# Patient Record
Sex: Male | Born: 1945 | Race: Black or African American | Hispanic: No | Marital: Married | Smoking: Never smoker
Health system: Southern US, Community
[De-identification: ages and names within clinical notes are randomized; demographics above are authoritative.]

## PROBLEM LIST (undated history)

## (undated) DIAGNOSIS — E785 Hyperlipidemia, unspecified: Secondary | ICD-10-CM

## (undated) DIAGNOSIS — C61 Malignant neoplasm of prostate: Secondary | ICD-10-CM

## (undated) DIAGNOSIS — I10 Essential (primary) hypertension: Secondary | ICD-10-CM

## (undated) HISTORY — PX: UMBILICAL HERNIA REPAIR: SHX196

## (undated) HISTORY — PX: COLONOSCOPY: SHX174

## (undated) HISTORY — DX: Hyperlipidemia, unspecified: E78.5

## (undated) HISTORY — DX: Malignant neoplasm of prostate: C61

## (undated) HISTORY — DX: Essential (primary) hypertension: I10

## (undated) HISTORY — PX: INGUINAL HERNIA REPAIR: SUR1180

---

## 1998-05-25 ENCOUNTER — Encounter: Admission: RE | Admit: 1998-05-25 | Discharge: 1998-05-25 | Payer: Self-pay | Admitting: *Deleted

## 2002-11-17 ENCOUNTER — Encounter: Payer: Self-pay | Admitting: Emergency Medicine

## 2002-11-17 ENCOUNTER — Emergency Department (HOSPITAL_COMMUNITY): Admission: EM | Admit: 2002-11-17 | Discharge: 2002-11-17 | Payer: Self-pay | Admitting: Emergency Medicine

## 2004-10-12 ENCOUNTER — Ambulatory Visit: Payer: Self-pay | Admitting: Internal Medicine

## 2004-11-05 ENCOUNTER — Ambulatory Visit: Payer: Self-pay | Admitting: Internal Medicine

## 2004-11-18 ENCOUNTER — Ambulatory Visit: Payer: Self-pay | Admitting: Internal Medicine

## 2004-11-24 ENCOUNTER — Ambulatory Visit: Payer: Self-pay | Admitting: Internal Medicine

## 2004-12-04 ENCOUNTER — Ambulatory Visit (HOSPITAL_COMMUNITY): Admission: RE | Admit: 2004-12-04 | Discharge: 2004-12-04 | Payer: Self-pay | Admitting: Emergency Medicine

## 2004-12-07 ENCOUNTER — Ambulatory Visit: Payer: Self-pay | Admitting: Internal Medicine

## 2004-12-24 ENCOUNTER — Ambulatory Visit: Payer: Self-pay | Admitting: Internal Medicine

## 2005-05-02 ENCOUNTER — Ambulatory Visit: Payer: Self-pay | Admitting: Internal Medicine

## 2005-05-16 ENCOUNTER — Ambulatory Visit: Payer: Self-pay | Admitting: Internal Medicine

## 2005-08-09 ENCOUNTER — Ambulatory Visit: Payer: Self-pay | Admitting: Internal Medicine

## 2005-09-19 ENCOUNTER — Ambulatory Visit: Payer: Self-pay | Admitting: Internal Medicine

## 2005-11-11 ENCOUNTER — Ambulatory Visit: Payer: Self-pay | Admitting: Internal Medicine

## 2005-11-21 ENCOUNTER — Ambulatory Visit: Payer: Self-pay | Admitting: Internal Medicine

## 2005-12-12 DIAGNOSIS — J9601 Acute respiratory failure with hypoxia: Secondary | ICD-10-CM | POA: Diagnosis present

## 2005-12-12 DIAGNOSIS — C61 Malignant neoplasm of prostate: Secondary | ICD-10-CM

## 2005-12-12 HISTORY — DX: Malignant neoplasm of prostate: C61

## 2006-01-17 ENCOUNTER — Ambulatory Visit: Payer: Self-pay | Admitting: Internal Medicine

## 2006-01-31 ENCOUNTER — Ambulatory Visit: Payer: Self-pay | Admitting: Internal Medicine

## 2006-02-01 ENCOUNTER — Ambulatory Visit (HOSPITAL_COMMUNITY): Admission: RE | Admit: 2006-02-01 | Discharge: 2006-02-01 | Payer: Self-pay | Admitting: Urology

## 2006-04-11 HISTORY — PX: PROSTATECTOMY: SHX69

## 2006-04-17 ENCOUNTER — Ambulatory Visit: Payer: Self-pay | Admitting: Internal Medicine

## 2006-06-07 ENCOUNTER — Encounter (INDEPENDENT_AMBULATORY_CARE_PROVIDER_SITE_OTHER): Payer: Self-pay | Admitting: *Deleted

## 2006-06-07 ENCOUNTER — Inpatient Hospital Stay (HOSPITAL_COMMUNITY): Admission: RE | Admit: 2006-06-07 | Discharge: 2006-06-08 | Payer: Self-pay | Admitting: Urology

## 2006-08-31 ENCOUNTER — Ambulatory Visit: Payer: Self-pay | Admitting: Internal Medicine

## 2006-09-12 ENCOUNTER — Ambulatory Visit: Payer: Self-pay | Admitting: Internal Medicine

## 2006-11-07 ENCOUNTER — Ambulatory Visit: Payer: Self-pay | Admitting: Internal Medicine

## 2006-12-27 ENCOUNTER — Ambulatory Visit: Payer: Self-pay | Admitting: Internal Medicine

## 2006-12-27 LAB — CONVERTED CEMR LAB
ALT: 25 units/L (ref 0–40)
AST: 23 units/L (ref 0–37)
BUN: 18 mg/dL (ref 6–23)
Cholesterol: 173 mg/dL (ref 0–200)
Creatinine, Ser: 1.4 mg/dL (ref 0.4–1.5)
Creatinine,U: 247.7 mg/dL
HDL: 43 mg/dL (ref >39.0)
Hgb A1c MFr Bld: 6.5 % — ABNORMAL HIGH (ref 4.6–6.0)
LDL Cholesterol: 120 mg/dL — ABNORMAL HIGH (ref 0–99)
Microalb Creat Ratio: 1.6 mg/g (ref 0.0–30.0)
Microalb, Ur: 0.4 mg/dL (ref 0.0–1.9)
Potassium: 4 meq/L (ref 3.5–5.1)
Total CHOL/HDL Ratio: 4
Triglycerides: 51 mg/dL (ref 0–149)
VLDL: 10 mg/dL (ref 0–40)

## 2007-01-23 ENCOUNTER — Ambulatory Visit: Payer: Self-pay | Admitting: Internal Medicine

## 2007-05-01 ENCOUNTER — Ambulatory Visit: Payer: Self-pay | Admitting: Internal Medicine

## 2007-05-01 LAB — CONVERTED CEMR LAB
ALT: 24 units/L (ref 0–40)
AST: 22 units/L (ref 0–37)
BUN: 16 mg/dL (ref 6–23)
Cholesterol: 191 mg/dL (ref 0–200)
Creatinine, Ser: 1.3 mg/dL (ref 0.4–1.5)
Creatinine,U: 178.6 mg/dL
HDL: 41.2 mg/dL (ref >39.0)
Hgb A1c MFr Bld: 6.4 % — ABNORMAL HIGH (ref 4.6–6.0)
LDL Cholesterol: 139 mg/dL — ABNORMAL HIGH (ref 0–99)
Microalb Creat Ratio: 3.4 mg/g (ref 0.0–30.0)
Microalb, Ur: 0.6 mg/dL (ref 0.0–1.9)
Potassium: 4 meq/L (ref 3.5–5.1)
Total CHOL/HDL Ratio: 4.6
Triglycerides: 52 mg/dL (ref 0–149)
VLDL: 10 mg/dL (ref 0–40)

## 2007-05-08 ENCOUNTER — Encounter: Payer: Self-pay | Admitting: Internal Medicine

## 2007-05-08 ENCOUNTER — Ambulatory Visit: Payer: Self-pay | Admitting: Internal Medicine

## 2007-05-08 DIAGNOSIS — E785 Hyperlipidemia, unspecified: Secondary | ICD-10-CM

## 2007-05-21 ENCOUNTER — Telehealth (INDEPENDENT_AMBULATORY_CARE_PROVIDER_SITE_OTHER): Payer: Self-pay | Admitting: *Deleted

## 2007-08-09 ENCOUNTER — Ambulatory Visit: Payer: Self-pay | Admitting: Internal Medicine

## 2007-08-09 DIAGNOSIS — E119 Type 2 diabetes mellitus without complications: Secondary | ICD-10-CM | POA: Insufficient documentation

## 2007-08-13 LAB — CONVERTED CEMR LAB
Creatinine,U: 215.5 mg/dL
Hgb A1c MFr Bld: 6.3 % — ABNORMAL HIGH (ref 4.6–6.0)
Microalb Creat Ratio: 2.3 mg/g (ref 0.0–30.0)
Microalb, Ur: 0.5 mg/dL (ref 0.0–1.9)

## 2007-08-14 ENCOUNTER — Encounter (INDEPENDENT_AMBULATORY_CARE_PROVIDER_SITE_OTHER): Payer: Self-pay | Admitting: *Deleted

## 2007-08-15 ENCOUNTER — Ambulatory Visit: Payer: Self-pay | Admitting: Internal Medicine

## 2007-08-16 ENCOUNTER — Encounter (INDEPENDENT_AMBULATORY_CARE_PROVIDER_SITE_OTHER): Payer: Self-pay | Admitting: *Deleted

## 2007-09-07 ENCOUNTER — Encounter: Payer: Self-pay | Admitting: Internal Medicine

## 2007-09-10 ENCOUNTER — Encounter: Payer: Self-pay | Admitting: Internal Medicine

## 2007-09-25 ENCOUNTER — Ambulatory Visit: Payer: Self-pay | Admitting: Internal Medicine

## 2007-10-01 ENCOUNTER — Encounter: Payer: Self-pay | Admitting: Internal Medicine

## 2007-12-24 ENCOUNTER — Encounter: Payer: Self-pay | Admitting: Internal Medicine

## 2008-03-06 ENCOUNTER — Encounter: Payer: Self-pay | Admitting: Internal Medicine

## 2008-03-10 ENCOUNTER — Ambulatory Visit: Payer: Self-pay | Admitting: Internal Medicine

## 2008-03-20 ENCOUNTER — Telehealth (INDEPENDENT_AMBULATORY_CARE_PROVIDER_SITE_OTHER): Payer: Self-pay | Admitting: *Deleted

## 2008-03-24 ENCOUNTER — Ambulatory Visit: Payer: Self-pay | Admitting: Internal Medicine

## 2008-03-25 ENCOUNTER — Encounter (INDEPENDENT_AMBULATORY_CARE_PROVIDER_SITE_OTHER): Payer: Self-pay | Admitting: *Deleted

## 2008-03-25 ENCOUNTER — Telehealth (INDEPENDENT_AMBULATORY_CARE_PROVIDER_SITE_OTHER): Payer: Self-pay | Admitting: *Deleted

## 2008-06-04 ENCOUNTER — Encounter: Payer: Self-pay | Admitting: Internal Medicine

## 2008-08-29 ENCOUNTER — Encounter: Payer: Self-pay | Admitting: Internal Medicine

## 2008-09-26 ENCOUNTER — Encounter: Payer: Self-pay | Admitting: Internal Medicine

## 2008-09-30 ENCOUNTER — Ambulatory Visit: Payer: Self-pay | Admitting: Internal Medicine

## 2008-12-24 ENCOUNTER — Encounter: Payer: Self-pay | Admitting: Internal Medicine

## 2009-01-07 ENCOUNTER — Encounter: Payer: Self-pay | Admitting: Internal Medicine

## 2009-01-23 ENCOUNTER — Ambulatory Visit: Payer: Self-pay | Admitting: Internal Medicine

## 2009-01-23 DIAGNOSIS — Z8546 Personal history of malignant neoplasm of prostate: Secondary | ICD-10-CM | POA: Insufficient documentation

## 2009-01-23 LAB — CONVERTED CEMR LAB
Basophils Absolute: 0.1 10*3/uL (ref 0.0–0.1)
Basophils Relative: 0.8 % (ref 0.0–3.0)
Eosinophils Absolute: 0.1 10*3/uL (ref 0.0–0.7)
Eosinophils Relative: 1.8 % (ref 0.0–5.0)
HCT: 42.4 % (ref 39.0–52.0)
Hemoglobin: 14.6 g/dL (ref 13.0–17.0)
Lymphocytes Relative: 30.3 % (ref 12.0–46.0)
MCHC: 34.3 g/dL (ref 30.0–36.0)
MCV: 93 fL (ref 78.0–100.0)
Monocytes Absolute: 0.7 10*3/uL (ref 0.1–1.0)
Monocytes Relative: 11.5 % (ref 3.0–12.0)
Neutro Abs: 3.6 10*3/uL (ref 1.4–7.7)
Neutrophils Relative %: 55.6 % (ref 43.0–77.0)
Platelets: 288 10*3/uL (ref 150–400)
RBC: 4.56 M/uL (ref 4.22–5.81)
RDW: 13 % (ref 11.5–14.6)
WBC: 6.5 10*3/uL (ref 4.5–10.5)

## 2009-01-26 ENCOUNTER — Encounter (INDEPENDENT_AMBULATORY_CARE_PROVIDER_SITE_OTHER): Payer: Self-pay | Admitting: *Deleted

## 2009-02-04 ENCOUNTER — Ambulatory Visit: Payer: Self-pay | Admitting: Internal Medicine

## 2009-02-04 LAB — CONVERTED CEMR LAB
OCCULT 1: NEGATIVE
OCCULT 2: NEGATIVE
OCCULT 3: NEGATIVE

## 2009-02-05 ENCOUNTER — Encounter (INDEPENDENT_AMBULATORY_CARE_PROVIDER_SITE_OTHER): Payer: Self-pay | Admitting: *Deleted

## 2009-04-23 ENCOUNTER — Ambulatory Visit: Payer: Self-pay | Admitting: Internal Medicine

## 2009-04-23 DIAGNOSIS — D179 Benign lipomatous neoplasm, unspecified: Secondary | ICD-10-CM | POA: Insufficient documentation

## 2009-04-26 LAB — CONVERTED CEMR LAB
Hgb A1c MFr Bld: 6.2 % (ref 4.6–6.5)
Microalb, Ur: 0.4 mg/dL (ref 0.0–1.9)

## 2009-04-27 ENCOUNTER — Encounter (INDEPENDENT_AMBULATORY_CARE_PROVIDER_SITE_OTHER): Payer: Self-pay | Admitting: *Deleted

## 2009-07-09 ENCOUNTER — Telehealth (INDEPENDENT_AMBULATORY_CARE_PROVIDER_SITE_OTHER): Payer: Self-pay | Admitting: *Deleted

## 2009-08-25 ENCOUNTER — Telehealth: Payer: Self-pay | Admitting: Internal Medicine

## 2009-08-25 ENCOUNTER — Ambulatory Visit: Payer: Self-pay | Admitting: Internal Medicine

## 2009-08-27 ENCOUNTER — Encounter (INDEPENDENT_AMBULATORY_CARE_PROVIDER_SITE_OTHER): Payer: Self-pay | Admitting: *Deleted

## 2009-09-07 ENCOUNTER — Telehealth: Payer: Self-pay | Admitting: Internal Medicine

## 2009-09-09 ENCOUNTER — Ambulatory Visit: Payer: Self-pay | Admitting: Internal Medicine

## 2009-09-18 ENCOUNTER — Encounter: Payer: Self-pay | Admitting: Internal Medicine

## 2009-12-15 ENCOUNTER — Ambulatory Visit: Payer: Self-pay | Admitting: Internal Medicine

## 2009-12-17 ENCOUNTER — Encounter (INDEPENDENT_AMBULATORY_CARE_PROVIDER_SITE_OTHER): Payer: Self-pay | Admitting: *Deleted

## 2009-12-17 LAB — CONVERTED CEMR LAB
ALT: 22 units/L (ref 0–53)
AST: 22 units/L (ref 0–37)
Bilirubin, Direct: 0.1 mg/dL (ref 0.0–0.3)
Cholesterol: 163 mg/dL (ref 0–200)
Creatinine,U: 205.5 mg/dL
LDL Cholesterol: 113 mg/dL — ABNORMAL HIGH (ref 0–99)
Microalb, Ur: 0.7 mg/dL (ref 0.0–1.9)
Potassium: 3.8 meq/L (ref 3.5–5.1)
Total Bilirubin: 0.8 mg/dL (ref 0.3–1.2)
Total CHOL/HDL Ratio: 4
Triglycerides: 50 mg/dL (ref 0.0–149.0)
VLDL: 10 mg/dL (ref 0.0–40.0)

## 2009-12-29 ENCOUNTER — Ambulatory Visit: Payer: Self-pay | Admitting: Internal Medicine

## 2010-01-14 ENCOUNTER — Ambulatory Visit: Payer: Self-pay | Admitting: Internal Medicine

## 2010-02-22 ENCOUNTER — Encounter: Payer: Self-pay | Admitting: Internal Medicine

## 2010-03-17 ENCOUNTER — Encounter: Payer: Self-pay | Admitting: Internal Medicine

## 2010-03-19 ENCOUNTER — Encounter: Payer: Self-pay | Admitting: Internal Medicine

## 2010-04-01 ENCOUNTER — Ambulatory Visit: Payer: Self-pay | Admitting: Internal Medicine

## 2010-04-05 LAB — CONVERTED CEMR LAB
ALT: 22 units/L (ref 0–53)
Alkaline Phosphatase: 66 units/L (ref 39–117)
Bilirubin, Direct: 0 mg/dL (ref 0.0–0.3)
HDL: 43.8 mg/dL (ref >39.00)
Total Protein: 6.6 g/dL (ref 6.0–8.3)
Triglycerides: 53 mg/dL (ref 0.0–149.0)
VLDL: 10.6 mg/dL (ref 0.0–40.0)

## 2010-04-21 ENCOUNTER — Ambulatory Visit: Payer: Self-pay | Admitting: Internal Medicine

## 2010-09-07 ENCOUNTER — Ambulatory Visit: Payer: Self-pay | Admitting: Internal Medicine

## 2010-09-14 ENCOUNTER — Ambulatory Visit: Payer: Self-pay | Admitting: Internal Medicine

## 2010-09-16 LAB — CONVERTED CEMR LAB: Hgb A1c MFr Bld: 6.5 % (ref 4.6–6.5)

## 2010-09-22 ENCOUNTER — Encounter: Payer: Self-pay | Admitting: Internal Medicine

## 2010-10-22 ENCOUNTER — Ambulatory Visit: Payer: Self-pay | Admitting: Internal Medicine

## 2010-10-22 ENCOUNTER — Encounter (INDEPENDENT_AMBULATORY_CARE_PROVIDER_SITE_OTHER): Payer: Self-pay | Admitting: *Deleted

## 2010-10-22 DIAGNOSIS — I10 Essential (primary) hypertension: Secondary | ICD-10-CM | POA: Insufficient documentation

## 2010-10-22 DIAGNOSIS — L989 Disorder of the skin and subcutaneous tissue, unspecified: Secondary | ICD-10-CM | POA: Insufficient documentation

## 2010-11-29 ENCOUNTER — Telehealth: Payer: Self-pay | Admitting: Internal Medicine

## 2010-12-27 ENCOUNTER — Encounter: Payer: Self-pay | Admitting: Internal Medicine

## 2010-12-28 ENCOUNTER — Ambulatory Visit
Admission: RE | Admit: 2010-12-28 | Discharge: 2010-12-28 | Payer: Self-pay | Source: Home / Self Care | Attending: Internal Medicine | Admitting: Internal Medicine

## 2011-01-07 ENCOUNTER — Encounter: Payer: Self-pay | Admitting: Internal Medicine

## 2011-01-11 ENCOUNTER — Encounter: Payer: Self-pay | Admitting: Internal Medicine

## 2011-01-11 NOTE — Assessment & Plan Note (Signed)
Summary: 3 MTH FU/NS/KDC   Vital Signs:  Patient profile:   65 year old male Weight:      217 pounds Pulse rate:   80 / minute Resp:     14 per minute BP sitting:   126 / 80  (left arm) Cuff size:   large  Vitals Entered By: Shonna Chock (Apr 21, 2010 8:04 AM) CC: 3 Month follow-up  Comments REVIEWED MED LIST, PATIENT AGREED DOSE AND INSTRUCTION CORRECT    CC:  3 Month follow-up .  History of Present Illness: Labs reviewed & risks discussed. FBS 107-115; 2 hrs after meals 125-145. No hypoglycemia . Weight stable; he avoids hyperglycemic carbs. CVE as yardwork , calisthentics & walking.  Allergies (verified): No Known Drug Allergies  Review of Systems General:  Denies fatigue and weight loss. Eyes:  No retinopathy in 12/2009. CV:  Denies chest pain or discomfort, leg cramps with exertion, lightheadness, and near fainting. Derm:  Denies poor wound healing. Neuro:  Denies numbness and tingling. Endo:  Denies excessive hunger, excessive thirst, and excessive urination.  Physical Exam  General:  Thin but well-nourished; alert,appropriate and cooperative throughout examination Lungs:  Normal respiratory effort, chest expands symmetrically. Lungs are clear to auscultation, no crackles or wheezes. Heart:  Normal rate and regular rhythm. S1 and S2 normal without gallop, murmur, click, rub.S4 Pulses:  R and L carotid,radial,dorsalis pedis and posterior tibial pulses are full and equal bilaterally Extremities:  No clubbing, cyanosis, edema. Good nail health Neurologic:  alert & oriented X3 and sensation intact to light touch over feet.   Skin:  Intact without suspicious lesions or rashes Psych:  Focused & intelligent   Impression & Recommendations:  Problem # 1:  DIABETES-TYPE 2 (ICD-250.00) Excellent control His updated medication list for this problem includes:    Metformin Hcl 500 Mg Tabs (Metformin hcl) .Marland Kitchen... 1 with b'fast & 2 with eve meal    Lisinopril 20 Mg Tabs  (Lisinopril) .Marland Kitchen... 1 once daily if bp averages > 140/90. report tongue or lip swelling immediately  Problem # 2:  ELEVATED BLOOD PRESSURE WITHOUT DIAGNOSIS OF HYPERTENSION (ICD-796.2)  His updated medication list for this problem includes:    Lisinopril 20 Mg Tabs (Lisinopril) .Marland Kitchen... 1 once daily if bp averages > 140/90. report tongue or lip swelling immediately  Problem # 3:  HYPERLIPIDEMIA NEC/NOS (ICD-272.4) Lipids @ goal His updated medication list for this problem includes:    Niaspan 1000 Mg Tbcr (Niacin (antihyperlipidemic)) .Marland Kitchen... Take 1 tablet by mouth once a day    Pravastatin Sodium 40 Mg Tabs (Pravastatin sodium) .Marland Kitchen... 1/2 tab qhs  Complete Medication List: 1)  Niaspan 1000 Mg Tbcr (Niacin (antihyperlipidemic)) .... Take 1 tablet by mouth once a day 2)  Metformin Hcl 500 Mg Tabs (Metformin hcl) .Marland KitchenMarland Kitchen. 1 with b'fast & 2 with eve meal 3)  Pravastatin Sodium 40 Mg Tabs (Pravastatin sodium) .... 1/2 tab qhs 4)  Levitra 20 Mg Tabs (Vardenafil hcl) .... 1/2-1 once daily prn 5)  Folic Acid 800 Mg  .Marland KitchenMarland Kitchen. 1 by mouth once daily 6)  Fish Oil 1000 Mg  .Marland KitchenMarland Kitchen. 1 by mouth qd 7)  Vitamin C  .... 1000 mg qd 8)  One Touch Ultra Lancets Misc (lancets) & Test Strips  .... Use as directed 9)  Freestyle Test Strp (Glucose blood) .... Use as directed 10)  Cod Liver Oil Oil (Cod liver oil) .Marland Kitchen.. 1 by mouth once daily 11)  Lisinopril 20 Mg Tabs (Lisinopril) .Marland Kitchen.. 1 once daily if  bp averages > 140/90. report tongue or lip swelling immediately  Other Orders: Tdap => 14yrs IM ([address]) Admin 1st Vaccine (95621)  Patient Instructions: 1)  Consume LESS THAN 40 grams of sugar/ day from foods & drinks with High Fructose Corn Syrup as #1,2 or # 3 on label. 2)  Please schedule a follow-up appointment in 6 months. 3)  HbgA1C prior to visit, ICD-9:250.00 4)  Urine Microalbumin prior to visit, ICD-9:250.00 5)  Check your blood sugars regularly. If your readings are usually above : 150 or below90 OR 2 hrs after meal >  180  you should contact our office. 6)  See your eye doctor yearly to check for diabetic eye damage. 7)  Check your feet each night for sore areas, calluses or signs of infection. 8)  It is important that you exercise regularly at least 20 minutes 5 times a week. If you develop chest pain, have severe difficulty breathing, or feel very tired , stop exercising immediately and seek medical attention. 9)  Take an Aspirin every day.   Immunizations Administered:  Tetanus Vaccine:    Vaccine Type: Tdap    Site: right deltoid    Mfr: GlaxoSmithKline    Dose: 0.5 ml    Route: IM    Given by: Chrae Malloy    Exp. Date: 03/05/2012    Lot #: [id number]

## 2011-01-11 NOTE — Assessment & Plan Note (Signed)
Summary: FOLLOWUP ON LABS/KN   Vital Signs:  Patient profile:   65 year old male Height:      73 inches Weight:      220 pounds BMI:     29.13 Pulse rate:   76 / minute Resp:     15 per minute BP sitting:   140 / 92  (left arm) Cuff size:   large  Vitals Entered By: Shonna Chock CMA (October 22, 2010 9:10 AM) CC: Follow-up visit: discuss bloodsugars and labs , Type 2 diabetes mellitus follow-up, Rash   CC:  Follow-up visit: discuss bloodsugars and labs , Type 2 diabetes mellitus follow-up, and Rash.  History of Present Illness: Type 2 Diabetes Mellitus Follow-Up      This is a 65 year old man who presents for Type 2 diabetes mellitus follow-up.  The patient denies polyuria, polydipsia, blurred vision, self managed hypoglycemia, weight loss, weight gain, and numbness of extremities.  The patient denies the following symptoms: neuropathic pain, chest pain, vomiting, orthostatic symptoms, poor wound healing, intermittent claudication, vision loss, and foot ulcer.  Since the last visit the patient reports  slight decrease in  dietary compliance(increased  ice cream).  The patient has been measuring capillary blood glucose before breakfast ( 90-117)and 2 hrs  after dinner < 140.  Since the last visit, the patient reports having had eye care by an ophthalmologist, no retinopathy..   Hypertension Follow-Up      The patient also presents for Hypertension follow-up.  The patient denies lightheadedness, headaches, edema, and fatigue.  The patient denies the following associated symptoms: dyspnea, palpitations, and syncope.  Compliance with medications (by patient report) has been near 100%.  The patient reports exercising daily.  Adjunctive measures currently used by the patient include salt restriction.  See BP ; BP @ home 120-90-92. Lisinopril taken as needed only ,if BP a > 140/90 Skin lesion:      The patient also presents with hyperpigmented lesion RUE X 7-8 months. No injury other than his  scratching due to pruritis , no itching now for 2-3 months. 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 Lungs are clear to auscultation, no crackles or wheezes. Heart:  Normal rate and regular rhythm. S1 and S2 normal without gallop, murmur, click, rub . Pulses:  R and L carotid,radial,dorsalis pedis and posterior tibial pulses are full and equal bilaterally Extremities:  No clubbing, cyanosis, edema, or deformity noted . Good nail health Neurologic:  alert & oriented X3 and sensation intact to light touch over feet.   Skin:  3X2 flat , irregular hyperpigmented lesion R upper arm Cervical Nodes:  No lymphadenopathy noted Axillary Nodes:  No palpable lymphadenopathy Psych:  memory intact for recent and remote, normally interactive, and good eye contact.     Impression & Recommendations:  Problem # 1:  DIABETES-TYPE 2 (ICD-250.00) recent dietary indiscretion with rise in A1c His updated medication list for this problem includes:    Metformin Hcl 500 Mg Tabs (Metformin hcl) .Marland Kitchen... 1 with b'fast & 2 with eve meal    Lisinopril 20 Mg Tabs (Lisinopril) .Marland Kitchen... 1 once daily  Problem # 2:  HYPERTENSION (ICD-401.9)  His updated medication list for this problem includes:    Lisinopril 20 Mg Tabs (Lisinopril) .Marland Kitchen... 1 once daily  Problem #  3:  SKIN LESION (ICD-709.9)  Orders: Dermatology Referral (Derma)  Complete Medication List: 1)  Niaspan 1000 Mg Tbcr (Niacin (antihyperlipidemic)) .... Take 1 tablet by mouth once a day 2)  Metformin Hcl 500 Mg Tabs (Metformin hcl) .Marland KitchenMarland Kitchen. 1 with b'fast & 2 with eve meal 3)  Pravastatin Sodium 40 Mg Tabs (Pravastatin sodium) .... 1/2 tab qhs 4)  Levitra 20 Mg Tabs (Vardenafil hcl) .... 1/2-1 once daily prn 5)  Folic Acid  800 Mg  .Marland KitchenMarland Kitchen. 1 by mouth once daily 6)  Fish Oil 1000 Mg  .Marland KitchenMarland Kitchen. 1 by mouth qd 7)  Vitamin C  .... 1000 mg qd 8)  One Touch Ultra Lancets Misc (lancets) & Test Strips  .... Use as directed 9)  Cod Liver Oil Oil (Cod liver oil) .Marland Kitchen.. 1 by mouth once daily 10)  Lisinopril 20 Mg Tabs (Lisinopril) .Marland Kitchen.. 1 once daily  Patient Instructions: 1)  Avoid sweets as discussed. 2)  Please schedule a follow-up appointment in 4 months. 3)  Check your Blood Pressure regularly. Your goal = AVERAGE < 135/85. 4)  BUN,creat, K+ prior to visit, ICD-9:401.9 5)  Hepatic Panel prior to visit, ICD-9:995.20 6)  Lipid Panel prior to visit, ICD-9:272.4 7)  HbgA1C prior to visit, ICD-9:250.00 8)  Urine Microalbumin prior to visit, ICD-9:250.00 Prescriptions: LISINOPRIL 20 MG TABS (LISINOPRIL) 1 once daily  #90 x 3   Entered and Authorized by:   Marga Melnick MD   Signed by:   Marga Melnick MD on 10/22/2010   Method used:   Printed then faxed to ...       [facility]  [address] [phone]* (retail)       [address]       [place], [place]  [address]       Ph: [phone]       Fax: [phone]   RxID:   [id number]    Orders Added: 1)  Est. Patient Level IV [25427] 2)  Dermatology Referral [Derma]

## 2011-01-11 NOTE — Medication Information (Signed)
Summary: Nonadherence with Metformin/[facility]  Nonadherence with Metformin/[facility]   Imported By: Lanelle Bal 03/01/2010 09:30:23  _____________________________________________________________________  External Attachment:    Type:   Image     Comment:   External Document

## 2011-01-11 NOTE — Medication Information (Signed)
Summary: Nonadherence with Lisinopril/[facility]  Nonadherence with Lisinopril/[facility]   Imported By: Lanelle Bal 03/23/2010 10:42:16  _____________________________________________________________________  External Attachment:    Type:   Image     Comment:   External Document

## 2011-01-11 NOTE — Letter (Signed)
Summary: Primary Care Consult Scheduled Letter  [facility] [place]  [address] [place], [place] [address]   Phone: [phone]  Fax: [phone]      10/22/2010 MRN: [id number]  Alfred Foster [address] [place], [place]  [address]    Dear Mr. Fleishman,    We have scheduled an appointment for you.  At the recommendation of Dr. Marga Melnick, we have scheduled you a consult with Dr. Donzetta Starch of [facility] on 11-16-2010 arrive by 2:25PM.  Their address is [address] [place], [place] [address]. The office phone number is [phone].  If this appointment day and time is not convenient for you, please feel free to call the office of the doctor you are being referred to at the number listed above and reschedule the appointment.    It is important for you to keep your scheduled appointments. We are here to make sure you are given good patient care.   Thank you,    Renee, Patient Care Coordinator [place] [facility]

## 2011-01-11 NOTE — Assessment & Plan Note (Signed)
Summary: cough, congestion- jr   Vital Signs:  Patient profile:   65 year old male Weight:      222 pounds Temp:     97.5 degrees F oral Pulse rate:   88 / minute Resp:     16 per minute BP sitting:   148 / 90  (left arm) Cuff size:   large  Vitals Entered By: Shonna Chock (January 14, 2010 10:23 AM) CC: Cough, congestion, & sinus issues x 3-5 days Comments REVIEWED MED LIST, PATIENT AGREED DOSE AND INSTRUCTION CORRECT    CC:  Cough, congestion, and & sinus issues x 3-5 days.  History of Present Illness: Onset 1 week as slight hoarseness, nasal congestion & NP cough. Rx: fluids.FBS 110-120.  Allergies (verified): No Known Drug Allergies  Review of Systems General:  Denies chills and sweats; ? low grade fever to ?Marland Kitchen Eyes:  Denies discharge, eye pain, red eye, and vision loss-both eyes. ENT:  Denies ear discharge, earache, and sinus pressure; No facial pain , frontal headache, or purulence. Intermittent nasal congestion.Marland Kitchen Resp:  Complains of shortness of breath and wheezing; denies sputum productive; no PMH of asthma. Allergy:  Minor sneezing.  Physical Exam  General:  well-nourished,in no acute distress; alert,appropriate and cooperative throughout examination Ears:  External ear exam shows no significant lesions or deformities.  Otoscopic examination reveals clear canals, tympanic membranes are intact bilaterally without bulging, retraction, inflammation or discharge. Hearing is grossly normal bilaterally. Nose:  External nasal examination shows no deformity or inflammation. Nasal mucosa are pink and moist without lesions or exudates. Slight hyponasal speech Mouth:  Oral mucosa and oropharynx without lesions or exudates.  Teeth in good repair. Lungs:  Normal respiratory effort, chest expands symmetrically. Lungs are clear to auscultation, no crackles or wheezes. Cervical Nodes:  Shotty LA L> R Axillary Nodes:  No palpable lymphadenopathy   Impression &  Recommendations:  Problem # 1:  BRONCHITIS-ACUTE (ICD-466.0)  His updated medication list for this problem includes:    Azithromycin 250 Mg Tabs (Azithromycin) .Marland Kitchen... As per pack  Problem # 2:  DIABETES-TYPE 2 (ICD-250.00) Controlled His updated medication list for this problem includes:    Metformin Hcl 500 Mg Tabs (Metformin hcl) .Marland Kitchen..Marland Kitchen Two times a day with 2 largest meals    Lisinopril 20 Mg Tabs (Lisinopril) .Marland Kitchen... 1 once daily if bp averages > 140/90. report tongue or lip swelling immediately  Problem # 3:  ELEVATED BLOOD PRESSURE WITHOUT DIAGNOSIS OF HYPERTENSION (ICD-796.2)  His updated medication list for this problem includes:    Lisinopril 20 Mg Tabs (Lisinopril) .Marland Kitchen... 1 once daily if bp averages > 140/90. report tongue or lip swelling immediately  Complete Medication List: 1)  Niaspan 1000 Mg Tbcr (Niacin (antihyperlipidemic)) .... Take 1 tablet by mouth once a day 2)  Metformin Hcl 500 Mg Tabs (Metformin hcl) .... Two times a day with 2 largest meals 3)  Pravastatin Sodium 40 Mg Tabs (Pravastatin sodium) .... 1/2 tab qhs 4)  Levitra 20 Mg Tabs (Vardenafil hcl) .... 1/2-1 once daily prn 5)  Zaditor 0.025 % Soln (Ketotifen fumarate) .... Otc eye gtts 6)  Robitussin Dm Sugar Free  .... As needed cough 7)  Mucinex Dm  .... As needed cough 8)  Folic Acid 800 Mg  .Marland KitchenMarland Kitchen. 1 by mouth once daily 9)  Fish Oil 1000 Mg  .Marland KitchenMarland Kitchen. 1 by mouth qd 10)  Vitamin C  .... 1000 mg qd 11)  One Touch Ultra Lancets Misc (lancets) & Test Strips  .Marland KitchenMarland KitchenMarland Kitchen  Use as directed 12)  Freestyle Test Strp (Glucose blood) .... Use as directed 13)  Cod Liver Oil Oil (Cod liver oil) .Marland Kitchen.. 1 by mouth once daily 14)  Azithromycin 250 Mg Tabs (Azithromycin) .... As per pack 15)  Lisinopril 20 Mg Tabs (Lisinopril) .Marland Kitchen.. 1 once daily if bp averages > 140/90. report tongue or lip swelling immediately  Patient Instructions: 1)  Neti pot once daily as needed for congestion. Mucinex DM (NOT Mucinex D ) as needed for cough. 2)   Drink as much fluid as you can tolerate for the next few days. 3)  Check your Blood Pressure regularly. If it is above:  140/90 ON AVERAGE you should fill Lisinopril 20 mg once daily . Prescriptions: LISINOPRIL 20 MG TABS (LISINOPRIL) 1 once daily if BP averages > 140/90. Report tongue or lip swelling immediately  #30 x 5   Entered and Authorized by:   Marga Melnick MD   Signed by:   Marga Melnick MD on 01/14/2010   Method used:   Print then Give to Patient   RxID:   [id number] AZITHROMYCIN 250 MG TABS (AZITHROMYCIN) as per pack  #1 x 0   Entered and Authorized by:   Marga Melnick MD   Signed by:   Marga Melnick MD on 01/14/2010   Method used:   Faxed to ...       [facility]  [address] [phone]* (retail)       [address]       [place], [place]  [address]       Ph: [phone]       Fax: [phone]   RxID:   [id number]

## 2011-01-11 NOTE — Letter (Signed)
   [place] [facility] [address] [place], [place]  [address] Phone: [phone]      December 17, 2009   [facility] [address] [place], [place] [address]  RE:  LAB RESULTS  Dear  Mr. Galligan,  The following is an interpretation of your most recent lab tests.  Please take note of any instructions provided or changes to medications that have resulted from your lab work.  ELECTROLYTES:  Good - no changes needed  KIDNEY FUNCTION TESTS:  Good - no changes needed  LIVER FUNCTION TESTS:  Good - no changes needed  LIPID PANEL:  Good - no changes needed Triglyceride: 50.0   Cholesterol: 163   LDL: 113   HDL: 40.10   Chol/HDL%:  4  DIABETIC STUDIES:  Good - no changes needed HgbA1C: 6.4   Microalbumin/Creatinine Ratio: 3.4     Very good results EXCEPT MINIMAL LDL goal = < 100, ideal is < 70. Please see me before refilling meds. Bring glucose readings to that appt. [employer]

## 2011-01-11 NOTE — Letter (Signed)
Summary: [facility]  [facility]   Imported By: Lanelle Bal 04/05/2010 13:14:13  _____________________________________________________________________  External Attachment:    Type:   Image     Comment:   External Document

## 2011-01-11 NOTE — Assessment & Plan Note (Signed)
Summary: flu shot//kn   Nurse Visit  CC: Flu shot./kb   Allergies: No Known Drug Allergies  Orders Added: 1)  Admin 1st Vaccine [90471] 2)  Flu Vaccine 70yrs + [43[address]]       Flu Vaccine Consent Questions     Do you have a history of severe allergic reactions to this vaccine? no    Any prior history of allergic reactions to egg and/or gelatin? no    Do you have a sensitivity to the preservative Thimersol? no    Do you have a past history of Guillan-Barre Syndrome? no    Do you currently have an acute febrile illness? no    Have you ever had a severe reaction to latex? no    Vaccine information given and explained to patient? yes    Are you currently pregnant? no    Lot Number:[id number]   Exp Date:06/11/2011   Site Given  Left Deltoid IM Lucious Groves CMA  September 07, 2010 10:44 AM

## 2011-01-11 NOTE — Letter (Signed)
Summary: [facility]  [facility]   Imported By: Lanelle Bal 10/02/2010 10:38:26  _____________________________________________________________________  External Attachment:    Type:   Image     Comment:   External Document

## 2011-01-11 NOTE — Assessment & Plan Note (Signed)
Summary: 4 mth fu/ns/kdc   Vital Signs:  Patient profile:   65 year old male Weight:      220.8 pounds Pulse rate:   76 / minute Resp:     15 per minute BP sitting:   122 / 80  (left arm) Cuff size:   large  Vitals Entered By: Shonna Chock (December 29, 2009 8:53 AM) CC: 4 month follow-up Comments REVIEWED MED LIST, PATIENT AGREED DOSE AND INSTRUCTION CORRECT    CC:  4 month follow-up.  History of Present Illness: FBS 101-113; post meal 125-137. No hypoglycemia. Weight stable. No retinopathy last week.Labs reviewed & risks discussed. He did not recognize Pravastatin as active med.  Allergies (verified): No Known Drug Allergies  Review of Systems General:  Denies weight loss. Eyes:  Denies blurring, double vision, and vision loss-both eyes. CV:  Denies chest pain or discomfort, leg cramps with exertion, lightheadness, and near fainting. Derm:  Denies poor wound healing. Neuro:  Denies numbness and tingling. Endo:  Denies excessive hunger, excessive thirst, and excessive urination.  Physical Exam  General:  well-nourished,in no acute distress; alert,appropriate and cooperative throughout examination Heart:  Normal rate and regular rhythm. S1 and S2 normal without gallop, murmur, click, rub. S4 Pulses:  R and L carotid,radial,dorsalis pedis and posterior tibial pulses are full and equal bilaterally Extremities:  No clubbing, cyanosis, edema. Neurologic:  sensation intact to light touch over feet.   Skin:  Intact without suspicious lesions or rashes   Impression & Recommendations:  Problem # 1:  DIABETES-TYPE 2 (ICD-250.00) controlled His updated medication list for this problem includes:    Metformin Hcl 500 Mg Tabs (Metformin hcl) .Marland Kitchen..Marland Kitchen Two times a day with 2 largest meals  Problem # 2:  HYPERLIPIDEMIA NEC/NOS (ICD-272.4)  His updated medication list for this problem includes:    Niaspan 1000 Mg Tbcr (Niacin (antihyperlipidemic)) .Marland Kitchen... Take 1 tablet by mouth once a  day    Pravastatin Sodium 40 Mg Tabs (Pravastatin sodium) .Marland Kitchen... 1/2 tab qhs  Complete Medication List: 1)  Niaspan 1000 Mg Tbcr (Niacin (antihyperlipidemic)) .... Take 1 tablet by mouth once a day 2)  Metformin Hcl 500 Mg Tabs (Metformin hcl) .... Two times a day with 2 largest meals 3)  Pravastatin Sodium 40 Mg Tabs (Pravastatin sodium) .... 1/2 tab qhs 4)  Levitra 20 Mg Tabs (Vardenafil hcl) .... 1/2-1 once daily prn 5)  Zaditor 0.025 % Soln (Ketotifen fumarate) .... Otc eye gtts 6)  Robitussin Dm Sugar Free  .... As needed cough 7)  Mucinex Dm  .... As needed cough 8)  Folic Acid 800 Mg  .Marland KitchenMarland Kitchen. 1 by mouth once daily 9)  Fish Oil 1000 Mg  .Marland KitchenMarland Kitchen. 1 by mouth qd 10)  Vitamin C  .... 1000 mg qd 11)  One Touch Ultra Lancets Misc (lancets) & Test Strips  .... Use as directed 12)  Freestyle Test Strp (Glucose blood) .... Use as directed  Patient Instructions: 1)  Please schedule a follow-up appointment in 3 months. 2)  Hepatic Panel prior to visit, ICD-9:995.20 3)  Lipid Panel prior to visit, ICD-9:272.4 4)  HbgA1C prior to visit, ICD-9:250.00. Consume LESS THAN  40 grams of sugar / day from LABELED foods & drinks. Prescriptions: ONE TOUCH ULTRA LANCETS  MISC (LANCETS) & TEST STRIPS use as directed  #100 x 3   Entered and Authorized by:   Marga Melnick MD   Signed by:   Marga Melnick MD on 12/29/2009   Method used:  Print then Give to Patient   RxID:   [id number] PRAVASTATIN SODIUM 40 MG  TABS (PRAVASTATIN SODIUM) 1/2 tab qhs  #90 x 0   Entered and Authorized by:   Marga Melnick MD   Signed by:   Marga Melnick MD on 12/29/2009   Method used:   Print then Give to Patient   RxID:   [id number] NIASPAN 1000 MG TBCR (NIACIN (ANTIHYPERLIPIDEMIC)) Take 1 tablet by mouth once a day  #90 x 3   Entered and Authorized by:   Marga Melnick MD   Signed by:   Marga Melnick MD on 12/29/2009   Method used:   Print then Give to Patient   RxID:   [id number]

## 2011-01-13 NOTE — Progress Notes (Signed)
Summary: [employer] savings card  Phone Note Call from Patient Call back at Home Phone [phone]   Summary of Call: Patient called for [employer] savings card. I made him aware that the office currently does not have any, but he can use a home computer to print coupons from the company website. Patient expressed understanding.  Initial call taken by: Lucious Groves CMA,  November 29, 2010 12:06 PM     Appended Document: [employer] savings card Cards were found and patient aware it will be mailed to him.

## 2011-01-13 NOTE — Letter (Signed)
Summary: [facility]   Imported By: Lanelle Bal 01/07/2011 11:11:23  _____________________________________________________________________  External Attachment:    Type:   Image     Comment:   External Document

## 2011-01-13 NOTE — Assessment & Plan Note (Signed)
Summary: KNOT ON LEFT SIDE//PH   Vital Signs:  Patient profile:   65 year old male Weight:      222 pounds BMI:     29.40 Temp:     98.2 degrees F oral Pulse rate:   84 / minute Resp:     14 per minute BP sitting:   114 / 86  (right arm) Cuff size:   large  Vitals Entered By: Shonna Chock CMA (December 28, 2010 1:25 PM) CC: Examine abdominal area- knot on left side and pain off/on   CC:  Examine abdominal area- knot on left side and pain off/on.  History of Present Illness: L lateral abdominal  mass noted ?; it is mobile &  ?   larger according to his wife.Associated is  intermittent twinge of pain when his belt hits it. It  is not as apparent  after large meal.  Current Medications (verified): 1)  Niaspan 1000 Mg Tbcr (Niacin (Antihyperlipidemic)) .... Take 1 Tablet By Mouth Once A Day 2)  Metformin Hcl 500 Mg Tabs (Metformin Hcl) .Marland Kitchen.. 1 With B'fast & 2 With Eve Meal 3)  Pravastatin Sodium 40 Mg  Tabs (Pravastatin Sodium) .... 1/2 Tab Qhs 4)  Levitra 20 Mg  Tabs (Vardenafil Hcl) .... 1/2-1 Once Daily Prn 5)  Folic Acid 800 Mg .Marland Kitchen.. 1 By Mouth Once Daily 6)  Fish Oil 1000 Mg .Marland Kitchen.. 1 By Mouth Qd 7)  Vitamin C .... 1000 Mg Qd 8)  One Touch Ultra Lancets  Misc (Lancets) & Test Strips .... Use As Directed 9)  Cod Liver Oil  Oil (Cod Liver Oil) .Marland Kitchen.. 1 By Mouth Once Daily 10)  Lisinopril 20 Mg Tabs (Lisinopril) .Marland Kitchen.. 1 Once Daily  Allergies (verified): No Known Drug Allergies  Review of Systems General:  Denies chills, fever, sweats, and weight loss. GI:  Denies abdominal pain, bloody stools, change in bowel habits, constipation, dark tarry stools, diarrhea, gas, loss of appetite, nausea, and vomiting.  Physical Exam  General:  well-nourished,in no acute distress; alert,appropriate and cooperative throughout examination Abdomen:  Bowel sounds positive,abdomen soft and non-tender without masses, organomegaly or hernias noted. See Skin  Skin:  8.5X 5.5 cm lipoma LUQ  which  tranilluminates; it is not attached to Subcutaneous tissues Cervical Nodes:  No lymphadenopathy noted Axillary Nodes:  No palpable lymphadenopathy Inguinal Nodes:  No significant adenopathy   Impression & Recommendations:  Problem # 1:  LIPOMA (ICD-214.9)  ? enlarging   Orders: Surgical Referral (Surgery)  Complete Medication List: 1)  Niaspan 1000 Mg Tbcr (Niacin (antihyperlipidemic)) .... Take 1 tablet by mouth once a day 2)  Metformin Hcl 500 Mg Tabs (Metformin hcl) .Marland KitchenMarland Kitchen. 1 with b'fast & 2 with eve meal 3)  Pravastatin Sodium 40 Mg Tabs (Pravastatin sodium) .... 1/2 tab qhs 4)  Levitra 20 Mg Tabs (Vardenafil hcl) .... 1/2-1 once daily prn 5)  Folic Acid 800 Mg  .Marland KitchenMarland Kitchen. 1 by mouth once daily 6)  Fish Oil 1000 Mg  .Marland KitchenMarland Kitchen. 1 by mouth qd 7)  Vitamin C  .... 1000 mg qd 8)  One Touch Ultra Lancets Misc (lancets) & Test Strips  .... Use as directed 9)  Cod Liver Oil Oil (Cod liver oil) .Marland Kitchen.. 1 by mouth once daily 10)  Lisinopril 20 Mg Tabs (Lisinopril) .Marland Kitchen.. 1 once daily  Patient Instructions: 1)  Monitor for Warning Signs  as discussed.   Orders Added: 1)  Est. Patient Level III [16109] 2)  Surgical Referral [Surgery]

## 2011-01-27 NOTE — Medication Information (Signed)
[facility]   Imported By: Kassie Mends 01/20/2011 10:14:46  _____________________________________________________________________  External Attachment:    Type:   Image     Comment:   External Document

## 2011-02-08 NOTE — Consult Note (Signed)
Summary: [facility]   Imported By: Maryln Gottron 02/04/2011 10:55:32  _____________________________________________________________________  External Attachment:    Type:   Image     Comment:   External Document

## 2011-04-11 ENCOUNTER — Telehealth: Payer: Self-pay | Admitting: Internal Medicine

## 2011-04-11 ENCOUNTER — Other Ambulatory Visit: Payer: Self-pay | Admitting: Internal Medicine

## 2011-04-11 NOTE — Telephone Encounter (Signed)
Lipid/Hep 272.4/995.20  

## 2011-04-11 NOTE — Telephone Encounter (Signed)
Patient has fasting labs for Friday 05-13-11---what orders and codes do you want???    thanks

## 2011-04-12 NOTE — Telephone Encounter (Signed)
Added info to 5/4 labs

## 2011-04-15 ENCOUNTER — Other Ambulatory Visit (INDEPENDENT_AMBULATORY_CARE_PROVIDER_SITE_OTHER): Payer: BC Managed Care – PPO

## 2011-04-15 DIAGNOSIS — E785 Hyperlipidemia, unspecified: Secondary | ICD-10-CM

## 2011-04-15 DIAGNOSIS — T887XXA Unspecified adverse effect of drug or medicament, initial encounter: Secondary | ICD-10-CM

## 2011-04-15 LAB — LIPID PANEL
Cholesterol: 136 mg/dL (ref 0–200)
LDL Cholesterol: 92 mg/dL (ref 0–99)

## 2011-04-15 LAB — HEPATIC FUNCTION PANEL
ALT: 29 U/L (ref 0–53)
AST: 29 U/L (ref 0–37)
Alkaline Phosphatase: 66 U/L (ref 39–117)
Bilirubin, Direct: 0.1 mg/dL (ref 0.0–0.3)
Total Protein: 6.8 g/dL (ref 6.0–8.3)

## 2011-04-25 ENCOUNTER — Other Ambulatory Visit: Payer: Self-pay | Admitting: Internal Medicine

## 2011-04-29 NOTE — Discharge Summary (Signed)
[name], [name]                ACCOUNT NO.:  [id number]   MEDICAL RECORD NO.:  [id number]          PATIENT TYPE:  INP   LOCATION:  1406                         FACILITY:  [facility]   PHYSICIAN:  Heloise Purpura, MD      DATE OF BIRTH:  1946/07/08   DATE OF ADMISSION:  06/07/2006  DATE OF DISCHARGE:  06/08/2006                                 DISCHARGE SUMMARY   ADMISSION DIAGNOSIS:  Clinically localized adenocarcinoma of prostate.   DISCHARGE DIAGNOSIS:  Clinically localized adenocarcinoma of prostate.   PROCEDURES:  Robotic assisted laparoscopic radical prostatectomy.   HISTORY AND PHYSICAL:  For full details, please see admission history and  physical.  Briefly, Alfred Foster is a 65 year old gentleman with clinical stage  T1C prostate cancer with a PSA of 3.71 and a Gleason score 3+3=6.  He  underwent an negative metastatic evaluation with both a CT scan and bone  scan.  After discussing the options for clinically localized low risk  prostate cancer, [name] elected to proceed with surgical therapy and the  above procedure.   HOSPITAL COURSE:  The patient was admitted to the hospital on June 07, 2006,  and underwent an uneventful robotic assisted laparoscopic radical  prostatectomy.  Postoperatively, he was able to be transferred to a regular  hospital room following recovery from anesthesia.  On the evening of  postoperative day zero, he was able to begin ambulating which he did without  difficulty.  By postoperative day one, he was able to begin a clear liquid  diet and subsequently was transitioned to oral pain medication.  He  maintained excellent urine output with minimal output from his pelvic drain  and, therefore, his pelvic drain was able to be removed.  By the afternoon  of postoperative day one, he was doing well and had met all discharge  criteria was discharged home in excellent condition.   DISPOSITION:  Home.   DISCHARGE MEDICATIONS:  Mr. Gentle was instructed to  resume his regular home  medications except any aspirin, nonsteroidal anti-inflammatory drugs, and  herbal supplements.  He was given a prescription to take Vicodin as needed  for pain, Cipro as an antibiotic prior to catheter removal, and to use  Colace as a stool softener.   DISCHARGE INSTRUCTIONS:  He was instructed to gradually advance his diet  once passing flatus.  He was encouraged to be ambulatory but specifically  instructed to refrain from any heavy lifting or strenuous activity or  driving.  He was instructed on routine Foley catheter care and given a leg  bag for daytime usage.   FOLLOW UP:  [name] will return in one week for removal of his Foley  catheter and to review his surgical pathology in detail.           ______________________________  Heloise Purpura, MD  Electronically Signed     LB/MEDQ  D:  06/08/2006  T:  06/08/2006  Job:  [id number]   cc:   Titus Dubin. Alwyn Ren, M.D. [facility]  [phone] [place]  [place] [address]

## 2011-04-29 NOTE — Op Note (Signed)
Alfred Foster, Alfred Foster                ACCOUNT NO.:  [id number]   MEDICAL RECORD NO.:  [id number]          PATIENT TYPE:  INP   LOCATION:  1406                         FACILITY:  [facility]   PHYSICIAN:  Heloise Purpura, MD      DATE OF BIRTH:  [birth year]   DATE OF PROCEDURE:  06/07/2006  DATE OF DISCHARGE:                                 OPERATIVE REPORT   PREOPERATIVE DIAGNOSIS:  Clinically localized adenocarcinoma of prostate.   POSTOPERATIVE DIAGNOSIS:  Clinically localized adenocarcinoma of prostate.   PROCEDURE:  Robotic assisted laparoscopic radical prostatectomy (bilateral  nerve sparing).   SURGEON:  Heloise Purpura, M.D.   ASSISTANT:  [employer], M.D.  Terie Purser, M.D.   ANESTHESIA:  General.   COMPLICATIONS:  None.   ESTIMATED BLOOD LOSS:  300 mL.   INTRAVENOUS FLUIDS:  1500 mL of lactated Ringer's.   SPECIMEN:  Prostate and seminal vesicles.   DRAINS:  1.  20-French Coude catheter.  2.  19 Blake pelvic drain.   INDICATIONS:  Alfred Foster is a 65 year old gentleman with clinical stage T1C  prostate cancer with a PSA of 3.74 and Gleason score 3+3=6.  Preoperative  IPSS score was 1 with an IIEF score of 23.  After discussing management  options for clinically localized prostate cancer, the patient elected to  proceed with the above procedure.  The potential risks and benefits were  discussed with him and he consented.   DESCRIPTION OF PROCEDURE:  The patient was taken to the operating room and a  general anesthetic was administered.  He was given preoperative antibiotics  and placed in the dorsal lithotomy position, and prepped and draped in the  usual sterile fashion.  Next, a preoperative time out was performed.  A site  was then selected 18 cm from the pubic symphysis for placement of the camera  port.  A Foley catheter had been inserted into the bladder.  A 1 cm vertical  incision was then made at the site of the camera port just to the left of  the umbilicus.   Electrocautery was used to take down the subcutaneous tissue  down to the anterior rectus fascia which was then opened.  The rectus muscle  fibers were swept laterally to either side thereby exposing the posterior  rectus sheath.  A knife was then used to sharply incise the posterior rectus  sheath and peritoneum allowing entry into the peritoneal cavity under direct  vision.  A 12 mm port was then placed and pneumoperitoneum was established.  With the 0 degrees lens, the abdomen was inspected and there was no evidence  of any intra-abdominal injuries or other abnormalities.  The remaining ports  were then placed.  Bilateral 8 mm robotic ports were placed 16 cm from the  pubic symphysis and 10 cm lateral to the camera port.  An additional 8 mm  port was placed in the far left lateral abdominal wall.  A 5 mm port was  placed between the camera port and the right robotic port.  An additional 12  mm port was placed in the far  right lateral abdominal wall for laparoscopic  assistance.  All ports were placed under direct vision and without  difficulty.  The surgical cart was then docked.  With the aid of the cautery  scissors, the bladder was reflected posteriorly allowing entry into the  space of Retzius and identification of the endopelvic fascia and prostate.  The endopelvic fascia was then incised from the apex back to the base of the  prostate and the underlying levator muscle fibers were swept laterally off  the prostate.  This isolated the dorsal venous complex which was then  stapled and divided with a 45 mm flex ETS stapler.  Attention was then  turned to the bladder neck which was entered anteriorly, thereby exposing  the Foley catheter.  The Foley catheter balloon was then deflated and the  catheter was brought into the operative field and used to retract the  prostate anteriorly.  This exposed the posterior bladder neck which was then  incised and dissection continued posteriorly  until the vasa deferentia and  seminal vesicles were identified.  The vasa deferentia were isolated and  divided and then lifted anteriorly.  The seminal vesicles were similarly  isolated and then lifted anteriorly.  The space between the anterior rectum  and Denonvilliers fascia was then bluntly developed thereby isolating the  vascular pedicles of the prostate.  Attention was then turned to the  anterior aspect of the prostate.  The lateral prostatic fascia was incised  bilaterally allowing the neurovascular bundles to be swept laterally and  posteriorly off the prostate.  This isolated the vascular pedicles of the  prostate which were then ligated with Hem-o-Lock clips and sharply divided.  The neurovascular bundles were then swept off the prostate apically.  Attention was then turned to the urethra which was sharply divided allowing  the prostate specimen to be disarticulated and placed up into the abdomen.  The pelvis was then copiously irrigated.  There was noted to be a small  bleeding area just at the distal left aspect of the neurovascular bundle on  the left side.  A figure-of-eight 2-0 Vicryl suture was then used to provide  hemostasis.  With irrigation in the pelvis, air was injected into the rectal  catheter and there was no evidence of a rectal injury.  Attention was then  turned to the bladder neck.  A figure-of-eight 0 Vicryl suture was then used  to close the bladder neck at the 5 and 7 o'clock positions.  The ureteral  orifices were able to be easily identified and care was taken to preserve  them.  Attention was then turned to the urethral anastomosis.  A double  armed 3-0 Monocryl suture was then used to perform a 360 degree running  anastomosis between the bladder neck and urethra in a tension free fashion.  A new 20-French Coude catheter was then inserted into the bladder.  The  catheter was then irrigated and there were no blood clots within the bladder.  The  anastomosis appeared to be mostly watertight except for a  small amount of leakage posteriorly.  However, with the Foley catheter on  traction, there was no leakage.  A #19 Blake drain was then brought through  the left robotic port and appropriately positioned in the pelvis.  It was  secured to the skin with a nylon suture.  The prostate specimen was then  placed into the Endopouch retrieval bag via the periumbilical port site.  A  0 Vicryl suture was used  to close the right lateral 12 mm port site with the  aid of the [employer].  All ports were then removed under direct  vision.  The prostate specimen was then removed intact within the Endopouch  retrieval bag via the periumbilical port site.  This fascial opening was  then closed with a running 0 Vicryl suture.  0.25% Marcaine was then  injected into all port sites which were reapproximated at the skin level  with staples.  Sterile dressings were applied.  All sponge and needle counts  were correct x2.  The patient appeared to tolerate the procedure well  without complications.  He was able to be extubated and transferred to the  recovery unit in satisfactory condition.           ______________________________  Heloise Purpura, MD  Electronically Signed     LB/MEDQ  D:  06/07/2006  T:  06/07/2006  Job:  [id number]

## 2011-04-29 NOTE — H&P (Signed)
NAMEPACEY, ALTIZER                ACCOUNT NO.:  [id number]   MEDICAL RECORD NO.:  [id number]          PATIENT TYPE:  INP   LOCATION:  NA                           FACILITY:  [facility]   PHYSICIAN:  Heloise Purpura, MD      DATE OF BIRTH:  Nov 20, 1946   DATE OF ADMISSION:  06/07/2006  DATE OF DISCHARGE:                                HISTORY & PHYSICAL   CHIEF COMPLAINT:  Prostate cancer.   HISTORY:  Mr. Lacher is a 65 year old gentleman with recently diagnosed  clinical stage T1C prostate cancer with a PSA of 3.74 and a Gleason score of  3+3=6.  He underwent a metastatic evaluation with both a CT scan as well as  a bone scan which were both negative for metastatic disease.  Preoperative  IPSS score was 1 with an IIEF score of 23.  After discussing management  options for clinically localized prostate cancer, Mr. Hashem elected to  proceed with a robotic-assisted laparoscopic radical prostatectomy.   PAST MEDICAL HISTORY:  1.  Type 2 diabetes.  2.  Hypercholesterolemia.   PAST SURGICAL HISTORY:  Umbilical hernia repair.   MEDICATIONS:  1.  Centrum Silver multivitamin.  2.  Vitamin C.  3.  Cod liver oil.  4.  Folic acid.  5.  Fish oil, Omega 3.  6.  Stool softener.  7.  Actos 45 mg p.o. daily.  8.  Niaspan 500 mg p.o. daily.  9.  Bayer Aspirin 81 mg p.o. daily.   ALLERGIES:  No known drug allergies.   FAMILY HISTORY:  No history of prostate cancer or GU malignancy.  The  patient's father did have diabetes.  His father recently died after a  prolonged illness.  His mother has hypertension.   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  He denies tobacco or alcohol use.   PHYSICAL EXAMINATION:  CONSTITUTIONAL:  The patient is a well-nourished,  well-developed, age-appropriate male in no acute distress.  CARDIOVASCULAR:  Regular rate and rhythm without obvious murmurs.  LUNGS:  Clear bilaterally.  ABDOMEN:   The patient has a small umbilical hernia scar.  Otherwise, his  abdomen is soft and nontender and nondistended.  DRE:  No nodularity or induration.   IMPRESSION:  Low-risk clinically localized prostate cancer.   PLAN:  Mr. Hyams will undergo a robotic-assisted laparoscopic radical  prostatectomy and then be admitted to the hospital for routine postoperative  care.   ADDENDUM:  The patient's primary care physician is Dr. Marga Melnick.           ______________________________  Heloise Purpura, MD  Electronically Signed     LB/MEDQ  D:  06/07/2006  T:  06/07/2006  Job:  [id number]

## 2011-06-20 ENCOUNTER — Ambulatory Visit (INDEPENDENT_AMBULATORY_CARE_PROVIDER_SITE_OTHER): Payer: BC Managed Care – PPO | Admitting: Family Medicine

## 2011-06-20 ENCOUNTER — Encounter: Payer: Self-pay | Admitting: Family Medicine

## 2011-06-20 VITALS — BP 140/82 | Temp 98.7°F | Wt 217.4 lb

## 2011-06-20 DIAGNOSIS — R05 Cough: Secondary | ICD-10-CM

## 2011-06-20 DIAGNOSIS — R059 Cough, unspecified: Secondary | ICD-10-CM | POA: Insufficient documentation

## 2011-06-20 NOTE — Progress Notes (Signed)
  Subjective:    Patient ID: Alfred Foster, male    DOB: 04-Jul-1946, 65 y.o.   MRN: [id number]  HPI Cough- sxs started 9 months ago per wife's report.  Pt reports he has only noticed it for the last few weeks.  Cough is dry.  He works outside.  Denies post nasal drip.  No fevers.  Some SOB w/ squatting or leaning forward.  Denies SOB w/ exertion.   Review of Systems For ROS see HPI     Objective:   Physical Exam  Vitals reviewed. Constitutional: He appears well-developed and well-nourished. No distress.  HENT:  Head: Normocephalic and atraumatic.  Nose: Nose normal.  Mouth/Throat: Oropharynx is clear and moist. No oropharyngeal exudate.  Eyes: Conjunctivae and EOM are normal. Pupils are equal, round, and reactive to light.  Neck: Normal range of motion. Neck supple.  Cardiovascular: Normal rate, regular rhythm and normal heart sounds.   Pulmonary/Chest: Effort normal and breath sounds normal. No respiratory distress.  Lymphadenopathy:    He has no cervical adenopathy.          Assessment & Plan:

## 2011-06-20 NOTE — Patient Instructions (Signed)
Follow up in 1 month w/ Dr Alwyn Ren to recheck blood pressure and cough STOP the Lisinopril.  START the Benicar- 1 tab daily If the cough continues 2-3 weeks after switching meds, we'll need to consider post nasal drip, acid reflux, and possibly getting a chest xray. Call with any questions or concerns Hopefully this will help!

## 2011-06-22 ENCOUNTER — Telehealth: Payer: Self-pay | Admitting: Internal Medicine

## 2011-06-22 NOTE — Telephone Encounter (Signed)
errot

## 2011-06-30 NOTE — Assessment & Plan Note (Signed)
Multiple possible causes- ACE, PND, GERD.  Will start by eliminating ACE (switched to ARB and given samples).  If this does not improve his cough, next step would likely be to add PPI.  Pt to f/u w/ PCP.  Reviewed supportive care and red flags that should prompt return.  Pt expressed understanding and is in agreement w/ plan.

## 2011-07-08 ENCOUNTER — Encounter: Payer: Self-pay | Admitting: Internal Medicine

## 2011-07-25 ENCOUNTER — Ambulatory Visit (INDEPENDENT_AMBULATORY_CARE_PROVIDER_SITE_OTHER): Payer: BC Managed Care – PPO | Admitting: Internal Medicine

## 2011-07-25 ENCOUNTER — Encounter: Payer: Self-pay | Admitting: Internal Medicine

## 2011-07-25 ENCOUNTER — Other Ambulatory Visit: Payer: Self-pay | Admitting: Internal Medicine

## 2011-07-25 DIAGNOSIS — E785 Hyperlipidemia, unspecified: Secondary | ICD-10-CM

## 2011-07-25 DIAGNOSIS — I10 Essential (primary) hypertension: Secondary | ICD-10-CM

## 2011-07-25 DIAGNOSIS — E119 Type 2 diabetes mellitus without complications: Secondary | ICD-10-CM

## 2011-07-25 LAB — BUN: BUN: 14 mg/dL (ref 6–23)

## 2011-07-25 MED ORDER — LOSARTAN POTASSIUM 100 MG PO TABS: 100.0000 mg | ORAL_TABLET | Freq: Every day | ORAL | Status: DC

## 2011-07-25 NOTE — Progress Notes (Signed)
  Subjective:    Patient ID: Alfred Foster, male    DOB: 1946-07-19, 65 y.o.   MRN: [id number]  HPI #1HYPERTENSION: Disease Monitoring: Blood pressure range-136/85-90 @ home  Chest pain, palpitations- no       Dyspnea- no Medications: Compliance- yes  Lightheadedness,Syncope- no    Edema- no  #2 DIABETES: Disease Monitoring: Blood Sugar ranges-95-115; 2 hr post meal < 135  Polyuria/phagia/dipsia- no       Visual problems- no Medications: Compliance- yes  Hypoglycemic symptoms- no  A1c 6.5% in 10/11   #3 HYPERLIPIDEMIA: Disease Monitoring: See symptoms for Hypertension Medications: Compliance- yes  Abd pain, bowel changes- no   Muscle aches- occasionally LUE with working out Lipid: 5/12 :LDL 92; HDL 36.6    PMH Smoking Status noted :never       Review of Systems     Objective:   Physical Exam Gen.: Healthy and well-nourished in appearance. Alert, appropriate and cooperative throughout exam. Eyes: No corneal or conjunctival inflammation noted. Neck: No deformities, masses, or tenderness noted. Thyroid normal. Lungs: Normal respiratory effort; chest expands symmetrically. Lungs are clear to auscultation without rales, wheezes, or increased work of breathing. Heart: Normal rate and rhythm. Normal S1 and S2. No gallop, click, murmur or rub.                                                                                 Musculoskeletal/extremities:  No clubbing, cyanosis, edema. Bunions on feet (seeing Podiatrist). Nail health  good. Vascular: Carotid, radial artery, dorsalis pedis and  posterior tibial pulses are full and equal. No bruits present. Neurologic: Alert and oriented x3. Deep tendon reflexes symmetrical and normal.          Skin: Intact without suspicious lesions or rashes.  Psych: Mood and affect are normal. Normally interactive                                                                                         Assessment & Plan:  #1 DM ,  A1c monitor due  Plan: see Orders

## 2011-07-25 NOTE — Patient Instructions (Addendum)
Blood Pressure Goal  Ideally is an AVERAGE < 135/85. This AVERAGE should be calculated from @ least 5-7 BP readings taken @ different times of day on different days of week. You should not respond to isolated BP readings , but rather the AVERAGE for that week . Schedule physical in dec, 2012.

## 2011-07-26 LAB — MICROALBUMIN / CREATININE URINE RATIO: Microalb Creat Ratio: 0.4 mg/g (ref 0.0–30.0)

## 2011-08-09 ENCOUNTER — Other Ambulatory Visit: Payer: Self-pay | Admitting: Internal Medicine

## 2011-09-24 ENCOUNTER — Other Ambulatory Visit: Payer: Self-pay | Admitting: Internal Medicine

## 2011-09-27 ENCOUNTER — Other Ambulatory Visit: Payer: Self-pay | Admitting: Internal Medicine

## 2011-10-12 ENCOUNTER — Ambulatory Visit (INDEPENDENT_AMBULATORY_CARE_PROVIDER_SITE_OTHER): Payer: BC Managed Care – PPO

## 2011-10-12 DIAGNOSIS — Z23 Encounter for immunization: Secondary | ICD-10-CM

## 2011-10-31 ENCOUNTER — Telehealth: Payer: Self-pay | Admitting: *Deleted

## 2011-10-31 NOTE — Telephone Encounter (Signed)
Pt wife left VM that losartan is too expensive and would like to know if the Pt can get a cheaper med or if we have a discount to help with cost.Please advise

## 2011-10-31 NOTE — Telephone Encounter (Signed)
This is least expensive of  ARB BP meds; please check [facility], [employer] , [employer]. Also mail order from [place] is an option

## 2011-10-31 NOTE — Telephone Encounter (Signed)
Discuss with patient wife. Pt needs to have OV with copy of formulary to determine which med will be cheaper for the Pt. Pt wife advise that Pt needs to get at least a 30 day supply until we are able to find another med.

## 2011-12-14 ENCOUNTER — Ambulatory Visit (INDEPENDENT_AMBULATORY_CARE_PROVIDER_SITE_OTHER): Payer: BC Managed Care – PPO | Admitting: Internal Medicine

## 2011-12-14 ENCOUNTER — Encounter: Payer: Self-pay | Admitting: Internal Medicine

## 2011-12-14 VITALS — BP 138/90 | HR 90 | Temp 97.7°F | Ht 73.0 in | Wt 217.0 lb

## 2011-12-14 DIAGNOSIS — R059 Cough, unspecified: Secondary | ICD-10-CM

## 2011-12-14 DIAGNOSIS — R05 Cough: Secondary | ICD-10-CM

## 2011-12-14 MED ORDER — AZITHROMYCIN 250 MG PO TABS: ORAL_TABLET | ORAL | Status: AC

## 2011-12-14 NOTE — Progress Notes (Signed)
  Subjective:    Patient ID: Alfred Foster, male    DOB: 20-Dec-1945, 66 y.o.   MRN: [id number]  HPI Acute visit for respiratory symptoms Symptoms started  one week ago with mild sinus congestion, mild cough. He is taking over-the-counter cough medicine and has increased his fluid intake. He has no history of asthma or tobacco abuse. His wife is seen here today as well, she has more severe respiratory symptoms.  Past Medical History  Diagnosis Date  . Diabetes mellitus   . Hyperlipidemia   . Prostate cancer      Review of Systems No fever or chills No shortness of breath per se or wheezing. Very mild sputum production.     Objective:   Physical Exam  Constitutional: He is oriented to person, place, and time. He appears well-developed and well-nourished. No distress.  HENT:  Head: Normocephalic and atraumatic.  Right Ear: External ear normal.  Left Ear: External ear normal.  Mouth/Throat: No oropharyngeal exudate.       Mild nose congestion  Cardiovascular: Normal rate and regular rhythm.   No murmur heard. Pulmonary/Chest: Effort normal. No respiratory distress. He has no wheezes. He has no rales.       Very few rhonchi with cough  Musculoskeletal: He exhibits no edema.  Neurological: He is alert and oriented to person, place, and time.  Skin: He is not diaphoretic.        Assessment & Plan:

## 2011-12-14 NOTE — Assessment & Plan Note (Signed)
Presents  today with symptoms consistent with mild bronchitis. See instructions

## 2011-12-14 NOTE — Patient Instructions (Signed)
Rest, fluids , tylenol For cough, take Mucinex DM twice a day as needed  If you are not better in the next 2 or 3 days, please start taking the antibiotic called Zithromax   Call if no better in few days Call anytime if the symptoms are severe You are due to see your doctor, Dr. Alwyn Ren, please schedule an appointment to see him at your earliest convenience

## 2012-03-09 ENCOUNTER — Other Ambulatory Visit: Payer: Self-pay | Admitting: Family Medicine

## 2012-03-15 ENCOUNTER — Encounter: Payer: Self-pay | Admitting: Internal Medicine

## 2012-03-15 ENCOUNTER — Ambulatory Visit (INDEPENDENT_AMBULATORY_CARE_PROVIDER_SITE_OTHER): Payer: BC Managed Care – PPO | Admitting: Internal Medicine

## 2012-03-15 VITALS — BP 128/82 | HR 98 | Temp 97.9°F | Resp 12 | Ht 73.5 in | Wt 225.2 lb

## 2012-03-15 DIAGNOSIS — R9431 Abnormal electrocardiogram [ECG] [EKG]: Secondary | ICD-10-CM | POA: Insufficient documentation

## 2012-03-15 DIAGNOSIS — I10 Essential (primary) hypertension: Secondary | ICD-10-CM

## 2012-03-15 DIAGNOSIS — Z0000 Encounter for general adult medical examination without abnormal findings: Secondary | ICD-10-CM

## 2012-03-15 DIAGNOSIS — Z8546 Personal history of malignant neoplasm of prostate: Secondary | ICD-10-CM

## 2012-03-15 MED ORDER — LISINOPRIL 20 MG PO TABS: 20.0000 mg | ORAL_TABLET | Freq: Every day | ORAL | Status: DC

## 2012-03-15 MED ORDER — LOSARTAN POTASSIUM 100 MG PO TABS: 100.0000 mg | ORAL_TABLET | Freq: Every day | ORAL | Status: DC

## 2012-03-15 MED ORDER — METFORMIN HCL 500 MG PO TABS: ORAL_TABLET | ORAL | Status: DC

## 2012-03-15 MED ORDER — NIACIN ER (ANTIHYPERLIPIDEMIC) 1000 MG PO TBCR: EXTENDED_RELEASE_TABLET | ORAL | Status: DC

## 2012-03-15 MED ORDER — PRAVASTATIN SODIUM 40 MG PO TABS: ORAL_TABLET | ORAL | Status: DC

## 2012-03-15 NOTE — Progress Notes (Signed)
  Subjective:    Patient ID: [name], male    DOB: 04/20/1946, 66 y.o.   MRN: [id number]  HPI  Mr Tith is here for a physical;active concerns include ? enlarging lipoma  L abdomen      Review of Systems HYPERTENSION: Disease Monitoring: Blood pressure range-not monitoring BP Chest pain, palpitations- no       Dyspnea- no Medications: Compliance- yes Lightheadedness,Syncope- no    Edema- no  DIABETES: Disease Monitoring: Blood Sugar ranges-102-107; 2 hr post meal < 140  Polyuria/phagia/dipsia- no       Visual problems- no; Ophth exam 3/13, no retinopathy Medications: Compliance- yes  Hypoglycemic symptoms-no  HYPERLIPIDEMIA: Disease Monitoring: See symptoms for Hypertension Medications: Compliance- yes  Abd pain, bowel changes-no  Muscle aches- no       Objective:   Physical Exam Gen.: Well muscled ,healthy and well-nourished in appearance. Alert, appropriate and cooperative throughout exam. Head: Normocephalic without obvious abnormalities; pattern alopecia . Goatee Eyes: No corneal or conjunctival inflammation noted.  Extraocular motion intact. Vision slightly decreased OD. 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 Canals clear .TMs normal. Hearing is grossly normal bilaterally. Nose: External nasal exam reveals no deformity or inflammation. Nasal mucosa are pink and moist. No lesions or exudates noted.  Mouth: Oral mucosa and oropharynx reveal no lesions or exudates. Teeth in good repair. Neck: No deformities, masses, or tenderness noted. Range of motion & Thyroid normal Lungs: Normal respiratory effort; chest expands symmetrically. Lungs are clear to auscultation without rales, wheezes, or increased work of breathing. Heart: Normal rate and rhythm. Normal S1 and S2. No gallop, click, or rub. S4 with slurring w/o  murmur. Abdomen: Bowel sounds normal; abdomen soft and nontender. No masses, organomegaly or hernias noted. Genitalia: Dr  Laverle Patter                                 Musculoskeletal/extremities: No deformity or scoliosis noted of  the thoracic or lumbar spine. No clubbing, cyanosis, edema noted. Range of motion  normal .Tone & strength  Normal.DIP osteoarthritic finger changes,mild. Nail health  good. Vascular: Carotid, radial artery, dorsalis pedis and  posterior tibial pulses are full and equal. No bruits present. Neurologic: Alert and oriented x3. Deep tendon reflexes symmetrical and normal. Light touch normal over feet.        Skin: Intact without suspicious lesions or rashes. 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 This transilluminates; it is not attached to the subcutaneous musculature. Lymph: No cervical, axillary lymphadenopathy present. Psych: Mood and affect are normal. Normally interactive                                                                                        Assessment & Plan:  #1 comprehensive physical exam; no acute findings #2 see Problem List with Assessments & Recommendations  #3 lipoma, question enlarging. This has been evaluated by general surgery. I discussed the criteria by which this would not be a cosmetic issue. Plan: see Orders

## 2012-03-15 NOTE — Patient Instructions (Signed)
Please  schedule fasting Labs : BMET,Lipids, hepatic panel, CBC & dif, TSH. PLEASE BRING THESE INSTRUCTIONS TO FOLLOW UP  LAB APPOINTMENT.This will guarantee correct labs are drawn, eliminating need for repeat blood sampling ( needle sticks ! ). Diagnoses /Codes: V70.0.  Preventive Health Care: Exercise at least 30-45 minutes a day,  3-4 days a week.  Eat a low-fat diet with lots of fruits and vegetables, up to 7-9 servings per day. Consume less than 40 grams of sugar per day from foods & drinks with High Fructose Corn Sugar as # 1,2,3 or # 4 on label. Eye Doctor - have an eye exam @ least annually.                                                         Health Care Power of Attorney & Living Will. Complete if not in place ; these place you in charge of your health care decisions.  Please try to go on My Chart within  24 hours after labs to allow me to release the results directly to you.

## 2012-03-16 ENCOUNTER — Other Ambulatory Visit (INDEPENDENT_AMBULATORY_CARE_PROVIDER_SITE_OTHER): Payer: BC Managed Care – PPO

## 2012-03-16 DIAGNOSIS — Z0000 Encounter for general adult medical examination without abnormal findings: Secondary | ICD-10-CM

## 2012-03-16 LAB — BASIC METABOLIC PANEL
BUN: 20 mg/dL (ref 6–23)
CO2: 24 mEq/L (ref 19–32)
Calcium: 9.3 mg/dL (ref 8.4–10.5)
Chloride: 108 mEq/L (ref 96–112)
Creatinine, Ser: 1.3 mg/dL (ref 0.4–1.5)
GFR: 69.47 mL/min (ref >60.00)
Glucose, Bld: 121 mg/dL — ABNORMAL HIGH (ref 70–99)
Potassium: 4.4 mEq/L (ref 3.5–5.1)
Sodium: 140 mEq/L (ref 135–145)

## 2012-03-16 LAB — LIPID PANEL
HDL: 38.9 mg/dL — ABNORMAL LOW (ref >39.00)
Total CHOL/HDL Ratio: 4
VLDL: 9.4 mg/dL (ref 0.0–40.0)

## 2012-03-16 LAB — CBC WITH DIFFERENTIAL/PLATELET
Basophils Relative: 0.6 % (ref 0.0–3.0)
Eosinophils Relative: 2.9 % (ref 0.0–5.0)
HCT: 42.5 % (ref 39.0–52.0)
Lymphs Abs: 2.7 10*3/uL (ref 0.7–4.0)
MCV: 96.6 fl (ref 78.0–100.0)
Monocytes Absolute: 0.7 10*3/uL (ref 0.1–1.0)
Monocytes Relative: 11.3 % (ref 3.0–12.0)
Neutrophils Relative %: 41.8 % — ABNORMAL LOW (ref 43.0–77.0)
RBC: 4.4 Mil/uL (ref 4.22–5.81)
WBC: 6.1 10*3/uL (ref 4.5–10.5)

## 2012-03-16 LAB — HEPATIC FUNCTION PANEL: Total Bilirubin: 0.8 mg/dL (ref 0.3–1.2)

## 2012-03-21 ENCOUNTER — Other Ambulatory Visit: Payer: Self-pay | Admitting: Internal Medicine

## 2012-03-22 ENCOUNTER — Other Ambulatory Visit: Payer: Self-pay | Admitting: *Deleted

## 2012-03-22 DIAGNOSIS — I10 Essential (primary) hypertension: Secondary | ICD-10-CM

## 2012-03-22 MED ORDER — NIACIN ER (ANTIHYPERLIPIDEMIC) 1000 MG PO TBCR: EXTENDED_RELEASE_TABLET | ORAL | Status: DC

## 2012-03-22 MED ORDER — LOSARTAN POTASSIUM 100 MG PO TABS: 100.0000 mg | ORAL_TABLET | Freq: Every day | ORAL | Status: DC

## 2012-03-23 ENCOUNTER — Other Ambulatory Visit: Payer: Self-pay | Admitting: Internal Medicine

## 2012-03-28 ENCOUNTER — Other Ambulatory Visit: Payer: Self-pay | Admitting: Internal Medicine

## 2012-03-28 NOTE — Telephone Encounter (Signed)
A1c 250.00 

## 2012-04-20 ENCOUNTER — Telehealth: Payer: Self-pay

## 2012-04-20 NOTE — Telephone Encounter (Signed)
There is no generic equivalent for this @ this time ; but it will be going generic in the near future. In the meantime OTC Niacin could be used

## 2012-04-20 NOTE — Telephone Encounter (Signed)
Discussed with patient and he voiced understanding, he will pick up the Niacin from the pharmacy and be sure to discuss with pharmacist to make sure he is getting the correct one. He will call here if any concerns.     KP

## 2012-04-20 NOTE — Telephone Encounter (Signed)
Call back from patient and he stated that his Niaspan was 1000 mg but I made him aware to take 500 mg daily. I made him aware that per Dr.Lowne the Niacin will cause flushing and she suggests that he start with 500 mg of the Niacin and increase to 1000 mg after 1 month. He voiced understanding and agreed to do so. Will call with anymore concerns   KP

## 2012-04-20 NOTE — Telephone Encounter (Signed)
msg from patient and he stated that the Niaspan was too expensive, he wanted to get something that was cheaper but equivalent sent to the pharmacy. Please advise   KP

## 2012-04-26 ENCOUNTER — Telehealth: Payer: Self-pay | Admitting: *Deleted

## 2012-04-26 NOTE — Telephone Encounter (Signed)
Pt called in on back line at 5:25 pm asking to speak with Chrae. Pt requesting to leave message since she is unavailable.  Pt states that losartan is a non-preferred med on his plan and is too expensive. Pt has contacted his insurance and they recommend that he try Avapro (irbesartan) 300 mg. .Please advise   Pt advise that Dr Alwyn Ren is out of the office but will send to another physician for review in the AM. Pt ok

## 2012-04-27 MED ORDER — IRBESARTAN 300 MG PO TABS: 300.0000 mg | ORAL_TABLET | Freq: Every day | ORAL | Status: DC

## 2012-04-27 NOTE — Telephone Encounter (Signed)
That is fine 

## 2012-04-27 NOTE — Telephone Encounter (Signed)
Please advise on sig for med

## 2012-04-27 NOTE — Telephone Encounter (Signed)
1 po qd  #30  2 refills Ov 2-3 week

## 2012-04-27 NOTE — Telephone Encounter (Signed)
Discussed with patient--- Rx faxed to the pharmacy and patient has been made aware. He will call back to schedule an apt.     KP

## 2012-05-10 ENCOUNTER — Other Ambulatory Visit: Payer: Self-pay | Admitting: Internal Medicine

## 2012-05-14 ENCOUNTER — Ambulatory Visit (INDEPENDENT_AMBULATORY_CARE_PROVIDER_SITE_OTHER): Payer: BC Managed Care – PPO | Admitting: Internal Medicine

## 2012-05-14 ENCOUNTER — Encounter: Payer: Self-pay | Admitting: Internal Medicine

## 2012-05-14 VITALS — BP 136/82 | HR 86 | Temp 98.1°F | Wt 227.2 lb

## 2012-05-14 DIAGNOSIS — I10 Essential (primary) hypertension: Secondary | ICD-10-CM

## 2012-05-14 NOTE — Patient Instructions (Signed)
Blood Pressure Goal  Ideally is an AVERAGE < 135/85. This AVERAGE should be calculated from @ least 5-7 BP readings taken @ different times of day on different days of week. You should not respond to isolated BP readings , but rather the AVERAGE for that week  

## 2012-05-14 NOTE — Progress Notes (Signed)
  Subjective:    Patient ID: Alfred Foster, male    DOB: 25-Nov-1946, 66 y.o.   MRN: [id number]  HPI Niaspan was switched to sustained release niacin 1000 mg. Following this his blood pressure using a wrist cuff was markedly variable. Today's blood pressure would represent a good value compared to some of the recordings. BP has been 111/90 -203/111.    Review of Systems   Chest pain: no  Dyspnea: no   Claudication: no  Medication compliance: no Medication Side Effects  Lightheadedness: no  Urinary frequency: no   Edema: no   Preventitive Healthcare:  Exercise: calisthentics & yard work   Diet Pattern: no  Salt Restriction: yes       Objective:   Physical Exam He appears healthy and well-nourished; he is in no acute distress  No carotid bruits are present.  Heart rhythm and rate are normal with no significant murmurs or gallops. S 4  Chest is clear with no increased work of breathing  There is no evidence of aortic aneurysm or renal artery bruits  He has no clubbing or edema.   Pedal pulses are intact   No ischemic skin changes are present         Assessment & Plan:

## 2012-05-14 NOTE — Assessment & Plan Note (Signed)
Blood pressure appears to be controlled on Avapro 300 mg daily. He is not on an ACE inhibitor at this time.

## 2012-07-02 ENCOUNTER — Other Ambulatory Visit: Payer: Self-pay | Admitting: Internal Medicine

## 2012-07-02 DIAGNOSIS — E119 Type 2 diabetes mellitus without complications: Secondary | ICD-10-CM

## 2012-07-02 MED ORDER — METFORMIN HCL 500 MG PO TABS: ORAL_TABLET | ORAL | Status: DC

## 2012-07-02 NOTE — Telephone Encounter (Signed)
RX sent, patient needs to have an A1c (future order placed)

## 2012-07-02 NOTE — Telephone Encounter (Signed)
refill MetFORMIN HCl (Tab) 500 MG TAKE 2 TABLETS BY MOUTH TWICE A DAY WITH LARGEST MEALS #180, last fill 4.17.13 Last oc 6.3.13 follow up

## 2012-07-17 ENCOUNTER — Telehealth: Payer: Self-pay

## 2012-07-17 NOTE — Telephone Encounter (Signed)
Spoke with patient, insurance drug plan is no longer covering Avapro, per Dr.Hopper change to Losartan. Patient states he already changed to Losartan 100 mg once daily. I updated med list to reflect changes

## 2012-08-24 ENCOUNTER — Ambulatory Visit (INDEPENDENT_AMBULATORY_CARE_PROVIDER_SITE_OTHER): Payer: Medicare Other | Admitting: Internal Medicine

## 2012-08-24 ENCOUNTER — Ambulatory Visit: Payer: BC Managed Care – PPO | Admitting: Internal Medicine

## 2012-08-24 ENCOUNTER — Encounter: Payer: Self-pay | Admitting: Internal Medicine

## 2012-08-24 VITALS — BP 114/78 | HR 76 | Temp 98.0°F | Wt 219.0 lb

## 2012-08-24 DIAGNOSIS — Z23 Encounter for immunization: Secondary | ICD-10-CM

## 2012-08-24 DIAGNOSIS — E119 Type 2 diabetes mellitus without complications: Secondary | ICD-10-CM

## 2012-08-24 DIAGNOSIS — M25819 Other specified joint disorders, unspecified shoulder: Secondary | ICD-10-CM

## 2012-08-24 DIAGNOSIS — M7542 Impingement syndrome of left shoulder: Secondary | ICD-10-CM

## 2012-08-24 LAB — HEMOGLOBIN A1C: Hgb A1c MFr Bld: 6.4 % (ref 4.6–6.5)

## 2012-08-24 LAB — MICROALBUMIN / CREATININE URINE RATIO
Creatinine,U: 251.9 mg/dL
Microalb Creat Ratio: 0.2 mg/g (ref 0.0–30.0)

## 2012-08-24 MED ORDER — TRAMADOL HCL 50 MG PO TABS: 50.0000 mg | ORAL_TABLET | Freq: Three times a day (TID) | ORAL | Status: AC | PRN

## 2012-08-24 NOTE — Patient Instructions (Addendum)
The best exercises for the shoulder are  freestyle swimming, stretch aerobics, and yoga. Use an anti-inflammatory cream such as Aspercreme or Zostrix cream twice a day to the shoulder as needed. In lieu of this warm moist compresses or  hot water bottle can be used. Do not apply ice .  If you activate My Chart; the results can be released to you as soon as they populate from the lab. If you choose not to use this program; the labs have to be reviewed, copied & mailed   causing a delay in getting the results to you.

## 2012-08-24 NOTE — Progress Notes (Signed)
  Subjective:    Patient ID: Alfred Foster, male    DOB: [birth year], 66 y.o.   MRN: [id number]  HPI Symptoms began approximately 4 weeks ago after repetitive motion weed eating his yard. Symptoms are worse after he showers at night and is exposed to air conditioning. There's been some improvement with wearing long sleeves, covering up, and applying heat.  The pain is described as intermittently sharp and occasionally  radiating down the left upper extremity as far as the palm. Occasionally he'll notice minor tingling in the anterior shoulder area. There may be some weakness of the left upper extremity. Lifting 5-10 # weights he is sometimes unable to raise the arm above shoulder level.    Review of Systems  Constitutional: no fever, chills, sweats, change in weight  Musculoskeletal:no  muscle cramps or pain; no  joint stiffness, redness, or swelling Skin:no rash, color change Neuro: no  incontinence (stool/urine); numbness  Heme:no lymphadenopathy; abnormal bruising or bleeding        Objective:   Physical Exam weight is excellent and he appears healthy and well-nourished  Musculoskeletal/Extremities: Gait and station;  stability; muscle strength; and  muscle tone are normal. Nail health is good. 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 No cyanosis, clubbing or edema are present.  He does have decreased range of motion of both upper extremities. He is able to elevate the left upper extremity about the head; there is mainly limitation posteriorly.  Neck range of motion is excellent. There is no spinal malalignment  Deep tendon reflexes are normal  No suspicious skin lesions are present.  There is no lymphadenopathy about the head neck or axilla               Assessment & Plan:  #1 left shoulder impingement syndrome; no definite rotator cuff injury.  Plan: See orders and recommendations. Physical therapy is recommended if symptoms persist or progress

## 2012-08-26 MED ORDER — METFORMIN HCL 500 MG PO TABS: ORAL_TABLET | ORAL | Status: DC

## 2012-08-26 NOTE — Addendum Note (Signed)
Addended byMarga Melnick F on: 08/26/2012 02:23 PM   Modules accepted: Orders

## 2012-08-26 NOTE — Assessment & Plan Note (Signed)
A1c 6.4%; Metformin decreased to 1 in am & 2 with eve meal. A1c in 6 mos

## 2012-08-28 ENCOUNTER — Telehealth: Payer: Self-pay | Admitting: Internal Medicine

## 2012-08-28 NOTE — Telephone Encounter (Signed)
LM ON TRIAGE at 213pm returned tiffany's call Did not leave a callback #

## 2012-08-28 NOTE — Telephone Encounter (Signed)
Pt aware he will stop metformin and repeat A1C in 8 weeks.

## 2012-08-28 NOTE — Telephone Encounter (Signed)
Spoke with pt to give A1C results and how to take metformin, however pt stated he was only taking one metformin a day in the evening.  He got his bottle which did read two tabs twice daily.  He was not sure why he has only been taking one, but has been doing it like that for a while.  How would you like him to continue?

## 2012-08-28 NOTE — Telephone Encounter (Signed)
If his A1c is on only one metformin a day; hold the metformin for 8 weeks and repeat the A1c. Code : 790.29

## 2012-09-06 ENCOUNTER — Other Ambulatory Visit: Payer: Self-pay

## 2012-09-06 NOTE — Telephone Encounter (Signed)
Spoke with Pt's wife regarding coverage for Pravastatin that had been in question.  I told her that per Dr. Alwyn Ren, Patient should be able to get rx at [facility] or [facility], 90 pills for $10, even without insurance coverage.  Patient's wife acknowledged and agreed

## 2012-11-01 ENCOUNTER — Other Ambulatory Visit (INDEPENDENT_AMBULATORY_CARE_PROVIDER_SITE_OTHER): Payer: Medicare Other

## 2012-11-01 DIAGNOSIS — E119 Type 2 diabetes mellitus without complications: Secondary | ICD-10-CM

## 2012-11-01 LAB — HEMOGLOBIN A1C: Hgb A1c MFr Bld: 6.3 % (ref 4.6–6.5)

## 2012-11-09 ENCOUNTER — Other Ambulatory Visit: Payer: Self-pay | Admitting: Internal Medicine

## 2012-11-12 ENCOUNTER — Ambulatory Visit (INDEPENDENT_AMBULATORY_CARE_PROVIDER_SITE_OTHER): Payer: Medicare Other

## 2012-11-12 DIAGNOSIS — Z299 Encounter for prophylactic measures, unspecified: Secondary | ICD-10-CM

## 2012-11-12 DIAGNOSIS — Z23 Encounter for immunization: Secondary | ICD-10-CM

## 2012-11-13 ENCOUNTER — Other Ambulatory Visit: Payer: Self-pay | Admitting: Internal Medicine

## 2012-11-13 NOTE — Telephone Encounter (Signed)
Received another request for strips. [facility] and they stated they never received Rx Felicia sent over 11/09/12 only received Rx for lancets so I approved it over phone. Rx done.   MW

## 2012-11-15 ENCOUNTER — Encounter: Payer: Self-pay | Admitting: Gastroenterology

## 2012-11-23 ENCOUNTER — Ambulatory Visit (INDEPENDENT_AMBULATORY_CARE_PROVIDER_SITE_OTHER): Payer: Medicare Other

## 2012-11-23 DIAGNOSIS — Z23 Encounter for immunization: Secondary | ICD-10-CM

## 2012-11-23 DIAGNOSIS — Z2911 Encounter for prophylactic immunotherapy for respiratory syncytial virus (RSV): Secondary | ICD-10-CM

## 2012-12-06 ENCOUNTER — Ambulatory Visit: Payer: Medicare Other | Admitting: Internal Medicine

## 2012-12-14 ENCOUNTER — Ambulatory Visit (INDEPENDENT_AMBULATORY_CARE_PROVIDER_SITE_OTHER): Payer: Medicare Other | Admitting: Internal Medicine

## 2012-12-14 ENCOUNTER — Encounter: Payer: Self-pay | Admitting: Internal Medicine

## 2012-12-14 VITALS — BP 128/80 | HR 95 | Temp 97.9°F | Wt 231.8 lb

## 2012-12-14 DIAGNOSIS — T148XXA Other injury of unspecified body region, initial encounter: Secondary | ICD-10-CM

## 2012-12-14 LAB — PROTIME-INR
INR: 1.01 (ref <1.50)
Prothrombin Time: 13.3 seconds (ref 11.6–15.2)

## 2012-12-14 NOTE — Addendum Note (Signed)
Addended by: Silvio Pate D on: 12/14/2012 05:05 PM   Modules accepted: Orders

## 2012-12-14 NOTE — Patient Instructions (Addendum)
Avoid excess aspirin , ibuprofen, naproxen etc .Repeat blood count if  any abnormal bruising or bleeding occur.

## 2012-12-14 NOTE — Progress Notes (Signed)
  Subjective:    Patient ID: Alfred Foster, male    DOB: 01-Mar-1946, 66 y.o.   MRN: [id number]  HPI  Bruising 3 & 1/ 2 weeks w/o trigger or injury manifested as bruises in medial thighs & LUE. He has no PMH  & there is no FH of bleeding or clotting disorders.    Review of Systems He denies epistaxis, hemoptysis, hematuria, melena, or rectal bleeding.He has no unexplained weight loss, dysphagia, or abdominal pain.  He has no difficulty stopping bleeding with injury.        Objective:   Physical Exam General appearance is one of good health and nourishment w/o distress.  Eyes: No conjunctival inflammation or scleral icterus is present.  Oral exam: Dental hygiene is good; lips and gums are healthy appearing.There is no oropharyngeal erythema or exudate noted.   Heart:  Normal rate and regular rhythm. S1 and S2 normal without gallop, murmur,  rub . S4 vs click @ apex  Lungs:Chest clear to auscultation; no wheezes, rhonchi,rales ,or rubs present.No increased work of breathing.   Abdomen: bowel sounds normal, soft and non-tender without masses, organomegaly or hernias noted.  No guarding or rebound   Skin:Warm & dry.  Intact without suspicious lesions or rashes ; no significant bruising  Lymphatic: No lymphadenopathy is noted about the head, neck, axilla              Assessment & Plan:   #1 bruising, resolving(ed) Plan: See orders and recommendations

## 2012-12-15 LAB — CBC WITH DIFFERENTIAL/PLATELET
Basophils Relative: 1 % (ref 0–1)
Hemoglobin: 15.2 g/dL (ref 13.0–17.0)
MCHC: 34.8 g/dL (ref 30.0–36.0)
Monocytes Relative: 12 % (ref 3–12)
Neutro Abs: 4.4 10*3/uL (ref 1.7–7.7)
Neutrophils Relative %: 54 % (ref 43–77)
RBC: 4.88 MIL/uL (ref 4.22–5.81)

## 2013-04-02 ENCOUNTER — Other Ambulatory Visit: Payer: Self-pay | Admitting: Internal Medicine

## 2013-05-10 ENCOUNTER — Other Ambulatory Visit: Payer: Self-pay | Admitting: Internal Medicine

## 2013-05-10 NOTE — Telephone Encounter (Signed)
LIPID/HEP 272.4/995.20  

## 2013-05-22 ENCOUNTER — Encounter: Payer: Self-pay | Admitting: Gastroenterology

## 2013-06-18 ENCOUNTER — Ambulatory Visit (AMBULATORY_SURGERY_CENTER): Payer: Medicare Other

## 2013-06-18 ENCOUNTER — Encounter: Payer: Self-pay | Admitting: Gastroenterology

## 2013-06-18 VITALS — Ht 73.0 in | Wt 230.8 lb

## 2013-06-18 DIAGNOSIS — Z1211 Encounter for screening for malignant neoplasm of colon: Secondary | ICD-10-CM

## 2013-06-18 MED ORDER — MOVIPREP 100 G PO SOLR: ORAL | Status: DC

## 2013-06-24 ENCOUNTER — Encounter: Payer: Self-pay | Admitting: Gastroenterology

## 2013-06-24 ENCOUNTER — Ambulatory Visit (AMBULATORY_SURGERY_CENTER): Payer: Medicare Other | Admitting: Gastroenterology

## 2013-06-24 VITALS — BP 126/83 | HR 66 | Temp 97.5°F | Resp 16 | Ht 73.0 in | Wt 230.0 lb

## 2013-06-24 DIAGNOSIS — Z1211 Encounter for screening for malignant neoplasm of colon: Secondary | ICD-10-CM

## 2013-06-24 MED ORDER — SODIUM CHLORIDE 0.9 % IV SOLN: 500.0000 mL | INTRAVENOUS | Status: DC

## 2013-06-24 NOTE — Op Note (Signed)
[facility] [address] [place], [address]   COLONOSCOPY PROCEDURE REPORT  PATIENT: Carzell, Saldivar  MR#: [id number] BIRTHDATE: 01/08/1946 , 66  yrs. old GENDER: Male ENDOSCOPIST: Mardella Layman, MD, [facility] REFERRED BY:  Marga Melnick, M.D. PROCEDURE DATE:  06/24/2013 PROCEDURE:   Colonoscopy, screening ASA CLASS:   Class II INDICATIONS:average risk screening. MEDICATIONS: propofol (Diprivan) 200mg  IV  DESCRIPTION OF PROCEDURE:   After the risks and benefits and of the procedure were explained, informed consent was obtained.  A digital rectal exam revealed no abnormalities of the rectum.    The LB [id number]  endoscope was introduced through the anus and advanced to the    .  The quality of the prep was excellent, using MoviPrep .  The instrument was then slowly withdrawn as the colon was fully examined.     COLON FINDINGS: A normal appearing cecum, ileocecal valve, and appendiceal orifice were identified.  The ascending, hepatic flexure, transverse, splenic flexure, descending, sigmoid colon and rectum appeared unremarkable.  No polyps or cancers were seen. Retroflexed views revealed no abnormalities.     The scope was then withdrawn from the patient and the procedure completed.  COMPLICATIONS: There were no complications. ENDOSCOPIC IMPRESSION: Normal colon...no polyps noted.  RECOMMENDATIONS: 1.  Continue current colorectal screening recommendations for "routine risk" patients with a repeat colonoscopy in 10 years. 2.  Continue current medications   REPEAT EXAM:  cc:  _______________________________ eSignedMardella Layman, MD, [facility] 06/24/2013 9:20 AM

## 2013-06-24 NOTE — Progress Notes (Signed)
Patient did not experience any of the following events: a burn prior to discharge; a fall within the facility; wrong site/side/patient/procedure/implant event; or a hospital transfer or hospital admission upon discharge from the facility. (G8907) Patient did not have preoperative order for IV antibiotic SSI prophylaxis. (G8918)  

## 2013-06-24 NOTE — Patient Instructions (Addendum)
Impressions/recommnedations:  Normal colon  Repeat colonoscopy in 10 years.  YOU HAD AN ENDOSCOPIC PROCEDURE TODAY AT THE [facility]: Refer to the procedure report that was given to you for any specific questions about what was found during the examination.  If the procedure report does not answer your questions, please call your gastroenterologist to clarify.  If you requested that your care partner not be given the details of your procedure findings, then the procedure report has been included in a sealed envelope for you to review at your convenience later.  YOU SHOULD EXPECT: Some feelings of bloating in the abdomen. Passage of more gas than usual.  Walking can help get rid of the air that was put into your GI tract during the procedure and reduce the bloating. If you had a lower endoscopy (such as a colonoscopy or flexible sigmoidoscopy) you may notice spotting of blood in your stool or on the toilet paper. If you underwent a bowel prep for your procedure, then you may not have a normal bowel movement for a few days.  DIET: Your first meal following the procedure should be a light meal and then it is ok to progress to your normal diet.  A half-sandwich or bowl of soup is an example of a good first meal.  Heavy or fried foods are harder to digest and may make you feel nauseous or bloated.  Likewise meals heavy in dairy and vegetables can cause extra gas to form and this can also increase the bloating.  Drink plenty of fluids but you should avoid alcoholic beverages for 24 hours.  ACTIVITY: Your care partner should take you home directly after the procedure.  You should plan to take it easy, moving slowly for the rest of the day.  You can resume normal activity the day after the procedure however you should NOT DRIVE or use heavy machinery for 24 hours (because of the sedation medicines used during the test).    SYMPTOMS TO REPORT IMMEDIATELY: A gastroenterologist can be reached at  any hour.  During normal business hours, 8:30 AM to 5:00 PM Monday through Friday, call [phone].  After hours and on weekends, please call the GI answering service at [phone] who will take a message and have the physician on call contact you.   Following lower endoscopy (colonoscopy or flexible sigmoidoscopy):  Excessive amounts of blood in the stool  Significant tenderness or worsening of abdominal pains  Swelling of the abdomen that is new, acute  Fever of 100F or higher  FOLLOW UP: If any biopsies were taken you will be contacted by phone or by letter within the next 1-3 weeks.  Call your gastroenterologist if you have not heard about the biopsies in 3 weeks.  Our staff will call the home number listed on your records the next business day following your procedure to check on you and address any questions or concerns that you may have at that time regarding the information given to you following your procedure. This is a courtesy call and so if there is no answer at the home number and we have not heard from you through the emergency physician on call, we will assume that you have returned to your regular daily activities without incident.  SIGNATURES/CONFIDENTIALITY: You and/or your care partner have signed paperwork which will be entered into your electronic medical record.  These signatures attest to the fact that that the information above on your After Visit Summary has been reviewed  and is understood.  Full responsibility of the confidentiality of this discharge information lies with you and/or your care-partner. 

## 2013-06-25 ENCOUNTER — Telehealth: Payer: Self-pay | Admitting: *Deleted

## 2013-06-25 NOTE — Telephone Encounter (Signed)
  Follow up Call-  Call back number 06/24/2013  Post procedure Call Back phone  # [phone]  Permission to leave phone message Yes     Patient questions:  Do you have a fever, pain , or abdominal swelling? no Pain Score  0 *  Have you tolerated food without any problems? yes  Have you been able to return to your normal activities? yes  Do you have any questions about your discharge instructions: Diet   no Medications  no Follow up visit  no  Do you have questions or concerns about your Care? no  Actions: * If pain score is 4 or above: No action needed, pain <4.  Everything fell right in place.

## 2013-07-02 ENCOUNTER — Encounter: Payer: Medicare Other | Admitting: Gastroenterology

## 2013-08-09 ENCOUNTER — Telehealth: Payer: Self-pay | Admitting: Internal Medicine

## 2013-08-09 DIAGNOSIS — E119 Type 2 diabetes mellitus without complications: Secondary | ICD-10-CM

## 2013-08-09 DIAGNOSIS — I10 Essential (primary) hypertension: Secondary | ICD-10-CM

## 2013-08-09 DIAGNOSIS — T887XXA Unspecified adverse effect of drug or medicament, initial encounter: Secondary | ICD-10-CM

## 2013-08-09 DIAGNOSIS — E785 Hyperlipidemia, unspecified: Secondary | ICD-10-CM

## 2013-08-09 NOTE — Telephone Encounter (Signed)
08/09/2013  Alfred Foster came by the office.  He is asking when the next time he needs to have labs done and if he needs a booster or tetanus shot.  Please advise.  bw

## 2013-08-09 NOTE — Telephone Encounter (Signed)
LM @ (12:01pm) asking the pt to RTC regarding note below.//AB/CMA

## 2013-08-13 ENCOUNTER — Other Ambulatory Visit: Payer: Self-pay | Admitting: General Practice

## 2013-08-13 MED ORDER — LOSARTAN POTASSIUM 100 MG PO TABS: ORAL_TABLET | ORAL | Status: DC

## 2013-08-14 NOTE — Telephone Encounter (Signed)
Lm @ (10:59am) asking the pt to RTC regarding note below.//AB/.CMA 

## 2013-08-15 NOTE — Telephone Encounter (Signed)
Spoke with the pt's wife and scheduled the pt to come in for fasting labs before appt on 08-27-13.  Future labs ordered and sent.//AB/CMA

## 2013-08-16 ENCOUNTER — Other Ambulatory Visit: Payer: Self-pay | Admitting: Internal Medicine

## 2013-08-19 ENCOUNTER — Other Ambulatory Visit (INDEPENDENT_AMBULATORY_CARE_PROVIDER_SITE_OTHER): Payer: Medicare Other

## 2013-08-19 DIAGNOSIS — E785 Hyperlipidemia, unspecified: Secondary | ICD-10-CM

## 2013-08-19 DIAGNOSIS — T887XXA Unspecified adverse effect of drug or medicament, initial encounter: Secondary | ICD-10-CM

## 2013-08-19 DIAGNOSIS — I10 Essential (primary) hypertension: Secondary | ICD-10-CM

## 2013-08-19 DIAGNOSIS — E119 Type 2 diabetes mellitus without complications: Secondary | ICD-10-CM

## 2013-08-19 LAB — BASIC METABOLIC PANEL
BUN: 18 mg/dL (ref 6–23)
Chloride: 113 mEq/L — ABNORMAL HIGH (ref 96–112)
Creatinine, Ser: 1.3 mg/dL (ref 0.4–1.5)
Glucose, Bld: 111 mg/dL — ABNORMAL HIGH (ref 70–99)

## 2013-08-19 LAB — HEPATIC FUNCTION PANEL
Alkaline Phosphatase: 75 U/L (ref 39–117)
Bilirubin, Direct: 0 mg/dL (ref 0.0–0.3)
Total Bilirubin: 0.7 mg/dL (ref 0.3–1.2)
Total Protein: 7.2 g/dL (ref 6.0–8.3)

## 2013-08-19 LAB — LIPID PANEL
Cholesterol: 147 mg/dL (ref 0–200)
LDL Cholesterol: 98 mg/dL (ref 0–99)

## 2013-08-19 LAB — HEMOGLOBIN A1C: Hgb A1c MFr Bld: 6.9 % — ABNORMAL HIGH (ref 4.6–6.5)

## 2013-08-27 ENCOUNTER — Ambulatory Visit: Payer: Medicare Other | Admitting: Internal Medicine

## 2013-08-28 ENCOUNTER — Ambulatory Visit (INDEPENDENT_AMBULATORY_CARE_PROVIDER_SITE_OTHER): Payer: Medicare Other | Admitting: Internal Medicine

## 2013-08-28 ENCOUNTER — Ambulatory Visit: Payer: Medicare Other

## 2013-08-28 ENCOUNTER — Encounter: Payer: Self-pay | Admitting: Internal Medicine

## 2013-08-28 DIAGNOSIS — I10 Essential (primary) hypertension: Secondary | ICD-10-CM

## 2013-08-28 DIAGNOSIS — E785 Hyperlipidemia, unspecified: Secondary | ICD-10-CM

## 2013-08-28 DIAGNOSIS — Z8546 Personal history of malignant neoplasm of prostate: Secondary | ICD-10-CM

## 2013-08-28 DIAGNOSIS — Z0000 Encounter for general adult medical examination without abnormal findings: Secondary | ICD-10-CM

## 2013-08-28 DIAGNOSIS — E119 Type 2 diabetes mellitus without complications: Secondary | ICD-10-CM

## 2013-08-28 NOTE — Progress Notes (Signed)
Subjective:    Patient ID: Alfred Foster, male    DOB: [birth year], 67 y.o.   MRN: [id number]  HPI Medicare Wellness Visit:  Psychosocial & medical history were reviewed as required by Medicare (abuse,antisocial behavioral risks,firearm risk).  Social history: caffeine: occasional cola & tea  , alcohol: no  ,  tobacco use: no   Exercise :  calisthentics 20-30 min 2X / week Home & personal  safety / fall risk:no Limitations of activities of daily living:no Seatbelt  and smoke alarm use:yes Power of Attorney/Living Will status : needed Ophthalmology exam status :current Hearing evaluation status:not current Orientation :oriented X 3 Memory & recall :required 3 tries Math testing: Active depression / anxiety:denied Foreign travel history : never Immunization status for Shingles /Flu/ PNA/ tetanus : Flu needed Transfusion history:  no Preventive health surveillance status of colonoscopy as per protocol/ SOC: Dental care: every 4 mos  Chart reviewed &  Updated. Active issues reviewed & addressed.      Review of Systems HYPERTENSION: Disease Monitoring: Blood pressure range 140s/80s Compliant with present antihypertensive regimen   DIABETES /PMH of FASTING HYPERGLYCEMIA : Disease Monitoring: Blood Sugar:  average 101; 2 hr post meal , 152  Medication Compliance: no diabetic meds Diet: low salt  Ophth exam current Podiatry exam not current  Hypoglycemic symptoms:no  HYPERLIPIDEMIA: Diet & exercise as noted Medication Compliance:  Statin taken  No FH premature CAD / CVA   Past medical history/family history/social history were all reviewed and updated.    Signs & symptoms not present or negative as noted below: No significant headaches No chest pain, palpitations , claudications, PNDyspnea    Occasional  Dyspnea with waist flexion but not with exertion. No lightheadedness or syncope   No edema No polyuria/phagia/dipsia No limb numbness & tingling or weakness No non  healing skin lesions      No blurred , double or loss of vision Occasional mild mid abd pain. He denies significant dyspepsia, dysphagia, melena, or rectal bleeding. Is Colonoscopy negative 2014. No bowel changes No significant myalgias            Objective:   Physical Exam Gen.: Healthy and well-nourished in appearance. Alert, appropriate and cooperative throughout exam.  Head: Normocephalic without obvious abnormalities;  pattern alopecia  Eyes: No corneal or conjunctival inflammation noted. Pupils equal round reactive to light and accommodation. Slight lid lag. Extraocular motion intact. Vision grossly normal w/o lenses. Ears: External  ear exam reveals minor deformities. Canals clear .TMs normal. Hearing is grossly normal bilaterally. Nose: External nasal exam reveals no deformity or inflammation. Nasal mucosa are pink and moist. No lesions or exudates noted.   Mouth: Oral mucosa and oropharynx reveal no lesions or exudates. Teeth in good repair. Neck: No deformities, masses, or tenderness noted. Range of motion & Thyroid normal. Lungs: Normal respiratory effort; chest expands symmetrically. Lungs are clear to auscultation without rales, wheezes, or increased work of breathing. Heart: Normal rate and rhythm. Normal S1 and S2. No gallop, click, or rub. No murmur. Abdomen: Bowel sounds normal; abdomen soft and nontender. No masses, organomegaly or hernias noted.                   GU: as per Dr Laverle Patter                Musculoskeletal/extremities: No deformity or scoliosis noted of  the thoracic or lumbar spine. No clubbing, cyanosis, edema, or significant extremity  deformity noted. Range of motion normal .Tone &  strength  normal.Joints  reveal mild  DJD DIP changes. Nail health good. Able to lie down & sit up w/o help. Negative SLR bilaterally Vascular: Carotid, radial artery, dorsalis pedis and  posterior tibial pulses are full and equal. No bruits present. Neurologic: Alert and oriented  x3. Deep  tendon reflexes symmetrical and normal. Light touch normal over feet.          Skin: Intact without suspicious lesions or rashes. Lymph: No cervical, axillary lymphadenopathy present. Psych: Mood and affect are normal. Normally interactive                                                                                          Assessment & Plan:  #1 Medicare Wellness Exam; criteria met ; data entered #2 Problem List/Diagnoses reviewed Plan:  Assessments made/ Orders entered You need  rightto

## 2013-08-28 NOTE — Patient Instructions (Addendum)
Eat a low-fat diet with lots of fruits and vegetables, up to 7-9 servings per day. Consume less than 40 Grams (preferably ZERO) of sugar per day from foods & drinks with High Fructose Corn Syrup (HFCS) sugar as #1,2,3 or # 4 on label.  White carbohydrates (potatoes, rice, bread, and pasta) have a high spike of sugar and a high load of sugar. For example a  baked potato has a cup of sugar and a  french fry  2 teaspoons of sugar. Yams, wild  rice, whole grained bread &  wheat pasta have been much lower spike and load of  sugar. Portions should be the size of a deck of cards or your palm. Please  schedule fasting Labs in 12 weeks after nutrition changes: TSH, A1c. PLEASE BRING THESE INSTRUCTIONS TO FOLLOW UP  LAB APPOINTMENT.This will guarantee correct labs are drawn, eliminating need for repeat blood sampling ( needle sticks ! ). Diagnoses /Codes: 272.4,250.00.

## 2013-09-17 ENCOUNTER — Ambulatory Visit (INDEPENDENT_AMBULATORY_CARE_PROVIDER_SITE_OTHER): Payer: Medicare Other

## 2013-09-17 DIAGNOSIS — Z23 Encounter for immunization: Secondary | ICD-10-CM

## 2013-10-24 ENCOUNTER — Encounter: Payer: Medicare Other | Admitting: Internal Medicine

## 2013-10-28 ENCOUNTER — Telehealth: Payer: Self-pay | Admitting: Internal Medicine

## 2013-10-28 NOTE — Telephone Encounter (Addendum)
Patient called and stated that he received a phone call on saturday about needing to schedule a lab apt to have a urine diabetes test done while having his A1C checked. Patient is scheduled to come in tomorrow at 8:30am. Please advise me. Thanks

## 2013-10-29 ENCOUNTER — Other Ambulatory Visit (INDEPENDENT_AMBULATORY_CARE_PROVIDER_SITE_OTHER): Payer: Medicare Other

## 2013-10-29 DIAGNOSIS — E119 Type 2 diabetes mellitus without complications: Secondary | ICD-10-CM

## 2013-10-29 DIAGNOSIS — E785 Hyperlipidemia, unspecified: Secondary | ICD-10-CM

## 2013-10-29 LAB — TSH: TSH: 1.2 u[IU]/mL (ref 0.35–5.50)

## 2013-11-01 LAB — MICROALBUMIN / CREATININE URINE RATIO
Creatinine,U: 193.2 mg/dL
Microalb Creat Ratio: 0.8 mg/g (ref 0.0–30.0)

## 2013-11-01 NOTE — Telephone Encounter (Signed)
Pt was seen urine collected for microalbumin.//AB/CMA

## 2013-11-04 ENCOUNTER — Encounter: Payer: Self-pay | Admitting: *Deleted

## 2013-12-20 ENCOUNTER — Other Ambulatory Visit: Payer: Self-pay | Admitting: Internal Medicine

## 2013-12-20 ENCOUNTER — Telehealth: Payer: Self-pay | Admitting: Internal Medicine

## 2013-12-20 DIAGNOSIS — E119 Type 2 diabetes mellitus without complications: Secondary | ICD-10-CM

## 2013-12-20 NOTE — Telephone Encounter (Signed)
Please advise.//AB/CMA 

## 2013-12-20 NOTE — Telephone Encounter (Signed)
Patient's wife  is calling to request a referral for her husband to be seen at the nutrition center so that they can inquire about a diabetes portion control plate. Please advise.

## 2013-12-20 NOTE — Telephone Encounter (Signed)
done

## 2014-01-14 ENCOUNTER — Other Ambulatory Visit: Payer: Self-pay | Admitting: Internal Medicine

## 2014-01-15 ENCOUNTER — Other Ambulatory Visit: Payer: Self-pay | Admitting: Internal Medicine

## 2014-01-15 NOTE — Telephone Encounter (Signed)
Rx sent to the pharmacy,//AB/CMA

## 2014-01-15 NOTE — Telephone Encounter (Signed)
Rx sent to the pharmacy.//AB/CMA 

## 2014-01-24 ENCOUNTER — Ambulatory Visit (INDEPENDENT_AMBULATORY_CARE_PROVIDER_SITE_OTHER): Payer: Medicare Other | Admitting: Internal Medicine

## 2014-01-24 ENCOUNTER — Encounter: Payer: Self-pay | Admitting: Internal Medicine

## 2014-01-24 VITALS — BP 140/80 | HR 86 | Temp 98.7°F | Wt 241.0 lb

## 2014-01-24 DIAGNOSIS — J310 Chronic rhinitis: Secondary | ICD-10-CM

## 2014-01-24 MED ORDER — AMOXICILLIN 500 MG PO CAPS: 500.0000 mg | ORAL_CAPSULE | Freq: Three times a day (TID) | ORAL | Status: DC

## 2014-01-24 NOTE — Patient Instructions (Signed)
Plain Mucinex (NOT D) for thick secretions ;force NON dairy fluids .   Nasal cleansing in the shower as discussed with lather of mild shampoo.After 10 seconds wash off lather while  exhaling through nostrils. Make sure that all residual soap is removed to prevent irritation.  Flonase OR Nasacort AQ 1 spray in each nostril twice a day as needed. Use the "crossover" technique into opposite nostril spraying toward opposite ear @ 45 degree angle, not straight up into nostril.  Use a Neti pot daily only  as needed for significant sinus congestion; going from open side to congested side . Plain Allegra (NOT D )  160 daily , Loratidine 10 mg , OR Zyrtec 10 mg @ bedtime  as needed for itchy eyes & sneezing. Fill the  prescription for Amoxicillin it there is not dramatic improvement in the next 72 hours.

## 2014-01-24 NOTE — Progress Notes (Signed)
   Subjective:    Patient ID: Alfred Foster, male    DOB: 02-03-1946, 68 y.o.   MRN: [id number]  HPI    The symptoms began 12/20/13 and sore throat which is resolved with over-the-counter cough drops. He has developed a nonproductive cough. He describes clear nasal secretions   Review of Systems He is not having frontal headache, facial pain, nasal purulence, dental pain, otic pain, or otic discharge  He also denies fever, chills, or sweats  There is no associated shortness of breath or wheezing with the cough.        Objective:   Physical Exam General appearance: thin but in good health ;well nourished; no acute distress or increased work of breathing is present.  No  lymphadenopathy about the head, neck, or axilla noted.   Eyes: No conjunctival inflammation or lid edema is present.  Ears:  External ear exam shows mild deformities.  Otoscopic examination reveals clear canals, tympanic membranes are intact bilaterally without bulging, retraction, inflammation or discharge.  Nose:  External nasal examination shows no deformity or inflammation. Nasal mucosa are pink and moist without lesions or exudates. No septal dislocation or deviation.No obstruction to airflow.   Oral exam: Dental hygiene is good; lips and gums are healthy appearing.There is no oropharyngeal erythema or exudate noted.   Neck:  No deformities, masses, or tenderness noted.   Supple with full range of motion without pain.   Heart:  Normal rate and regular rhythm. S1 and S2 normal without gallop, murmur, click, rub or other extra sounds. S4  Lungs:Chest clear to auscultation; no wheezes, rhonchi,rales ,or rubs present.No increased work of breathing.    Extremities:  No cyanosis, edema, or clubbing  noted    Skin: Warm & dry .                            Assessment & Plan:  #1 rhinitis See AVS

## 2014-01-24 NOTE — Progress Notes (Signed)
Pre visit review using our clinic review tool, if applicable. No additional management support is needed unless otherwise documented below in the visit note. 

## 2014-01-30 ENCOUNTER — Ambulatory Visit: Payer: Medicare Other | Admitting: *Deleted

## 2014-02-07 ENCOUNTER — Other Ambulatory Visit: Payer: Self-pay | Admitting: Internal Medicine

## 2014-03-11 ENCOUNTER — Encounter: Payer: Medicare Other | Attending: Internal Medicine | Admitting: Dietician

## 2014-03-11 ENCOUNTER — Encounter: Payer: Self-pay | Admitting: Dietician

## 2014-03-11 VITALS — Ht 73.5 in | Wt 241.4 lb

## 2014-03-11 DIAGNOSIS — E119 Type 2 diabetes mellitus without complications: Secondary | ICD-10-CM | POA: Insufficient documentation

## 2014-03-11 DIAGNOSIS — Z713 Dietary counseling and surveillance: Secondary | ICD-10-CM | POA: Insufficient documentation

## 2014-03-11 NOTE — Progress Notes (Signed)
  Medical Nutrition Therapy:  Appt start time: 0915 end time:  1030.   Assessment:  Primary concerns today: Alfred Foster is at the appointment today with his wife and has diabetes for several years. Not taking any medication for diabetes and last Hgb A1c was 6.9% in 11/14. Testing glucose 2 x week fasting and 2 hours after meals and averaging 101-117 mg/dl fasting 120-145 mg/dl.  Lives with his wife and son and states that his wife does most of the preparation and he does shopping. Previously came to [facility] for diabetes education after being diagnosed. States he did not learn about carbohydrate counting.  Would like to lose his "pot belly" and would like to lose about 20-30 lbs. Trying to watch portion sizes, not going for seconds, and lost about 4 lbs since December. Goes out to eat 3-5 x month.  Preferred Learning Style:  No preference indicated   Learning Readiness:   Ready   MEDICATIONS: see list, no diabetes medication   DIETARY INTAKE:  24-hr recall:  B ( AM): cereal (honey oats) with milk or eggs, grits, bacon or waffle, applesauce, juice or sausage biscuit Snk ( AM): sometimes will have peanut butter crackers or cheese and crackers with water  L ( PM): hot dogs, hamburgers, pizza, BLT, grilled cheese, salads with diet drink or water or tea Snk ( PM): not usually, sometimes fruit D ( PM): rice, mashed pototoes, pork chop, meatloaf, steak, salad, carrots, with water or tea or diet drink  Snk ( PM): none Beverages: water, juice, sweet tea  Usual physical activity: mow lawn, sit ups, squats at home, housework every day 15-30 minutes  Estimated energy needs: 2000 calories 225 g carbohydrates 150 g protein 56 g fat  Progress Towards Goal(s):  In progress.   Nutritional Diagnosis:  [place]-3.3 Overweight/obesity As related to hx of large portion sizes including carbohydrates.  As evidenced by BMI of 31.4.    Intervention:  Nutrition counseling provided. Plan: Aim to go to the gym 2  x week for about 30-60 minutes. Continue being active around house most days. Fill half of your plate with vegetables and limit starches to a quarter of your plate (think about choosing one starch/carb to have per meal). Consider using smaller plates, bowls, or glasses to help with portion size. Measure out portions of cereal. For snacks, have protein along with carbohydrates. Drink mostly water, and limit the amount sweet tea or juice.   Teaching Method Utilized:  Visual Auditory Hands on  Handouts given during visit include:  Living Well with Diabetes  Yellow Card  15 g CHO Snacks  MyPlate Handout  Barriers to learning/adherence to lifestyle change: none  Demonstrated degree of understanding via:  Teach Back   Monitoring/Evaluation:  Dietary intake, exercise, and body weight prn.

## 2014-03-11 NOTE — Patient Instructions (Signed)
Aim to go to the gym 2 x week for about 30-60 minutes. Continue being active around house most days. Fill half of your plate with vegetables and limit starches to a quarter of your plate (think about choosing one starch/carb to have per meal). Consider using smaller plates, bowls, or glasses to help with portion size. Measure out portions of cereal. For snacks, have protein along with carbohydrates. Drink mostly water, and limit the amount sweet tea or juice.

## 2014-07-15 ENCOUNTER — Other Ambulatory Visit: Payer: Self-pay

## 2014-07-15 MED ORDER — LOSARTAN POTASSIUM 100 MG PO TABS: ORAL_TABLET | ORAL | Status: DC

## 2014-09-01 ENCOUNTER — Encounter: Payer: Medicare Other | Admitting: Internal Medicine

## 2014-09-03 ENCOUNTER — Other Ambulatory Visit (INDEPENDENT_AMBULATORY_CARE_PROVIDER_SITE_OTHER): Payer: Medicare Other

## 2014-09-03 ENCOUNTER — Encounter: Payer: Self-pay | Admitting: Internal Medicine

## 2014-09-03 ENCOUNTER — Ambulatory Visit (INDEPENDENT_AMBULATORY_CARE_PROVIDER_SITE_OTHER): Payer: Medicare Other | Admitting: Internal Medicine

## 2014-09-03 VITALS — BP 148/90 | HR 75 | Temp 97.9°F | Resp 13 | Wt 226.2 lb

## 2014-09-03 DIAGNOSIS — E119 Type 2 diabetes mellitus without complications: Secondary | ICD-10-CM

## 2014-09-03 DIAGNOSIS — I10 Essential (primary) hypertension: Secondary | ICD-10-CM

## 2014-09-03 DIAGNOSIS — Z23 Encounter for immunization: Secondary | ICD-10-CM

## 2014-09-03 DIAGNOSIS — E785 Hyperlipidemia, unspecified: Secondary | ICD-10-CM

## 2014-09-03 DIAGNOSIS — Z0000 Encounter for general adult medical examination without abnormal findings: Secondary | ICD-10-CM

## 2014-09-03 LAB — LIPID PANEL
CHOLESTEROL: 134 mg/dL (ref 0–200)
HDL: 32.4 mg/dL — AB (ref >39.00)
LDL Cholesterol: 94 mg/dL (ref 0–99)
NONHDL: 101.6
TRIGLYCERIDES: 40 mg/dL (ref 0.0–149.0)
Total CHOL/HDL Ratio: 4
VLDL: 8 mg/dL (ref 0.0–40.0)

## 2014-09-03 LAB — TSH: TSH: 0.9 u[IU]/mL (ref 0.35–4.50)

## 2014-09-03 LAB — BASIC METABOLIC PANEL
BUN: 19 mg/dL (ref 6–23)
CO2: 26 mEq/L (ref 19–32)
CREATININE: 1.4 mg/dL (ref 0.4–1.5)
Calcium: 8.9 mg/dL (ref 8.4–10.5)
Chloride: 109 mEq/L (ref 96–112)
GFR: 65.52 mL/min (ref >60.00)
Glucose, Bld: 113 mg/dL — ABNORMAL HIGH (ref 70–99)
Potassium: 4 mEq/L (ref 3.5–5.1)
SODIUM: 139 meq/L (ref 135–145)

## 2014-09-03 LAB — HEPATIC FUNCTION PANEL
ALBUMIN: 4 g/dL (ref 3.5–5.2)
ALT: 24 U/L (ref 0–53)
AST: 22 U/L (ref 0–37)
Alkaline Phosphatase: 95 U/L (ref 39–117)
Bilirubin, Direct: 0.1 mg/dL (ref 0.0–0.3)
Total Bilirubin: 0.6 mg/dL (ref 0.2–1.2)
Total Protein: 7.7 g/dL (ref 6.0–8.3)

## 2014-09-03 LAB — HEMOGLOBIN A1C: HEMOGLOBIN A1C: 6.8 % — AB (ref 4.6–6.5)

## 2014-09-03 LAB — MICROALBUMIN / CREATININE URINE RATIO
Creatinine,U: 207.9 mg/dL
MICROALB/CREAT RATIO: 0.3 mg/g (ref 0.0–30.0)
Microalb, Ur: 0.7 mg/dL (ref 0.0–1.9)

## 2014-09-03 NOTE — Assessment & Plan Note (Signed)
Blood pressure goals reviewed. BMET 

## 2014-09-03 NOTE — Assessment & Plan Note (Signed)
Lipids, LFTs, TSH  

## 2014-09-03 NOTE — Patient Instructions (Signed)

## 2014-09-03 NOTE — Progress Notes (Signed)
Subjective:    Patient ID: Alfred Foster, male    DOB: [birth year], 68 y.o.   MRN: [id number]  HPI  [employer]/ Medicare Wellness Visit: Psychosocial and medical history were reviewed as required by Medicare (history related to abuse, antisocial behavior , firearm risk). Social history: Caffeine: none , Alcohol: none , Tobacco use: none Exercise:see below Personal safety/fall risk:no Limitations of activities of daily living:no Seatbelt/ smoke alarm use:yes Healthcare Power of Attorney/Living Will status: not UTD Ophthalmologic exam status:UTD Hearing evaluation status:not UTD Orientation: Oriented X 3 Memory and recall: good Spelling  testing: good  Depression/anxiety assessment: no Foreign travel history:never Immunization status for influenza/pneumonia/ shingles /tetanus: Flu today Transfusion history:no Preventive health care maintenance status: Colonoscopy as per protocol/standard care: UTD Dental care:every 4-6 mos Chart reviewed and updated. Active issues reviewed and addressed as documented below.   He is on a modified heart healthy diet; he will have some fried foods. He engages in calisthentics almost daily up to 180 minutes. His only symptom is occasional exertional dyspnea He has been compliant with his statin &  has no adverse effects. Blood pressure average is 140/85. Fasting glucoses average 99-110. 2 hours after any meal glucose ranges from 130 to 150.   Review of Systems   He specifically denies chest pain, palpitations, claudication, edema, paroxysmal nocturnal dyspnea  He also denies any significant abdominal symptoms, muscle pain, or myalgias.     Objective:   Physical Exam   Positive or pertinent findings include: R ear deformed. Mustache and small goatee. Head shaven. He has mild degenerative joint changes of the DIP joints of the hands. He has no crepitus of the knees greater on the left than the right.  Gen.: Healthy and well-nourished in  appearance. Alert, appropriate and cooperative throughout exam. Appears younger than stated age  Head: Normocephalic without obvious abnormalities  Eyes: No corneal or conjunctival inflammation noted. Pupils equal round reactive to light and accommodation. Extraocular motion intact.  Ears: External  ear exam reveals no significant lesions or deformities. Canals clear .TMs normal. Hearing is grossly normal bilaterally. Nose: External nasal exam reveals no deformity or inflammation. Nasal mucosa are pink and moist. No lesions or exudates noted.   Mouth: Oral mucosa and oropharynx reveal no lesions or exudates. Teeth in good repair. Neck: No deformities, masses, or tenderness noted. Range of motion & Thyroid normal. Lungs: Normal respiratory effort; chest expands symmetrically. Lungs are clear to auscultation without rales, wheezes, or increased work of breathing. Heart: Normal rate and rhythm. Normal S1 and S2. No gallop, click, or rub. No murmur. Abdomen: Bowel sounds normal; abdomen soft and nontender. No masses, organomegaly or hernias noted. Genitalia: Dr Alinda Money                       Musculoskeletal/extremities: No deformity or scoliosis noted of  the thoracic or lumbar spine.  No clubbing, cyanosis, edema, or significant extremity  deformity noted. Range of motion normal .Tone & strength normal.  Fingernail health good. Able to lie down & sit up w/o help. Negative SLR bilaterally Vascular: Carotid, radial artery, dorsalis pedis and  posterior tibial pulses are full and equal. No bruits present. Neurologic: Alert and oriented x3. Deep tendon reflexes symmetrical and normal.  Gait normal .    Skin: Intact without suspicious lesions or rashes. Lymph: No cervical, axillary lymphadenopathy present. Psych: Mood and affect are normal. Normally interactive  Assessment & Plan:  See Current Assessment & Plan  in Problem List under specific DiagnosisThe labs will be reviewed and risks and options assessed. Written recommendations will be provided by mail or directly through My Chart.Further evaluation or change in medical therapy will be directed by those results.

## 2014-09-03 NOTE — Progress Notes (Signed)
Pre visit review using our clinic review tool, if applicable. No additional management support is needed unless otherwise documented below in the visit note. 

## 2014-09-03 NOTE — Assessment & Plan Note (Signed)
A1c , urine microalbumin, BMET 

## 2014-09-07 ENCOUNTER — Other Ambulatory Visit: Payer: Self-pay | Admitting: Internal Medicine

## 2014-09-07 DIAGNOSIS — E119 Type 2 diabetes mellitus without complications: Secondary | ICD-10-CM

## 2014-09-07 MED ORDER — METFORMIN HCL 500 MG PO TABS: ORAL_TABLET | ORAL | Status: DC

## 2014-10-13 ENCOUNTER — Telehealth: Payer: Self-pay | Admitting: *Deleted

## 2014-10-13 NOTE — Telephone Encounter (Signed)
Pt is wanting to schedule prevnar injection for husband. Made nurse visit for 10/28/14...Alfred Foster

## 2014-10-28 ENCOUNTER — Ambulatory Visit (INDEPENDENT_AMBULATORY_CARE_PROVIDER_SITE_OTHER): Payer: Medicare Other | Admitting: *Deleted

## 2014-10-28 DIAGNOSIS — Z23 Encounter for immunization: Secondary | ICD-10-CM

## 2015-01-19 ENCOUNTER — Other Ambulatory Visit: Payer: Self-pay

## 2015-01-19 MED ORDER — LOSARTAN POTASSIUM 100 MG PO TABS: ORAL_TABLET | ORAL | Status: DC

## 2015-02-16 ENCOUNTER — Other Ambulatory Visit: Payer: Self-pay | Admitting: Internal Medicine

## 2015-03-05 DIAGNOSIS — E119 Type 2 diabetes mellitus without complications: Secondary | ICD-10-CM | POA: Diagnosis not present

## 2015-03-05 DIAGNOSIS — H25013 Cortical age-related cataract, bilateral: Secondary | ICD-10-CM | POA: Diagnosis not present

## 2015-03-05 DIAGNOSIS — H2513 Age-related nuclear cataract, bilateral: Secondary | ICD-10-CM | POA: Diagnosis not present

## 2015-03-05 LAB — HM DIABETES EYE EXAM

## 2015-03-19 ENCOUNTER — Ambulatory Visit (INDEPENDENT_AMBULATORY_CARE_PROVIDER_SITE_OTHER): Payer: Medicare Other | Admitting: Internal Medicine

## 2015-03-19 ENCOUNTER — Encounter: Payer: Self-pay | Admitting: Internal Medicine

## 2015-03-19 ENCOUNTER — Other Ambulatory Visit (INDEPENDENT_AMBULATORY_CARE_PROVIDER_SITE_OTHER): Payer: Medicare Other

## 2015-03-19 VITALS — BP 138/80 | HR 82 | Temp 97.8°F | Resp 17 | Ht 73.5 in | Wt 233.2 lb

## 2015-03-19 DIAGNOSIS — D171 Benign lipomatous neoplasm of skin and subcutaneous tissue of trunk: Secondary | ICD-10-CM | POA: Diagnosis not present

## 2015-03-19 DIAGNOSIS — I10 Essential (primary) hypertension: Secondary | ICD-10-CM

## 2015-03-19 DIAGNOSIS — E119 Type 2 diabetes mellitus without complications: Secondary | ICD-10-CM | POA: Diagnosis not present

## 2015-03-19 DIAGNOSIS — E785 Hyperlipidemia, unspecified: Secondary | ICD-10-CM

## 2015-03-19 LAB — LIPID PANEL
CHOL/HDL RATIO: 4
CHOLESTEROL: 144 mg/dL (ref 0–200)
HDL: 33.6 mg/dL — ABNORMAL LOW (ref >39.00)
LDL CALC: 99 mg/dL (ref 0–99)
NonHDL: 110.4
Triglycerides: 55 mg/dL (ref 0.0–149.0)
VLDL: 11 mg/dL (ref 0.0–40.0)

## 2015-03-19 LAB — BASIC METABOLIC PANEL
BUN: 18 mg/dL (ref 6–23)
CHLORIDE: 106 meq/L (ref 96–112)
CO2: 28 meq/L (ref 19–32)
Calcium: 9.5 mg/dL (ref 8.4–10.5)
Creatinine, Ser: 1.4 mg/dL (ref 0.40–1.50)
GFR: 64.87 mL/min (ref >60.00)
Glucose, Bld: 119 mg/dL — ABNORMAL HIGH (ref 70–99)
Potassium: 4.9 mEq/L (ref 3.5–5.1)
Sodium: 138 mEq/L (ref 135–145)

## 2015-03-19 LAB — HEMOGLOBIN A1C: Hgb A1c MFr Bld: 6.6 % — ABNORMAL HIGH (ref 4.6–6.5)

## 2015-03-19 LAB — TSH: TSH: 1.19 u[IU]/mL (ref 0.35–4.50)

## 2015-03-19 LAB — HEPATIC FUNCTION PANEL
ALT: 22 U/L (ref 0–53)
AST: 21 U/L (ref 0–37)
Albumin: 4.1 g/dL (ref 3.5–5.2)
Alkaline Phosphatase: 94 U/L (ref 39–117)
BILIRUBIN DIRECT: 0.2 mg/dL (ref 0.0–0.3)
BILIRUBIN TOTAL: 0.8 mg/dL (ref 0.2–1.2)
Total Protein: 7.4 g/dL (ref 6.0–8.3)

## 2015-03-19 LAB — MICROALBUMIN / CREATININE URINE RATIO
Creatinine,U: 199.6 mg/dL
MICROALB UR: 1 mg/dL (ref 0.0–1.9)
Microalb Creat Ratio: 0.5 mg/g (ref 0.0–30.0)

## 2015-03-19 NOTE — Assessment & Plan Note (Signed)
Blood pressure goals reviewed. BMET 

## 2015-03-19 NOTE — Assessment & Plan Note (Signed)
A1c , urine microalbumin, BMET 

## 2015-03-19 NOTE — Progress Notes (Signed)
Pre visit review using our clinic review tool, if applicable. No additional management support is needed unless otherwise documented below in the visit note. 

## 2015-03-19 NOTE — Assessment & Plan Note (Signed)
Surgery referral

## 2015-03-19 NOTE — Patient Instructions (Signed)
Minimal Blood Pressure Goal= AVERAGE < 140/90;  Ideal is an AVERAGE < 135/85. This AVERAGE should be calculated from @ least 5-7 BP readings taken @ different times of day on different days of week. You should not respond to isolated BP readings , but rather the AVERAGE for that week .Please bring your  blood pressure cuff to office visits to verify that it is reliable.It  can also be checked against the blood pressure device at the pharmacy. Finger or wrist cuffs are not dependable; an arm cuff is. Your next office appointment will be determined based upon review of your pending labs .Those instructions will be transmitted to you by mail for your records. Critical results will be called. Please report any significant change in your symptoms.

## 2015-03-19 NOTE — Assessment & Plan Note (Signed)
Lipids, LFTs, TSH  

## 2015-03-19 NOTE — Progress Notes (Signed)
   Subjective:    Patient ID: [name], male    DOB: Sep 23, 1946, 69 y.o.   MRN: [id number]  HPI The patient is here to assess status of active health conditions.  PMH, FH, & Social History reviewed & updated.  Diet/ nutrition: Low-sodium, decreased hyperglycemic carbs Exercise program: Calisthenics 10-15 minutes 3-4 times per week without cardio pulmonary symptoms  HYPERTENSION: Disease Monitoring: Blood pressure rarely checked except at doctor visits. Most recent systolic was 810 Medication Compliance: On ARB without adverse effects  Diabetes :  FBS range/average: Fasting blood sugar 109-144 Highest 2 hr post meal glucose: Rarely checked; last value was 152 Medication compliance: No longer on metformin Hypoglycemia: No Ophthamology care: Up-to-date; early cataracts. No diabetic retinopathy Podiatry care: Not up-to-date  HYPERLIPIDEMIA: Disease Monitoring: Medication Compliance: On pravastatin without adverse effects      Review of Systems   Chest pain, palpitations: No      Dyspnea: Not with exertion. He does have dyspnea with waist flexion Edema: No Claudication: No Lightheadedness,Syncope: No Weight gain/loss: Weight stable Polyuria/phagia/dipsia:   No   Blurred vision /diplopia/lossof vision: No Limb numbness/tingling/burning: No Non healing skin lesions: No Abd pain, bowel changes: No. Colonoscopy up-to-date; it was -7/14. No dysphagia, melena, rectal bleeding.  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 It is not painful and there is been no associated change in color or temperature of this lesion.    Objective:   Physical Exam  Pertinent or positive findings include: Pattern alopecia is present.  He has a mustache.  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 This does transilluminate.  There is no organomegaly specifically there is no  splenomegaly.   General appearance :adequately nourished; in no distress. Eyes: No conjunctival inflammation or scleral icterus is present. Oral exam:  Lips and gums are healthy appearing.There is no oropharyngeal erythema or exudate noted. Dental hygiene is good. Heart:  Normal rate and regular rhythm. S1 and S2 normal without gallop, murmur, click, rub or other extra sounds   Lungs:Chest clear to auscultation; no wheezes, rhonchi,rales ,or rubs present.No increased work of breathing.  Abdomen: bowel sounds normal, soft and non-tender without masses, or hernias noted.  No guarding or rebound.  Vascular : all pulses equal ; no bruits present. Skin:Warm & dry.  Intact without suspicious lesions or rashes ; no tenting  Lymphatic: No lymphadenopathy is noted about the head, neck, axilla Neuro: Strength, tone & DTRs normal.        Assessment & Plan:  See Current Assessment & Plan in Problem List under specific Diagnosis

## 2015-03-24 ENCOUNTER — Encounter: Payer: Self-pay | Admitting: Internal Medicine

## 2015-03-27 ENCOUNTER — Telehealth: Payer: Self-pay | Admitting: Internal Medicine

## 2015-03-27 NOTE — Telephone Encounter (Signed)
Phone call to patient. I spoke to his wife and patient was beside her.  I let her know the value of the a1c, that is what she had the question on.

## 2015-03-27 NOTE — Telephone Encounter (Signed)
Please call patient to explain his labs

## 2015-06-05 ENCOUNTER — Ambulatory Visit: Payer: Self-pay | Admitting: Surgery

## 2015-06-05 DIAGNOSIS — D171 Benign lipomatous neoplasm of skin and subcutaneous tissue of trunk: Secondary | ICD-10-CM | POA: Diagnosis not present

## 2015-06-05 NOTE — H&P (Signed)
Alfred Foster 06/05/2015 9:26 AM Location: [facility] Patient #: [id number] DOB: 30-Jan-1946 Married / Language: English / Race: Black or African American Male History of Present Illness Alfred Moores A. Emary Zalar MD; 06/05/2015 12:34 PM) Patient words: lipoma on adb  PT HAS NODULE LEFT ABDOMINAL AREA LATERAL TO BELLY [place]TON FOR MANY YEARS. It is getteng larger. Mild tenderness to palpation and uncomfortable. No redness or drainage. It moves around.  The patient is a 69 year old male   Other Problems Alfred Foster, Alfred Foster; 06/05/2015 9:26 AM) Diabetes Mellitus High blood pressure Hypercholesterolemia Prostate Cancer  Past Surgical History Alfred Foster; 06/05/2015 9:26 AM) Prostate Surgery - Removal  Diagnostic Studies History Alfred Foster; 06/05/2015 9:26 AM) Colonoscopy 1-5 years ago  Allergies Alfred Foster; 06/05/2015 9:27 AM) No Known Drug Allergies 06/05/2015  Medication History Alfred Foster; 06/05/2015 9:30 AM) Chlorhexidine Gluconate (0.12% Solution, Mouth/Throat) Active. Losartan Potassium (100MG  Tablet, Oral) Active. Pravastatin Sodium (40MG  Tablet, Oral) Active. Ascorbic Acid (Oral) Active. Aspirin (81MG  Tablet, Oral) Active. Folic Acid (209OBS Tablet, Oral) Active. Niacin (500MG  Tablet, Oral) Active. Omega-3 Fatty Acids (Oral) Active. One Touch Test Strips (In Vitro) Active. Medications Reconciled  Social History Alfred Foster, [place]; 06/05/2015 9:26 AM) Caffeine use Carbonated beverages, Tea. No alcohol use No drug use Tobacco use Never smoker.  Family History Alfred Foster, [place]; 06/05/2015 9:26 AM) Arthritis Alfred Foster, Alfred Foster, Alfred Foster. Diabetes Mellitus Alfred Foster, Alfred Foster. Heart Disease Alfred Foster. Hypertension Alfred Foster, Alfred Foster, Alfred Foster, Alfred Foster, Alfred Foster. Respiratory Condition Alfred Foster.     Review of Systems Alfred Foster Foster; 06/05/2015 9:26 AM) General Present- Fatigue. Not Present- Appetite Loss, Chills, Fever, Night Sweats, Weight Gain  and Weight Loss. Skin Not Present- Change in Wart/Mole, Dryness, Hives, Jaundice, New Lesions, Non-Healing Wounds, Rash and Ulcer. HEENT Present- Wears glasses/contact lenses. Not Present- Earache, Hearing Loss, Hoarseness, Nose Bleed, Oral Ulcers, Ringing in the Ears, Seasonal Allergies, Sinus Pain, Sore Throat, Visual Disturbances and Yellow Eyes. Respiratory Present- Snoring. Not Present- Bloody sputum, Chronic Cough, Difficulty Breathing and Wheezing. Breast Not Present- Breast Mass, Breast Pain, Nipple Discharge and Skin Changes. Cardiovascular Present- Leg Cramps and Shortness of Breath. Not Present- Chest Pain, Difficulty Breathing Lying Down, Palpitations, Rapid Heart Rate and Swelling of Extremities. Gastrointestinal Present- Abdominal Pain and Bloating. Not Present- Bloody Stool, Change in Bowel Habits, Chronic diarrhea, Constipation, Difficulty Swallowing, Excessive gas, Gets full quickly at meals, Hemorrhoids, Indigestion, Nausea, Rectal Pain and Vomiting. Male Genitourinary Not Present- Blood in Urine, Change in Urinary Stream, Frequency, Impotence, Nocturia, Painful Urination, Urgency and Urine Leakage. Musculoskeletal Not Present- Back Pain, Joint Pain, Joint Stiffness, Muscle Pain, Muscle Weakness and Swelling of Extremities. Neurological Not Present- Decreased Memory, Fainting, Headaches, Numbness, Seizures, Tingling, Tremor, Trouble walking and Weakness. Psychiatric Not Present- Anxiety, Bipolar, Change in Sleep Pattern, Depression, Fearful and Frequent crying. Endocrine Not Present- Cold Intolerance, Excessive Hunger, Hair Changes, Heat Intolerance, Hot flashes and New Diabetes. Hematology Not Present- Easy Bruising, Excessive bleeding, Gland problems, HIV and Persistent Infections.  Vitals Alfred Foster Foster; 06/05/2015 9:31 AM) 06/05/2015 9:30 AM Weight: 228 lb Height: 73in Body Surface Area: 2.31 m Body Mass Index: 30.08 kg/m Temp.: 97.51F(Oral)  Pulse: 76 (Regular)   BP: 130/76 (Sitting, Left Arm, Standard)     Physical Exam (Alfred Longsworth A. Chrisma Hurlock MD; 06/05/2015 12:36 PM)  General Mental Status-Alert. General Appearance-Consistent with stated age. Hydration-Well hydrated. Voice-Normal.  Integumentary Note: 4 cm mobile SQ mass lobulated    Chest and Lung Exam Chest and lung exam reveals -quiet, even and easy respiratory effort with  no use of accessory muscles and on auscultation, normal breath sounds, no adventitious sounds and normal vocal resonance. Inspection Chest Wall - Normal. Back - normal.  Cardiovascular Cardiovascular examination reveals -normal heart sounds, regular rate and rhythm with no murmurs and normal pedal pulses bilaterally.  Abdomen Inspection Inspection of the abdomen reveals - No Hernias. Skin - Scar - no surgical scars. Palpation/Percussion Palpation and Percussion of the abdomen reveal - Soft, Non Tender, No Rebound tenderness, No Rigidity (guarding) and No hepatosplenomegaly. Auscultation Auscultation of the abdomen reveals - Bowel sounds normal.  Neurologic Neurologic evaluation reveals -alert and oriented x 3 with no impairment of recent or remote memory. Mental Status-Normal.  Musculoskeletal Normal Exam - Left-Upper Extremity Strength Normal and Lower Extremity Strength Normal. Normal Exam - Right-Upper Extremity Strength Normal and Lower Extremity Strength Normal.    Assessment & Plan (Alfred Millikan A. 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 RISK OF BLEEDING INFECTION, INJURY TO NERVE ARTEY VEIN, RECIRRENCE , AND NEED FOR OTHER SURGERY. HE WISHES TO PROCEED.  Current Plans Pt Education - Benign Tumors: discussed with patient and provided information. Pt Education - [employer] Free Text Education/Instructions: discussed with patient and provided information.

## 2015-08-04 ENCOUNTER — Other Ambulatory Visit: Payer: Self-pay | Admitting: Emergency Medicine

## 2015-08-04 MED ORDER — LOSARTAN POTASSIUM 100 MG PO TABS: ORAL_TABLET | ORAL | Status: DC

## 2015-08-26 ENCOUNTER — Telehealth: Payer: Self-pay

## 2015-08-26 NOTE — Telephone Encounter (Signed)
Call to introduce AWV; States his schedule the week of the 19th is pretty full but can see where AWV may benefit; Will plan to see Dr. Linna Darner this year and may consider separate AWV next year.

## 2015-09-07 ENCOUNTER — Encounter: Payer: Self-pay | Admitting: Internal Medicine

## 2015-09-07 ENCOUNTER — Ambulatory Visit (INDEPENDENT_AMBULATORY_CARE_PROVIDER_SITE_OTHER): Payer: Medicare Other | Admitting: Internal Medicine

## 2015-09-07 ENCOUNTER — Other Ambulatory Visit (INDEPENDENT_AMBULATORY_CARE_PROVIDER_SITE_OTHER): Payer: Medicare Other

## 2015-09-07 ENCOUNTER — Encounter: Payer: Self-pay | Admitting: Emergency Medicine

## 2015-09-07 VITALS — BP 150/90 | HR 70 | Temp 98.0°F | Resp 16 | Ht 74.0 in | Wt 225.0 lb

## 2015-09-07 DIAGNOSIS — I10 Essential (primary) hypertension: Secondary | ICD-10-CM

## 2015-09-07 DIAGNOSIS — E119 Type 2 diabetes mellitus without complications: Secondary | ICD-10-CM

## 2015-09-07 DIAGNOSIS — Z23 Encounter for immunization: Secondary | ICD-10-CM

## 2015-09-07 DIAGNOSIS — E785 Hyperlipidemia, unspecified: Secondary | ICD-10-CM | POA: Diagnosis not present

## 2015-09-07 LAB — BASIC METABOLIC PANEL
BUN: 13 mg/dL (ref 6–23)
CO2: 24 mEq/L (ref 19–32)
Calcium: 9 mg/dL (ref 8.4–10.5)
Chloride: 110 mEq/L (ref 96–112)
Creatinine, Ser: 1.17 mg/dL (ref 0.40–1.50)
GFR: 79.69 mL/min (ref >60.00)
Glucose, Bld: 111 mg/dL — ABNORMAL HIGH (ref 70–99)
POTASSIUM: 4.1 meq/L (ref 3.5–5.1)
SODIUM: 142 meq/L (ref 135–145)

## 2015-09-07 LAB — HEMOGLOBIN A1C: HEMOGLOBIN A1C: 6.6 % — AB (ref 4.6–6.5)

## 2015-09-07 MED ORDER — PRAVASTATIN SODIUM 40 MG PO TABS: ORAL_TABLET | ORAL | Status: DC

## 2015-09-07 MED ORDER — LOSARTAN POTASSIUM 100 MG PO TABS: ORAL_TABLET | ORAL | Status: DC

## 2015-09-07 NOTE — Progress Notes (Signed)
   Subjective:    Patient ID: Alfred Foster, male    DOB: Sep 09, 1946, 69 y.o.   MRN: [id number]  HPI The patient is here to assess status of active health conditions.  PMH, FH, & Social History reviewed & updated.No change in [place] as recorded.  He is on a heart healthy diet with no added salt. He does calisthenics 25-30 minutes every other day without cardiopulmonary symptoms. Colonoscopy is due in 2024. He has no active GI symptoms.  Fasting blood sugars range 103-115. Postprandial glucoses are 137-155. He denies any hypoglycemia. Ophthalmologic exam is up-to-date. He has not seen a podiatrist.  Lipids were done 03/19/15. HDL was 33.6; LDL was 99. Triglycerides were 55.  Review of Systems  Chest pain, palpitations, tachycardia, exertional dyspnea, paroxysmal nocturnal dyspnea, claudication or edema are absent. No unexplained weight loss, abdominal pain, significant dyspepsia, dysphagia, melena, rectal bleeding, or persistently small caliber stools. Dysuria, pyuria, hematuria, frequency, nocturia or polyuria are denied. Change in hair, skin, nails denied. No bowel changes of constipation or diarrhea. No intolerance to heat or cold. New. He denies numbness, tingling, burning in extremities. He has no nonhealing skin lesions.    Objective:   Physical Exam  Pertinent or positive findings include: His head is shaven. 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 He has unsustained lateral nystagmus with lateral gaze bilaterally. 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 He has crepitus of the knees. There is some deformity of the second toenail bilaterally. GU exam deferred to Dr. Alinda Money.  General appearance :adequately nourished; in no distress.  Eyes: No conjunctival inflammation or scleral icterus is present.  Oral exam:  Lips and gums are healthy appearing.There is no oropharyngeal erythema or exudate noted. Dental hygiene is good.  Heart:  Normal rate and regular rhythm. S1 and S2 normal without gallop,  murmur, click, rub or other extra sounds    Lungs:Chest clear to auscultation; no wheezes, rhonchi,rales ,or rubs present.No increased work of breathing.   Abdomen: bowel sounds normal, soft and non-tender without masses, organomegaly or hernias noted.  No guarding or rebound.   Vascular : all pulses equal ; no bruits present.  Skin:Warm & dry.  Intact without suspicious lesions or rashes ; no tenting  Lymphatic: No lymphadenopathy is noted about the head, neck, axilla  Neuro: Strength, tone & DTRs normal.      Assessment & Plan:  See Current Assessment & Plan in Problem List under specific Diagnosis

## 2015-09-07 NOTE — Patient Instructions (Signed)
  Your next office appointment will be determined based upon review of your pending labs. Those written interpretation of the lab results and instructions will be transmitted to you by mail for your records.  Critical results will be called.   Followup as needed for any active or acute issue. Please report any significant change in your symptoms. 

## 2015-09-07 NOTE — Progress Notes (Signed)
Pre visit review using our clinic review tool, if applicable. No additional management support is needed unless otherwise documented below in the visit note. 

## 2015-09-07 NOTE — Assessment & Plan Note (Signed)
As per Dr. Alinda Money. Follow-up appointment in November

## 2015-09-07 NOTE — Assessment & Plan Note (Addendum)
A1c

## 2015-09-07 NOTE — Assessment & Plan Note (Signed)
BMET 

## 2015-09-08 NOTE — Assessment & Plan Note (Signed)
No change; check lipids 04/17

## 2015-10-07 DIAGNOSIS — C61 Malignant neoplasm of prostate: Secondary | ICD-10-CM | POA: Diagnosis not present

## 2015-10-14 DIAGNOSIS — C61 Malignant neoplasm of prostate: Secondary | ICD-10-CM | POA: Diagnosis not present

## 2015-10-14 DIAGNOSIS — N5201 Erectile dysfunction due to arterial insufficiency: Secondary | ICD-10-CM | POA: Diagnosis not present

## 2015-12-08 ENCOUNTER — Ambulatory Visit (INDEPENDENT_AMBULATORY_CARE_PROVIDER_SITE_OTHER)
Admission: RE | Admit: 2015-12-08 | Discharge: 2015-12-08 | Disposition: A | Discharge status: Home / Self Care | Payer: Medicare Other | Source: Ambulatory Visit | Attending: Internal Medicine | Admitting: Internal Medicine

## 2015-12-08 ENCOUNTER — Encounter: Payer: Self-pay | Admitting: Internal Medicine

## 2015-12-08 ENCOUNTER — Ambulatory Visit (INDEPENDENT_AMBULATORY_CARE_PROVIDER_SITE_OTHER): Payer: Medicare Other | Admitting: Internal Medicine

## 2015-12-08 VITALS — BP 126/72 | HR 93 | Temp 98.0°F | Resp 20 | Ht 74.0 in | Wt 223.2 lb

## 2015-12-08 DIAGNOSIS — I10 Essential (primary) hypertension: Secondary | ICD-10-CM | POA: Diagnosis not present

## 2015-12-08 DIAGNOSIS — E119 Type 2 diabetes mellitus without complications: Secondary | ICD-10-CM | POA: Diagnosis not present

## 2015-12-08 DIAGNOSIS — J209 Acute bronchitis, unspecified: Secondary | ICD-10-CM

## 2015-12-08 DIAGNOSIS — R05 Cough: Secondary | ICD-10-CM | POA: Diagnosis not present

## 2015-12-08 MED ORDER — PROMETHAZINE-CODEINE 6.25-10 MG/5ML PO SYRP: 5.0000 mL | ORAL_SOLUTION | ORAL | Status: DC | PRN

## 2015-12-08 MED ORDER — AZITHROMYCIN 250 MG PO TABS: ORAL_TABLET | ORAL | Status: DC

## 2015-12-08 NOTE — Assessment & Plan Note (Signed)
On Losartan 

## 2015-12-08 NOTE — Assessment & Plan Note (Signed)
CBGs are ok per pt - on diet

## 2015-12-08 NOTE — Assessment & Plan Note (Signed)
CXR Prom-cod syr Zpac 

## 2015-12-08 NOTE — Progress Notes (Signed)
Subjective:  Patient ID: Alfred Foster, male    DOB: April 01, 1946  Age: 69 y.o. MRN: [id number]  CC: No chief complaint on file.   HPI Alfred Foster presents for URI sx's x 1 week: cough, congestion  Outpatient Prescriptions Prior to Visit  Medication Sig Dispense Refill  . Ascorbic Acid (VITAMIN C PO) Take by mouth daily.      Marland Kitchen aspirin 81 MG tablet Take 81 mg by mouth daily.    . Cholecalciferol (VITAMIN D3) 1000 UNITS CAPS Take 1,000 Units by mouth daily.    . COD LIVER OIL PO Take by mouth daily.      . folic acid (FOLVITE) Q000111Q MCG tablet Take 800 mcg by mouth daily.     . Lancets (ONETOUCH ULTRASOFT) lancets use as directed 100 each 1  . losartan (COZAAR) 100 MG tablet take 1 tablet by mouth once daily 90 tablet 3  . niacin 500 MG tablet Take 500 mg by mouth once.    . Omega-3 Fatty Acids (FISH OIL PO) Take 1,000 mg by mouth daily.     . ONE TOUCH ULTRA TEST test strip use as directed 100 each 1  . pravastatin (PRAVACHOL) 40 MG tablet take 1/2 tablet by mouth at bedtime 45 tablet 1   No facility-administered medications prior to visit.    ROS Review of Systems  Constitutional: Positive for chills and fatigue. Negative for fever, appetite change and unexpected weight change.  HENT: Positive for congestion. Negative for nosebleeds, sneezing, sore throat and trouble swallowing.   Eyes: Negative for itching and visual disturbance.  Respiratory: Positive for cough and shortness of breath.   Cardiovascular: Negative for chest pain, palpitations and leg swelling.  Gastrointestinal: Negative for nausea, diarrhea, blood in stool and abdominal distention.  Genitourinary: Negative for frequency and hematuria.  Musculoskeletal: Negative for back pain, joint swelling, gait problem and neck pain.  Skin: Negative for rash.  Neurological: Negative for dizziness, tremors, speech difficulty and weakness.  Psychiatric/Behavioral: Negative for suicidal ideas, sleep disturbance, dysphoric  mood and agitation. The patient is not nervous/anxious.     Objective:  BP 126/72 mmHg  Pulse 93  Temp(Src) 98 F (36.7 C) (Oral)  Resp 20  Ht 6\' 2"  (1.88 m)  Wt 223 lb 4 oz (101.266 kg)  BMI 28.65 kg/m2  SpO2 98%  BP Readings from Last 3 Encounters:  12/08/15 126/72  09/07/15 150/90  03/19/15 138/80    Wt Readings from Last 3 Encounters:  12/08/15 223 lb 4 oz (101.266 kg)  09/07/15 225 lb (102.059 kg)  03/19/15 233 lb 4 oz (105.802 kg)    Physical Exam  Constitutional: He is oriented to person, place, and time. He appears well-developed. No distress.  NAD  HENT:  Mouth/Throat: Oropharynx is clear and moist.  Eyes: Conjunctivae are normal. Pupils are equal, round, and reactive to light.  Neck: Normal range of motion. No JVD present. No thyromegaly present.  Cardiovascular: Normal rate, regular rhythm, normal heart sounds and intact distal pulses.  Exam reveals no gallop and no friction rub.   No murmur heard. Pulmonary/Chest: Effort normal and breath sounds normal. No respiratory distress. He has no wheezes. He has no rales. He exhibits no tenderness.  Abdominal: Soft. Bowel sounds are normal. He exhibits no distension and no mass. There is no tenderness. There is no rebound and no guarding.  Musculoskeletal: Normal range of motion. He exhibits no edema or tenderness.  Lymphadenopathy:    He has no cervical  adenopathy.  Neurological: He is alert and oriented to person, place, and time. He has normal reflexes. No cranial nerve deficit. He exhibits normal muscle tone. He displays a negative Romberg sign. Coordination and gait normal.  Skin: Skin is warm and dry. No rash noted.  Psychiatric: He has a normal mood and affect. His behavior is normal. Judgment and thought content normal.    Lab Results  Component Value Date   WBC 8.1 12/14/2012   HGB 15.2 12/14/2012   HCT 43.7 12/14/2012   PLT 262 12/14/2012   GLUCOSE 111* 09/07/2015   CHOL 144 03/19/2015   TRIG 55.0  03/19/2015   HDL 33.60* 03/19/2015   LDLCALC 99 03/19/2015   ALT 22 03/19/2015   AST 21 03/19/2015   NA 142 09/07/2015   K 4.1 09/07/2015   CL 110 09/07/2015   CREATININE 1.17 09/07/2015   BUN 13 09/07/2015   CO2 24 09/07/2015   TSH 1.19 03/19/2015   INR 1.01 12/14/2012   HGBA1C 6.6* 09/07/2015   MICROALBUR 1.0 03/19/2015    No results found.  Assessment & Plan:   There are no diagnoses linked to this encounter. I am having Mr. Coniglio maintain his COD LIVER OIL PO, Omega-3 Fatty Acids (FISH OIL PO), folic acid, Ascorbic Acid (VITAMIN C PO), niacin, aspirin, Vitamin D3, onetouch ultrasoft, ONE TOUCH ULTRA TEST, losartan, and pravastatin.  No orders of the defined types were placed in this encounter.     Follow-up: No Follow-up on file.  Walker Kehr, MD

## 2015-12-08 NOTE — Progress Notes (Signed)
Pre visit review using our clinic review tool, if applicable. No additional management support is needed unless otherwise documented below in the visit note. 

## 2016-02-17 ENCOUNTER — Encounter: Payer: Self-pay | Admitting: Internal Medicine

## 2016-02-17 ENCOUNTER — Other Ambulatory Visit (INDEPENDENT_AMBULATORY_CARE_PROVIDER_SITE_OTHER): Payer: Medicare Other

## 2016-02-17 ENCOUNTER — Ambulatory Visit (INDEPENDENT_AMBULATORY_CARE_PROVIDER_SITE_OTHER): Payer: Medicare Other | Admitting: Internal Medicine

## 2016-02-17 VITALS — BP 180/96 | HR 87 | Temp 98.6°F | Resp 16 | Wt 228.0 lb

## 2016-02-17 DIAGNOSIS — M25512 Pain in left shoulder: Secondary | ICD-10-CM

## 2016-02-17 DIAGNOSIS — E119 Type 2 diabetes mellitus without complications: Secondary | ICD-10-CM

## 2016-02-17 DIAGNOSIS — E785 Hyperlipidemia, unspecified: Secondary | ICD-10-CM

## 2016-02-17 DIAGNOSIS — Z139 Encounter for screening, unspecified: Secondary | ICD-10-CM

## 2016-02-17 DIAGNOSIS — I10 Essential (primary) hypertension: Secondary | ICD-10-CM

## 2016-02-17 LAB — COMPREHENSIVE METABOLIC PANEL
ALK PHOS: 87 U/L (ref 39–117)
ALT: 24 U/L (ref 0–53)
AST: 22 U/L (ref 0–37)
Albumin: 4.3 g/dL (ref 3.5–5.2)
BILIRUBIN TOTAL: 0.5 mg/dL (ref 0.2–1.2)
BUN: 19 mg/dL (ref 6–23)
CO2: 25 mEq/L (ref 19–32)
CREATININE: 1.36 mg/dL (ref 0.40–1.50)
Calcium: 9.6 mg/dL (ref 8.4–10.5)
Chloride: 109 mEq/L (ref 96–112)
GFR: 66.9 mL/min (ref >60.00)
GLUCOSE: 98 mg/dL (ref 70–99)
POTASSIUM: 3.9 meq/L (ref 3.5–5.1)
Sodium: 141 mEq/L (ref 135–145)
Total Protein: 7.3 g/dL (ref 6.0–8.3)

## 2016-02-17 LAB — HEMOGLOBIN A1C: HEMOGLOBIN A1C: 6.7 % — AB (ref 4.6–6.5)

## 2016-02-17 MED ORDER — PRAVASTATIN SODIUM 40 MG PO TABS: ORAL_TABLET | ORAL | Status: DC

## 2016-02-17 MED ORDER — DICLOFENAC SODIUM 1.5 % TD SOLN: TRANSDERMAL | Status: DC

## 2016-02-17 MED ORDER — LOSARTAN POTASSIUM 100 MG PO TABS: ORAL_TABLET | ORAL | Status: DC

## 2016-02-17 MED ORDER — AMLODIPINE BESYLATE 5 MG PO TABS: 5.0000 mg | ORAL_TABLET | Freq: Every day | ORAL | Status: DC

## 2016-02-17 NOTE — Progress Notes (Signed)
Pre visit review using our clinic review tool, if applicable. No additional management support is needed unless otherwise documented below in the visit note. 

## 2016-02-17 NOTE — Assessment & Plan Note (Signed)
Possible bursitis Continue heat Trial of topical diclofenac - samples given Can have him see dr Tamala Julian if no improvement

## 2016-02-17 NOTE — Assessment & Plan Note (Signed)
Lab Results  Component Value Date   HGBA1C 6.7* 02/17/2016   Controlled Not compliant with a diabetic diet, active but no regular exercise Not on any medication Check a1c

## 2016-02-17 NOTE — Progress Notes (Signed)
Subjective:    Patient ID: Alfred Foster, male    DOB: [birth year], 70 y.o.   MRN: [id number]  HPI He is here to establish with a new pcp.    Diabetes: He is taking his medication daily as prescribed. He is not  compliant with a diabetic diet. He is not exercising regularly. He monitors his sugars and they have been running 95-102 in morning, 122-145 after meals. He checks his feet daily and denies foot lesions. He is up-to-date with an ophthalmology examination.   Hypertension: He is taking his medication daily. He is not compliant with a low sodium diet.  He denies chest pain, palpitations, edema, shortness of breath and regular headaches. He is not exercising regularly.  He does not monitor his blood pressure at home.    Hyperlipidemia: He is taking his medication daily. He is not compliant with a low fat/cholesterol diet. He is not exercising regularly, but is very active at work . He denies myalgias.   Left shoulder pain:  He typically feels it when he sleeps at night.  No arm or neck pain.  He uses aspercreme and it helps.  It started a couple of months ago.  Sometimes it is hard to raise his arm up at times.  He denies any injury.   Medications and allergies reviewed with patient and updated if appropriate.  Patient Active Problem List   Diagnosis Date Noted  . Acute bronchitis 12/08/2015  . Lipoma of skin of abdomen 03/19/2015  . Nonspecific abnormal electrocardiogram (ECG) (EKG) 03/15/2012  . Essential hypertension 10/22/2010  . PROSTATE CANCER, HX OF 01/23/2009  . Diabetes type 2, controlled ([place]) 08/09/2007  . Hyperlipidemia 05/08/2007    Current Outpatient Prescriptions on File Prior to Visit  Medication Sig Dispense Refill  . Ascorbic Acid (VITAMIN C PO) Take by mouth daily.      Marland Kitchen aspirin 81 MG tablet Take 81 mg by mouth daily.    . Cholecalciferol (VITAMIN D3) 1000 UNITS CAPS Take 1,000 Units by mouth daily.    . COD LIVER OIL PO Take by mouth daily.      . folic  acid (FOLVITE) Q000111Q MCG tablet Take 800 mcg by mouth daily.     . Lancets (ONETOUCH ULTRASOFT) lancets use as directed 100 each 1  . losartan (COZAAR) 100 MG tablet take 1 tablet by mouth once daily 90 tablet 3  . niacin 500 MG tablet Take 500 mg by mouth once.    . Omega-3 Fatty Acids (FISH OIL PO) Take 1,000 mg by mouth daily.     . ONE TOUCH ULTRA TEST test strip use as directed 100 each 1  . pravastatin (PRAVACHOL) 40 MG tablet take 1/2 tablet by mouth at bedtime 45 tablet 1   No current facility-administered medications on file prior to visit.    Past Medical History  Diagnosis Date  . Diabetes mellitus   . Hyperlipidemia   . Prostate cancer [facility]) 2007    Dr Alinda Money  . Hypertension     Past Surgical History  Procedure Laterality Date  . Inguinal hernia repair    . Prostatectomy  04/2006    Dr Alinda Money  . Colonoscopy  2004 & 2014    Negative ;[facility]  . Umbilical hernia repair      Social History   Social History  . Marital Status: Married    Spouse Name: N/A  . Number of Children: N/A  . Years of Education: N/A  Social History Main Topics  . Smoking status: Never Smoker   . Smokeless tobacco: Never Used  . Alcohol Use: No  . Drug Use: No  . Sexual Activity: Not Asked   Other Topics Concern  . None   Social History Narrative    Family History  Problem Relation Age of Onset  . Diabetes Father   . Hypertension Father   . Alzheimer's disease Father   . Hypertension Mother   . Alzheimer's disease Mother   . Heart attack Mother 102  . Diabetes Brother   . Heart disease Brother   . Heart attack Maternal Aunt     in 4s  . Stroke Neg Hx     Review of Systems  Constitutional: Negative for fever and chills.  Respiratory: Positive for shortness of breath (occaisonal - bending over, not with walking). Negative for cough and wheezing.   Cardiovascular: Negative for chest pain, palpitations and leg swelling.  Gastrointestinal: Negative for nausea and  abdominal pain.       No GERD  Musculoskeletal: Positive for arthralgias (left shoulder). Negative for myalgias.  Neurological: Positive for light-headedness (occaisonal). Negative for dizziness, numbness and headaches.       Objective:   Filed Vitals:   02/17/16 1325  BP: 180/96  Pulse: 87  Temp: 98.6 F (37 C)  Resp: 16   Filed Weights   02/17/16 1325  Weight: 228 lb (103.42 kg)   Body mass index is 29.26 kg/(m^2).   Physical Exam Constitutional: Appears well-developed and well-nourished. No distress.  Neck: Neck supple. No tracheal deviation present. No thyromegaly present.  No carotid bruit. No cervical adenopathy.   Cardiovascular: Normal rate, regular rhythm and normal heart sounds.   No murmur heard.  No edema Pulmonary/Chest: Effort normal and breath sounds normal. No respiratory distress. No wheezes.  Msk: no pain with palpation of left shoulder, FROM, no defect      Assessment & Plan:   See Problem List for Assessment and Plan of chronic medical problems.  Follow up in 6 months

## 2016-02-17 NOTE — Assessment & Plan Note (Signed)
Not controlled Stressed low sodium diet Start monitoring BP at home Check cmp today Continue losartan 100 mg daily Start amlodipine 5 mg daily

## 2016-02-17 NOTE — Assessment & Plan Note (Signed)
On pravachol Did not fast and would like to get blood work done today Will hold off on checking lipid panel until next blood work [place]

## 2016-02-17 NOTE — Patient Instructions (Addendum)
   Test(s) ordered today. Your results will be released to [place] (or called to you) after review, usually within 72hours after test completion. If any changes need to be made, you will be notified at that same time.  All other Health Maintenance issues reviewed.   All recommended immunizations and age-appropriate screenings are up-to-date.  No immunizations administered today.   Medications reviewed and updated.  Changes include starting amlodipine for your blood pressure.  Continue your other medications.   Try the topical anti-inflammatory for your shoulder - if no improvement call us.   Your prescription(s) have been submitted to your pharmacy. Please take as directed and contact our office if you believe you are having problem(s) with the medication(s).   Please followup in 6 months

## 2016-02-18 LAB — HEPATITIS C ANTIBODY: HCV AB: NEGATIVE

## 2016-02-19 ENCOUNTER — Telehealth: Payer: Self-pay | Admitting: Internal Medicine

## 2016-02-19 NOTE — Telephone Encounter (Signed)
error 

## 2016-03-10 LAB — HM DIABETES EYE EXAM

## 2016-03-11 ENCOUNTER — Encounter: Payer: Self-pay | Admitting: Internal Medicine

## 2016-08-18 ENCOUNTER — Ambulatory Visit (INDEPENDENT_AMBULATORY_CARE_PROVIDER_SITE_OTHER): Payer: Medicare Other

## 2016-08-18 DIAGNOSIS — Z23 Encounter for immunization: Secondary | ICD-10-CM

## 2016-09-07 ENCOUNTER — Encounter: Payer: Self-pay | Admitting: Internal Medicine

## 2016-09-13 ENCOUNTER — Ambulatory Visit (INDEPENDENT_AMBULATORY_CARE_PROVIDER_SITE_OTHER): Payer: Medicare Other | Admitting: Internal Medicine

## 2016-09-13 ENCOUNTER — Other Ambulatory Visit (INDEPENDENT_AMBULATORY_CARE_PROVIDER_SITE_OTHER): Payer: Medicare Other

## 2016-09-13 ENCOUNTER — Encounter: Payer: Self-pay | Admitting: Internal Medicine

## 2016-09-13 VITALS — BP 144/92 | HR 90 | Temp 98.2°F | Resp 16 | Wt 232.0 lb

## 2016-09-13 DIAGNOSIS — E119 Type 2 diabetes mellitus without complications: Secondary | ICD-10-CM

## 2016-09-13 DIAGNOSIS — E7800 Pure hypercholesterolemia, unspecified: Secondary | ICD-10-CM

## 2016-09-13 DIAGNOSIS — I10 Essential (primary) hypertension: Secondary | ICD-10-CM

## 2016-09-13 DIAGNOSIS — Z8546 Personal history of malignant neoplasm of prostate: Secondary | ICD-10-CM | POA: Diagnosis not present

## 2016-09-13 DIAGNOSIS — Z0000 Encounter for general adult medical examination without abnormal findings: Secondary | ICD-10-CM

## 2016-09-13 LAB — LIPID PANEL
CHOL/HDL RATIO: 4
Cholesterol: 156 mg/dL (ref 0–200)
HDL: 39.3 mg/dL (ref >39.00)
LDL CALC: 104 mg/dL — AB (ref 0–99)
NonHDL: 117
TRIGLYCERIDES: 67 mg/dL (ref 0.0–149.0)
VLDL: 13.4 mg/dL (ref 0.0–40.0)

## 2016-09-13 LAB — COMPREHENSIVE METABOLIC PANEL
ALT: 28 U/L (ref 0–53)
AST: 24 U/L (ref 0–37)
Albumin: 4.1 g/dL (ref 3.5–5.2)
Alkaline Phosphatase: 86 U/L (ref 39–117)
BILIRUBIN TOTAL: 0.5 mg/dL (ref 0.2–1.2)
BUN: 20 mg/dL (ref 6–23)
CALCIUM: 9.5 mg/dL (ref 8.4–10.5)
CHLORIDE: 108 meq/L (ref 96–112)
CO2: 28 meq/L (ref 19–32)
CREATININE: 1.49 mg/dL (ref 0.40–1.50)
GFR: 60.11 mL/min (ref >60.00)
Glucose, Bld: 95 mg/dL (ref 70–99)
Potassium: 4.4 mEq/L (ref 3.5–5.1)
SODIUM: 142 meq/L (ref 135–145)
Total Protein: 7.6 g/dL (ref 6.0–8.3)

## 2016-09-13 LAB — CBC WITH DIFFERENTIAL/PLATELET
BASOS ABS: 0 10*3/uL (ref 0.0–0.1)
Basophils Relative: 0.4 % (ref 0.0–3.0)
EOS ABS: 0.2 10*3/uL (ref 0.0–0.7)
Eosinophils Relative: 2.2 % (ref 0.0–5.0)
HEMATOCRIT: 42.2 % (ref 39.0–52.0)
HEMOGLOBIN: 14.6 g/dL (ref 13.0–17.0)
LYMPHS PCT: 35.8 % (ref 12.0–46.0)
Lymphs Abs: 2.9 10*3/uL (ref 0.7–4.0)
MCHC: 34.5 g/dL (ref 30.0–36.0)
MCV: 91.7 fl (ref 78.0–100.0)
MONO ABS: 0.8 10*3/uL (ref 0.1–1.0)
Monocytes Relative: 9.5 % (ref 3.0–12.0)
NEUTROS ABS: 4.2 10*3/uL (ref 1.4–7.7)
Neutrophils Relative %: 52.1 % (ref 43.0–77.0)
PLATELETS: 275 10*3/uL (ref 150.0–400.0)
RBC: 4.6 Mil/uL (ref 4.22–5.81)
RDW: 13.9 % (ref 11.5–15.5)
WBC: 8.1 10*3/uL (ref 4.0–10.5)

## 2016-09-13 LAB — MICROALBUMIN / CREATININE URINE RATIO
Creatinine,U: 199.3 mg/dL
MICROALB UR: 1.1 mg/dL (ref 0.0–1.9)
Microalb Creat Ratio: 0.6 mg/g (ref 0.0–30.0)

## 2016-09-13 LAB — HEMOGLOBIN A1C: Hgb A1c MFr Bld: 6.7 % — ABNORMAL HIGH (ref 4.6–6.5)

## 2016-09-13 LAB — TSH: TSH: 1.14 u[IU]/mL (ref 0.35–4.50)

## 2016-09-13 MED ORDER — AMLODIPINE BESYLATE 10 MG PO TABS: 10.0000 mg | ORAL_TABLET | Freq: Every day | ORAL | 3 refills | Status: DC

## 2016-09-13 NOTE — Assessment & Plan Note (Signed)
Sees Dr Alinda Money annually No evidence of recurrence

## 2016-09-13 NOTE — Assessment & Plan Note (Addendum)
Sugars in AM 98-115, 125-145 in evening Encouraged diabetic diet, exercise, weight loss Check a1c, urine micro

## 2016-09-13 NOTE — Progress Notes (Signed)
Pre visit review using our clinic review tool, if applicable. No additional management support is needed unless otherwise documented below in the visit note. 

## 2016-09-13 NOTE — Progress Notes (Signed)
Subjective:    Patient ID: Alfred Foster, male    DOB: 12-10-1946, 70 y.o.   MRN: [id number]  HPI Here for medicare wellness exam and an annual physical exam.   I have personally reviewed and have noted 1.The patient's medical and social history 2.Their use of alcohol, tobacco or illicit drugs 3.Their current medications and supplements 4.The patient's functional ability including ADL's, fall risks, home safety risks and hearing or visual impairment. 5.Diet and physical activities 6.Evidence for depression or mood disorders 7.Care team reviewed   - urology - Dr Alinda Money, Eye - Dr Prudencio Burly   Are there smokers in your home (other than you)? No  Risk Factors Exercise: weights, stretches, yard work Dietary issues discussed: trying to cut back on burgers/fries, sweets; drinks lots of G2 gatarade/juice, ice cream.  Eats veges.    Cardiac risk factors: advanced age, hypertension, hyperlipidemia  Depression Screen  Have you felt down, depressed or hopeless? No  Have you felt little interest or pleasure in doing things?  No  Activities of Daily Living In your present state of health, do you have any difficulty performing the following activities?:  Driving? No Managing money?  No Feeding yourself? No Getting from bed to chair? No Climbing a flight of stairs? No - but has knee pain Preparing food and eating?: No Bathing or showering? No Getting dressed: No Getting to/using the toilet? No Moving around from place to place: No In the past year have you fallen or had a near fall?: No   Are you sexually active?  No  Do you have more than one partner?  N/A  Hearing Difficulties: No Do you often ask people to speak up or repeat themselves? No Do you experience ringing or noises in your ears? No Do you have difficulty understanding soft or whispered voices? No Vision:              Any change in vision: no   Up to date with eye exam:  yes Memory:  Do you feel that you have a problem with memory? No - does have some recall problems  Do you often misplace items? Yes, keys or phone  Do you feel safe at home?  Yes  Cognitive Testing  Alert, Orientated? Yes  Normal Appearance? Yes  Recall of three objects?  Yes  Can perform simple calculations? Yes  Displays appropriate judgment? Yes  Can read the correct time from a watch face? Yes   Advanced Directives have been discussed with the patient? Yes   Medications and allergies reviewed with patient and updated if appropriate.  Patient Active Problem List   Diagnosis Date Noted  . Left shoulder pain 02/17/2016  . Lipoma of skin of abdomen 03/19/2015  . Nonspecific abnormal electrocardiogram (ECG) (EKG) 03/15/2012  . Essential hypertension 10/22/2010  . PROSTATE CANCER, HX OF 01/23/2009  . Diabetes type 2, controlled ([place]) 08/09/2007  . Hyperlipidemia 05/08/2007    Current Outpatient Prescriptions on File Prior to Visit  Medication Sig Dispense Refill  . amLODipine (NORVASC) 5 MG tablet Take 1 tablet (5 mg total) by mouth daily. 90 tablet 3  . Ascorbic Acid (VITAMIN C PO) Take by mouth daily.      Marland Kitchen aspirin 81 MG tablet Take 81 mg by mouth daily.    . Cholecalciferol (VITAMIN D3) 1000 UNITS CAPS Take 1,000 Units by mouth daily.    . COD LIVER OIL PO Take by mouth daily.      . Diclofenac Sodium  1.5 % SOLN Use bid 1 Bottle 0  . folic acid (FOLVITE) Q000111Q MCG tablet Take 800 mcg by mouth daily.     . Lancets (ONETOUCH ULTRASOFT) lancets use as directed 100 each 1  . losartan (COZAAR) 100 MG tablet take 1 tablet by mouth once daily 90 tablet 3  . niacin 500 MG tablet Take 500 mg by mouth once.    . Omega-3 Fatty Acids (FISH OIL PO) Take 1,000 mg by mouth daily.     . ONE TOUCH ULTRA TEST test strip use as directed 100 each 1  . pravastatin (PRAVACHOL) 40 MG tablet take 1/2 tablet by mouth at bedtime 45 tablet 3   No current  facility-administered medications on file prior to visit.     Past Medical History:  Diagnosis Date  . Diabetes mellitus   . Hyperlipidemia   . Hypertension   . Prostate cancer [facility]) 2007   Dr Alinda Money    Past Surgical History:  Procedure Laterality Date  . COLONOSCOPY  2004 & 2014   Negative ;[facility]  . INGUINAL HERNIA REPAIR    . PROSTATECTOMY  04/2006   Dr Alinda Money  . UMBILICAL HERNIA REPAIR      Social History   Social History  . Marital status: Married    Spouse name: N/A  . Number of children: N/A  . Years of education: N/A   Social History Main Topics  . Smoking status: Never Smoker  . Smokeless tobacco: Never Used  . Alcohol use No  . Drug use: No  . Sexual activity: Not on file   Other Topics Concern  . Not on file   Social History Narrative  . No narrative on file    Family History  Problem Relation Age of Onset  . Diabetes Father   . Hypertension Father   . Alzheimer's disease Father   . Hypertension Mother   . Alzheimer's disease Mother   . Heart attack Mother 75  . Diabetes Brother   . Heart disease Brother   . Heart attack Maternal Aunt     in 62s  . Stroke Neg Hx     Review of Systems  Constitutional: Negative for appetite change, chills, fatigue and fever.  HENT: Negative for hearing loss and tinnitus.   Eyes: Negative for visual disturbance.  Respiratory: Negative for cough, shortness of breath and wheezing.   Cardiovascular: Negative for chest pain, palpitations and leg swelling.  Gastrointestinal: Negative for abdominal pain, blood in stool, constipation, diarrhea and nausea.       No gerd  Genitourinary: Negative for dysuria and hematuria.  Musculoskeletal: Negative for arthralgias, back pain and myalgias.  Skin: Negative for color change and rash.  Neurological: Negative for dizziness, light-headedness, numbness and headaches.  Psychiatric/Behavioral: Negative for dysphoric mood. The patient is not nervous/anxious.          Objective:   Vitals:   09/13/16 1407  BP: (!) 144/92  Pulse: 90  Resp: 16  Temp: 98.2 F (36.8 C)   Filed Weights   09/13/16 1407  Weight: 232 lb (105.2 kg)   Body mass index is 29.79 kg/m.   Physical Exam Constitutional: He appears well-developed and well-nourished. No distress.  HENT:  Head: Normocephalic and atraumatic.  Right Ear: External ear normal.  Left Ear: External ear normal.  Mouth/Throat: Oropharynx is clear and moist.  Normal ear canals and TM b/l  Eyes: Conjunctivae and EOM are normal.  Neck: Neck supple. No tracheal deviation present. No  thyromegaly present.  No carotid bruit  Cardiovascular: Normal rate, regular rhythm, normal heart sounds and intact distal pulses.   No murmur heard. Pulmonary/Chest: Effort normal and breath sounds normal. No respiratory distress. He has no wheezes. He has no rales.  Abdominal: Soft. Bowel sounds are normal. He exhibits no distension. There is no tenderness.  Genitourinary: deferred  Musculoskeletal: He exhibits no edema.  Lymphadenopathy:   He has no cervical adenopathy.  Skin: Skin is warm and dry. He is not diaphoretic.  Psychiatric: He has a normal mood and affect. His behavior is normal.         Assessment & Plan:   Wellness Exam: Immunizations  Up to date  Colonoscopy   Up to date  Eye exam  Up to date  Hearing loss  none Memory concerns/difficulties   none Independent of ADLs  fully Stressed the importance of regular exercise   Patient received copy of preventative screening tests/immunizations recommended for the next 5-10 years.   Physical exam: Screening blood work  ordered Immunizations  Up to date  Colonoscopy   Up to date  Eye exams   Up to date EKG - normal in 2013 Exercise  Active, some exercise, advised increase in exercise Weight  Advised weight loss Skin   - none Substance abuse none  See Problem List for Assessment and Plan of chronic medical problems.

## 2016-09-13 NOTE — Assessment & Plan Note (Signed)
BP not well controlled Increase amlodipine to 10 mg daily Continue losartan 100 mg daily Start checking BP at home

## 2016-09-13 NOTE — Assessment & Plan Note (Addendum)
Continue statin. Check lipids

## 2016-09-13 NOTE — Patient Instructions (Addendum)
Alfred Foster , Thank you for taking time to come for your Medicare Wellness Visit. I appreciate your ongoing commitment to your health goals. Please review the following plan we discussed and let me know if I can assist you in the future.   These are the goals we discussed: Goals    Increase your exercise, work on weight loss      This is a list of the screening recommended for you and due dates:  Health Maintenance  Topic Date Due  . Hemoglobin A1C  08/19/2016  . Eye exam for diabetics  03/10/2017  . Complete foot exam   09/13/2017  . Tetanus Vaccine  04/21/2020  . Colon Cancer Screening  06/25/2023  . Flu Shot  Completed  . Shingles Vaccine  Completed  .  Hepatitis C: One time screening is recommended by Center for Disease Control  (CDC) for  adults born from [birth year] through 1964.   Completed  . Pneumonia vaccines  Completed     Test(s) ordered today. Your results will be released to [place] (or called to you) after review, usually within 72hours after test completion. If any changes need to be made, you will be notified at that same time.  All other Health Maintenance issues reviewed.   All recommended immunizations and age-appropriate screenings are up-to-date or discussed.  No immunizations administered today.   Medications reviewed and updated.  Changes include increasing your amlodipine to 10 mg daily.   A referral was ordered for podiatry  Please followup in 6 months  Health Maintenance, Male A healthy lifestyle and preventative care can promote health and wellness.  Maintain regular health, dental, and eye exams.  Eat a healthy diet. Foods like vegetables, fruits, whole grains, low-fat dairy products, and lean protein foods contain the nutrients you need and are low in calories. Decrease your intake of foods high in solid fats, added sugars, and salt. Get information about a proper diet from your health care provider, if necessary.  Regular physical exercise is one of  the most important things you can do for your health. Most adults should get at least 150 minutes of moderate-intensity exercise (any activity that increases your heart rate and causes you to sweat) each week. In addition, most adults need muscle-strengthening exercises on 2 or more days a week.   Maintain a healthy weight. The body mass index (BMI) is a screening tool to identify possible weight problems. It provides an estimate of body fat based on height and weight. Your health care provider can find your BMI and can help you achieve or maintain a healthy weight. For males 20 years and older:  A BMI below 18.5 is considered underweight.  A BMI of 18.5 to 24.9 is normal.  A BMI of 25 to 29.9 is considered overweight.  A BMI of 30 and above is considered obese.  Maintain normal blood lipids and cholesterol by exercising and minimizing your intake of saturated fat. Eat a balanced diet with plenty of fruits and vegetables. Blood tests for lipids and cholesterol should begin at age 68 and be repeated every 5 years. If your lipid or cholesterol levels are high, you are over age 58, or you are at high risk for heart disease, you may need your cholesterol levels checked more frequently.Ongoing high lipid and cholesterol levels should be treated with medicines if diet and exercise are not working.  If you smoke, find out from your health care provider how to quit. If you do not use  tobacco, do not start.  Lung cancer screening is recommended for adults aged 61-80 years who are at high risk for developing lung cancer because of a history of smoking. A yearly low-dose CT scan of the lungs is recommended for people who have at least a 30-pack-year history of smoking and are current smokers or have quit within the past 15 years. A pack year of smoking is smoking an average of 1 pack of cigarettes a day for 1 year (for example, a 30-pack-year history of smoking could mean smoking 1 pack a day for 30 years or  2 packs a day for 15 years). Yearly screening should continue until the smoker has stopped smoking for at least 15 years. Yearly screening should be stopped for people who develop a health problem that would prevent them from having lung cancer treatment.  If you choose to drink alcohol, do not have more than 2 drinks per day. One drink is considered to be 12 oz (360 mL) of beer, 5 oz (150 mL) of wine, or 1.5 oz (45 mL) of liquor.  Avoid the use of street drugs. Do not share needles with anyone. Ask for help if you need support or instructions about stopping the use of drugs.  High blood pressure causes heart disease and increases the risk of stroke. High blood pressure is more likely to develop in:  People who have blood pressure in the end of the normal range (100-139/85-89 mm Hg).  People who are overweight or obese.  People who are African American.  If you are 27-25 years of age, have your blood pressure checked every 3-5 years. If you are 59 years of age or older, have your blood pressure checked every year. You should have your blood pressure measured twice--once when you are at a hospital or clinic, and once when you are not at a hospital or clinic. Record the average of the two measurements. To check your blood pressure when you are not at a hospital or clinic, you can use:  An automated blood pressure machine at a pharmacy.  A home blood pressure monitor.  If you are 65-44 years old, ask your health care provider if you should take aspirin to prevent heart disease.  Diabetes screening involves taking a blood sample to check your fasting blood sugar level. This should be done once every 3 years after age 73 if you are at a normal weight and without risk factors for diabetes. Testing should be considered at a younger age or be carried out more frequently if you are overweight and have at least 1 risk factor for diabetes.  Colorectal cancer can be detected and often prevented. Most  routine colorectal cancer screening begins at the age of 34 and continues through age 26. However, your health care provider may recommend screening at an earlier age if you have risk factors for colon cancer. On a yearly basis, your health care provider may provide home test kits to check for hidden blood in the stool. A small camera at the end of a tube may be used to directly examine the colon (sigmoidoscopy or colonoscopy) to detect the earliest forms of colorectal cancer. Talk to your health care provider about this at age 40 when routine screening begins. A direct exam of the colon should be repeated every 5-10 years through age 22, unless early forms of precancerous polyps or small growths are found.  People who are at an increased risk for hepatitis B should be screened for  this virus. You are considered at high risk for hepatitis B if:  You were born in a country where hepatitis B occurs often. Talk with your health care provider about which countries are considered high risk.  Your parents were born in a high-risk country and you have not received a shot to protect against hepatitis B (hepatitis B vaccine).  You have HIV or AIDS.  You use needles to inject street drugs.  You live with, or have sex with, someone who has hepatitis B.  You are a man who has sex with other men (MSM).  You get hemodialysis treatment.  You take certain medicines for conditions like cancer, organ transplantation, and autoimmune conditions.  Hepatitis C blood testing is recommended for all people born from [birth year] through 1964 and any individual with known risk factors for hepatitis C.  Healthy men should no longer receive prostate-specific antigen (PSA) blood tests as part of routine cancer screening. Talk to your health care provider about prostate cancer screening.  Testicular cancer screening is not recommended for adolescents or adult males who have no symptoms. Screening includes self-exam, a health care  provider exam, and other screening tests. Consult with your health care provider about any symptoms you have or any concerns you have about testicular cancer.  Practice safe sex. Use condoms and avoid high-risk sexual practices to reduce the spread of sexually transmitted infections (STIs).  You should be screened for STIs, including gonorrhea and chlamydia if:  You are sexually active and are younger than 24 years.  You are older than 24 years, and your health care provider tells you that you are at risk for this type of infection.  Your sexual activity has changed since you were last screened, and you are at an increased risk for chlamydia or gonorrhea. Ask your health care provider if you are at risk.  If you are at risk of being infected with HIV, it is recommended that you take a prescription medicine daily to prevent HIV infection. This is called pre-exposure prophylaxis (PrEP). You are considered at risk if:  You are a man who has sex with other men (MSM).  You are a heterosexual man who is sexually active with multiple partners.  You take drugs by injection.  You are sexually active with a partner who has HIV.  Talk with your health care provider about whether you are at high risk of being infected with HIV. If you choose to begin PrEP, you should first be tested for HIV. You should then be tested every 3 months for as long as you are taking PrEP.  Use sunscreen. Apply sunscreen liberally and repeatedly throughout the day. You should seek shade when your shadow is shorter than you. Protect yourself by wearing long sleeves, pants, a wide-brimmed hat, and sunglasses year round whenever you are outdoors.  Tell your health care provider of new moles or changes in moles, especially if there is a change in shape or color. Also, tell your health care provider if a mole is larger than the size of a pencil eraser.  A one-time screening for abdominal aortic aneurysm (AAA) and surgical  repair of large AAAs by ultrasound is recommended for men aged 85-75 years who are current or former smokers.  Stay current with your vaccines (immunizations).   This information is not intended to replace advice given to you by your health care provider. Make sure you discuss any questions you have with your health care provider.   Document Released:  05/26/2008 Document Revised: 12/19/2014 Document Reviewed: 04/25/2011 Elsevier Interactive Patient Education [employer].

## 2016-10-11 ENCOUNTER — Ambulatory Visit (INDEPENDENT_AMBULATORY_CARE_PROVIDER_SITE_OTHER): Payer: Medicare Other | Admitting: Sports Medicine

## 2016-10-11 ENCOUNTER — Encounter: Payer: Self-pay | Admitting: Sports Medicine

## 2016-10-11 DIAGNOSIS — B351 Tinea unguium: Secondary | ICD-10-CM | POA: Diagnosis not present

## 2016-10-11 DIAGNOSIS — E1142 Type 2 diabetes mellitus with diabetic polyneuropathy: Secondary | ICD-10-CM | POA: Diagnosis not present

## 2016-10-11 DIAGNOSIS — L84 Corns and callosities: Secondary | ICD-10-CM | POA: Diagnosis not present

## 2016-10-11 DIAGNOSIS — M21619 Bunion of unspecified foot: Secondary | ICD-10-CM | POA: Diagnosis not present

## 2016-10-11 DIAGNOSIS — M2141 Flat foot [pes planus] (acquired), right foot: Secondary | ICD-10-CM | POA: Diagnosis not present

## 2016-10-11 DIAGNOSIS — M2142 Flat foot [pes planus] (acquired), left foot: Secondary | ICD-10-CM

## 2016-10-11 NOTE — Progress Notes (Signed)
Subjective: Alfred Foster is a 70 y.o. male patient with history of diabetes who presents to office today complaining of long, painful nails  while ambulating in shoes; unable to trim. Patient states that the glucose reading this morning was 115 mg/dl. A1c 6.4. Patient denies any new changes in medication or new problems. Patient denies any new cramping, numbness, burning or tingling in the legs.  Patient Active Problem List   Diagnosis Date Noted  . Left shoulder pain 02/17/2016  . Lipoma of skin of abdomen 03/19/2015  . Nonspecific abnormal electrocardiogram (ECG) (EKG) 03/15/2012  . Essential hypertension 10/22/2010  . PROSTATE CANCER, HX OF 01/23/2009  . Diabetes type 2, controlled ([place]) 08/09/2007  . Hyperlipidemia 05/08/2007   Current Outpatient Prescriptions on File Prior to Visit  Medication Sig Dispense Refill  . amLODipine (NORVASC) 10 MG tablet Take 1 tablet (10 mg total) by mouth daily. 90 tablet 3  . Ascorbic Acid (VITAMIN C PO) Take by mouth daily.      Marland Kitchen aspirin 81 MG tablet Take 81 mg by mouth daily.    . Cholecalciferol (VITAMIN D3) 1000 UNITS CAPS Take 1,000 Units by mouth daily.    . COD LIVER OIL PO Take by mouth daily.      . Diclofenac Sodium 1.5 % SOLN Use bid 1 Bottle 0  . folic acid (FOLVITE) Q000111Q MCG tablet Take 800 mcg by mouth daily.     . Lancets (ONETOUCH ULTRASOFT) lancets use as directed 100 each 1  . losartan (COZAAR) 100 MG tablet take 1 tablet by mouth once daily 90 tablet 3  . niacin 500 MG tablet Take 500 mg by mouth once.    . Omega-3 Fatty Acids (FISH OIL PO) Take 1,000 mg by mouth daily.     . ONE TOUCH ULTRA TEST test strip use as directed 100 each 1  . pravastatin (PRAVACHOL) 40 MG tablet take 1/2 tablet by mouth at bedtime 45 tablet 3   No current facility-administered medications on file prior to visit.    No Known Allergies  Recent Results (from the past 2160 hour(s))  CBC with Differential/Platelet     Status: None   Collection Time:  09/13/16  3:10 PM  Result Value Ref Range   WBC 8.1 4.0 - 10.5 K/uL   RBC 4.60 4.22 - 5.81 Mil/uL   Hemoglobin 14.6 13.0 - 17.0 g/dL   HCT 42.2 39.0 - 52.0 %   MCV 91.7 78.0 - 100.0 fl   MCHC 34.5 30.0 - 36.0 g/dL   RDW 13.9 11.5 - 15.5 %   Platelets 275.0 150.0 - 400.0 K/uL   Neutrophils Relative % 52.1 43.0 - 77.0 %   Lymphocytes Relative 35.8 12.0 - 46.0 %   Monocytes Relative 9.5 3.0 - 12.0 %   Eosinophils Relative 2.2 0.0 - 5.0 %   Basophils Relative 0.4 0.0 - 3.0 %   Neutro Abs 4.2 1.4 - 7.7 K/uL   Lymphs Abs 2.9 0.7 - 4.0 K/uL   Monocytes Absolute 0.8 0.1 - 1.0 K/uL   Eosinophils Absolute 0.2 0.0 - 0.7 K/uL   Basophils Absolute 0.0 0.0 - 0.1 K/uL  Comprehensive metabolic panel     Status: None   Collection Time: 09/13/16  3:10 PM  Result Value Ref Range   Sodium 142 135 - 145 mEq/L   Potassium 4.4 3.5 - 5.1 mEq/L   Chloride 108 96 - 112 mEq/L   CO2 28 19 - 32 mEq/L   Glucose, Bld 95  70 - 99 mg/dL   BUN 20 6 - 23 mg/dL   Creatinine, Ser 1.49 0.40 - 1.50 mg/dL   Total Bilirubin 0.5 0.2 - 1.2 mg/dL   Alkaline Phosphatase 86 39 - 117 U/L   AST 24 0 - 37 U/L   ALT 28 0 - 53 U/L   Total Protein 7.6 6.0 - 8.3 g/dL   Albumin 4.1 3.5 - 5.2 g/dL   Calcium 9.5 8.4 - 10.5 mg/dL   GFR 60.11 >60.00 mL/min  Hemoglobin A1c     Status: Abnormal   Collection Time: 09/13/16  3:10 PM  Result Value Ref Range   Hgb A1c MFr Bld 6.7 (H) 4.6 - 6.5 %    Comment: Glycemic Control Guidelines for People with Diabetes:Non Diabetic:  <6%Goal of Therapy: <7%Additional Action Suggested:  >8%   Lipid panel     Status: Abnormal   Collection Time: 09/13/16  3:10 PM  Result Value Ref Range   Cholesterol 156 0 - 200 mg/dL    Comment: ATP III Classification       Desirable:  < 200 mg/dL               Borderline High:  200 - 239 mg/dL          High:  > = 240 mg/dL   Triglycerides 67.0 0.0 - 149.0 mg/dL    Comment: Normal:  <150 mg/dLBorderline High:  150 - 199 mg/dL   HDL 39.30 >39.00 mg/dL   VLDL  13.4 0.0 - 40.0 mg/dL   LDL Cholesterol 104 (H) 0 - 99 mg/dL   Total CHOL/HDL Ratio 4     Comment:                Men          Women1/2 Average Risk     3.4          3.3Average Risk          5.0          4.42X Average Risk          9.6          7.13X Average Risk          15.0          11.0                       NonHDL 117.00     Comment: NOTE:  Non-HDL goal should be 30 mg/dL higher than patient's LDL goal (i.e. LDL goal of < 70 mg/dL, would have non-HDL goal of < 100 mg/dL)  TSH     Status: None   Collection Time: 09/13/16  3:10 PM  Result Value Ref Range   TSH 1.14 0.35 - 4.50 uIU/mL  Urine Microalbumin w/creat. ratio     Status: None   Collection Time: 09/13/16  3:10 PM  Result Value Ref Range   Microalb, Ur 1.1 0.0 - 1.9 mg/dL   Creatinine,U 199.3 mg/dL   Microalb Creat Ratio 0.6 0.0 - 30.0 mg/g    Objective: General: Patient is awake, alert, and oriented x 3 and in no acute distress.  Integument: Skin is warm, dry and supple bilateral. Nails are tender, long, thickened and  dystrophic with subungual debris, consistent with onychomycosis, 1-5 bilateral. No signs of infection. No open lesions. + callus minimal at medial hallux present bilateral. Remaining integument unremarkable.  Vasculature:  Dorsalis Pedis pulse 2/4 bilateral. Posterior Tibial pulse  1/4  bilateral. Capillary fill time <3 sec 1-5 bilateral. Positive hair growth to the level of the digits. Temperature gradient within normal limits. No varicosities present bilateral. No edema present bilateral.   Neurology: The patient has intact sensation measured with a 5.07/10g Semmes Weinstein Monofilament at all pedal sites bilateral . Vibratory sensation diminished bilateral with tuning fork. No Babinski sign present bilateral.   Musculoskeletal: Asymptomatic bunion and planus foot type pedal deformities noted bilateral. Muscular strength 5/5 in all lower extremity muscular groups bilateral without pain on range of motion . No  tenderness with calf compression bilateral.  Assessment and Plan: Problem List Items Addressed This Visit    None    Visit Diagnoses    Dermatophytosis of nail    -  Primary   Foot callus       Bunion       Pes planus of both feet       Diabetic polyneuropathy associated with type 2 diabetes mellitus ([place])          -Examined patient. -Discussed and educated patient on diabetic foot care, especially with  regards to the vascular, neurological and musculoskeletal systems.  -Stressed the importance of good glycemic control and the detriment of not  controlling glucose levels in relation to the foot. -Mechanically debrided all nails 1-5 bilateral using sterile nail nipper and filed with dremel without incident and smoothed callus areas with bur. -[employer] diabetic shoe order form was completed; office to contact primary care for approval / certification;  Office to arrange shoe fitting and dispensing. -Answered all patient questions -Patient to return  in 3 months for at risk foot care -Patient advised to call the office if any problems or questions arise in the meantime.  Landis Martins, DPM

## 2016-10-11 NOTE — Patient Instructions (Signed)
Diabetes and Foot Care Diabetes may cause you to have problems because of poor blood supply (circulation) to your feet and legs. This may cause the skin on your feet to become thinner, break easier, and heal more slowly. Your skin may become dry, and the skin may peel and crack. You may also have nerve damage in your legs and feet causing decreased feeling in them. You may not notice minor injuries to your feet that could lead to infections or more serious problems. Taking care of your feet is one of the most important things you can do for yourself.  HOME CARE INSTRUCTIONS  Wear shoes at all times, even in the house. Do not go barefoot. Bare feet are easily injured.  Check your feet daily for blisters, cuts, and redness. If you cannot see the bottom of your feet, use a mirror or ask someone for help.  Wash your feet with warm water (do not use hot water) and mild soap. Then pat your feet and the areas between your toes until they are completely dry. Do not soak your feet as this can dry your skin.  Apply a moisturizing lotion or petroleum jelly (that does not contain alcohol and is unscented) to the skin on your feet and to dry, brittle toenails. Do not apply lotion between your toes.  Trim your toenails straight across. Do not dig under them or around the cuticle. File the edges of your nails with an emery board or nail file.  Do not cut corns or calluses or try to remove them with medicine.  Wear clean socks or stockings every day. Make sure they are not too tight. Do not wear knee-high stockings since they may decrease blood flow to your legs.  Wear shoes that fit properly and have enough cushioning. To break in new shoes, wear them for just a few hours a day. This prevents you from injuring your feet. Always look in your shoes before you put them on to be sure there are no objects inside.  Do not cross your legs. This may decrease the blood flow to your feet.  If you find a minor scrape,  cut, or break in the skin on your feet, keep it and the skin around it clean and dry. These areas may be cleansed with mild soap and water. Do not cleanse the area with peroxide, alcohol, or iodine.  When you remove an adhesive bandage, be sure not to damage the skin around it.  If you have a wound, look at it several times a day to make sure it is healing.  Do not use heating pads or hot water bottles. They may burn your skin. If you have lost feeling in your feet or legs, you may not know it is happening until it is too late.  Make sure your health care provider performs a complete foot exam at least annually or more often if you have foot problems. Report any cuts, sores, or bruises to your health care provider immediately. SEEK MEDICAL CARE IF:   You have an injury that is not healing.  You have cuts or breaks in the skin.  You have an ingrown nail.  You notice redness on your legs or feet.  You feel burning or tingling in your legs or feet.  You have pain or cramps in your legs and feet.  Your legs or feet are numb.  Your feet always feel cold. SEEK IMMEDIATE MEDICAL CARE IF:   There is increasing redness,   swelling, or pain in or around a wound.  There is a red line that goes up your leg.  Pus is coming from a wound.  You develop a fever or as directed by your health care provider.  You notice a bad smell coming from an ulcer or wound.   This information is not intended to replace advice given to you by your health care provider. Make sure you discuss any questions you have with your health care provider.   Document Released: 11/25/2000 Document Revised: 07/31/2013 Document Reviewed: 05/07/2013 Elsevier Interactive Patient Education 2016 [employer]  

## 2016-10-25 ENCOUNTER — Telehealth: Payer: Self-pay | Admitting: *Deleted

## 2016-10-25 NOTE — Telephone Encounter (Signed)
Alfred Foster wife called to find out about the paperwork for his diabetic shoes. She said Dr. Quay Burow said it was already sent over.

## 2016-11-23 ENCOUNTER — Telehealth: Payer: Self-pay | Admitting: *Deleted

## 2016-11-23 NOTE — Telephone Encounter (Signed)
Pt. Wife wanted to know if you received the information faxed  from the Primary Physician for his diabetic shoes.

## 2016-12-08 NOTE — Telephone Encounter (Signed)
Please forward message to Anderson Malta to see if she by chance has the paperwork. If not I will fill out another diabetic shoe form for the patient  Dr. Chauncey Cruel

## 2016-12-08 NOTE — Telephone Encounter (Signed)
Dr Cannon Kettle we have no paperwork on file for this patient can you please review and complete.  Thank you

## 2016-12-09 NOTE — Telephone Encounter (Signed)
I want be able to do anything until I get back to the [place] office on Wednesday next week.

## 2016-12-13 ENCOUNTER — Ambulatory Visit: Payer: Medicare Other

## 2016-12-13 DIAGNOSIS — E1142 Type 2 diabetes mellitus with diabetic polyneuropathy: Secondary | ICD-10-CM

## 2016-12-13 DIAGNOSIS — M2142 Flat foot [pes planus] (acquired), left foot: Principal | ICD-10-CM

## 2016-12-13 DIAGNOSIS — M2141 Flat foot [pes planus] (acquired), right foot: Secondary | ICD-10-CM

## 2016-12-15 NOTE — Progress Notes (Signed)
Patient presents to be measured for diabetic shoes and inserts.  We will call when shoes and inserts arrive 

## 2016-12-23 DIAGNOSIS — C61 Malignant neoplasm of prostate: Secondary | ICD-10-CM | POA: Diagnosis not present

## 2016-12-30 DIAGNOSIS — C61 Malignant neoplasm of prostate: Secondary | ICD-10-CM | POA: Diagnosis not present

## 2017-01-10 ENCOUNTER — Ambulatory Visit (INDEPENDENT_AMBULATORY_CARE_PROVIDER_SITE_OTHER): Payer: Medicare Other | Admitting: Sports Medicine

## 2017-01-10 ENCOUNTER — Encounter: Payer: Self-pay | Admitting: Sports Medicine

## 2017-01-10 DIAGNOSIS — M2041 Other hammer toe(s) (acquired), right foot: Secondary | ICD-10-CM

## 2017-01-10 DIAGNOSIS — B351 Tinea unguium: Secondary | ICD-10-CM | POA: Diagnosis not present

## 2017-01-10 DIAGNOSIS — M2142 Flat foot [pes planus] (acquired), left foot: Secondary | ICD-10-CM

## 2017-01-10 DIAGNOSIS — M2042 Other hammer toe(s) (acquired), left foot: Secondary | ICD-10-CM

## 2017-01-10 DIAGNOSIS — M2141 Flat foot [pes planus] (acquired), right foot: Secondary | ICD-10-CM

## 2017-01-10 DIAGNOSIS — E1142 Type 2 diabetes mellitus with diabetic polyneuropathy: Secondary | ICD-10-CM

## 2017-01-10 NOTE — Progress Notes (Signed)
Subjective: Alfred Foster is a 71 y.o. male patient with history of diabetes who presents to office today complaining of long, painful nails  while ambulating in shoes; unable to trim. Patient states that the glucose reading this morning was "good". Admits he got a good report about his cancer. Patient denies any new changes in medication or new problems. Patient denies any new cramping, numbness, burning or tingling in the legs.  Patient Active Problem List   Diagnosis Date Noted  . Left shoulder pain 02/17/2016  . Lipoma of skin of abdomen 03/19/2015  . Nonspecific abnormal electrocardiogram (ECG) (EKG) 03/15/2012  . Essential hypertension 10/22/2010  . PROSTATE CANCER, HX OF 01/23/2009  . Diabetes type 2, controlled ([facility]) 08/09/2007  . Hyperlipidemia 05/08/2007   Current Outpatient Prescriptions on File Prior to Visit  Medication Sig Dispense Refill  . amLODipine (NORVASC) 10 MG tablet Take 1 tablet (10 mg total) by mouth daily. 90 tablet 3  . Ascorbic Acid (VITAMIN C PO) Take by mouth daily.      Marland Kitchen aspirin 81 MG tablet Take 81 mg by mouth daily.    . Cholecalciferol (VITAMIN D3) 1000 UNITS CAPS Take 1,000 Units by mouth daily.    . COD LIVER OIL PO Take by mouth daily.      . Diclofenac Sodium 1.5 % SOLN Use bid 1 Bottle 0  . folic acid (FOLVITE) Q000111Q MCG tablet Take 800 mcg by mouth daily.     . Lancets (ONETOUCH ULTRASOFT) lancets use as directed 100 each 1  . losartan (COZAAR) 100 MG tablet take 1 tablet by mouth once daily 90 tablet 3  . niacin 500 MG tablet Take 500 mg by mouth once.    . Omega-3 Fatty Acids (FISH OIL PO) Take 1,000 mg by mouth daily.     . ONE TOUCH ULTRA TEST test strip use as directed 100 each 1  . pravastatin (PRAVACHOL) 40 MG tablet take 1/2 tablet by mouth at bedtime 45 tablet 3   No current facility-administered medications on file prior to visit.    No Known Allergies  No results found for this or any previous visit (from the past 2160  hour(s)).  Objective: General: Patient is awake, alert, and oriented x 3 and in no acute distress.  Integument: Skin is warm, dry and supple bilateral. Nails are tender, long, thickened and dystrophic with subungual debris, consistent with onychomycosis, 1-5 bilateral. No signs of infection. No open lesions. + callus minimal at medial hallux present bilateral. Remaining integument unremarkable.  Vasculature:  Dorsalis Pedis pulse 2/4 bilateral. Posterior Tibial pulse  1/4 bilateral. Capillary fill time <3 sec 1-5 bilateral. Positive hair growth to the level of the digits.Temperature gradient within normal limits. No varicosities present bilateral. No edema present bilateral.   Neurology: The patient has intact sensation measured with a 5.07/10g Semmes Weinstein Monofilament at all pedal sites bilateral . Vibratory sensation diminished bilateral with tuning fork. No Babinski sign present bilateral.   Musculoskeletal: Asymptomatic bunion and planus foot type pedal deformities noted bilateral. Muscular strength 5/5 in all lower extremity muscular groups bilateral without pain on range of motion. No tenderness with calf compression bilateral.  Assessment and Plan: Problem List Items Addressed This Visit    None    Visit Diagnoses    Diabetic polyneuropathy associated with type 2 diabetes mellitus ([place])    -  Primary   Dermatophytosis of nail       Pes planus of both feet  Hammer toes of both feet          -Examined patient. -Discussed and educated patient on diabetic foot care, especially with  regards to the vascular, neurological and musculoskeletal systems.  -Stressed the importance of good glycemic control and the detriment of not  controlling glucose levels in relation to the foot. -Mechanically debrided all nails 1-5 bilateral using sterile nail nipper and filed with dremel without incident and smoothed callus areas with bur. -Awaiting diabetic shoes -Answered all patient  questions -Patient to return in 2.5 months for at risk foot care -Patient advised to call the office if any problems or questions arise in the meantime. Patient also asked about wife's diabetic shoes. Glass blower/designer, Programmer, systems checked and patient's wife information is not yet in [place]. Office manger will follow up with this request and we informed patient that we will let him know the status once this case is discussed with Melody.  Landis Martins, DPM

## 2017-01-26 ENCOUNTER — Encounter: Payer: Self-pay | Admitting: Sports Medicine

## 2017-02-03 NOTE — Telephone Encounter (Signed)
This is a [place] pt and the paperwork should be in [place]. Could someone please check on this please.

## 2017-03-14 ENCOUNTER — Encounter: Payer: Self-pay | Admitting: Sports Medicine

## 2017-03-14 ENCOUNTER — Ambulatory Visit (INDEPENDENT_AMBULATORY_CARE_PROVIDER_SITE_OTHER): Payer: Medicare Other | Admitting: Sports Medicine

## 2017-03-14 DIAGNOSIS — E1142 Type 2 diabetes mellitus with diabetic polyneuropathy: Secondary | ICD-10-CM | POA: Diagnosis not present

## 2017-03-14 DIAGNOSIS — M21619 Bunion of unspecified foot: Secondary | ICD-10-CM

## 2017-03-14 DIAGNOSIS — B351 Tinea unguium: Secondary | ICD-10-CM

## 2017-03-14 DIAGNOSIS — M2041 Other hammer toe(s) (acquired), right foot: Secondary | ICD-10-CM

## 2017-03-14 DIAGNOSIS — M2142 Flat foot [pes planus] (acquired), left foot: Secondary | ICD-10-CM

## 2017-03-14 DIAGNOSIS — M2042 Other hammer toe(s) (acquired), left foot: Secondary | ICD-10-CM

## 2017-03-14 DIAGNOSIS — M2141 Flat foot [pes planus] (acquired), right foot: Secondary | ICD-10-CM

## 2017-03-14 NOTE — Progress Notes (Signed)
Subjective: Alfred Foster is a 71 y.o. male patient with history of diabetes who returns to office today complaining of long, painful nails while ambulating in shoes; unable to trim. Patient states that the glucose reading this morning was "good". Admits he is glad that his wife is home from rehab. Patient denies any new changes in medication or new problems. Patient denies any new cramping, numbness, burning or tingling in the legs.  Patient is awaiting diabetic shoes.   Patient Active Problem List   Diagnosis Date Noted  . Left shoulder pain 02/17/2016  . Lipoma of skin of abdomen 03/19/2015  . Nonspecific abnormal electrocardiogram (ECG) (EKG) 03/15/2012  . Essential hypertension 10/22/2010  . PROSTATE CANCER, HX OF 01/23/2009  . Diabetes type 2, controlled ([place]) 08/09/2007  . Hyperlipidemia 05/08/2007   Current Outpatient Prescriptions on File Prior to Visit  Medication Sig Dispense Refill  . amLODipine (NORVASC) 10 MG tablet Take 1 tablet (10 mg total) by mouth daily. 90 tablet 3  . Ascorbic Acid (VITAMIN C PO) Take by mouth daily.      Marland Kitchen aspirin 81 MG tablet Take 81 mg by mouth daily.    . Cholecalciferol (VITAMIN D3) 1000 UNITS CAPS Take 1,000 Units by mouth daily.    . COD LIVER OIL PO Take by mouth daily.      . Diclofenac Sodium 1.5 % SOLN Use bid 1 Bottle 0  . folic acid (FOLVITE) 161 MCG tablet Take 800 mcg by mouth daily.     . Lancets (ONETOUCH ULTRASOFT) lancets use as directed 100 each 1  . losartan (COZAAR) 100 MG tablet take 1 tablet by mouth once daily 90 tablet 3  . niacin 500 MG tablet Take 500 mg by mouth once.    . Omega-3 Fatty Acids (FISH OIL PO) Take 1,000 mg by mouth daily.     . ONE TOUCH ULTRA TEST test strip use as directed 100 each 1  . pravastatin (PRAVACHOL) 40 MG tablet take 1/2 tablet by mouth at bedtime 45 tablet 3   No current facility-administered medications on file prior to visit.    No Known Allergies  No results found for this or any  previous visit (from the past 2160 hour(s)).  Objective: General: Patient is awake, alert, and oriented x 3 and in no acute distress.  Integument: Skin is warm, dry and supple bilateral. Nails are tender, long, thickened and dystrophic with subungual debris, consistent with onychomycosis, 1-5 bilateral. No signs of infection. No open lesions. + callus minimal at medial hallux present bilateral. Remaining integument unremarkable.  Vasculature:  Dorsalis Pedis pulse 2/4 bilateral. Posterior Tibial pulse  1/4 bilateral. Capillary fill time <3 sec 1-5 bilateral. Positive hair growth to the level of the digits.Temperature gradient within normal limits. No varicosities present bilateral. No edema present bilateral.   Neurology: The patient has intact sensation measured with a 5.07/10g Semmes Weinstein Monofilament at all pedal sites bilateral . Vibratory sensation diminished bilateral with tuning fork. No Babinski sign present bilateral.   Musculoskeletal: Asymptomatic bunion and planus foot type pedal deformities noted bilateral. Muscular strength 5/5 in all lower extremity muscular groups bilateral without pain on range of motion. No tenderness with calf compression bilateral.  Assessment and Plan: Problem List Items Addressed This Visit    None    Visit Diagnoses    Dermatophytosis of nail    -  Primary   Diabetic polyneuropathy associated with type 2 diabetes mellitus ([facility])       Pes  planus of both feet       Hammer toes of both feet       Bunion          -Examined patient. -Discussed and educated patient on diabetic foot care, especially with  regards to the vascular, neurological and musculoskeletal systems.  -Stressed the importance of good glycemic control and the detriment of not  controlling glucose levels in relation to the foot. -Mechanically debrided all nails 1-5 bilateral using sterile nail nipper and filed with dremel without incident and smoothed callus areas with  bur. -Awaiting diabetic shoes; Gave patient paperwork for Dr. Quay Burow to sign in order for him to get his diabetic shoes -Answered all patient questions -Patient to return in 2.5 to 3 months for at risk foot care -Patient advised to call the office if any problems or questions arise in the meantime.  Landis Martins, DPM

## 2017-03-15 DIAGNOSIS — H25013 Cortical age-related cataract, bilateral: Secondary | ICD-10-CM | POA: Diagnosis not present

## 2017-03-15 DIAGNOSIS — H2513 Age-related nuclear cataract, bilateral: Secondary | ICD-10-CM | POA: Diagnosis not present

## 2017-03-15 DIAGNOSIS — H5203 Hypermetropia, bilateral: Secondary | ICD-10-CM | POA: Diagnosis not present

## 2017-03-15 DIAGNOSIS — E119 Type 2 diabetes mellitus without complications: Secondary | ICD-10-CM | POA: Diagnosis not present

## 2017-03-15 LAB — HM DIABETES EYE EXAM

## 2017-03-17 ENCOUNTER — Other Ambulatory Visit: Payer: Self-pay | Admitting: Internal Medicine

## 2017-03-17 DIAGNOSIS — E785 Hyperlipidemia, unspecified: Secondary | ICD-10-CM

## 2017-03-21 ENCOUNTER — Ambulatory Visit: Payer: Medicare Other | Admitting: Sports Medicine

## 2017-03-23 ENCOUNTER — Encounter: Payer: Self-pay | Admitting: Internal Medicine

## 2017-03-23 NOTE — Progress Notes (Signed)
Result abstracted 

## 2017-03-28 ENCOUNTER — Other Ambulatory Visit: Payer: Medicare Other

## 2017-04-04 ENCOUNTER — Encounter: Payer: Self-pay | Admitting: Internal Medicine

## 2017-04-04 ENCOUNTER — Ambulatory Visit (INDEPENDENT_AMBULATORY_CARE_PROVIDER_SITE_OTHER): Payer: Medicare Other | Admitting: Internal Medicine

## 2017-04-04 ENCOUNTER — Other Ambulatory Visit (INDEPENDENT_AMBULATORY_CARE_PROVIDER_SITE_OTHER): Payer: Medicare Other

## 2017-04-04 VITALS — BP 182/96 | HR 82 | Ht 74.0 in | Wt 237.8 lb

## 2017-04-04 DIAGNOSIS — R079 Chest pain, unspecified: Secondary | ICD-10-CM | POA: Diagnosis not present

## 2017-04-04 DIAGNOSIS — I10 Essential (primary) hypertension: Secondary | ICD-10-CM

## 2017-04-04 DIAGNOSIS — E119 Type 2 diabetes mellitus without complications: Secondary | ICD-10-CM | POA: Diagnosis not present

## 2017-04-04 LAB — COMPREHENSIVE METABOLIC PANEL
ALBUMIN: 4.1 g/dL (ref 3.5–5.2)
ALK PHOS: 87 U/L (ref 39–117)
ALT: 22 U/L (ref 0–53)
AST: 20 U/L (ref 0–37)
BUN: 18 mg/dL (ref 6–23)
CO2: 23 mEq/L (ref 19–32)
Calcium: 9.1 mg/dL (ref 8.4–10.5)
Chloride: 109 mEq/L (ref 96–112)
Creatinine, Ser: 1.4 mg/dL (ref 0.40–1.50)
GFR: 64.48 mL/min (ref >60.00)
Glucose, Bld: 110 mg/dL — ABNORMAL HIGH (ref 70–99)
POTASSIUM: 4.1 meq/L (ref 3.5–5.1)
SODIUM: 139 meq/L (ref 135–145)
TOTAL PROTEIN: 7.1 g/dL (ref 6.0–8.3)
Total Bilirubin: 0.5 mg/dL (ref 0.2–1.2)

## 2017-04-04 LAB — HEMOGLOBIN A1C: Hgb A1c MFr Bld: 7.1 % — ABNORMAL HIGH (ref 4.6–6.5)

## 2017-04-04 MED ORDER — METOPROLOL SUCCINATE ER 50 MG PO TB24: 50.0000 mg | ORAL_TABLET | Freq: Every day | ORAL | 3 refills | Status: DC

## 2017-04-04 NOTE — Progress Notes (Signed)
Subjective:    Patient ID: Alfred Foster, male    DOB: 1946-12-09, 71 y.o.   MRN: [id number]  HPI He is here for follow up.  Hypertension: He is taking his medication daily. He is not compliant with a low sodium diet.  He denies chest pain, palpitations, edema, shortness of breath and regular headaches. He is exercising minimally.  He does monitor his blood pressure at home  160's/?.    Diabetes: He is taking his medication daily as prescribed. He is compliant with a diabetic diet. He is exercising a little. He checks his feet daily and denies foot lesions. He is up-to-date with an ophthalmology examination.   Pain in side:  He has pain in his right side.  It was worse 3-4 weeks ago.  It has gotten better.  It is much worse when he turns over in bed.  It started after he helped his wife out of her wheelchair. He has been wearing a back brace which has helped.  He has been doing some stretches.   Medications and allergies reviewed with patient and updated if appropriate.  Patient Active Problem List   Diagnosis Date Noted  . Left shoulder pain 02/17/2016  . Lipoma of skin of abdomen 03/19/2015  . Nonspecific abnormal electrocardiogram (ECG) (EKG) 03/15/2012  . Essential hypertension 10/22/2010  . PROSTATE CANCER, HX OF 01/23/2009  . Diabetes type 2, controlled ([place]) 08/09/2007  . Hyperlipidemia 05/08/2007    Current Outpatient Prescriptions on File Prior to Visit  Medication Sig Dispense Refill  . amLODipine (NORVASC) 10 MG tablet Take 1 tablet (10 mg total) by mouth daily. 90 tablet 3  . Ascorbic Acid (VITAMIN C PO) Take by mouth daily.      Marland Kitchen aspirin 81 MG tablet Take 81 mg by mouth daily.    . Cholecalciferol (VITAMIN D3) 1000 UNITS CAPS Take 1,000 Units by mouth daily.    . COD LIVER OIL PO Take by mouth daily.      . Diclofenac Sodium 1.5 % SOLN Use bid 1 Bottle 0  . folic acid (FOLVITE) 494 MCG tablet Take 800 mcg by mouth daily.     . Lancets (ONETOUCH ULTRASOFT) lancets  use as directed 100 each 1  . losartan (COZAAR) 100 MG tablet take 1 tablet by mouth once daily 90 tablet 3  . niacin 500 MG tablet Take 500 mg by mouth once.    . Omega-3 Fatty Acids (FISH OIL PO) Take 1,000 mg by mouth daily.     . ONE TOUCH ULTRA TEST test strip use as directed 100 each 1  . pravastatin (PRAVACHOL) 40 MG tablet take 1/2 tablet by mouth at bedtime 45 tablet 1   No current facility-administered medications on file prior to visit.     Past Medical History:  Diagnosis Date  . Diabetes mellitus   . Hyperlipidemia   . Hypertension   . Prostate cancer [facility]) 2007   Dr Alinda Money    Past Surgical History:  Procedure Laterality Date  . COLONOSCOPY  2004 & 2014   Negative ;[facility]  . INGUINAL HERNIA REPAIR    . PROSTATECTOMY  04/2006   Dr Alinda Money  . UMBILICAL HERNIA REPAIR      Social History   Social History  . Marital status: Married    Spouse name: N/A  . Number of children: N/A  . Years of education: N/A   Social History Main Topics  . Smoking status: Never Smoker  . Smokeless  tobacco: Never Used  . Alcohol use No  . Drug use: No  . Sexual activity: Not Asked   Other Topics Concern  . None   Social History Narrative  . None    Family History  Problem Relation Age of Onset  . Diabetes Father   . Hypertension Father   . Alzheimer's disease Father   . Hypertension Mother   . Alzheimer's disease Mother   . Heart attack Mother 10  . Diabetes Brother   . Heart disease Brother   . Heart attack Maternal Aunt     in 33s  . Stroke Neg Hx     Review of Systems  Constitutional: Negative for fever.  Respiratory: Negative for cough, shortness of breath and wheezing.   Cardiovascular: Negative for chest pain, palpitations and leg swelling.  Neurological: Negative for light-headedness, numbness and headaches.       Objective:   Vitals:   04/04/17 1042  BP: (!) 182/96  Pulse: 82   Filed Weights   04/04/17 1042  Weight: 237 lb 12.8 oz (107.9  kg)   Body mass index is 30.53 kg/m.  Wt Readings from Last 3 Encounters:  04/04/17 237 lb 12.8 oz (107.9 kg)  09/13/16 232 lb (105.2 kg)  02/17/16 228 lb (103.4 kg)     Physical Exam Constitutional: Appears well-developed and well-nourished. No distress.  HENT:  Head: Normocephalic and atraumatic.  Neck: Neck supple. No tracheal deviation present. No thyromegaly present.  No cervical lymphadenopathy Cardiovascular: Normal rate, regular rhythm and normal heart sounds.   No murmur heard. No carotid bruit .  No edema Pulmonary/Chest: Effort normal and breath sounds normal. No respiratory distress. No has no wheezes. No rales.  Feet:  b/l bunions right > left, dry skin b/l feet with some callus formation, normal sensation Skin: Skin is warm and dry. Not diaphoretic.  Psychiatric: Normal mood and affect. Behavior is normal.         Assessment & Plan:   See Problem List for Assessment and Plan of chronic medical problems.

## 2017-04-04 NOTE — Patient Instructions (Addendum)
Goal blood pressure is less than 140/90.   Test(s) ordered today. Your results will be released to [facility] (or called to you) after review, usually within 72hours after test completion. If any changes need to be made, you will be notified at that same time.   Medications reviewed and updated.  Changes include starting metoprolol 50 mg daily for your blood pressure.   Your prescription(s) have been submitted to your pharmacy. Please take as directed and contact our office if you believe you are having problem(s) with the medication(s).    Please followup in 6 months

## 2017-04-04 NOTE — Assessment & Plan Note (Signed)
Check a1c Low sugar / carb diet Stressed regular exercise Diabetic shoes annually - following with podiatry - form signed, foot exam complete

## 2017-04-04 NOTE — Assessment & Plan Note (Addendum)
Not controlled Stressed low sodium diet Continue norvasc and losartan Start metoprolol 50 mg daily Discussed goal BP - call if not under 140/90 cmp today

## 2017-04-04 NOTE — Assessment & Plan Note (Signed)
Related to lifting wife Improving with conservative treatment - continue back pain, stretches Discussed avoiding injury - lifting/helping wife in a safe way

## 2017-05-15 ENCOUNTER — Other Ambulatory Visit: Payer: Self-pay | Admitting: Internal Medicine

## 2017-05-15 DIAGNOSIS — I10 Essential (primary) hypertension: Secondary | ICD-10-CM

## 2017-06-07 NOTE — Patient Instructions (Addendum)
  Test(s) ordered today. Your results will be released to [facility] (or called to you) after review, usually within 72hours after test completion. If any changes need to be made, you will be notified at that same time.  No immunizations administered today.   Medications reviewed and updated.  Changes include adding hydrochlorothiazide 25 mg daily for your BP. Monitor your BP regularly at home.   Your prescription(s) have been submitted to your pharmacy. Please take as directed and contact our office if you believe you are having problem(s) with the medication(s).   Please followup in 6 months

## 2017-06-07 NOTE — Progress Notes (Signed)
Subjective:    Patient ID: Alfred Foster, male    DOB: 1946-01-14, 71 y.o.   MRN: [id number]  HPI The patient is here for follow up.  Hypertension: He is taking his medication daily. He is compliant with a low sodium diet.  He denies chest pain, palpitations, edema, shortness of breath and regular headaches. He is very active, not but exercising regularly.  He does monitor his blood pressure at home - 188/? Today, but does not check it regularly.    Diabetes: He is taking his medication daily as prescribed. He is compliant with a diabetic diet. He is active, but not exercising regularly. He checks his feet daily and denies foot lesions. He is up-to-date with an ophthalmology examination.   Hyperlipidemia: He is taking his medication daily. He is compliant with a low fat/cholesterol diet. He is active, but not exercising regularly. He denies myalgias.    Medications and allergies reviewed with patient and updated if appropriate.  Patient Active Problem List   Diagnosis Date Noted  . Right-sided chest pain 04/04/2017  . Left shoulder pain 02/17/2016  . Lipoma of skin of abdomen 03/19/2015  . Nonspecific abnormal electrocardiogram (ECG) (EKG) 03/15/2012  . Essential hypertension 10/22/2010  . PROSTATE CANCER, HX OF 01/23/2009  . Diabetes type 2, controlled ([place]) 08/09/2007  . Hyperlipidemia 05/08/2007    Current Outpatient Prescriptions on File Prior to Visit  Medication Sig Dispense Refill  . amLODipine (NORVASC) 10 MG tablet Take 1 tablet (10 mg total) by mouth daily. 90 tablet 3  . Ascorbic Acid (VITAMIN C PO) Take by mouth daily.      Marland Kitchen aspirin 81 MG tablet Take 81 mg by mouth daily.    . Cholecalciferol (VITAMIN D3) 1000 UNITS CAPS Take 1,000 Units by mouth daily.    . COD LIVER OIL PO Take by mouth daily.      . Diclofenac Sodium 1.5 % SOLN Use bid 1 Bottle 0  . folic acid (FOLVITE) 063 MCG tablet Take 800 mcg by mouth daily.     . Lancets (ONETOUCH ULTRASOFT) lancets use  as directed 100 each 1  . losartan (COZAAR) 100 MG tablet take 1 tablet by mouth once daily 90 tablet 3  . metoprolol succinate (TOPROL-XL) 50 MG 24 hr tablet Take 1 tablet (50 mg total) by mouth daily. Take with or immediately following a meal. 90 tablet 3  . niacin 500 MG tablet Take 500 mg by mouth once.    . Omega-3 Fatty Acids (FISH OIL PO) Take 1,000 mg by mouth daily.     . ONE TOUCH ULTRA TEST test strip use as directed 100 each 1  . pravastatin (PRAVACHOL) 40 MG tablet take 1/2 tablet by mouth at bedtime 45 tablet 1   No current facility-administered medications on file prior to visit.     Past Medical History:  Diagnosis Date  . Diabetes mellitus   . Hyperlipidemia   . Hypertension   . Prostate cancer [facility]) 2007   Dr Alinda Money    Past Surgical History:  Procedure Laterality Date  . COLONOSCOPY  2004 & 2014   Negative ;[facility]  . INGUINAL HERNIA REPAIR    . PROSTATECTOMY  04/2006   Dr Alinda Money  . UMBILICAL HERNIA REPAIR      Social History   Social History  . Marital status: Married    Spouse name: N/A  . Number of children: N/A  . Years of education: N/A   Social History  Main Topics  . Smoking status: Never Smoker  . Smokeless tobacco: Never Used  . Alcohol use No  . Drug use: No  . Sexual activity: Not on file   Other Topics Concern  . Not on file   Social History Narrative  . No narrative on file    Family History  Problem Relation Age of Onset  . Diabetes Father   . Hypertension Father   . Alzheimer's disease Father   . Hypertension Mother   . Alzheimer's disease Mother   . Heart attack Mother 58  . Diabetes Brother   . Heart disease Brother   . Heart attack Maternal Aunt        in 15s  . Stroke Neg Hx     Review of Systems  Constitutional: Negative for chills and fever.  Respiratory: Positive for shortness of breath (occasionally when bending over only, none with wallking). Negative for cough and wheezing.   Cardiovascular: Negative for  chest pain, palpitations and leg swelling.  Gastrointestinal: Negative for abdominal pain.       No gerd  Neurological: Negative for light-headedness and headaches.       Objective:   Vitals:   06/08/17 0844  BP: (!) 146/92  Pulse: 65  Resp: 16  Temp: 97.9 F (36.6 C)   Wt Readings from Last 3 Encounters:  06/08/17 231 lb (104.8 kg)  04/04/17 237 lb 12.8 oz (107.9 kg)  09/13/16 232 lb (105.2 kg)   Body mass index is 29.66 kg/m.   Physical Exam    Constitutional: Appears well-developed and well-nourished. No distress.  HENT:  Head: Normocephalic and atraumatic.  Neck: Neck supple. No tracheal deviation present. No thyromegaly present.  No cervical lymphadenopathy Cardiovascular: Normal rate, regular rhythm and normal heart sounds.   No murmur heard. No carotid bruit .  No edema Pulmonary/Chest: Effort normal and breath sounds normal. No respiratory distress. No has no wheezes. No rales.  Skin: Skin is warm and dry. Not diaphoretic.  Psychiatric: Normal mood and affect. Behavior is normal.      Assessment & Plan:    See Problem List for Assessment and Plan of chronic medical problems.

## 2017-06-08 ENCOUNTER — Other Ambulatory Visit (INDEPENDENT_AMBULATORY_CARE_PROVIDER_SITE_OTHER): Payer: Managed Care, Other (non HMO)

## 2017-06-08 ENCOUNTER — Encounter: Payer: Self-pay | Admitting: Internal Medicine

## 2017-06-08 ENCOUNTER — Ambulatory Visit (INDEPENDENT_AMBULATORY_CARE_PROVIDER_SITE_OTHER): Payer: Managed Care, Other (non HMO) | Admitting: Internal Medicine

## 2017-06-08 VITALS — BP 146/92 | HR 65 | Temp 97.9°F | Resp 16 | Ht 74.0 in | Wt 231.0 lb

## 2017-06-08 DIAGNOSIS — E7800 Pure hypercholesterolemia, unspecified: Secondary | ICD-10-CM

## 2017-06-08 DIAGNOSIS — E119 Type 2 diabetes mellitus without complications: Secondary | ICD-10-CM | POA: Diagnosis not present

## 2017-06-08 DIAGNOSIS — I10 Essential (primary) hypertension: Secondary | ICD-10-CM | POA: Diagnosis not present

## 2017-06-08 LAB — COMPREHENSIVE METABOLIC PANEL
ALBUMIN: 4.2 g/dL (ref 3.5–5.2)
ALK PHOS: 77 U/L (ref 39–117)
ALT: 17 U/L (ref 0–53)
AST: 16 U/L (ref 0–37)
BILIRUBIN TOTAL: 0.7 mg/dL (ref 0.2–1.2)
BUN: 24 mg/dL — ABNORMAL HIGH (ref 6–23)
CALCIUM: 9.5 mg/dL (ref 8.4–10.5)
CO2: 24 mEq/L (ref 19–32)
CREATININE: 1.44 mg/dL (ref 0.40–1.50)
Chloride: 111 mEq/L (ref 96–112)
GFR: 62.39 mL/min (ref >60.00)
Glucose, Bld: 129 mg/dL — ABNORMAL HIGH (ref 70–99)
Potassium: 4.2 mEq/L (ref 3.5–5.1)
Sodium: 143 mEq/L (ref 135–145)
Total Protein: 6.9 g/dL (ref 6.0–8.3)

## 2017-06-08 LAB — LIPID PANEL
CHOLESTEROL: 142 mg/dL (ref 0–200)
HDL: 31.8 mg/dL — ABNORMAL LOW (ref >39.00)
LDL Cholesterol: 96 mg/dL (ref 0–99)
NonHDL: 110.69
TRIGLYCERIDES: 75 mg/dL (ref 0.0–149.0)
Total CHOL/HDL Ratio: 4
VLDL: 15 mg/dL (ref 0.0–40.0)

## 2017-06-08 LAB — HEMOGLOBIN A1C: Hgb A1c MFr Bld: 7.2 % — ABNORMAL HIGH (ref 4.6–6.5)

## 2017-06-08 MED ORDER — HYDROCHLOROTHIAZIDE 25 MG PO TABS: 25.0000 mg | ORAL_TABLET | Freq: Every day | ORAL | 3 refills | Status: DC

## 2017-06-08 NOTE — Assessment & Plan Note (Signed)
Check a1c Low sugar / carb diet Stressed regular exercise, keeping weight down  

## 2017-06-08 NOTE — Assessment & Plan Note (Signed)
BP not ideally controlled Add hctz 25 mg daily Continue other medications cmp

## 2017-06-08 NOTE — Assessment & Plan Note (Signed)
Check lipid panel  Continue daily statin Regular exercise and healthy diet encouraged  

## 2017-06-13 ENCOUNTER — Encounter: Payer: Self-pay | Admitting: Sports Medicine

## 2017-06-13 ENCOUNTER — Ambulatory Visit (INDEPENDENT_AMBULATORY_CARE_PROVIDER_SITE_OTHER): Payer: Medicare Other | Admitting: Sports Medicine

## 2017-06-13 DIAGNOSIS — B351 Tinea unguium: Secondary | ICD-10-CM

## 2017-06-13 DIAGNOSIS — E1142 Type 2 diabetes mellitus with diabetic polyneuropathy: Secondary | ICD-10-CM

## 2017-06-13 NOTE — Progress Notes (Signed)
Subjective: Alfred Foster is a 71 y.o. male patient with history of diabetes who returns to office today complaining of long, painful nails while ambulating in shoes; unable to trim. Patient states that the glucose reading this morning was "good". Admits that he was started on fluid pill with improvement. No other issues.  Patient Active Problem List   Diagnosis Date Noted  . Left shoulder pain 02/17/2016  . Lipoma of skin of abdomen 03/19/2015  . Nonspecific abnormal electrocardiogram (ECG) (EKG) 03/15/2012  . Essential hypertension 10/22/2010  . PROSTATE CANCER, HX OF 01/23/2009  . Diabetes type 2, controlled ([place]) 08/09/2007  . Hyperlipidemia 05/08/2007   Current Outpatient Prescriptions on File Prior to Visit  Medication Sig Dispense Refill  . amLODipine (NORVASC) 10 MG tablet Take 1 tablet (10 mg total) by mouth daily. 90 tablet 3  . Ascorbic Acid (VITAMIN C PO) Take by mouth daily.      Marland Kitchen aspirin 81 MG tablet Take 81 mg by mouth daily.    . Cholecalciferol (VITAMIN D3) 1000 UNITS CAPS Take 1,000 Units by mouth daily.    . COD LIVER OIL PO Take by mouth daily.      . Diclofenac Sodium 1.5 % SOLN Use bid 1 Bottle 0  . folic acid (FOLVITE) 628 MCG tablet Take 800 mcg by mouth daily.     . hydrochlorothiazide (HYDRODIURIL) 25 MG tablet Take 1 tablet (25 mg total) by mouth daily. 90 tablet 3  . Lancets (ONETOUCH ULTRASOFT) lancets use as directed 100 each 1  . losartan (COZAAR) 100 MG tablet take 1 tablet by mouth once daily 90 tablet 3  . metoprolol succinate (TOPROL-XL) 50 MG 24 hr tablet Take 1 tablet (50 mg total) by mouth daily. Take with or immediately following a meal. 90 tablet 3  . niacin 500 MG tablet Take 500 mg by mouth once.    . Omega-3 Fatty Acids (FISH OIL PO) Take 1,000 mg by mouth daily.     . ONE TOUCH ULTRA TEST test strip use as directed 100 each 1  . pravastatin (PRAVACHOL) 40 MG tablet take 1/2 tablet by mouth at bedtime 45 tablet 1   No current  facility-administered medications on file prior to visit.    No Known Allergies  Recent Results (from the past 2160 hour(s))  Hemoglobin A1c     Status: Abnormal   Collection Time: 04/04/17 11:26 AM  Result Value Ref Range   Hgb A1c MFr Bld 7.1 (H) 4.6 - 6.5 %    Comment: Glycemic Control Guidelines for People with Diabetes:Non Diabetic:  <6%Goal of Therapy: <7%Additional Action Suggested:  >8%   Comprehensive metabolic panel     Status: Abnormal   Collection Time: 04/04/17 11:34 AM  Result Value Ref Range   Sodium 139 135 - 145 mEq/L   Potassium 4.1 3.5 - 5.1 mEq/L   Chloride 109 96 - 112 mEq/L   CO2 23 19 - 32 mEq/L   Glucose, Bld 110 (H) 70 - 99 mg/dL   BUN 18 6 - 23 mg/dL   Creatinine, Ser 1.40 0.40 - 1.50 mg/dL   Total Bilirubin 0.5 0.2 - 1.2 mg/dL   Alkaline Phosphatase 87 39 - 117 U/L   AST 20 0 - 37 U/L   ALT 22 0 - 53 U/L   Total Protein 7.1 6.0 - 8.3 g/dL   Albumin 4.1 3.5 - 5.2 g/dL   Calcium 9.1 8.4 - 10.5 mg/dL   GFR 64.48 >60.00 mL/min  Hemoglobin  A1c     Status: Abnormal   Collection Time: 06/08/17  9:06 AM  Result Value Ref Range   Hgb A1c MFr Bld 7.2 (H) 4.6 - 6.5 %    Comment: Glycemic Control Guidelines for People with Diabetes:Non Diabetic:  <6%Goal of Therapy: <7%Additional Action Suggested:  >8%   Comprehensive metabolic panel     Status: Abnormal   Collection Time: 06/08/17  9:06 AM  Result Value Ref Range   Sodium 143 135 - 145 mEq/L   Potassium 4.2 3.5 - 5.1 mEq/L   Chloride 111 96 - 112 mEq/L   CO2 24 19 - 32 mEq/L   Glucose, Bld 129 (H) 70 - 99 mg/dL   BUN 24 (H) 6 - 23 mg/dL   Creatinine, Ser 1.44 0.40 - 1.50 mg/dL   Total Bilirubin 0.7 0.2 - 1.2 mg/dL   Alkaline Phosphatase 77 39 - 117 U/L   AST 16 0 - 37 U/L   ALT 17 0 - 53 U/L   Total Protein 6.9 6.0 - 8.3 g/dL   Albumin 4.2 3.5 - 5.2 g/dL   Calcium 9.5 8.4 - 10.5 mg/dL   GFR 62.39 >60.00 mL/min  Lipid panel     Status: Abnormal   Collection Time: 06/08/17  9:06 AM  Result Value Ref  Range   Cholesterol 142 0 - 200 mg/dL    Comment: ATP III Classification       Desirable:  < 200 mg/dL               Borderline High:  200 - 239 mg/dL          High:  > = 240 mg/dL   Triglycerides 75.0 0.0 - 149.0 mg/dL    Comment: Normal:  <150 mg/dLBorderline High:  150 - 199 mg/dL   HDL 31.80 (L) >39.00 mg/dL   VLDL 15.0 0.0 - 40.0 mg/dL   LDL Cholesterol 96 0 - 99 mg/dL   Total CHOL/HDL Ratio 4     Comment:                Men          Women1/2 Average Risk     3.4          3.3Average Risk          5.0          4.42X Average Risk          9.6          7.13X Average Risk          15.0          11.0                       NonHDL 110.69     Comment: NOTE:  Non-HDL goal should be 30 mg/dL higher than patient's LDL goal (i.e. LDL goal of < 70 mg/dL, would have non-HDL goal of < 100 mg/dL)    Objective: General: Patient is awake, alert, and oriented x 3 and in no acute distress.  Integument: Skin is warm, dry and supple bilateral. Nails are tender, long, thickened and dystrophic with subungual debris, consistent with onychomycosis, 1-5 bilateral. No signs of infection. No open lesions. + callus minimal at medial hallux present bilateral. Remaining integument unremarkable.  Vasculature:  Dorsalis Pedis pulse 2/4 bilateral. Posterior Tibial pulse  1/4 bilateral. Capillary fill time <3 sec 1-5 bilateral. Positive hair growth to the level of the digits.Temperature gradient  within normal limits. No varicosities present bilateral. No edema present bilateral.   Neurology: The patient has intact sensation measured with a 5.07/10g Semmes Weinstein Monofilament at all pedal sites bilateral . Vibratory sensation diminished bilateral with tuning fork. No Babinski sign present bilateral.   Musculoskeletal: Asymptomatic bunion and planus foot type pedal deformities noted bilateral. Muscular strength 5/5 in all lower extremity muscular groups bilateral without pain on range of motion. No tenderness with calf  compression bilateral.  Assessment and Plan: Problem List Items Addressed This Visit    None    Visit Diagnoses    Dermatophytosis of nail    -  Primary   Diabetic polyneuropathy associated with type 2 diabetes mellitus ([place])          -Examined patient. -Discussed and educated patient on diabetic foot care, especially with  regards to the vascular, neurological and musculoskeletal systems.  -Stressed the importance of good glycemic control and the detriment of not  controlling glucose levels in relation to the foot. -Mechanically debrided all nails 1-5 bilateral using sterile nail nipper and filed with dremel without incident and smoothed callus areas with bur. -Continue with diabetic shoes -Answered all patient questions -Patient to return in 3 months for at risk foot care -Patient advised to call the office if any problems or questions arise in the meantime.   Landis Martins, DPM

## 2017-06-15 ENCOUNTER — Telehealth: Payer: Self-pay | Admitting: Internal Medicine

## 2017-06-15 NOTE — Telephone Encounter (Signed)
Hold the hctz and needs to come in for an appt to evaluate the Shortness of breath  To make sure there is not another cause

## 2017-06-15 NOTE — Telephone Encounter (Signed)
Contacted and stated awareness 

## 2017-06-15 NOTE — Telephone Encounter (Signed)
Should patient come in for visit?

## 2017-06-15 NOTE — Telephone Encounter (Signed)
Refer to PCP

## 2017-06-15 NOTE — Telephone Encounter (Signed)
The pts wife called stating that he was recently prescribed hydrochlorothiazide (HYDRODIURIL) 25 MG tablet. He started taking it on 06/09/17. On 06/13/2017 she noticed that he was having shortness of breath and was very thirsty. They were out and he had to stop multiple times on the way home to stop to get something to drink. She also said that he had some shortness of breath that is not normal for him. When they got home he was very tired and slept most of the afternoon. She also said that he slept and rested most of the day yesterday. When he took the trash out he had shortness of breath also. Please advise.

## 2017-06-22 NOTE — Progress Notes (Signed)
Subjective:    Patient ID: Alfred Foster, male    DOB: 01/18/1946, 71 y.o.   MRN: [id number]  HPI The patient is here for follow up.  Hypertension:  Started hctz 06/08/17 and he was getting dizzy and shortness of breath.  He has been on the medication 3-4 days.  All his symptoms went away when he stopped the medication.  He feels he is compliant with a low-sodium diet. He denies any chest pain, regular shortness of breath, leg edema, palpitations and headaches.  Medications and allergies reviewed with patient and updated if appropriate.  Patient Active Problem List   Diagnosis Date Noted  . Left shoulder pain 02/17/2016  . Lipoma of skin of abdomen 03/19/2015  . Nonspecific abnormal electrocardiogram (ECG) (EKG) 03/15/2012  . Essential hypertension 10/22/2010  . PROSTATE CANCER, HX OF 01/23/2009  . Diabetes type 2, controlled ([place]) 08/09/2007  . Hyperlipidemia 05/08/2007    Current Outpatient Prescriptions on File Prior to Visit  Medication Sig Dispense Refill  . amLODipine (NORVASC) 10 MG tablet Take 1 tablet (10 mg total) by mouth daily. 90 tablet 3  . Ascorbic Acid (VITAMIN C PO) Take by mouth daily.      Marland Kitchen aspirin 81 MG tablet Take 81 mg by mouth daily.    . Cholecalciferol (VITAMIN D3) 1000 UNITS CAPS Take 1,000 Units by mouth daily.    . COD LIVER OIL PO Take by mouth daily.      . Diclofenac Sodium 1.5 % SOLN Use bid 1 Bottle 0  . folic acid (FOLVITE) 878 MCG tablet Take 800 mcg by mouth daily.     . Lancets (ONETOUCH ULTRASOFT) lancets use as directed 100 each 1  . losartan (COZAAR) 100 MG tablet take 1 tablet by mouth once daily 90 tablet 3  . metoprolol succinate (TOPROL-XL) 50 MG 24 hr tablet Take 1 tablet (50 mg total) by mouth daily. Take with or immediately following a meal. 90 tablet 3  . niacin 500 MG tablet Take 500 mg by mouth once.    . Omega-3 Fatty Acids (FISH OIL PO) Take 1,000 mg by mouth daily.     . ONE TOUCH ULTRA TEST test strip use as directed 100  each 1  . pravastatin (PRAVACHOL) 40 MG tablet take 1/2 tablet by mouth at bedtime 45 tablet 1  . hydrochlorothiazide (HYDRODIURIL) 25 MG tablet Take 1 tablet (25 mg total) by mouth daily. (Patient not taking: Reported on 06/23/2017) 90 tablet 3   No current facility-administered medications on file prior to visit.     Past Medical History:  Diagnosis Date  . Diabetes mellitus   . Hyperlipidemia   . Hypertension   . Prostate cancer [facility]) 2007   Dr Alinda Money    Past Surgical History:  Procedure Laterality Date  . COLONOSCOPY  2004 & 2014   Negative ;[facility]  . INGUINAL HERNIA REPAIR    . PROSTATECTOMY  04/2006   Dr Alinda Money  . UMBILICAL HERNIA REPAIR      Social History   Social History  . Marital status: Married    Spouse name: N/A  . Number of children: N/A  . Years of education: N/A   Social History Main Topics  . Smoking status: Never Smoker  . Smokeless tobacco: Never Used  . Alcohol use No  . Drug use: No  . Sexual activity: Not Asked   Other Topics Concern  . None   Social History Narrative  . None  Family History  Problem Relation Age of Onset  . Diabetes Father   . Hypertension Father   . Alzheimer's disease Father   . Hypertension Mother   . Alzheimer's disease Mother   . Heart attack Mother 57  . Diabetes Brother   . Heart disease Brother   . Heart attack Maternal Aunt        in 3s  . Stroke Neg Hx     Review of Systems  Constitutional: Negative for fever.  Respiratory: Positive for shortness of breath (occasional).   Cardiovascular: Negative for chest pain, palpitations and leg swelling.  Neurological: Positive for light-headedness (occ with changes in position). Negative for headaches.       Objective:   Vitals:   06/23/17 1601  BP: 140/82  Pulse: 80  Resp: 18  Temp: 98 F (36.7 C)   Wt Readings from Last 3 Encounters:  06/23/17 229 lb (103.9 kg)  06/08/17 231 lb (104.8 kg)  04/04/17 237 lb 12.8 oz (107.9 kg)   Body mass  index is 29.4 kg/m.   Physical Exam    Constitutional: Appears well-developed and well-nourished. No distress.  HENT:  Head: Normocephalic and atraumatic.  Neck: Neck supple. No tracheal deviation present. No thyromegaly present.  No cervical lymphadenopathy Cardiovascular: Normal rate, regular rhythm and normal heart sounds.   No murmur heard. No carotid bruit .  No edema Pulmonary/Chest: Effort normal and breath sounds normal. No respiratory distress. No has no wheezes. No rales.  Skin: Skin is warm and dry. Not diaphoretic.  Psychiatric: Normal mood and affect. Behavior is normal.      Assessment & Plan:    See Problem List for Assessment and Plan of chronic medical problems.

## 2017-06-23 ENCOUNTER — Encounter: Payer: Self-pay | Admitting: Internal Medicine

## 2017-06-23 ENCOUNTER — Ambulatory Visit (INDEPENDENT_AMBULATORY_CARE_PROVIDER_SITE_OTHER): Payer: Managed Care, Other (non HMO) | Admitting: Internal Medicine

## 2017-06-23 VITALS — BP 140/82 | HR 80 | Temp 98.0°F | Resp 18 | Wt 229.0 lb

## 2017-06-23 DIAGNOSIS — E7800 Pure hypercholesterolemia, unspecified: Secondary | ICD-10-CM | POA: Diagnosis not present

## 2017-06-23 DIAGNOSIS — I10 Essential (primary) hypertension: Secondary | ICD-10-CM | POA: Diagnosis not present

## 2017-06-23 MED ORDER — LOSARTAN POTASSIUM 100 MG PO TABS: 100.0000 mg | ORAL_TABLET | Freq: Every day | ORAL | 3 refills | Status: DC

## 2017-06-23 MED ORDER — AMLODIPINE BESYLATE 10 MG PO TABS: 10.0000 mg | ORAL_TABLET | Freq: Every day | ORAL | 3 refills | Status: DC

## 2017-06-23 MED ORDER — METOPROLOL SUCCINATE ER 50 MG PO TB24: 50.0000 mg | ORAL_TABLET | Freq: Every day | ORAL | 3 refills | Status: DC

## 2017-06-23 MED ORDER — PRAVASTATIN SODIUM 40 MG PO TABS: 20.0000 mg | ORAL_TABLET | Freq: Every day | ORAL | 3 refills | Status: DC

## 2017-06-23 NOTE — Patient Instructions (Addendum)
Your blood pressure goal is less than 140/90.   Monitor your BP at home home.  Call if it is elevated.     Medications reviewed and updated.  No changes recommended at this time.   Please followup in October as scheduled.

## 2017-06-23 NOTE — Assessment & Plan Note (Signed)
Pressure borderline here today Stop hydrochlorothiazide, which we started recently-he did not tolerate it. It causes shortness of breath and dizziness. Symptoms resolved when he stopped it Continue current medications at current doses He agrees to monitor his blood pressure at home regularly-discussed goal of less than 140/90 Follow-up at physical in October, sooner if blood pressure is not controlled

## 2017-07-14 ENCOUNTER — Ambulatory Visit: Payer: Medicare Other | Admitting: Internal Medicine

## 2017-09-14 ENCOUNTER — Encounter: Payer: Self-pay | Admitting: Internal Medicine

## 2017-09-14 NOTE — Patient Instructions (Addendum)
Continue doing brain stimulating activities (puzzles, reading, adult coloring books, staying active) to keep memory sharp.   Continue to eat heart healthy diet (full of fruits, vegetables, whole grains, lean protein, water--limit salt, fat, and sugar intake) and increase physical activity as tolerated.   [name] , Thank you for taking time to come for your Medicare Wellness Visit. I appreciate your ongoing commitment to your health goals. Please review the following plan we discussed and let me know if I can assist you in the future.   These are the goals we discussed: Goals    None      This is a list of the screening recommended for you and due dates:  Health Maintenance  Topic Date Due  . Flu Shot  07/12/2017  . Hemoglobin A1C  12/08/2017  . Eye exam for diabetics  03/15/2018  . Complete foot exam   06/13/2018  . Tetanus Vaccine  04/21/2020  . Colon Cancer Screening  06/25/2023  .  Hepatitis C: One time screening is recommended by Center for Disease Control  (CDC) for  adults born from [birth year] through 1964.   Completed  . Pneumonia vaccines  Completed     Test(s) ordered today. Your results will be released to [place] (or called to you) after review, usually within 72hours after test completion. If any changes need to be made, you will be notified at that same time.  All other Health Maintenance issues reviewed.   All recommended immunizations and age-appropriate screenings are up-to-date or discussed.  No immunizations administered today.   Medications reviewed and updated.  Changes include  /  No changes recommended at this time.  Your prescription(s) have been submitted to your pharmacy. Please take as directed and contact our office if you believe you are having problem(s) with the medication(s).  A referral was ordered for   Please followup in 6 months    Health Maintenance, Male A healthy lifestyle and preventive care is important for your health and wellness. Ask  your health care provider about what schedule of regular examinations is right for you. What should I know about weight and diet? Eat a Healthy Diet  Eat plenty of vegetables, fruits, whole grains, low-fat dairy products, and lean protein.  Do not eat a lot of foods high in solid fats, added sugars, or salt.  Maintain a Healthy Weight Regular exercise can help you achieve or maintain a healthy weight. You should:  Do at least 150 minutes of exercise each week. The exercise should increase your heart rate and make you sweat (moderate-intensity exercise).  Do strength-training exercises at least twice a week.  Watch Your Levels of Cholesterol and Blood Lipids  Have your blood tested for lipids and cholesterol every 5 years starting at 70 years of age. If you are at high risk for heart disease, you should start having your blood tested when you are 70 years old. You may need to have your cholesterol levels checked more often if: ? Your lipid or cholesterol levels are high. ? You are older than 70 years of age. ? You are at high risk for heart disease.  What should I know about cancer screening? Many types of cancers can be detected early and may often be prevented. Lung Cancer  You should be screened every year for lung cancer if: ? You are a current smoker who has smoked for at least 30 years. ? You are a former smoker who has quit within the past 15  years.  Talk to your health care provider about your screening options, when you should start screening, and how often you should be screened.  Colorectal Cancer  Routine colorectal cancer screening usually begins at 70 years of age and should be repeated every 5-10 years until you are 70 years old. You may need to be screened more often if early forms of precancerous polyps or small growths are found. Your health care provider may recommend screening at an earlier age if you have risk factors for colon cancer.  Your health care  provider may recommend using home test kits to check for hidden blood in the stool.  A small camera at the end of a tube can be used to examine your colon (sigmoidoscopy or colonoscopy). This checks for the earliest forms of colorectal cancer.  Prostate and Testicular Cancer  Depending on your age and overall health, your health care provider may do certain tests to screen for prostate and testicular cancer.  Talk to your health care provider about any symptoms or concerns you have about testicular or prostate cancer.  Skin Cancer  Check your skin from head to toe regularly.  Tell your health care provider about any new moles or changes in moles, especially if: ? There is a change in a mole's size, shape, or color. ? You have a mole that is larger than a pencil eraser.  Always use sunscreen. Apply sunscreen liberally and repeat throughout the day.  Protect yourself by wearing long sleeves, pants, a wide-brimmed hat, and sunglasses when outside.  What should I know about heart disease, diabetes, and high blood pressure?  If you are 14-96 years of age, have your blood pressure checked every 3-5 years. If you are 92 years of age or older, have your blood pressure checked every year. You should have your blood pressure measured twice-once when you are at a hospital or clinic, and once when you are not at a hospital or clinic. Record the average of the two measurements. To check your blood pressure when you are not at a hospital or clinic, you can use: ? An automated blood pressure machine at a pharmacy. ? A home blood pressure monitor.  Talk to your health care provider about your target blood pressure.  If you are between 62-68 years old, ask your health care provider if you should take aspirin to prevent heart disease.  Have regular diabetes screenings by checking your fasting blood sugar level. ? If you are at a normal weight and have a low risk for diabetes, have this test once every  three years after the age of 30. ? If you are overweight and have a high risk for diabetes, consider being tested at a younger age or more often.  A one-time screening for abdominal aortic aneurysm (AAA) by ultrasound is recommended for men aged 3-75 years who are current or former smokers. What should I know about preventing infection? Hepatitis B If you have a higher risk for hepatitis B, you should be screened for this virus. Talk with your health care provider to find out if you are at risk for hepatitis B infection. Hepatitis C Blood testing is recommended for:  Everyone born from [birth year] through 1965.  Anyone with known risk factors for hepatitis C.  Sexually Transmitted Diseases (STDs)  You should be screened each year for STDs including gonorrhea and chlamydia if: ? You are sexually active and are younger than 70 years of age. ? You are older than 70  years of age and your health care provider tells you that you are at risk for this type of infection. ? Your sexual activity has changed since you were last screened and you are at an increased risk for chlamydia or gonorrhea. Ask your health care provider if you are at risk.  Talk with your health care provider about whether you are at high risk of being infected with HIV. Your health care provider may recommend a prescription medicine to help prevent HIV infection.  What else can I do?  Schedule regular health, dental, and eye exams.  Stay current with your vaccines (immunizations).  Do not use any tobacco products, such as cigarettes, chewing tobacco, and e-cigarettes. If you need help quitting, ask your health care provider.  Limit alcohol intake to no more than 2 drinks per day. One drink equals 12 ounces of beer, 5 ounces of wine, or 1 ounces of hard liquor.  Do not use street drugs.  Do not share needles.  Ask your health care provider for help if you need support or information about quitting drugs.  Tell your health  care provider if you often feel depressed.  Tell your health care provider if you have ever been abused or do not feel safe at home. This information is not intended to replace advice given to you by your health care provider. Make sure you discuss any questions you have with your health care provider. Document Released: 05/26/2008 Document Revised: 07/27/2016 Document Reviewed: 09/01/2015 Elsevier Interactive Patient Education  [employer].

## 2017-09-14 NOTE — Progress Notes (Signed)
Subjective:    Patient ID: Alfred Foster, male    DOB: 1946/04/19, 71 y.o.   MRN: [id number]  HPI Here for an annual physical exam.   BP at home 135/74, occasionally 140/?.  He is eating good overall.  He is exercising weight lifting, push ups, sit ups, yard work.  He is still working.   He feels good overall and has no concerns.    Medications and allergies reviewed with patient and updated if appropriate.  Patient Active Problem List   Diagnosis Date Noted  . Left shoulder pain 02/17/2016  . Lipoma of skin of abdomen 03/19/2015  . Nonspecific abnormal electrocardiogram (ECG) (EKG) 03/15/2012  . Essential hypertension 10/22/2010  . PROSTATE CANCER, HX OF 01/23/2009  . Diabetes type 2, controlled ([place]) 08/09/2007  . Hyperlipidemia 05/08/2007    Current Outpatient Prescriptions on File Prior to Visit  Medication Sig Dispense Refill  . amLODipine (NORVASC) 10 MG tablet Take 1 tablet (10 mg total) by mouth daily. 90 tablet 3  . Ascorbic Acid (VITAMIN C PO) Take by mouth daily.      Marland Kitchen aspirin 81 MG tablet Take 81 mg by mouth daily.    . Cholecalciferol (VITAMIN D3) 1000 UNITS CAPS Take 1,000 Units by mouth daily.    . COD LIVER OIL PO Take by mouth daily.      . Diclofenac Sodium 1.5 % SOLN Use bid 1 Bottle 0  . folic acid (FOLVITE) 578 MCG tablet Take 800 mcg by mouth daily.     . Lancets (ONETOUCH ULTRASOFT) lancets use as directed 100 each 1  . losartan (COZAAR) 100 MG tablet Take 1 tablet (100 mg total) by mouth daily. 90 tablet 3  . metoprolol succinate (TOPROL-XL) 50 MG 24 hr tablet Take 1 tablet (50 mg total) by mouth daily. Take with or immediately following a meal. 90 tablet 3  . niacin 500 MG tablet Take 500 mg by mouth once.    . Omega-3 Fatty Acids (FISH OIL PO) Take 1,000 mg by mouth daily.     . ONE TOUCH ULTRA TEST test strip use as directed 100 each 1  . pravastatin (PRAVACHOL) 40 MG tablet Take 0.5 tablets (20 mg total) by mouth at bedtime. 45 tablet 3   No  current facility-administered medications on file prior to visit.     Past Medical History:  Diagnosis Date  . Diabetes mellitus   . Hyperlipidemia   . Hypertension   . Prostate cancer [facility]) 2007   Dr Alinda Money    Past Surgical History:  Procedure Laterality Date  . COLONOSCOPY  2004 & 2014   Negative ;[facility]  . INGUINAL HERNIA REPAIR    . PROSTATECTOMY  04/2006   Dr Alinda Money  . UMBILICAL HERNIA REPAIR      Social History   Social History  . Marital status: Married    Spouse name: N/A  . Number of children: N/A  . Years of education: N/A   Social History Main Topics  . Smoking status: Never Smoker  . Smokeless tobacco: Never Used  . Alcohol use No  . Drug use: No  . Sexual activity: Not Asked   Other Topics Concern  . None   Social History Narrative  . None    Family History  Problem Relation Age of Onset  . Diabetes Father   . Hypertension Father   . Alzheimer's disease Father   . Hypertension Mother   . Alzheimer's disease Mother   .  Heart attack Mother 31  . Diabetes Brother   . Heart disease Brother   . Heart attack Maternal Aunt        in 60s  . Stroke Neg Hx     Review of Systems  Constitutional: Negative for appetite change, chills, fatigue and fever.  Eyes: Negative for visual disturbance.  Respiratory: Positive for shortness of breath (bending over and when he stands up). Negative for cough and wheezing.   Cardiovascular: Negative for chest pain, palpitations and leg swelling.  Gastrointestinal: Negative for abdominal pain, blood in stool, constipation, diarrhea and nausea.       No gerd  Genitourinary: Negative for difficulty urinating, dysuria and hematuria.  Musculoskeletal: Negative for arthralgias and back pain.  Skin: Negative for color change and rash.  Neurological: Negative for light-headedness and headaches.  Psychiatric/Behavioral: Negative for dysphoric mood. The patient is not nervous/anxious.        Objective:   Vitals:     09/15/17 1426  BP: (!) 140/92  Pulse: 70  Resp: 16  Temp: 97.7 F (36.5 C)  SpO2: 98%   Filed Weights   09/15/17 1426  Weight: 228 lb (103.4 kg)   Body mass index is 29.27 kg/m.  Wt Readings from Last 3 Encounters:  09/15/17 228 lb (103.4 kg)  06/23/17 229 lb (103.9 kg)  06/08/17 231 lb (104.8 kg)     Physical Exam Constitutional: He appears well-developed and well-nourished. No distress.  HENT:  Head: Normocephalic and atraumatic.  Right Ear: External ear normal.  Left Ear: External ear normal.  Mouth/Throat: Oropharynx is clear and moist.  Normal ear canals and TM b/l  Eyes: Conjunctivae and EOM are normal.  Neck: Neck supple. No tracheal deviation present. No thyromegaly present.  Right carotid bruit  Cardiovascular: Normal rate, regular rhythm, normal heart sounds and intact distal pulses.   No murmur heard. Pulmonary/Chest: Effort normal and breath sounds normal. No respiratory distress. He has no wheezes. He has no rales.  Abdominal: Soft. He exhibits no distension. There is no tenderness.  Genitourinary: deferred  Musculoskeletal: He exhibits no edema.  Lymphadenopathy:   He has no cervical adenopathy.  Skin: Skin is warm and dry. He is not diaphoretic.  Psychiatric: He has a normal mood and affect. His behavior is normal.    Complete diabetic foot exam done Diabetic Foot Form - Detailed   Diabetic Foot Exam - detailed Diabetic Foot exam was performed with the following findings:  Yes 09/15/2017  2:51 PM  Visual Foot Exam completed.:  Yes  Can the patient see the bottom of their feet?:  Yes Are the shoes appropriate in style and fit?:  Yes Is there swelling or and abnormal foot shape?:  No Is there a claw toe deformity?:  Yes (Comment: right bunion) Is there elevated skin temparature?:  No Is there foot or ankle muscle weakness?:  No Normal Range of Motion:  Yes Pulse Foot Exam completed.:  Yes  Right posterior Tibialias:  Present Left posterior  Tibialias:  Present  Right Dorsalis Pedis:  Present Left Dorsalis Pedis:  Present  Sensory Foot Exam Completed.:  Yes Semmes-Weinstein Monofilament Test R Site 1-Great Toe:  Pos L Site 1-Great Toe:  Pos    Comments:  Thickening of skin in areas - callus formation     .     Assessment & Plan:    Physical exam: Screening blood work  ordered Immunizations   Flu vaccine today,  shingrix already complete, other up to date  Colonoscopy   Up to date  Eye exam   Up to date  EKG    Last EKG 2013 Exercise  Doing weights, push ups, sit ups, leg exercises Weight   Normal BMI Skin  No concerns Substance abuse  none  See Problem List for Assessment and Plan of chronic medical problems.  FU in 6 months

## 2017-09-15 ENCOUNTER — Ambulatory Visit (INDEPENDENT_AMBULATORY_CARE_PROVIDER_SITE_OTHER): Payer: Managed Care, Other (non HMO) | Admitting: Internal Medicine

## 2017-09-15 ENCOUNTER — Encounter: Payer: Self-pay | Admitting: Internal Medicine

## 2017-09-15 ENCOUNTER — Other Ambulatory Visit (INDEPENDENT_AMBULATORY_CARE_PROVIDER_SITE_OTHER): Payer: Managed Care, Other (non HMO)

## 2017-09-15 ENCOUNTER — Telehealth: Payer: Self-pay | Admitting: *Deleted

## 2017-09-15 VITALS — BP 140/92 | HR 70 | Temp 97.7°F | Resp 16 | Wt 228.0 lb

## 2017-09-15 DIAGNOSIS — E7800 Pure hypercholesterolemia, unspecified: Secondary | ICD-10-CM

## 2017-09-15 DIAGNOSIS — Z0000 Encounter for general adult medical examination without abnormal findings: Secondary | ICD-10-CM

## 2017-09-15 DIAGNOSIS — Z8546 Personal history of malignant neoplasm of prostate: Secondary | ICD-10-CM

## 2017-09-15 DIAGNOSIS — R0989 Other specified symptoms and signs involving the circulatory and respiratory systems: Secondary | ICD-10-CM

## 2017-09-15 DIAGNOSIS — I10 Essential (primary) hypertension: Secondary | ICD-10-CM | POA: Diagnosis not present

## 2017-09-15 DIAGNOSIS — E119 Type 2 diabetes mellitus without complications: Secondary | ICD-10-CM

## 2017-09-15 DIAGNOSIS — Z23 Encounter for immunization: Secondary | ICD-10-CM | POA: Diagnosis not present

## 2017-09-15 DIAGNOSIS — Z0001 Encounter for general adult medical examination with abnormal findings: Secondary | ICD-10-CM

## 2017-09-15 LAB — CBC WITH DIFFERENTIAL/PLATELET
BASOS ABS: 0 10*3/uL (ref 0.0–0.1)
Basophils Relative: 0.6 % (ref 0.0–3.0)
Eosinophils Absolute: 0.2 10*3/uL (ref 0.0–0.7)
Eosinophils Relative: 3.2 % (ref 0.0–5.0)
HEMATOCRIT: 42.7 % (ref 39.0–52.0)
HEMOGLOBIN: 14.3 g/dL (ref 13.0–17.0)
LYMPHS PCT: 36.8 % (ref 12.0–46.0)
Lymphs Abs: 2.6 10*3/uL (ref 0.7–4.0)
MCHC: 33.5 g/dL (ref 30.0–36.0)
MCV: 95.6 fl (ref 78.0–100.0)
MONOS PCT: 11.7 % (ref 3.0–12.0)
Monocytes Absolute: 0.8 10*3/uL (ref 0.1–1.0)
NEUTROS ABS: 3.4 10*3/uL (ref 1.4–7.7)
Neutrophils Relative %: 47.7 % (ref 43.0–77.0)
Platelets: 263 10*3/uL (ref 150.0–400.0)
RBC: 4.47 Mil/uL (ref 4.22–5.81)
RDW: 14 % (ref 11.5–15.5)
WBC: 7.1 10*3/uL (ref 4.0–10.5)

## 2017-09-15 LAB — COMPREHENSIVE METABOLIC PANEL
ALBUMIN: 4.1 g/dL (ref 3.5–5.2)
ALK PHOS: 88 U/L (ref 39–117)
ALT: 16 U/L (ref 0–53)
AST: 14 U/L (ref 0–37)
BUN: 18 mg/dL (ref 6–23)
CO2: 28 mEq/L (ref 19–32)
Calcium: 9.6 mg/dL (ref 8.4–10.5)
Chloride: 107 mEq/L (ref 96–112)
Creatinine, Ser: 1.43 mg/dL (ref 0.40–1.50)
GFR: 62.84 mL/min (ref >60.00)
Glucose, Bld: 104 mg/dL — ABNORMAL HIGH (ref 70–99)
Potassium: 4.1 mEq/L (ref 3.5–5.1)
Sodium: 142 mEq/L (ref 135–145)
TOTAL PROTEIN: 7 g/dL (ref 6.0–8.3)
Total Bilirubin: 0.5 mg/dL (ref 0.2–1.2)

## 2017-09-15 LAB — LIPID PANEL
CHOL/HDL RATIO: 4
CHOLESTEROL: 138 mg/dL (ref 0–200)
HDL: 34.8 mg/dL — AB (ref >39.00)
LDL CALC: 84 mg/dL (ref 0–99)
NONHDL: 103.09
TRIGLYCERIDES: 93 mg/dL (ref 0.0–149.0)
VLDL: 18.6 mg/dL (ref 0.0–40.0)

## 2017-09-15 LAB — TSH: TSH: 0.74 u[IU]/mL (ref 0.35–4.50)

## 2017-09-15 LAB — HEMOGLOBIN A1C: Hgb A1c MFr Bld: 6.9 % — ABNORMAL HIGH (ref 4.6–6.5)

## 2017-09-15 LAB — PSA, MEDICARE: PSA: 0 ng/ml — ABNORMAL LOW (ref 0.10–4.00)

## 2017-09-15 NOTE — Progress Notes (Signed)
Pre visit review using our clinic review tool, if applicable. No additional management support is needed unless otherwise documented below in the visit note. 

## 2017-09-15 NOTE — Assessment & Plan Note (Signed)
Will check carotid US

## 2017-09-15 NOTE — Progress Notes (Signed)
Subjective:   Alfred Foster is a 71 y.o. male who presents for Medicare Annual/Subsequent preventive examination.  Review of Systems:  No ROS.  Medicare Wellness Visit. Additional risk factors are reflected in the social history.    Sleep patterns: feels rested on waking, gets up 1 times nightly to void and sleeps 6-7 hours nightly.    Home Safety/Smoke Alarms: Feels safe in home. Smoke alarms in place.  Living environment; residence and Firearm Safety: 1-story house/ trailer, no firearms.,Lives with wife, no needs for DME, good support system Seat Belt Safety/Bike Helmet: Wears seat belt.    Objective:    Vitals: There were no vitals taken for this visit.  There is no height or weight on file to calculate BMI.  Tobacco History  Smoking Status  . Never Smoker  Smokeless Tobacco  . Never Used     Counseling given: Not Answered   Past Medical History:  Diagnosis Date  . Diabetes mellitus   . Hyperlipidemia   . Hypertension   . Prostate cancer [facility]) 2007   Dr Alinda Money   Past Surgical History:  Procedure Laterality Date  . COLONOSCOPY  2004 & 2014   Negative ;[facility]  . INGUINAL HERNIA REPAIR    . PROSTATECTOMY  04/2006   Dr Alinda Money  . UMBILICAL HERNIA REPAIR     Family History  Problem Relation Age of Onset  . Diabetes Father   . Hypertension Father   . Alzheimer's disease Father   . Hypertension Mother   . Alzheimer's disease Mother   . Heart attack Mother 17  . Diabetes Brother   . Heart disease Brother   . Heart attack Maternal Aunt        in 13s  . Stroke Neg Hx    History  Sexual Activity  . Sexual activity: Not on file    Outpatient Encounter Prescriptions as of 09/15/2017  Medication Sig  . amLODipine (NORVASC) 10 MG tablet Take 1 tablet (10 mg total) by mouth daily.  . Ascorbic Acid (VITAMIN C PO) Take by mouth daily.    Marland Kitchen aspirin 81 MG tablet Take 81 mg by mouth daily.  . Cholecalciferol (VITAMIN D3) 1000 UNITS CAPS Take 1,000 Units by  mouth daily.  . COD LIVER OIL PO Take by mouth daily.    . Diclofenac Sodium 1.5 % SOLN Use bid  . folic acid (FOLVITE) 967 MCG tablet Take 800 mcg by mouth daily.   . Lancets (ONETOUCH ULTRASOFT) lancets use as directed  . losartan (COZAAR) 100 MG tablet Take 1 tablet (100 mg total) by mouth daily.  . metoprolol succinate (TOPROL-XL) 50 MG 24 hr tablet Take 1 tablet (50 mg total) by mouth daily. Take with or immediately following a meal.  . niacin 500 MG tablet Take 500 mg by mouth once.  . Omega-3 Fatty Acids (FISH OIL PO) Take 1,000 mg by mouth daily.   . ONE TOUCH ULTRA TEST test strip use as directed  . pravastatin (PRAVACHOL) 40 MG tablet Take 0.5 tablets (20 mg total) by mouth at bedtime.   No facility-administered encounter medications on file as of 09/15/2017.     Activities of Daily Living No flowsheet data found.  Patient Care Team: Binnie Rail, MD as PCP - General (Internal Medicine)   Assessment:    Physical assessment deferred to PCP.  Exercise Activities and Dietary recommendations   Diet (meal preparation, eat out, water intake, caffeinated beverages, dairy products, fruits and vegetables): in general,  a "healthy" diet  , well balanced   Reviewed heart healthy and diabetic diet, discussed reading food labels.  Goals    None     Fall Risk Fall Risk  04/04/2017 03/19/2015 08/28/2013  Falls in the past year? No No No   Depression Screen PHQ 2/9 Scores 04/04/2017 03/19/2015 08/28/2013  PHQ - 2 Score 0 0 0    Cognitive Function       Ad8 score reviewed for issues:  Issues making decisions: no  Less interest in hobbies / activities: no  Repeats questions, stories (family complaining): no  Trouble using ordinary gadgets (microwave, computer, phone):no  Forgets the month or year: no  Mismanaging finances: no  Remembering appts: no  Daily problems with thinking and/or memory: no Ad8 score is= 0  Immunization History  Administered Date(s)  Administered  . Influenza Split 10/12/2011, 08/24/2012  . Influenza Whole 09/25/2007, 09/30/2008, 09/09/2009, 09/07/2010  . Influenza, High Dose Seasonal PF 09/03/2014, 08/18/2016  . Influenza,inj,Quad PF,6+ Mos 09/17/2013, 09/07/2015  . Pneumococcal Conjugate-13 10/28/2014  . Pneumococcal Polysaccharide-23 11/12/2012  . Td 04/21/2010  . Zoster 11/23/2012  . Zoster Recombinat (Shingrix) 06/09/2017, 08/15/2017   Screening Tests Health Maintenance  Topic Date Due  . INFLUENZA VACCINE  07/12/2017  . HEMOGLOBIN A1C  12/08/2017  . OPHTHALMOLOGY EXAM  03/15/2018  . FOOT EXAM  06/13/2018  . TETANUS/TDAP  04/21/2020  . COLONOSCOPY  06/25/2023  . Hepatitis C Screening  Completed  . PNA vac Low Risk Adult  Completed      Plan:  Continue doing brain stimulating activities (puzzles, reading, adult coloring books, staying active) to keep memory sharp.   Continue to eat heart healthy diet (full of fruits, vegetables, whole grains, lean protein, water--limit salt, fat, and sugar intake) and increase physical activity as tolerated.  I have personally reviewed and noted the following in the patient's chart:   . Medical and social history . Use of alcohol, tobacco or illicit drugs  . Current medications and supplements . Functional ability and status . Nutritional status . Physical activity . Advanced directives . List of other physicians . Vitals . Screenings to include cognitive, depression, and falls . Referrals and appointments  In addition, I have reviewed and discussed with patient certain preventive protocols, quality metrics, and best practice recommendations. A written personalized care plan for preventive services as well as general preventive health recommendations were provided to patient.     Michiel Cowboy, RN  09/15/2017   Medical screening examination/treatment/procedure(s) were performed by non-physician practitioner and as supervising physician I was immediately  available for consultation/collaboration. I agree with above. Binnie Rail, MD

## 2017-09-15 NOTE — Telephone Encounter (Signed)
Called and LVM to ask if patient would like to have his AWV today before or after his appointment with Dr. Quay Burow. Requested that the patient call nurse back if he  receives the message.

## 2017-09-16 ENCOUNTER — Encounter: Payer: Self-pay | Admitting: Internal Medicine

## 2017-09-16 NOTE — Assessment & Plan Note (Signed)
No longer sees urology - will check psa annually and if > 0.2 will refer back to urology psa today

## 2017-09-16 NOTE — Assessment & Plan Note (Addendum)
BP Readings from Last 3 Encounters:  09/15/17 (!) 140/92  06/23/17 140/82  06/08/17 (!) 146/92   Has been controlled at home - slightly elevated here Monitor at home - advised goal of < 140/90 Continue current medication cmp today EKG: NSR at 68 bpm, normal EKG, compared to 2013 EKG - no longer has short PR

## 2017-09-16 NOTE — Assessment & Plan Note (Signed)
Check a1c Low sugar / carb diet Stressed regular exercise   

## 2017-09-16 NOTE — Assessment & Plan Note (Signed)
Check lipid panel  Continue daily statin Regular exercise and healthy diet encouraged  

## 2017-09-19 ENCOUNTER — Encounter: Payer: Self-pay | Admitting: Sports Medicine

## 2017-09-19 ENCOUNTER — Ambulatory Visit (INDEPENDENT_AMBULATORY_CARE_PROVIDER_SITE_OTHER): Payer: Managed Care, Other (non HMO) | Admitting: Sports Medicine

## 2017-09-19 DIAGNOSIS — M2041 Other hammer toe(s) (acquired), right foot: Secondary | ICD-10-CM

## 2017-09-19 DIAGNOSIS — M21619 Bunion of unspecified foot: Secondary | ICD-10-CM

## 2017-09-19 DIAGNOSIS — M2042 Other hammer toe(s) (acquired), left foot: Secondary | ICD-10-CM

## 2017-09-19 DIAGNOSIS — B351 Tinea unguium: Secondary | ICD-10-CM

## 2017-09-19 DIAGNOSIS — E1142 Type 2 diabetes mellitus with diabetic polyneuropathy: Secondary | ICD-10-CM | POA: Diagnosis not present

## 2017-09-19 DIAGNOSIS — M2141 Flat foot [pes planus] (acquired), right foot: Secondary | ICD-10-CM

## 2017-09-19 DIAGNOSIS — L84 Corns and callosities: Secondary | ICD-10-CM

## 2017-09-19 DIAGNOSIS — M2142 Flat foot [pes planus] (acquired), left foot: Secondary | ICD-10-CM

## 2017-09-19 NOTE — Progress Notes (Signed)
Subjective: Alfred Foster is a 71 y.o. male patient with history of diabetes who returns to office today complaining of long, painful nails while ambulating in shoes; unable to trim. Patient states that the glucose reading this morning was not recorded, A1c 6.9. Admits that he wants to start the process for diabetic shoes again for this year. No other issues.  Patient Active Problem List   Diagnosis Date Noted  . Right carotid bruit 09/15/2017  . Left shoulder pain 02/17/2016  . Lipoma of skin of abdomen 03/19/2015  . Nonspecific abnormal electrocardiogram (ECG) (EKG) 03/15/2012  . Essential hypertension 10/22/2010  . PROSTATE CANCER, HX OF 01/23/2009  . Diabetes type 2, controlled ([place]) 08/09/2007  . Hyperlipidemia 05/08/2007   Current Outpatient Prescriptions on File Prior to Visit  Medication Sig Dispense Refill  . amLODipine (NORVASC) 10 MG tablet Take 1 tablet (10 mg total) by mouth daily. 90 tablet 3  . Ascorbic Acid (VITAMIN C PO) Take by mouth daily.      Marland Kitchen aspirin 81 MG tablet Take 81 mg by mouth daily.    . Cholecalciferol (VITAMIN D3) 1000 UNITS CAPS Take 1,000 Units by mouth daily.    . COD LIVER OIL PO Take by mouth daily.      . Diclofenac Sodium 1.5 % SOLN Use bid 1 Bottle 0  . folic acid (FOLVITE) 094 MCG tablet Take 800 mcg by mouth daily.     . Lancets (ONETOUCH ULTRASOFT) lancets use as directed 100 each 1  . losartan (COZAAR) 100 MG tablet Take 1 tablet (100 mg total) by mouth daily. 90 tablet 3  . metoprolol succinate (TOPROL-XL) 50 MG 24 hr tablet Take 1 tablet (50 mg total) by mouth daily. Take with or immediately following a meal. 90 tablet 3  . niacin 500 MG tablet Take 500 mg by mouth once.    . Omega-3 Fatty Acids (FISH OIL PO) Take 1,000 mg by mouth daily.     . ONE TOUCH ULTRA TEST test strip use as directed 100 each 1  . pravastatin (PRAVACHOL) 40 MG tablet Take 0.5 tablets (20 mg total) by mouth at bedtime. 45 tablet 3   No current  facility-administered medications on file prior to visit.    Allergies  Allergen Reactions  . Hydrochlorothiazide     SOB, dizzy    Recent Results (from the past 2160 hour(s))  Comprehensive metabolic panel     Status: Abnormal   Collection Time: 09/15/17  3:45 PM  Result Value Ref Range   Sodium 142 135 - 145 mEq/L   Potassium 4.1 3.5 - 5.1 mEq/L   Chloride 107 96 - 112 mEq/L   CO2 28 19 - 32 mEq/L   Glucose, Bld 104 (H) 70 - 99 mg/dL   BUN 18 6 - 23 mg/dL   Creatinine, Ser 1.43 0.40 - 1.50 mg/dL   Total Bilirubin 0.5 0.2 - 1.2 mg/dL   Alkaline Phosphatase 88 39 - 117 U/L   AST 14 0 - 37 U/L   ALT 16 0 - 53 U/L   Total Protein 7.0 6.0 - 8.3 g/dL   Albumin 4.1 3.5 - 5.2 g/dL   Calcium 9.6 8.4 - 10.5 mg/dL   GFR 62.84 >60.00 mL/min  CBC with Differential/Platelet     Status: None   Collection Time: 09/15/17  3:45 PM  Result Value Ref Range   WBC 7.1 4.0 - 10.5 K/uL   RBC 4.47 4.22 - 5.81 Mil/uL   Hemoglobin 14.3 13.0 -  17.0 g/dL   HCT 42.7 39.0 - 52.0 %   MCV 95.6 78.0 - 100.0 fl   MCHC 33.5 30.0 - 36.0 g/dL   RDW 14.0 11.5 - 15.5 %   Platelets 263.0 150.0 - 400.0 K/uL   Neutrophils Relative % 47.7 43.0 - 77.0 %   Lymphocytes Relative 36.8 12.0 - 46.0 %   Monocytes Relative 11.7 3.0 - 12.0 %   Eosinophils Relative 3.2 0.0 - 5.0 %   Basophils Relative 0.6 0.0 - 3.0 %   Neutro Abs 3.4 1.4 - 7.7 K/uL   Lymphs Abs 2.6 0.7 - 4.0 K/uL   Monocytes Absolute 0.8 0.1 - 1.0 K/uL   Eosinophils Absolute 0.2 0.0 - 0.7 K/uL   Basophils Absolute 0.0 0.0 - 0.1 K/uL  Hemoglobin A1c     Status: Abnormal   Collection Time: 09/15/17  3:45 PM  Result Value Ref Range   Hgb A1c MFr Bld 6.9 (H) 4.6 - 6.5 %    Comment: Glycemic Control Guidelines for People with Diabetes:Non Diabetic:  <6%Goal of Therapy: <7%Additional Action Suggested:  >8%   Lipid panel     Status: Abnormal   Collection Time: 09/15/17  3:45 PM  Result Value Ref Range   Cholesterol 138 0 - 200 mg/dL    Comment: ATP III  Classification       Desirable:  < 200 mg/dL               Borderline High:  200 - 239 mg/dL          High:  > = 240 mg/dL   Triglycerides 93.0 0.0 - 149.0 mg/dL    Comment: Normal:  <150 mg/dLBorderline High:  150 - 199 mg/dL   HDL 34.80 (L) >39.00 mg/dL   VLDL 18.6 0.0 - 40.0 mg/dL   LDL Cholesterol 84 0 - 99 mg/dL   Total CHOL/HDL Ratio 4     Comment:                Men          Women1/2 Average Risk     3.4          3.3Average Risk          5.0          4.42X Average Risk          9.6          7.13X Average Risk          15.0          11.0                       NonHDL 103.09     Comment: NOTE:  Non-HDL goal should be 30 mg/dL higher than patient's LDL goal (i.e. LDL goal of < 70 mg/dL, would have non-HDL goal of < 100 mg/dL)  TSH     Status: None   Collection Time: 09/15/17  3:45 PM  Result Value Ref Range   TSH 0.74 0.35 - 4.50 uIU/mL  PSA, Medicare     Status: Abnormal   Collection Time: 09/15/17  3:45 PM  Result Value Ref Range   PSA 0.00 Repeated and verified X2. (L) 0.10 - 4.00 ng/ml    Comment: Test performed using Access Hybritech PSA Assay, a parmagnetic partical, chemiluminecent immunoassay.    Objective: General: Patient is awake, alert, and oriented x 3 and in no acute distress.  Integument: Skin is warm, dry  and supple bilateral. Nails are tender, long, thickened and dystrophic with subungual debris, consistent with onychomycosis, 1-5 bilateral. No signs of infection. No open lesions. + callus minimal at medial hallux present bilateral. Remaining integument unremarkable.  Vasculature:  Dorsalis Pedis pulse 2/4 bilateral. Posterior Tibial pulse  1/4 bilateral. Capillary fill time <3 sec 1-5 bilateral. Positive hair growth to the level of the digits.Temperature gradient within normal limits. No varicosities present bilateral. No edema present bilateral.   Neurology: The patient has intact sensation measured with a 5.07/10g Semmes Weinstein Monofilament at all pedal sites  bilateral . Vibratory sensation diminished bilateral with tuning fork. No Babinski sign present bilateral.   Musculoskeletal: Asymptomatic bunion and planus foot type pedal deformities noted bilateral. Muscular strength 5/5 in all lower extremity muscular groups bilateral without pain on range of motion. No tenderness with calf compression bilateral.  Assessment and Plan: Problem List Items Addressed This Visit    None    Visit Diagnoses    Dermatophytosis of nail    -  Primary   Diabetic polyneuropathy associated with type 2 diabetes mellitus (HCC)       Pes planus of both feet       Hammer toes of both feet       Bunion       Foot callus         -Examined patient. -Discussed and educated patient on diabetic foot care, especially with  regards to the vascular, neurological and musculoskeletal systems.  -Stressed the importance of good glycemic control and the detriment of not  controlling glucose levels in relation to the foot. -Mechanically debrided all nails 1-5 bilateral using sterile nail nipper and filed with dremel without incident and smoothed callus areas with bur. -Continue with diabetic shoes. Safe step diabetic shoe order form was completed; office to contact primary care for approval / certification;  Office to arrange shoe fitting and dispensing for a new pair of diabetic shoes for this year. -Answered all patient questions -Patient to return in 3 months for at risk foot care -Patient advised to call the office if any problems or questions arise in the meantime.   Landis Martins, DPM

## 2017-10-05 ENCOUNTER — Ambulatory Visit: Payer: Medicare Other | Admitting: Internal Medicine

## 2017-10-16 ENCOUNTER — Encounter (HOSPITAL_COMMUNITY): Payer: Managed Care, Other (non HMO)

## 2017-10-19 ENCOUNTER — Ambulatory Visit (HOSPITAL_COMMUNITY)
Admission: RE | Admit: 2017-10-19 | Discharge: 2017-10-19 | Disposition: A | Discharge status: Home / Self Care | Payer: Managed Care, Other (non HMO) | Source: Ambulatory Visit | Attending: Cardiology | Admitting: Cardiology

## 2017-10-19 ENCOUNTER — Encounter: Payer: Self-pay | Admitting: *Deleted

## 2017-10-19 DIAGNOSIS — I6523 Occlusion and stenosis of bilateral carotid arteries: Secondary | ICD-10-CM | POA: Insufficient documentation

## 2017-10-19 DIAGNOSIS — R0989 Other specified symptoms and signs involving the circulatory and respiratory systems: Secondary | ICD-10-CM | POA: Insufficient documentation

## 2017-10-21 ENCOUNTER — Encounter: Payer: Self-pay | Admitting: Internal Medicine

## 2017-10-21 DIAGNOSIS — I739 Peripheral vascular disease, unspecified: Secondary | ICD-10-CM

## 2017-10-21 DIAGNOSIS — I779 Disorder of arteries and arterioles, unspecified: Secondary | ICD-10-CM | POA: Insufficient documentation

## 2017-10-23 ENCOUNTER — Other Ambulatory Visit: Payer: Self-pay | Admitting: Internal Medicine

## 2017-10-23 MED ORDER — ROSUVASTATIN CALCIUM 10 MG PO TABS: 10.0000 mg | ORAL_TABLET | Freq: Every day | ORAL | 3 refills | Status: DC

## 2017-12-26 ENCOUNTER — Ambulatory Visit: Payer: Managed Care, Other (non HMO) | Admitting: Sports Medicine

## 2017-12-26 ENCOUNTER — Encounter: Payer: Self-pay | Admitting: Sports Medicine

## 2017-12-26 ENCOUNTER — Ambulatory Visit: Payer: Medicare Other | Admitting: Sports Medicine

## 2017-12-26 ENCOUNTER — Ambulatory Visit: Payer: Medicare Other

## 2017-12-26 DIAGNOSIS — E1142 Type 2 diabetes mellitus with diabetic polyneuropathy: Secondary | ICD-10-CM | POA: Diagnosis not present

## 2017-12-26 DIAGNOSIS — M2041 Other hammer toe(s) (acquired), right foot: Secondary | ICD-10-CM

## 2017-12-26 DIAGNOSIS — M2042 Other hammer toe(s) (acquired), left foot: Secondary | ICD-10-CM

## 2017-12-26 DIAGNOSIS — M21619 Bunion of unspecified foot: Secondary | ICD-10-CM

## 2017-12-26 DIAGNOSIS — B351 Tinea unguium: Secondary | ICD-10-CM | POA: Diagnosis not present

## 2017-12-26 DIAGNOSIS — M2141 Flat foot [pes planus] (acquired), right foot: Secondary | ICD-10-CM

## 2017-12-26 DIAGNOSIS — M2142 Flat foot [pes planus] (acquired), left foot: Secondary | ICD-10-CM

## 2017-12-26 NOTE — Progress Notes (Signed)
Subjective: Alfred Foster is a 72 y.o. male patient with history of diabetes who returns to office today complaining of long, painful nails while ambulating in shoes; unable to trim. Patient states that the glucose reading this morning was not recorded, A1c 6.9. No other issues.  Patient Active Problem List   Diagnosis Date Noted  . Carotid artery disease ([place]) 10/21/2017  . Right carotid bruit 09/15/2017  . Left shoulder pain 02/17/2016  . Lipoma of skin of abdomen 03/19/2015  . Nonspecific abnormal electrocardiogram (ECG) (EKG) 03/15/2012  . Essential hypertension 10/22/2010  . PROSTATE CANCER, HX OF 01/23/2009  . Diabetes type 2, controlled ([place]) 08/09/2007  . Hyperlipidemia 05/08/2007   Current Outpatient Medications on File Prior to Visit  Medication Sig Dispense Refill  . amLODipine (NORVASC) 10 MG tablet Take 1 tablet (10 mg total) by mouth daily. 90 tablet 3  . Ascorbic Acid (VITAMIN C PO) Take by mouth daily.      Marland Kitchen aspirin 81 MG tablet Take 81 mg by mouth daily.    . Cholecalciferol (VITAMIN D3) 1000 UNITS CAPS Take 1,000 Units by mouth daily.    . COD LIVER OIL PO Take by mouth daily.      . Diclofenac Sodium 1.5 % SOLN Use bid 1 Bottle 0  . folic acid (FOLVITE) 585 MCG tablet Take 800 mcg by mouth daily.     . Lancets (ONETOUCH ULTRASOFT) lancets use as directed 100 each 1  . losartan (COZAAR) 100 MG tablet Take 1 tablet (100 mg total) by mouth daily. 90 tablet 3  . metoprolol succinate (TOPROL-XL) 50 MG 24 hr tablet Take 1 tablet (50 mg total) by mouth daily. Take with or immediately following a meal. 90 tablet 3  . niacin 500 MG tablet Take 500 mg by mouth once.    . Omega-3 Fatty Acids (FISH OIL PO) Take 1,000 mg by mouth daily.     . ONE TOUCH ULTRA TEST test strip use as directed 100 each 1  . rosuvastatin (CRESTOR) 10 MG tablet Take 1 tablet (10 mg total) daily by mouth. 90 tablet 3   No current facility-administered medications on file prior to visit.     Allergies  Allergen Reactions  . Hydrochlorothiazide     SOB, dizzy    No results found for this or any previous visit (from the past 2160 hour(s)).  Objective: General: Patient is awake, alert, and oriented x 3 and in no acute distress.  Integument: Skin is warm, dry and supple bilateral. Nails are tender, long, thickened and dystrophic with subungual debris, consistent with onychomycosis, 1-5 bilateral. No signs of infection. No open lesions. + callus minimal at medial hallux present bilateral. Remaining integument unremarkable.  Vasculature:  Dorsalis Pedis pulse 2/4 bilateral. Posterior Tibial pulse  1/4 bilateral. Capillary fill time <3 sec 1-5 bilateral. Positive hair growth to the level of the digits.Temperature gradient within normal limits. No varicosities present bilateral. No edema present bilateral.   Neurology: The patient has intact sensation measured with a 5.07/10g Semmes Weinstein Monofilament at all pedal sites bilateral . Vibratory sensation diminished bilateral with tuning fork. No Babinski sign present bilateral.   Musculoskeletal: Asymptomatic bunion and planus foot type pedal deformities noted bilateral. Muscular strength 5/5 in all lower extremity muscular groups bilateral without pain on range of motion. No tenderness with calf compression bilateral.  Assessment and Plan: Problem List Items Addressed This Visit    None    Visit Diagnoses    Dermatophytosis of nail    -  Primary   Diabetic polyneuropathy associated with type 2 diabetes mellitus (HCC)       Pes planus of both feet       Hammer toes of both feet       Bunion         -Examined patient. -Discussed and educated patient on diabetic foot care, especially with  regards to the vascular, neurological and musculoskeletal systems.  -Stressed the importance of good glycemic control and the detriment of not  controlling glucose levels in relation to the foot. -Mechanically debrided all nails 1-5  bilateral using sterile nail nipper and filed with dremel without incident.  -Safe step diabetic shoe order form was completed; office to contact primary care for approval / certification;  Office to arrange shoe fitting and dispensing. -Continue with diabetic shoes. -Answered all patient questions -Patient to return in 3 months for at risk foot care -Patient advised to call the office if any problems or questions arise in the meantime.   Landis Martins, DPM

## 2018-01-29 ENCOUNTER — Encounter: Payer: Self-pay | Admitting: Emergency Medicine

## 2018-01-29 ENCOUNTER — Ambulatory Visit (INDEPENDENT_AMBULATORY_CARE_PROVIDER_SITE_OTHER)
Admission: RE | Admit: 2018-01-29 | Discharge: 2018-01-29 | Disposition: A | Discharge status: Home / Self Care | Payer: Medicare Other | Source: Ambulatory Visit | Attending: Internal Medicine | Admitting: Internal Medicine

## 2018-01-29 ENCOUNTER — Encounter: Payer: Self-pay | Admitting: Internal Medicine

## 2018-01-29 ENCOUNTER — Telehealth: Payer: Self-pay | Admitting: Internal Medicine

## 2018-01-29 ENCOUNTER — Ambulatory Visit (INDEPENDENT_AMBULATORY_CARE_PROVIDER_SITE_OTHER): Payer: Medicare Other | Admitting: Internal Medicine

## 2018-01-29 VITALS — BP 130/82 | HR 96 | Temp 98.8°F | Resp 16 | Wt 223.0 lb

## 2018-01-29 DIAGNOSIS — R059 Cough, unspecified: Secondary | ICD-10-CM

## 2018-01-29 DIAGNOSIS — R0602 Shortness of breath: Secondary | ICD-10-CM | POA: Diagnosis not present

## 2018-01-29 DIAGNOSIS — J069 Acute upper respiratory infection, unspecified: Secondary | ICD-10-CM | POA: Diagnosis not present

## 2018-01-29 DIAGNOSIS — R05 Cough: Secondary | ICD-10-CM

## 2018-01-29 MED ORDER — AZITHROMYCIN 250 MG PO TABS: ORAL_TABLET | ORAL | 0 refills | Status: DC

## 2018-01-29 NOTE — Assessment & Plan Note (Signed)
Symptoms consistent with upper respiratory tract infection Given duration will prescribe Z-Pak Chest x-ray to rule out atypical pneumonia Continue over-the-counter cold medications Continue increased rest and fluids Call if no improvement

## 2018-01-29 NOTE — Telephone Encounter (Signed)
Noted  

## 2018-01-29 NOTE — Progress Notes (Signed)
Subjective:    Patient ID: Alfred Foster, male    DOB: 08-15-1946, 72 y.o.   MRN: [id number]  HPI He is here for an acute visit for cold symptoms.  His symptoms started a couple of weeks ago.   He is experiencing chills, mild diaphoresis, subjective fevers, nasal congestion with occasional nosebleeds that are mild, sinus pressure, hoarseness, cough that is primarily dry, shortness of breath and some mild dizziness.  He denies any wheezing, chest pain or GI symptoms.  Individuals at work and his son have been sick.  He has tried taking robitussin, cough drops and fluids.   Medications and allergies reviewed with patient and updated if appropriate.  Patient Active Problem List   Diagnosis Date Noted  . Carotid artery disease ([place]) 10/21/2017  . Right carotid bruit 09/15/2017  . Left shoulder pain 02/17/2016  . Lipoma of skin of abdomen 03/19/2015  . Nonspecific abnormal electrocardiogram (ECG) (EKG) 03/15/2012  . Essential hypertension 10/22/2010  . PROSTATE CANCER, HX OF 01/23/2009  . Diabetes type 2, controlled ([place]) 08/09/2007  . Hyperlipidemia 05/08/2007    Current Outpatient Medications on File Prior to Visit  Medication Sig Dispense Refill  . amLODipine (NORVASC) 10 MG tablet Take 1 tablet (10 mg total) by mouth daily. 90 tablet 3  . Ascorbic Acid (VITAMIN C PO) Take by mouth daily.      Marland Kitchen aspirin 81 MG tablet Take 81 mg by mouth daily.    . Cholecalciferol (VITAMIN D3) 1000 UNITS CAPS Take 1,000 Units by mouth daily.    . COD LIVER OIL PO Take by mouth daily.      . Diclofenac Sodium 1.5 % SOLN Use bid 1 Bottle 0  . folic acid (FOLVITE) 440 MCG tablet Take 800 mcg by mouth daily.     . Lancets (ONETOUCH ULTRASOFT) lancets use as directed 100 each 1  . losartan (COZAAR) 100 MG tablet Take 1 tablet (100 mg total) by mouth daily. 90 tablet 3  . metoprolol succinate (TOPROL-XL) 50 MG 24 hr tablet Take 1 tablet (50 mg total) by mouth daily. Take with or immediately  following a meal. 90 tablet 3  . niacin 500 MG tablet Take 500 mg by mouth once.    . Omega-3 Fatty Acids (FISH OIL PO) Take 1,000 mg by mouth daily.     . ONE TOUCH ULTRA TEST test strip use as directed 100 each 1  . rosuvastatin (CRESTOR) 10 MG tablet Take 1 tablet (10 mg total) daily by mouth. 90 tablet 3   No current facility-administered medications on file prior to visit.     Past Medical History:  Diagnosis Date  . Diabetes mellitus   . Hyperlipidemia   . Hypertension   . Prostate cancer [facility]) 2007   Dr Alinda Money    Past Surgical History:  Procedure Laterality Date  . COLONOSCOPY  2004 & 2014   Negative ;[facility]  . INGUINAL HERNIA REPAIR    . PROSTATECTOMY  04/2006   Dr Alinda Money  . UMBILICAL HERNIA REPAIR      Social History   Socioeconomic History  . Marital status: Married    Spouse name: None  . Number of children: None  . Years of education: None  . Highest education level: None  Social Needs  . Financial resource strain: None  . Food insecurity - worry: None  . Food insecurity - inability: None  . Transportation needs - medical: None  . Transportation needs - non-medical: None  Occupational History  . None  Tobacco Use  . Smoking status: Never Smoker  . Smokeless tobacco: Never Used  Substance and Sexual Activity  . Alcohol use: No  . Drug use: No  . Sexual activity: None  Other Topics Concern  . None  Social History Narrative  . None    Family History  Problem Relation Age of Onset  . Diabetes Father   . Hypertension Father   . Alzheimer's disease Father   . Hypertension Mother   . Alzheimer's disease Mother   . Heart attack Mother 51  . Diabetes Brother   . Heart disease Brother   . Heart attack Maternal Aunt        in 21s  . Stroke Neg Hx     Review of Systems  Constitutional: Positive for chills, diaphoresis and fever (subjective).  HENT: Positive for congestion, nosebleeds (mild), sinus pressure and voice change. Negative for ear  pain, sinus pain and sore throat.   Respiratory: Positive for cough (dry) and shortness of breath (mild). Negative for wheezing.   Cardiovascular: Negative for chest pain.  Gastrointestinal: Negative for diarrhea and nausea.  Neurological: Positive for dizziness. Negative for headaches.       Objective:   Vitals:   01/29/18 1603  BP: 130/82  Pulse: 96  Resp: 16  Temp: 98.8 F (37.1 C)  SpO2: 98%   Filed Weights   01/29/18 1603  Weight: 223 lb (101.2 kg)   Body mass index is 28.63 kg/m.  Wt Readings from Last 3 Encounters:  01/29/18 223 lb (101.2 kg)  09/15/17 228 lb (103.4 kg)  06/23/17 229 lb (103.9 kg)     Physical Exam GENERAL APPEARANCE: Appears stated age, well appearing, NAD EYES: conjunctiva clear, no icterus HEENT: bilateral tympanic membranes and ear canals normal, oropharynx with mild erythema, no thyromegaly, trachea midline, no cervical or supraclavicular lymphadenopathy LUNGS: Clear to auscultation without wheeze or crackles, unlabored breathing, good air entry bilaterally CARDIOVASCULAR: Normal S1,S2 without murmurs, no edema SKIN: warm, dry        Assessment & Plan:   See Problem List for Assessment and Plan of chronic medical problems.

## 2018-01-29 NOTE — Assessment & Plan Note (Signed)
Chest x-ray to rule out atypical pneumonia Z-Pak prescribed Continue over-the-counter cold medications Continue increased rest and fluids Call if no improvement

## 2018-01-29 NOTE — Telephone Encounter (Signed)
Triage call returned to pt's wife re: her symptoms.  At the end of the call, she requested to let Dr. Quay Burow know that her husband is coming in at 3:45 PM today, and she would like her to consider doing a chest xray, due to c/o bad cough, weakness, and sleeping a lot.  Stated she usually comes to the appt. with him, but is not well enough to accompany him today.  Advised will send a message to Dr. Quay Burow. Agreed with plan.

## 2018-01-29 NOTE — Patient Instructions (Signed)
Have a chest x-ray today and we will call you with the results.   Take the antibiotic as prescribed.  Continue the over the counter cold medications.    Call if no improvement

## 2018-03-01 ENCOUNTER — Ambulatory Visit: Payer: Medicare Other | Admitting: Orthotics

## 2018-03-01 DIAGNOSIS — M2142 Flat foot [pes planus] (acquired), left foot: Principal | ICD-10-CM

## 2018-03-01 DIAGNOSIS — M2141 Flat foot [pes planus] (acquired), right foot: Secondary | ICD-10-CM

## 2018-03-01 NOTE — Progress Notes (Signed)
Needs xw shoe

## 2018-03-08 ENCOUNTER — Ambulatory Visit (INDEPENDENT_AMBULATORY_CARE_PROVIDER_SITE_OTHER): Payer: Medicare Other | Admitting: Orthotics

## 2018-03-08 DIAGNOSIS — E1142 Type 2 diabetes mellitus with diabetic polyneuropathy: Secondary | ICD-10-CM

## 2018-03-08 DIAGNOSIS — M2042 Other hammer toe(s) (acquired), left foot: Secondary | ICD-10-CM

## 2018-03-08 DIAGNOSIS — M2041 Other hammer toe(s) (acquired), right foot: Secondary | ICD-10-CM | POA: Diagnosis not present

## 2018-03-08 DIAGNOSIS — C61 Malignant neoplasm of prostate: Secondary | ICD-10-CM | POA: Diagnosis not present

## 2018-03-08 NOTE — Progress Notes (Signed)

## 2018-03-13 ENCOUNTER — Other Ambulatory Visit: Payer: Medicare Other | Admitting: Orthotics

## 2018-03-15 ENCOUNTER — Other Ambulatory Visit: Payer: Medicare Other | Admitting: Orthotics

## 2018-03-15 DIAGNOSIS — E119 Type 2 diabetes mellitus without complications: Secondary | ICD-10-CM | POA: Diagnosis not present

## 2018-03-15 DIAGNOSIS — H2513 Age-related nuclear cataract, bilateral: Secondary | ICD-10-CM | POA: Diagnosis not present

## 2018-03-15 DIAGNOSIS — H5203 Hypermetropia, bilateral: Secondary | ICD-10-CM | POA: Diagnosis not present

## 2018-03-15 DIAGNOSIS — H25013 Cortical age-related cataract, bilateral: Secondary | ICD-10-CM | POA: Diagnosis not present

## 2018-03-15 LAB — HM DIABETES EYE EXAM

## 2018-03-20 ENCOUNTER — Encounter: Payer: Self-pay | Admitting: Internal Medicine

## 2018-04-03 ENCOUNTER — Encounter: Payer: Self-pay | Admitting: Sports Medicine

## 2018-04-03 ENCOUNTER — Ambulatory Visit: Payer: Medicare Other | Admitting: Sports Medicine

## 2018-04-03 DIAGNOSIS — M2141 Flat foot [pes planus] (acquired), right foot: Secondary | ICD-10-CM

## 2018-04-03 DIAGNOSIS — B351 Tinea unguium: Secondary | ICD-10-CM

## 2018-04-03 DIAGNOSIS — E1142 Type 2 diabetes mellitus with diabetic polyneuropathy: Secondary | ICD-10-CM | POA: Diagnosis not present

## 2018-04-03 DIAGNOSIS — M2142 Flat foot [pes planus] (acquired), left foot: Secondary | ICD-10-CM

## 2018-04-03 DIAGNOSIS — M2042 Other hammer toe(s) (acquired), left foot: Secondary | ICD-10-CM

## 2018-04-03 DIAGNOSIS — M2041 Other hammer toe(s) (acquired), right foot: Secondary | ICD-10-CM

## 2018-04-03 NOTE — Progress Notes (Signed)
Subjective: Alfred Foster is a 72 y.o. male patient with history of diabetes who returns to office today complaining of long, painful nails while ambulating in shoes; unable to trim. Patient states that the glucose reading this morning was not recorded. No other issues.  Patient Active Problem List   Diagnosis Date Noted  . Upper respiratory tract infection 01/29/2018  . Cough 01/29/2018  . Carotid artery disease ([place]) 10/21/2017  . Right carotid bruit 09/15/2017  . Left shoulder pain 02/17/2016  . Lipoma of skin of abdomen 03/19/2015  . Nonspecific abnormal electrocardiogram (ECG) (EKG) 03/15/2012  . Essential hypertension 10/22/2010  . PROSTATE CANCER, HX OF 01/23/2009  . Diabetes type 2, controlled ([place]) 08/09/2007  . Hyperlipidemia 05/08/2007   Current Outpatient Medications on File Prior to Visit  Medication Sig Dispense Refill  . amLODipine (NORVASC) 10 MG tablet Take 1 tablet (10 mg total) by mouth daily. 90 tablet 3  . Ascorbic Acid (VITAMIN C PO) Take by mouth daily.      Marland Kitchen aspirin 81 MG tablet Take 81 mg by mouth daily.    Marland Kitchen azithromycin (ZITHROMAX) 250 MG tablet Take two tabs the first day and then one tab daily for four days 6 tablet 0  . Cholecalciferol (VITAMIN D3) 1000 UNITS CAPS Take 1,000 Units by mouth daily.    . COD LIVER OIL PO Take by mouth daily.      . Diclofenac Sodium 1.5 % SOLN Use bid 1 Bottle 0  . folic acid (FOLVITE) 161 MCG tablet Take 800 mcg by mouth daily.     . Lancets (ONETOUCH ULTRASOFT) lancets use as directed 100 each 1  . losartan (COZAAR) 100 MG tablet Take 1 tablet (100 mg total) by mouth daily. 90 tablet 3  . metoprolol succinate (TOPROL-XL) 50 MG 24 hr tablet Take 1 tablet (50 mg total) by mouth daily. Take with or immediately following a meal. 90 tablet 3  . niacin 500 MG tablet Take 500 mg by mouth once.    . Omega-3 Fatty Acids (FISH OIL PO) Take 1,000 mg by mouth daily.     . ONE TOUCH ULTRA TEST test strip use as directed 100 each 1   . rosuvastatin (CRESTOR) 10 MG tablet Take 1 tablet (10 mg total) daily by mouth. 90 tablet 3   No current facility-administered medications on file prior to visit.    Allergies  Allergen Reactions  . Hydrochlorothiazide     SOB, dizzy    Recent Results (from the past 2160 hour(s))  HM DIABETES EYE EXAM     Status: None   Collection Time: 03/15/18 12:00 AM  Result Value Ref Range   HM Diabetic Eye Exam No Retinopathy No Retinopathy    Objective: General: Patient is awake, alert, and oriented x 3 and in no acute distress.  Integument: Skin is warm, dry and supple bilateral. Nails are tender, long, thickened and dystrophic with subungual debris, consistent with onychomycosis, 1-5 bilateral. No signs of infection. No open lesions. + callus minimal at medial hallux present bilateral. Remaining integument unremarkable.  Vasculature:  Dorsalis Pedis pulse 2/4 bilateral. Posterior Tibial pulse  1/4 bilateral. Capillary fill time <3 sec 1-5 bilateral. Positive hair growth to the level of the digits.Temperature gradient within normal limits. No varicosities present bilateral. No edema present bilateral.   Neurology: The patient has intact sensation measured with a 5.07/10g Semmes Weinstein Monofilament at all pedal sites bilateral . Vibratory sensation diminished bilateral with tuning fork. No Babinski sign present  bilateral.   Musculoskeletal: Asymptomatic bunion and planus foot type pedal deformities noted bilateral. Muscular strength 5/5 in all lower extremity muscular groups bilateral without pain on range of motion. No tenderness with calf compression bilateral.  Assessment and Plan: Problem List Items Addressed This Visit    None    Visit Diagnoses    Dermatophytosis of nail    -  Primary   Diabetic polyneuropathy associated with type 2 diabetes mellitus (HCC)       Hammer toes of both feet       Pes planus of both feet         -Examined patient. -Discussed and educated patient  on diabetic foot care, especially with  regards to the vascular, neurological and musculoskeletal systems.  -Stressed the importance of good glycemic control and the detriment of not controlling glucose levels in relation to the foot. -Mechanically debrided all nails 1-5 bilateral using sterile nail nipper and filed with dremel without incident.  -Continue with diabetic shoes. -Answered all patient questions -Patient to return in 3 months for at risk foot care -Patient advised to call the office if any problems or questions arise in the meantime.   Landis Martins, DPM

## 2018-04-30 ENCOUNTER — Telehealth: Payer: Self-pay | Admitting: Internal Medicine

## 2018-04-30 MED ORDER — ROSUVASTATIN CALCIUM 10 MG PO TABS: 10.0000 mg | ORAL_TABLET | Freq: Every day | ORAL | 0 refills | Status: DC

## 2018-04-30 MED ORDER — METOPROLOL SUCCINATE ER 50 MG PO TB24: 50.0000 mg | ORAL_TABLET | Freq: Every day | ORAL | 0 refills | Status: DC

## 2018-04-30 NOTE — Telephone Encounter (Signed)
Last refill on Rosuvastatin 10 mg; 10/23/17; # 90; RF x 3 Last RF on Metoprolol Succinate 50 mg; 06/23/17; #90; RF x 3 Last office visit 09/15/17 Last acute OV 01/29/18  Refilled meds per protocol; # 30; no refills until schedules 6 mo. F/u. (from 09/2017)

## 2018-04-30 NOTE — Telephone Encounter (Signed)
Copied from [place] [id number]. Topic: Quick Communication - Rx Refill/Question >> Apr 30, 2018 10:31 AM Cleaster Corin, NT wrote: Medication:rosuvastatin (CRESTOR) 10 MG tablet [[id number]]  metoprolol succinate (TOPROL-XL) 50 MG 24 hr tablet [[id number]]   Has the patient contacted their pharmacy? yes (Agent: If no, request that the patient contact the pharmacy for the refill.) (Agent: If yes, when and what did the pharmacy advise?)  Preferred Pharmacy (with phone number or street name): [facility] [phone] - [place], [place] [address] [place] [address] Phone: [phone] Fax: [phone]    Agent: Please be advised that RX refills may take up to 3 business days. We ask that you follow-up with your pharmacy.

## 2018-06-05 ENCOUNTER — Other Ambulatory Visit: Payer: Self-pay | Admitting: Internal Medicine

## 2018-06-06 ENCOUNTER — Other Ambulatory Visit: Payer: Self-pay | Admitting: Internal Medicine

## 2018-06-06 ENCOUNTER — Telehealth: Payer: Self-pay | Admitting: Internal Medicine

## 2018-06-06 DIAGNOSIS — I10 Essential (primary) hypertension: Secondary | ICD-10-CM

## 2018-06-06 NOTE — Telephone Encounter (Signed)
Copied from [place]. Topic: Quick Communication - Rx Refill/Question >> Jun 06, 2018  9:36 AM Selinda Flavin B, NT wrote: Medication: losartan (COZAAR) 100 MG tablet   Has the patient contacted their pharmacy? Yes.   (Agent: If no, request that the patient contact the pharmacy for the refill.) (Agent: If yes, when and what did the pharmacy advise?)  Preferred Pharmacy (with phone number or street name): [facility] [place], [address] [place]: Please be advised that RX refills may take up to 3 business days. We ask that you follow-up with your pharmacy.

## 2018-06-07 NOTE — Telephone Encounter (Signed)
Left VM re: refill request; refill at requested pharmacy

## 2018-07-03 ENCOUNTER — Encounter: Payer: Self-pay | Admitting: Sports Medicine

## 2018-07-03 ENCOUNTER — Ambulatory Visit: Payer: Medicare Other | Admitting: Sports Medicine

## 2018-07-03 DIAGNOSIS — B351 Tinea unguium: Secondary | ICD-10-CM | POA: Diagnosis not present

## 2018-07-03 DIAGNOSIS — E1142 Type 2 diabetes mellitus with diabetic polyneuropathy: Secondary | ICD-10-CM | POA: Diagnosis not present

## 2018-07-03 NOTE — Progress Notes (Signed)
Subjective: Alfred Foster is a 72 y.o. male patient with history of diabetes who returns to office today complaining of long, painful nails while ambulating in shoes; unable to trim. Patient states that the glucose reading this morning was not recorded. No other issues.  Patient Active Problem List   Diagnosis Date Noted  . Upper respiratory tract infection 01/29/2018  . Cough 01/29/2018  . Carotid artery disease ([place]) 10/21/2017  . Right carotid bruit 09/15/2017  . Left shoulder pain 02/17/2016  . Lipoma of skin of abdomen 03/19/2015  . Nonspecific abnormal electrocardiogram (ECG) (EKG) 03/15/2012  . Essential hypertension 10/22/2010  . PROSTATE CANCER, HX OF 01/23/2009  . Diabetes type 2, controlled ([place]) 08/09/2007  . Hyperlipidemia 05/08/2007   Current Outpatient Medications on File Prior to Visit  Medication Sig Dispense Refill  . amLODipine (NORVASC) 10 MG tablet Take 1 tablet (10 mg total) by mouth daily. 90 tablet 3  . Ascorbic Acid (VITAMIN C PO) Take by mouth daily.      Marland Kitchen aspirin 81 MG tablet Take 81 mg by mouth daily.    Marland Kitchen azithromycin (ZITHROMAX) 250 MG tablet Take two tabs the first day and then one tab daily for four days 6 tablet 0  . Cholecalciferol (VITAMIN D3) 1000 UNITS CAPS Take 1,000 Units by mouth daily.    . COD LIVER OIL PO Take by mouth daily.      . Diclofenac Sodium 1.5 % SOLN Use bid 1 Bottle 0  . folic acid (FOLVITE) 979 MCG tablet Take 800 mcg by mouth daily.     . Lancets (ONETOUCH ULTRASOFT) lancets use as directed 100 each 1  . losartan (COZAAR) 100 MG tablet TAKE 1 TABLET BY MOUTH EVERY DAY 90 tablet 3  . metoprolol succinate (TOPROL-XL) 50 MG 24 hr tablet TAKE 1 TABLET BY MOUTH EVERY DAY(TAKE WITH OR IMMEDIATELY AFTER A MEAL) 30 tablet 3  . niacin 500 MG tablet Take 500 mg by mouth once.    . Omega-3 Fatty Acids (FISH OIL PO) Take 1,000 mg by mouth daily.     . ONE TOUCH ULTRA TEST test strip use as directed 100 each 1  . rosuvastatin (CRESTOR)  10 MG tablet TAKE 1 TABLET BY MOUTH EVERY DAY 30 tablet 3   No current facility-administered medications on file prior to visit.    Allergies  Allergen Reactions  . Hydrochlorothiazide     SOB, dizzy    No results found for this or any previous visit (from the past 2160 hour(s)).  Objective: General: Patient is awake, alert, and oriented x 3 and in no acute distress.  Integument: Skin is warm, dry and supple bilateral. Nails are tender, long, thickened and dystrophic with subungual debris, consistent with onychomycosis, 1-5 bilateral. No signs of infection. No open lesions. + callus minimal at medial hallux present bilateral. Remaining integument unremarkable.  Vasculature:  Dorsalis Pedis pulse 2/4 bilateral. Posterior Tibial pulse  1/4 bilateral. Capillary fill time <3 sec 1-5 bilateral. Positive hair growth to the level of the digits.Temperature gradient within normal limits. No varicosities present bilateral. No edema present bilateral.   Neurology: The patient has intact sensation measured with a 5.07/10g Semmes Weinstein Monofilament at all pedal sites bilateral . Vibratory sensation diminished bilateral with tuning fork. No Babinski sign present bilateral.   Musculoskeletal: Asymptomatic bunion and planus foot type pedal deformities noted bilateral. Muscular strength 5/5 in all lower extremity muscular groups bilateral without pain on range of motion. No tenderness with calf compression  bilateral.  Assessment and Plan: Problem List Items Addressed This Visit    None    Visit Diagnoses    Dermatophytosis of nail    -  Primary   Diabetic polyneuropathy associated with type 2 diabetes mellitus ([place])         -Examined patient. -Discussed and educated patient on diabetic foot care, especially with  regards to the vascular, neurological and musculoskeletal systems.  -Stressed the importance of good glycemic control and the detriment of not controlling glucose levels in relation to  the foot. -Mechanically debrided all nails 1-5 bilateral using sterile nail nipper and filed with dremel without incident.  -Continue with diabetic shoes. -Answered all patient questions -Patient to return in 3 months for at risk foot care -Patient advised to call the office if any problems or questions arise in the meantime.   Landis Martins, DPM

## 2018-09-11 ENCOUNTER — Emergency Department (HOSPITAL_COMMUNITY)
Admission: EM | Admit: 2018-09-11 | Discharge: 2018-09-12 | Disposition: A | Discharge status: Home / Self Care | Payer: Medicare Other | Attending: Emergency Medicine | Admitting: Emergency Medicine

## 2018-09-11 ENCOUNTER — Other Ambulatory Visit: Payer: Self-pay

## 2018-09-11 ENCOUNTER — Encounter (HOSPITAL_COMMUNITY): Payer: Self-pay | Admitting: Emergency Medicine

## 2018-09-11 ENCOUNTER — Ambulatory Visit: Payer: Self-pay

## 2018-09-11 DIAGNOSIS — Z7982 Long term (current) use of aspirin: Secondary | ICD-10-CM | POA: Insufficient documentation

## 2018-09-11 DIAGNOSIS — I10 Essential (primary) hypertension: Secondary | ICD-10-CM | POA: Diagnosis not present

## 2018-09-11 DIAGNOSIS — Z79899 Other long term (current) drug therapy: Secondary | ICD-10-CM | POA: Insufficient documentation

## 2018-09-11 DIAGNOSIS — M7989 Other specified soft tissue disorders: Secondary | ICD-10-CM | POA: Insufficient documentation

## 2018-09-11 DIAGNOSIS — E119 Type 2 diabetes mellitus without complications: Secondary | ICD-10-CM | POA: Insufficient documentation

## 2018-09-11 DIAGNOSIS — I82401 Acute embolism and thrombosis of unspecified deep veins of right lower extremity: Secondary | ICD-10-CM | POA: Diagnosis not present

## 2018-09-11 DIAGNOSIS — R2241 Localized swelling, mass and lump, right lower limb: Secondary | ICD-10-CM | POA: Diagnosis present

## 2018-09-11 LAB — I-STAT CHEM 8, ED
BUN: 17 mg/dL (ref 8–23)
Calcium, Ion: 1.12 mmol/L — ABNORMAL LOW (ref 1.15–1.40)
Chloride: 106 mmol/L (ref 98–111)
Creatinine, Ser: 1.6 mg/dL — ABNORMAL HIGH (ref 0.61–1.24)
Glucose, Bld: 119 mg/dL — ABNORMAL HIGH (ref 70–99)
HEMATOCRIT: 41 % (ref 39.0–52.0)
HEMOGLOBIN: 13.9 g/dL (ref 13.0–17.0)
POTASSIUM: 4 mmol/L (ref 3.5–5.1)
Sodium: 141 mmol/L (ref 135–145)
TCO2: 26 mmol/L (ref 22–32)

## 2018-09-11 MED ORDER — ENOXAPARIN SODIUM 120 MG/0.8ML ~~LOC~~ SOLN
1.0000 mg/kg | Freq: Once | SUBCUTANEOUS | Status: AC
  Administered 2018-09-12: 105 mg via SUBCUTANEOUS
  Filled 2018-09-11: qty 0.69

## 2018-09-11 MED ORDER — CEPHALEXIN 500 MG PO CAPS: 500.0000 mg | ORAL_CAPSULE | Freq: Four times a day (QID) | ORAL | 0 refills | Status: DC

## 2018-09-11 NOTE — Discharge Instructions (Addendum)
You were seen today for lower extremity swelling.  You need to return to [facility] tomorrow for outpatient ultrasound imaging to rule out a DVT.  You are given 1 dose of blood thinner today.  You were also started on a short course of antibiotics to cover for possible cellulitis.  If you develop shortness of breath, chest pain, fevers, you need to be reevaluated immediately.

## 2018-09-11 NOTE — ED Provider Notes (Signed)
[facility] DEPT Provider Note   CSN: [id number] Arrival date & time: 09/11/18  1918     History   Chief Complaint Chief Complaint  Patient presents with  . Leg Swelling    HPI Alfred Foster is a 72 y.o. male.  HPI  This is a 72 year old male with a history of diabetes, hypertension, hyperlipidemia, prostate cancer who presents with right leg swelling.  Patient reports over the last 2 to 3 days he has noted increased right lower extremity swelling.  It is isolated.  Has not noted any fevers.  Denies any significant pain.  No history of blood clots.  Denies chest pain or shortness of breath.  He is not on any blood thinners.  No recent hospitalization, surgeries, prolonged travel.  Past Medical History:  Diagnosis Date  . Diabetes mellitus   . Hyperlipidemia   . Hypertension   . Prostate cancer [facility]) 2007   Dr Alinda Money    Patient Active Problem List   Diagnosis Date Noted  . Upper respiratory tract infection 01/29/2018  . Cough 01/29/2018  . Carotid artery disease ([place]) 10/21/2017  . Right carotid bruit 09/15/2017  . Left shoulder pain 02/17/2016  . Lipoma of skin of abdomen 03/19/2015  . Nonspecific abnormal electrocardiogram (ECG) (EKG) 03/15/2012  . Essential hypertension 10/22/2010  . PROSTATE CANCER, HX OF 01/23/2009  . Diabetes type 2, controlled ([place]) 08/09/2007  . Hyperlipidemia 05/08/2007    Past Surgical History:  Procedure Laterality Date  . COLONOSCOPY  2004 & 2014   Negative ;[facility]  . INGUINAL HERNIA REPAIR    . PROSTATECTOMY  04/2006   Dr Alinda Money  . UMBILICAL HERNIA REPAIR          Home Medications    Prior to Admission medications   Medication Sig Start Date End Date Taking? Authorizing Provider  amLODipine (NORVASC) 10 MG tablet Take 1 tablet (10 mg total) by mouth daily. 06/23/17   Binnie Rail, MD  Ascorbic Acid (VITAMIN C PO) Take by mouth daily.      [provider]  aspirin 81 MG tablet  Take 81 mg by mouth daily.    [provider]  azithromycin (ZITHROMAX) 250 MG tablet Take two tabs the first day and then one tab daily for four days 01/29/18   Binnie Rail, MD  cephALEXin (KEFLEX) 500 MG capsule Take 1 capsule (500 mg total) by mouth 4 (four) times daily. 09/11/18   Kody Brandl, Barbette Hair, MD  Cholecalciferol (VITAMIN D3) 1000 UNITS CAPS Take 1,000 Units by mouth daily.    [provider]  COD LIVER OIL PO Take by mouth daily.      [provider]  Diclofenac Sodium 1.5 % SOLN Use bid 02/17/16   Binnie Rail, MD  folic acid (FOLVITE) 619 MCG tablet Take 800 mcg by mouth daily.     [provider]  Lancets Glory Rosebush ULTRASOFT) lancets use as directed 11/09/12   Hendricks Limes, MD  losartan (COZAAR) 100 MG tablet TAKE 1 TABLET BY MOUTH EVERY DAY 06/06/18   Binnie Rail, MD  metoprolol succinate (TOPROL-XL) 50 MG 24 hr tablet TAKE 1 TABLET BY MOUTH EVERY DAY(TAKE WITH OR IMMEDIATELY AFTER A MEAL) 06/06/18   Binnie Rail, MD  niacin 500 MG tablet Take 500 mg by mouth once.    [provider]  Omega-3 Fatty Acids (FISH OIL PO) Take 1,000 mg by mouth daily.     [provider]  ONE TOUCH ULTRA TEST test strip use as directed 11/09/12   Hendricks Limes, MD  rosuvastatin (CRESTOR) 10 MG tablet TAKE 1 TABLET BY MOUTH EVERY DAY 06/06/18   Binnie Rail, MD    Family History Family History  Problem Relation Age of Onset  . Diabetes Father   . Hypertension Father   . Alzheimer's disease Father   . Hypertension Mother   . Alzheimer's disease Mother   . Heart attack Mother 62  . Diabetes Brother   . Heart disease Brother   . Heart attack Maternal Aunt        in 45s  . Stroke Neg Hx     Social History Social History   Tobacco Use  . Smoking status: Never Smoker  . Smokeless tobacco: Never Used  Substance Use Topics  . Alcohol use: No  . Drug use: No     Allergies   Hydrochlorothiazide   Review of  Systems Review of Systems  Constitutional: Negative for fever.  Respiratory: Negative for shortness of breath.   Cardiovascular: Positive for leg swelling. Negative for chest pain.  Gastrointestinal: Negative for abdominal pain, nausea and vomiting.  Genitourinary: Negative for dysuria.  Skin: Positive for color change. Negative for wound.  All other systems reviewed and are negative.    Physical Exam Updated Vital Signs BP (!) 162/86 (BP Location: Right Arm)   Pulse 78   Temp 98.3 F (36.8 C) (Oral)   Resp 18   Ht 1.854 m (6\' 1" )   Wt 103.4 kg   SpO2 100%   BMI 30.08 kg/m   Physical Exam  Constitutional: He is oriented to person, place, and time. He appears well-developed and well-nourished.  Nontoxic-appearing  HENT:  Head: Normocephalic and atraumatic.  Eyes: Pupils are equal, round, and reactive to light.  Neck: Neck supple.  Cardiovascular: Normal rate, regular rhythm and normal heart sounds.  No murmur heard. Pulmonary/Chest: Effort normal and breath sounds normal. No respiratory distress. He has no wheezes.  Abdominal: Soft. Bowel sounds are normal. There is no tenderness. There is no rebound.  Musculoskeletal: He exhibits edema.  Patient with asymmetric swelling of the right lower extremity up to the knee, slight erythema and warmth, no significant calf tenderness, no obvious skin tears or abrasions, 2+ DP pulse  Neurological: He is alert and oriented to person, place, and time.  Skin: Skin is warm and dry.  Psychiatric: He has a normal mood and affect.  Nursing note and vitals reviewed.    ED Treatments / Results  Labs (all labs ordered are listed, but only abnormal results are displayed) Labs Reviewed  I-STAT CHEM 8, ED - Abnormal; Notable for the following components:      Result Value   Creatinine, Ser 1.60 (*)    Glucose, Bld 119 (*)    Calcium, Ion 1.12 (*)    All other components within normal limits    EKG None  Radiology No results  found.  Procedures Procedures (including critical care time)  Medications Ordered in ED Medications  enoxaparin (LOVENOX) injection 105 mg (has no administration in time range)     Initial Impression / Assessment and Plan / ED Course  I have reviewed the triage vital signs and the nursing notes.  Pertinent labs & imaging results that were available during my care of the patient were reviewed by me and considered in my medical decision making (see chart for details).     Patient presents with isolated right lower  extremity swelling.  He is overall nontoxic-appearing.  Denies chest pain or shortness of breath.  He has asymmetric swelling on exam.  Denies any risk factors for PE.  There is slight erythema but no overt cellulitis or obvious infection.  Would be concern for possible DVT.  Unfortunately, do not have ultrasound imaging at this time.  I have reviewed his prior labs.  No history of thrombocytopenia.  Chem-8 with normal hemoglobin.  Creatinine of 1.6.  Patient was dosed 1 mg/kg of Lovenox.  He was instructed to return for ultrasound tomorrow.  Additionally, I started him on Keflex to cover for possible early cellulitis.  He is instructed to return if he has any chest pain, shortness of breath, fevers.  After history, exam, and medical workup I feel the patient has been appropriately medically screened and is safe for discharge home. Pertinent diagnoses were discussed with the patient. Patient was given return precautions.   Final Clinical Impressions(s) / ED Diagnoses   Final diagnoses:  Right leg swelling    ED Discharge Orders         Ordered    cephALEXin (KEFLEX) 500 MG capsule  4 times daily     09/11/18 2351    [facility]     09/11/18 2351           Niklaus Mamaril, Barbette Hair, MD 09/12/18 0006

## 2018-09-11 NOTE — Telephone Encounter (Signed)
Patient's wife called with c/o "leg swelling." She says "when he came home from work yesterday evening, his right leg was swollen from the top of the foot up to the knee. I don't know if it was red, painful because he didn't say if it was. He's at work now." I asked about other symptoms, she denies. According to protocol, see PCP within 24 hours, patient's wife says he will not come in tomorrow, because he is working. She says he has an appointment for Friday that was scheduled by the agent. I advised to ask if he wants to change the appointment, and if so, call us back to change it. Otherwise, keep the Friday, 09/14/18 appointment with Dr. Quay Burow, care advice given, she verbalized understanding.   Reason for Disposition . [1] MODERATE leg swelling (e.g., swelling extends up to knees) AND [2] new onset or worsening  Answer Assessment - Initial Assessment Questions 1. ONSET: "When did the swelling start?" (e.g., minutes, hours, days)     Last night 2. LOCATION: "What part of the leg is swollen?"  "Are both legs swollen or just one leg?"     Right leg 3. SEVERITY: "How bad is the swelling?" (e.g., localized; mild, moderate, severe)  - Localized - small area of swelling localized to one leg  - MILD pedal edema - swelling limited to foot and ankle, pitting edema < 1/4 inch (6 mm) deep, rest and elevation eliminate most or all swelling  - MODERATE edema - swelling of lower leg to knee, pitting edema > 1/4 inch (6 mm) deep, rest and elevation only partially reduce swelling  - SEVERE edema - swelling extends above knee, facial or hand swelling present      Moderate 4. REDNESS: "Does the swelling look red or infected?"     I don't know 5. PAIN: "Is the swelling painful to touch?" If so, ask: "How painful is it?"   (Scale 1-10; mild, moderate or severe)     Unsure 6. FEVER: "Do you have a fever?" If so, ask: "What is it, how was it measured, and when did it start?"      No 7. CAUSE: "What do you think is  causing the leg swelling?"     I don't know 8. MEDICAL HISTORY: "Do you have a history of heart failure, kidney disease, liver failure, or cancer?"     No 9. RECURRENT SYMPTOM: "Have you had leg swelling before?" If so, ask: "When was the last time?" "What happened that time?"     No 10. OTHER SYMPTOMS: "Do you have any other symptoms?" (e.g., chest pain, difficulty breathing)     No 11. PREGNANCY: "Is there any chance you are pregnant?" "When was your last menstrual period?"       N/A  Protocols used: LEG SWELLING AND EDEMA-A-AH

## 2018-09-11 NOTE — Telephone Encounter (Addendum)
Pt returning call stating that he noticed the swelling in his right leg last night but experienced a little pain to leg on Friday/Saturday. Pt currently denies any soreness or pain in leg but states that the leg feels tight with walking. Pt states he does feel a little pain with bending or lifting right leg. Pt currently denies any chest pain, SOB or other symptoms at this time. Pt states he has never experienced swelling in his leg previously. Pt advised per protocol to seek care in the ED tonight for symptoms. Pt verbalized understanding.

## 2018-09-11 NOTE — ED Triage Notes (Signed)
Pt from home with c/o right calf pain, redness,and warmth since sunday. Pt states symptoms got worse today. Pt states pain is worse when he sits rather than when he puts weight on his leg. No knee crepitus or pain. Adequate distal pulse

## 2018-09-12 ENCOUNTER — Encounter (HOSPITAL_COMMUNITY): Payer: Self-pay | Admitting: *Deleted

## 2018-09-12 ENCOUNTER — Emergency Department (HOSPITAL_COMMUNITY)
Admission: EM | Admit: 2018-09-12 | Discharge: 2018-09-12 | Disposition: A | Discharge status: Home / Self Care | Payer: Medicare Other | Source: Home / Self Care | Attending: Emergency Medicine | Admitting: Emergency Medicine

## 2018-09-12 ENCOUNTER — Encounter: Payer: Self-pay | Admitting: Internal Medicine

## 2018-09-12 ENCOUNTER — Ambulatory Visit (HOSPITAL_BASED_OUTPATIENT_CLINIC_OR_DEPARTMENT_OTHER)
Admission: RE | Admit: 2018-09-12 | Discharge: 2018-09-12 | Disposition: A | Discharge status: Home / Self Care | Payer: Medicare Other | Source: Ambulatory Visit | Attending: Emergency Medicine | Admitting: Emergency Medicine

## 2018-09-12 ENCOUNTER — Ambulatory Visit (INDEPENDENT_AMBULATORY_CARE_PROVIDER_SITE_OTHER): Payer: Medicare Other | Admitting: Internal Medicine

## 2018-09-12 VITALS — BP 120/68 | HR 101 | Temp 98.7°F | Resp 16 | Ht 73.0 in | Wt 231.0 lb

## 2018-09-12 DIAGNOSIS — I824Y1 Acute embolism and thrombosis of unspecified deep veins of right proximal lower extremity: Secondary | ICD-10-CM

## 2018-09-12 DIAGNOSIS — Z86718 Personal history of other venous thrombosis and embolism: Secondary | ICD-10-CM | POA: Insufficient documentation

## 2018-09-12 DIAGNOSIS — I82509 Chronic embolism and thrombosis of unspecified deep veins of unspecified lower extremity: Secondary | ICD-10-CM | POA: Insufficient documentation

## 2018-09-12 DIAGNOSIS — Z79899 Other long term (current) drug therapy: Secondary | ICD-10-CM | POA: Insufficient documentation

## 2018-09-12 DIAGNOSIS — I10 Essential (primary) hypertension: Secondary | ICD-10-CM | POA: Insufficient documentation

## 2018-09-12 DIAGNOSIS — I824Y3 Acute embolism and thrombosis of unspecified deep veins of proximal lower extremity, bilateral: Secondary | ICD-10-CM | POA: Insufficient documentation

## 2018-09-12 DIAGNOSIS — E119 Type 2 diabetes mellitus without complications: Secondary | ICD-10-CM | POA: Insufficient documentation

## 2018-09-12 DIAGNOSIS — I82401 Acute embolism and thrombosis of unspecified deep veins of right lower extremity: Secondary | ICD-10-CM | POA: Diagnosis not present

## 2018-09-12 DIAGNOSIS — M7989 Other specified soft tissue disorders: Secondary | ICD-10-CM | POA: Diagnosis not present

## 2018-09-12 DIAGNOSIS — I82461 Acute embolism and thrombosis of right calf muscular vein: Secondary | ICD-10-CM | POA: Insufficient documentation

## 2018-09-12 DIAGNOSIS — I82562 Chronic embolism and thrombosis of left calf muscular vein: Secondary | ICD-10-CM | POA: Insufficient documentation

## 2018-09-12 MED ORDER — APIXABAN 5 MG PO TABS: 5.0000 mg | ORAL_TABLET | Freq: Two times a day (BID) | ORAL | 1 refills | Status: DC

## 2018-09-12 MED ORDER — ELIQUIS 5 MG VTE STARTER PACK: ORAL_TABLET | ORAL | 0 refills | Status: DC

## 2018-09-12 MED ORDER — APIXABAN 5 MG PO TABS: 5.0000 mg | ORAL_TABLET | Freq: Two times a day (BID) | ORAL | Status: DC

## 2018-09-12 MED ORDER — APIXABAN 5 MG PO TABS
10.0000 mg | ORAL_TABLET | Freq: Two times a day (BID) | ORAL | Status: DC
  Administered 2018-09-12: 10 mg via ORAL
  Filled 2018-09-12: qty 2

## 2018-09-12 MED ORDER — LOSARTAN POTASSIUM 100 MG PO TABS: 100.0000 mg | ORAL_TABLET | Freq: Every day | ORAL | 3 refills | Status: DC

## 2018-09-12 MED FILL — ELIQUIS STARTER PACK 5 MG T: 5 | 30 days supply | Qty: 74 | Fill #0

## 2018-09-12 MED FILL — CEPHALEXIN 500 MG CAPSULE: 500 | 5 days supply | Qty: 20 | Fill #0

## 2018-09-12 NOTE — Progress Notes (Signed)
Subjective:    Patient ID: Alfred Foster, male    DOB: December 31, 1945, 72 y.o.   MRN: [id number]  HPI The patient is here for an acute visit.   Swelling in leg:  5 days ago he had cramping in his right upper posterior leg. This is unusual for him. 4 days ago he noticed swelling in his left dorsal foot.  The swelling got worse after that and involves his calf.  He denies any pain in the leg.    He denies right leg injury, long trips in car or plane.  His wife states his father had a blood clot in his leg, but he was not aware of that.     09/12/18   [place] of b/l LE: Final Interpretation: Right: Findings consistent with acute deep vein thrombosis involving the right femoral vein, right popliteal vein, right posterior tibial vein, right peroneal vein, right soleal vein, and right gastrocnemius vein. No cystic structure found in the  popliteal fossa. Left: No evidence of common femoral vein obstruction.   Hypertension: He is taking his medication daily. He is compliant with a low sodium diet.  He denies chest pain, palpitations, shortness of breath and regular headaches. He is very active at work.    Medications and allergies reviewed with patient and updated if appropriate.  Patient Active Problem List   Diagnosis Date Noted  . Upper respiratory tract infection 01/29/2018  . Cough 01/29/2018  . Carotid artery disease ([place]) 10/21/2017  . Right carotid bruit 09/15/2017  . Left shoulder pain 02/17/2016  . Lipoma of skin of abdomen 03/19/2015  . Nonspecific abnormal electrocardiogram (ECG) (EKG) 03/15/2012  . Essential hypertension 10/22/2010  . PROSTATE CANCER, HX OF 01/23/2009  . Diabetes type 2, controlled ([place]) 08/09/2007  . Hyperlipidemia 05/08/2007    Current Outpatient Medications on File Prior to Visit  Medication Sig Dispense Refill  . amLODipine (NORVASC) 10 MG tablet Take 1 tablet (10 mg total) by mouth daily. 90 tablet 3  . Ascorbic Acid (VITAMIN C PO) Take by mouth  daily.      Marland Kitchen aspirin 81 MG tablet Take 81 mg by mouth daily.    . cephALEXin (KEFLEX) 500 MG capsule Take 1 capsule (500 mg total) by mouth 4 (four) times daily. 20 capsule 0  . Cholecalciferol (VITAMIN D3) 1000 UNITS CAPS Take 1,000 Units by mouth daily.    . COD LIVER OIL PO Take by mouth daily.      . Diclofenac Sodium 1.5 % SOLN Use bid 1 Bottle 0  . folic acid (FOLVITE) 220 MCG tablet Take 800 mcg by mouth daily.     . Lancets (ONETOUCH ULTRASOFT) lancets use as directed 100 each 1  . metoprolol succinate (TOPROL-XL) 50 MG 24 hr tablet TAKE 1 TABLET BY MOUTH EVERY DAY(TAKE WITH OR IMMEDIATELY AFTER A MEAL) 30 tablet 3  . niacin 500 MG tablet Take 500 mg by mouth once.    . Omega-3 Fatty Acids (FISH OIL PO) Take 1,000 mg by mouth daily.     . ONE TOUCH ULTRA TEST test strip use as directed 100 each 1  . rosuvastatin (CRESTOR) 10 MG tablet TAKE 1 TABLET BY MOUTH EVERY DAY 30 tablet 3   No current facility-administered medications on file prior to visit.     Past Medical History:  Diagnosis Date  . Diabetes mellitus   . Hyperlipidemia   . Hypertension   . Prostate cancer [facility]) 2007   Dr Alinda Money  Past Surgical History:  Procedure Laterality Date  . COLONOSCOPY  2004 & 2014   Negative ;[facility]  . INGUINAL HERNIA REPAIR    . PROSTATECTOMY  04/2006   Dr Alinda Money  . UMBILICAL HERNIA REPAIR      Social History   Socioeconomic History  . Marital status: Married    Spouse name: Not on file  . Number of children: Not on file  . Years of education: Not on file  . Highest education level: Not on file  Occupational History  . Not on file  Social Needs  . Financial resource strain: Not on file  . Food insecurity:    Worry: Not on file    Inability: Not on file  . Transportation needs:    Medical: Not on file    Non-medical: Not on file  Tobacco Use  . Smoking status: Never Smoker  . Smokeless tobacco: Never Used  Substance and Sexual Activity  . Alcohol use: No  .  Drug use: No  . Sexual activity: Not on file  Lifestyle  . Physical activity:    Days per week: Not on file    Minutes per session: Not on file  . Stress: Not on file  Relationships  . Social connections:    Talks on phone: Not on file    Gets together: Not on file    Attends religious service: Not on file    Active member of club or organization: Not on file    Attends meetings of clubs or organizations: Not on file    Relationship status: Not on file  Other Topics Concern  . Not on file  Social History Narrative  . Not on file    Family History  Problem Relation Age of Onset  . Diabetes Father   . Hypertension Father   . Alzheimer's disease Father   . Hypertension Mother   . Alzheimer's disease Mother   . Heart attack Mother 66  . Diabetes Brother   . Heart disease Brother   . Heart attack Maternal Aunt        in 42s  . Stroke Neg Hx     Review of Systems  Constitutional: Negative for chills and fever.  Respiratory: Negative for cough, shortness of breath and wheezing.   Cardiovascular: Positive for leg swelling. Negative for chest pain and palpitations.  Neurological: Negative for light-headedness and headaches.       Objective:   Vitals:   09/12/18 1413  BP: 120/68  Pulse: (!) 101  Resp: 16  Temp: 98.7 F (37.1 C)  SpO2: 98%   BP Readings from Last 3 Encounters:  09/12/18 120/68  09/12/18 (!) 145/113  09/11/18 (!) 162/86   Wt Readings from Last 3 Encounters:  09/12/18 231 lb (104.8 kg)  09/11/18 228 lb (103.4 kg)  01/29/18 223 lb (101.2 kg)   Body mass index is 30.48 kg/m.   Physical Exam  Constitutional: He appears well-developed and well-nourished. No distress.  HENT:  Head: Normocephalic and atraumatic.  Cardiovascular: Normal rate and regular rhythm.  No murmur heard. Pulmonary/Chest: Effort normal. No respiratory distress. He has no wheezes. He has no rales.  Musculoskeletal: He exhibits edema (Right calf with significant swelling,  and minimal swelling posterior distal right upper leg). He exhibits no tenderness (in right leg).  Neurological: No sensory deficit.  Skin: Skin is warm and dry. No rash noted. He is not diaphoretic. No erythema.  Assessment & Plan:    See Problem List for Assessment and Plan of chronic medical problems.

## 2018-09-12 NOTE — ED Notes (Signed)
Dr. Darl Householder @ bedside.

## 2018-09-12 NOTE — Patient Instructions (Addendum)
Medications reviewed and updated.  Changes include :   none  Your prescription(s) have been submitted to your pharmacy. Please take as directed and contact our office if you believe you are having problem(s) with the medication(s).    Please followup in 3 months     Deep Vein Thrombosis Deep vein thrombosis (DVT) is a condition in which a blood clot forms in a deep vein, such as a lower leg, thigh, or arm vein. A clot is blood that has thickened into a gel or solid. This condition is dangerous. It can lead to serious and even life-threatening complications if the clot travels to the lungs and causes a blockage (pulmonary embolism). It can also damage veins in the leg. This can result in leg pain, swelling, discoloration, and sores (post-thrombotic syndrome). What are the causes? This condition may be caused by:  A slowdown of blood flow.  Damage to a vein.  A condition that makes blood clot more easily.  What increases the risk? The following factors may make you more likely to develop this condition:  Being overweight.  Being elderly, especially over age 22.  Sitting or lying down for more than four hours.  Lack of physical activity (sedentary lifestyle).  Being pregnant, giving birth, or having recently given birth.  Taking medicines that contain estrogen.  Smoking.  A history of any of the following: ? Blood clots or blood clotting disease. ? Peripheral vascular disease. ? Inflammatory bowel disease. ? Cancer. ? Heart disease. ? Genetic conditions that affect how blood clots. ? Neurological diseases that affect the legs (leg paresis). ? Injury. ? Major or lengthy surgery. ? A central line placed inside a large vein.  What are the signs or symptoms? Symptoms of this condition include:  Swelling, pain, or tenderness in an arm or leg.  Warmth, redness, or discoloration in an arm or leg.  If the clot is in your leg, symptoms may be more noticeable or worse  when you stand or walk. Some people do not have any symptoms. How is this diagnosed? This condition is diagnosed with:  A medical history.  A physical exam.  Tests, such as: ? Blood tests. These are done to see how your blood clots. ? Imaging tests. These are done to check for clots. Tests may include:  Ultrasound.  CT scan.  MRI.  X-ray.  Venogram. For this test, X-rays are taken after a dye is injected into a vein.  How is this treated? Treatment for this condition depends on the cause, your risk for bleeding or developing more clots, and any medical conditions you have. Treatment may include:  Taking blood thinners (also called anticoagulants). These medicines may be taken by mouth, injected under the skin, or injected through an IV tube (catheter). These medicines prevent clots from forming.  Injecting medicine that dissolves blood clots into the affected vein (catheter-directed thrombolysis).  Having surgery. Surgery may be done to: ? Remove the clot. ? Place a filter in a large vein to catch blood clots before they reach the lungs.  Some treatments may be continued for up to six months. Follow these instructions at home: If you are taking an oral blood thinner:  Take the medicine exactly as told by your health care provider. Some blood thinners need to be taken at the same time every day. Do not skip a dose.  Ask your health care provider about what foods and drugs interact with the medicine.  Ask about possible side effects. General  instructions  Blood thinners can cause easy bruising and difficulty stopping bleeding. Because of this, if you are taking or were given a blood thinner: ? Hold pressure over cuts for longer than usual. ? Tell your dentist and other health care providers that you are taking blood thinners before having any procedures that can cause bleeding. ? Avoid contact sports.  Take over-the-counter and prescription medicines only as told by  your health care provider.  Return to your normal activities as told by your health care provider. Ask your health care provider what activities are safe for you.  Wear compression stockings if recommended by your health care provider.  Keep all follow-up visits as told by your health care provider. This is important. How is this prevented? To lower your risk of developing this condition again:  For 30 or more minutes every day, do an activity that: ? Involves moving your arms and legs. ? Increases your heart rate.  When traveling for longer than four hours: ? Exercise your arms and legs every hour. ? Drink plenty of water. ? Avoid drinking alcohol.  Avoid sitting or lying for a long time without moving your legs.  Stay a healthy weight.  If you are a woman who is older than age 36, avoid unnecessary use of medicines that contain estrogen.  Do not use any products that contain nicotine or tobacco, such as cigarettes and e-cigarettes. This is especially important if you take estrogen medicines. If you need help quitting, ask your health care provider.  Contact a health care provider if:  You miss a dose of your blood thinner.  You have nausea, vomiting, or diarrhea that lasts for more than one day.  Your menstrual period is heavier than usual.  You have unusual bruising. Get help right away if:  You have new or increased pain, swelling, or redness in an arm or leg.  You have numbness or tingling in an arm or leg.  You have shortness of breath.  You have chest pain.  You have a rapid or irregular heartbeat.  You feel light-headed or dizzy.  You cough up blood.  There is blood in your vomit, stool, or urine.  You have a serious fall or accident, or you hit your head.  You have a severe headache or confusion.  You have a cut that will not stop bleeding. These symptoms may represent a serious problem that is an emergency. Do not wait to see if the symptoms will  go away. Get medical help right away. Call your local emergency services (911 in the [place] Do not drive yourself to the hospital. Summary  DVT is a condition in which a blood clot forms in a deep vein, such as a lower leg, thigh, or arm vein.  Symptoms can include swelling, warmth, pain, and redness in your leg or arm.  Treatment may include taking blood thinners, injecting medicine that dissolves blood clots,wearing compression stockings, or surgery.  If you are prescribed blood thinners, take them exactly as told. This information is not intended to replace advice given to you by your health care provider. Make sure you discuss any questions you have with your health care provider. Document Released: 11/28/2005 Document Revised: 12/31/2016 Document Reviewed: 12/31/2016 Elsevier Interactive Patient Education  2018 [employer].

## 2018-09-12 NOTE — Discharge Instructions (Addendum)
Take Eliquis as prescribed.   Take keflex as prescribed   See your doctor in a week   Repeat US of your leg in a month to make sure the blood clots resolved   Return to ER if you have worse leg swelling or redness, fever, severe pain, trouble breathing     Information on my medicine - ELIQUIS (apixaban)  This medication education was reviewed with me or my healthcare representative as part of my discharge preparation.    WHY WAS ELIQUIS PRESCRIBED FOR YOU? Eliquis was prescribed to treat blood clots that may have been found in the veins of your legs (deep vein thrombosis) or in your lungs (pulmonary embolism) and to reduce the risk of them occurring again.  WHAT DO YOU NEED TO KNOW ABOUT ELIQUIS ? The starting dose is 10 mg (two 5 mg tablets) taken TWICE daily for the FIRST SEVEN (7) DAYS, then on (enter date)  09/19/18  the dose is reduced to ONE 5 mg tablet taken TWICE daily.  Eliquis may be taken with or without food.   Try to take the dose about the same time in the morning and in the evening. If you have difficulty swallowing the tablet whole please discuss with your pharmacist how to take the medication safely.  Take Eliquis exactly as prescribed and DO NOT stop taking Eliquis without talking to the doctor who prescribed the medication.  Stopping may increase your risk of developing a new blood clot.  Refill your prescription before you run out.  After discharge, you should have regular check-up appointments with your healthcare provider that is prescribing your Eliquis.    WHAT DO YOU DO IF YOU MISS A DOSE? If a dose of ELIQUIS is not taken at the scheduled time, take it as soon as possible on the same day and twice-daily administration should be resumed. The dose should not be doubled to make up for a missed dose.  IMPORTANT SAFETY INFORMATION A possible side effect of Eliquis is bleeding. You should call your healthcare provider right away if you experience any of  the following: Bleeding from an injury or your nose that does not stop. Unusual colored urine (red or dark brown) or unusual colored stools (red or black). Unusual bruising for unknown reasons. A serious fall or if you hit your head (even if there is no bleeding).  Some medicines may interact with Eliquis and might increase your risk of bleeding or clotting while on Eliquis. To help avoid this, consult your healthcare provider or pharmacist prior to using any new prescription or non-prescription medications, including herbals, vitamins, non-steroidal anti-inflammatory drugs (NSAIDs) and supplements.  This website has more information on Eliquis (apixaban): http://www.eliquis.com/eliquis/home

## 2018-09-12 NOTE — Assessment & Plan Note (Signed)
BP well controlled Current regimen effective and well tolerated Continue current medications at current doses  

## 2018-09-12 NOTE — ED Triage Notes (Signed)
Pt in from vascular after positive DVT study to his right leg, here for further treatment

## 2018-09-12 NOTE — ED Notes (Signed)
Patient given discharge instructions and verbalized understanding.  Patient stable to discharge at this time.  Patient is alert and oriented to baseline.  No distressed noted at this time.  All belongings taken with the patient at discharge.   

## 2018-09-12 NOTE — Telephone Encounter (Signed)
Just FYI. Pt was seen in the ED for this.

## 2018-09-12 NOTE — Progress Notes (Signed)
Right lower extremity venous duplex has been completed. There is evidence of acute deep vein thrombosis involving the femoral, popliteal, intramuscular gastrocnemius, soleal, posterior tibial, and peroneal veins of the right lower extremity. Results were given Raquel Sarna RN in triage.   09/12/18 8:12 AM Alfred Foster RVT

## 2018-09-12 NOTE — ED Provider Notes (Signed)
[facility] EMERGENCY DEPARTMENT Provider Note   CSN: [id number] Arrival date & time: 09/12/18  0809     History   Chief Complaint Chief Complaint  Patient presents with  . Leg Swelling    HPI Alfred Foster is a 72 y.o. male hx of DM, HL, HTN, here presented with positive DVT study.  Was seen last night for right leg pain and swelling.  He was given a dose of Lovenox and scheduled for outpatient DVT study.  Outpatient ultrasound showed DVT involving the femoral, popliteal, gastrocnemius, soleus, posterior tibial and peroneal veins.  Patient denies any chest pain or shortness of breath.  His Creatinine last night was 1.6 and was prescribed keflex as well.   The history is provided by the patient.    Past Medical History:  Diagnosis Date  . Diabetes mellitus   . Hyperlipidemia   . Hypertension   . Prostate cancer [facility]) 2007   Dr Alinda Money    Patient Active Problem List   Diagnosis Date Noted  . Upper respiratory tract infection 01/29/2018  . Cough 01/29/2018  . Carotid artery disease ([place]) 10/21/2017  . Right carotid bruit 09/15/2017  . Left shoulder pain 02/17/2016  . Lipoma of skin of abdomen 03/19/2015  . Nonspecific abnormal electrocardiogram (ECG) (EKG) 03/15/2012  . Essential hypertension 10/22/2010  . PROSTATE CANCER, HX OF 01/23/2009  . Diabetes type 2, controlled ([place]) 08/09/2007  . Hyperlipidemia 05/08/2007    Past Surgical History:  Procedure Laterality Date  . COLONOSCOPY  2004 & 2014   Negative ;[facility]  . INGUINAL HERNIA REPAIR    . PROSTATECTOMY  04/2006   Dr Alinda Money  . UMBILICAL HERNIA REPAIR          Home Medications    Prior to Admission medications   Medication Sig Start Date End Date Taking? Authorizing Provider  amLODipine (NORVASC) 10 MG tablet Take 1 tablet (10 mg total) by mouth daily. 06/23/17  Yes Burns, Claudina Lick, MD  Ascorbic Acid (VITAMIN C PO) Take by mouth daily.     Yes [provider]  aspirin 81 MG  tablet Take 81 mg by mouth daily.   Yes [provider]  cephALEXin (KEFLEX) 500 MG capsule Take 1 capsule (500 mg total) by mouth 4 (four) times daily. 09/11/18  Yes Horton, Barbette Hair, MD  Cholecalciferol (VITAMIN D3) 1000 UNITS CAPS Take 1,000 Units by mouth daily.   Yes [provider]  COD LIVER OIL PO Take by mouth daily.     Yes [provider]  ELIQUIS STARTER PACK (ELIQUIS STARTER PACK) 5 MG TABS Take by mouth. Take as directed on package: start with two-5mg  tablets twice daily for 7 days. On day 8, switch to one-5mg  tablet twice daily.   Yes [provider]  folic acid (FOLVITE) 035 MCG tablet Take 800 mcg by mouth daily.    Yes [provider]  losartan (COZAAR) 100 MG tablet TAKE 1 TABLET BY MOUTH EVERY DAY 06/06/18  Yes Burns, Claudina Lick, MD  metoprolol succinate (TOPROL-XL) 50 MG 24 hr tablet TAKE 1 TABLET BY MOUTH EVERY DAY(TAKE WITH OR IMMEDIATELY AFTER A MEAL) 06/06/18  Yes Burns, Claudina Lick, MD  niacin 500 MG tablet Take 500 mg by mouth once.   Yes [provider]  Omega-3 Fatty Acids (FISH OIL PO) Take 1,000 mg by mouth daily.    Yes [provider]  rosuvastatin (CRESTOR) 10 MG tablet TAKE 1 TABLET BY MOUTH EVERY DAY  06/06/18  Yes Binnie Rail, MD  Diclofenac Sodium 1.5 % SOLN Use bid Patient not taking: Reported on 09/12/2018 02/17/16   Binnie Rail, MD  ELIQUIS STARTER PACK (ELIQUIS STARTER PACK) 5 MG TABS Take as directed on package: start with two-5mg  tablets twice daily for 7 days. On day 8, switch to one-5mg  tablet twice daily. 09/12/18   Drenda Freeze, MD  Lancets [facility] ULTRASOFT) lancets use as directed 11/09/12   Hendricks Limes, MD  ONE TOUCH ULTRA TEST test strip use as directed 11/09/12   Hendricks Limes, MD    Family History Family History  Problem Relation Age of Onset  . Diabetes Father   . Hypertension Father   . Alzheimer's disease Father   . Hypertension Mother   . Alzheimer's disease  Mother   . Heart attack Mother 72  . Diabetes Brother   . Heart disease Brother   . Heart attack Maternal Aunt        in 59s  . Stroke Neg Hx     Social History Social History   Tobacco Use  . Smoking status: Never Smoker  . Smokeless tobacco: Never Used  Substance Use Topics  . Alcohol use: No  . Drug use: No     Allergies   Hydrochlorothiazide   Review of Systems Review of Systems  Musculoskeletal:       R leg pain   All other systems reviewed and are negative.    Physical Exam Updated Vital Signs BP 133/89   Pulse 77   Temp 98.7 F (37.1 C) (Rectal)   Resp 18   SpO2 100%   Physical Exam  Constitutional: He appears well-developed.  HENT:  Head: Normocephalic.  Mouth/Throat: Oropharynx is clear and moist.  Eyes: Pupils are equal, round, and reactive to light. Conjunctivae and EOM are normal.  Neck: Normal range of motion. Neck supple.  Cardiovascular: Normal rate, regular rhythm and normal heart sounds.  Pulmonary/Chest: Effort normal and breath sounds normal. No stridor. No respiratory distress.  Abdominal: Soft. Bowel sounds are normal. He exhibits no distension. There is no tenderness.  Musculoskeletal:  R leg 2+ edema, ? Venous stasis changes vs early cellulitis. 2+ pulses   Neurological: He is alert.  Skin: Skin is warm. Capillary refill takes less than 2 seconds. There is erythema.  Psychiatric: He has a normal mood and affect.  Nursing note and vitals reviewed.    ED Treatments / Results  Labs (all labs ordered are listed, but only abnormal results are displayed) Labs Reviewed - No data to display  EKG None  Radiology No results found.  Procedures Procedures (including critical care time)  Medications Ordered in ED Medications  apixaban (ELIQUIS) tablet 10 mg (10 mg Oral Given 09/12/18 1019)    Followed by  apixaban (ELIQUIS) tablet 5 mg (has no administration in time range)     Initial Impression / Assessment and Plan / ED  Course  I have reviewed the triage vital signs and the nursing notes.  Pertinent labs & imaging results that were available during my care of the patient were reviewed by me and considered in my medical decision making (see chart for details).    Alfred Foster is a 72 y.o. male here with + DVT on [place]. No chest pain or SOB to suggest PE. Hemodynamically stable, neurovascular intact R lower extremity. I talked to pharmacy who recommend eliquis starter pack. Also filled his keflex prescription. Told him that he needs  repeat US in a month and gave return precautions    Final Clinical Impressions(s) / ED Diagnoses   Final diagnoses:  None    ED Discharge Orders         Ordered    ELIQUIS STARTER PACK [facility] STARTER PACK) 5 MG TABS     09/12/18 0931           Drenda Freeze, MD 09/12/18 1113

## 2018-09-14 ENCOUNTER — Ambulatory Visit: Payer: Medicare Other | Admitting: Internal Medicine

## 2018-09-17 ENCOUNTER — Telehealth: Payer: Self-pay

## 2018-09-17 NOTE — Telephone Encounter (Signed)
Copied from [place] [id number]. Topic: General - Other >> Sep 17, 2018  9:32 AM Alfred Foster wrote: Reason for CRM:  Patient's note to his employer was written to return to work on Mon. 09/17/18 but he cannot return to work today. One of his legs is still swollen to the point that he can't get into his shoe.  The note will need to be written again and they would like to know what they should do about his swollen leg and if that's normal.

## 2018-09-17 NOTE — Telephone Encounter (Signed)
Ok to revise note.  The swelling is to be expected with the blood clot - hopefully this will go down, but it may take a while.

## 2018-09-17 NOTE — Telephone Encounter (Signed)
Letter written and put up front for pick up. Wife is aware.

## 2018-09-18 NOTE — Patient Instructions (Addendum)
Get a compression sock for your right leg - put this on first thing in the morning and take off before gong to bed.   Tests ordered today. Your results will be released to [place] (or called to you) after review, usually within 72hours after test completion. If any changes need to be made, you will be notified at that same time.  All other Health Maintenance issues reviewed.   All recommended immunizations and age-appropriate screenings are up-to-date or discussed.  Flu immunization administered today.    Medications reviewed and updated.  Changes include :   Increasing your metoprolol 75 mg daily  Your prescription(s) have been submitted to your pharmacy. Please take as directed and contact our office if you believe you are having problem(s) with the medication(s).   Please followup in January as scheduled    Health Maintenance, Male A healthy lifestyle and preventive care is important for your health and wellness. Ask your health care provider about what schedule of regular examinations is right for you. What should I know about weight and diet? Eat a Healthy Diet  Eat plenty of vegetables, fruits, whole grains, low-fat dairy products, and lean protein.  Do not eat a lot of foods high in solid fats, added sugars, or salt.  Maintain a Healthy Weight Regular exercise can help you achieve or maintain a healthy weight. You should:  Do at least 150 minutes of exercise each week. The exercise should increase your heart rate and make you sweat (moderate-intensity exercise).  Do strength-training exercises at least twice a week.  Watch Your Levels of Cholesterol and Blood Lipids  Have your blood tested for lipids and cholesterol every 5 years starting at 71 years of age. If you are at high risk for heart disease, you should start having your blood tested when you are 71 years old. You may need to have your cholesterol levels checked more often if: ? Your lipid or cholesterol levels are  high. ? You are older than 71 years of age. ? You are at high risk for heart disease.  What should I know about cancer screening? Many types of cancers can be detected early and may often be prevented. Lung Cancer  You should be screened every year for lung cancer if: ? You are a current smoker who has smoked for at least 30 years. ? You are a former smoker who has quit within the past 15 years.  Talk to your health care provider about your screening options, when you should start screening, and how often you should be screened.  Colorectal Cancer  Routine colorectal cancer screening usually begins at 71 years of age and should be repeated every 5-10 years until you are 71 years old. You may need to be screened more often if early forms of precancerous polyps or small growths are found. Your health care provider may recommend screening at an earlier age if you have risk factors for colon cancer.  Your health care provider may recommend using home test kits to check for hidden blood in the stool.  A small camera at the end of a tube can be used to examine your colon (sigmoidoscopy or colonoscopy). This checks for the earliest forms of colorectal cancer.  Prostate and Testicular Cancer  Depending on your age and overall health, your health care provider may do certain tests to screen for prostate and testicular cancer.  Talk to your health care provider about any symptoms or concerns you have about testicular or prostate  cancer.  Skin Cancer  Check your skin from head to toe regularly.  Tell your health care provider about any new moles or changes in moles, especially if: ? There is a change in a mole's size, shape, or color. ? You have a mole that is larger than a pencil eraser.  Always use sunscreen. Apply sunscreen liberally and repeat throughout the day.  Protect yourself by wearing long sleeves, pants, a wide-brimmed hat, and sunglasses when outside.  What should I know  about heart disease, diabetes, and high blood pressure?  If you are 53-8 years of age, have your blood pressure checked every 3-5 years. If you are 87 years of age or older, have your blood pressure checked every year. You should have your blood pressure measured twice-once when you are at a hospital or clinic, and once when you are not at a hospital or clinic. Record the average of the two measurements. To check your blood pressure when you are not at a hospital or clinic, you can use: ? An automated blood pressure machine at a pharmacy. ? A home blood pressure monitor.  Talk to your health care provider about your target blood pressure.  If you are between 30-80 years old, ask your health care provider if you should take aspirin to prevent heart disease.  Have regular diabetes screenings by checking your fasting blood sugar level. ? If you are at a normal weight and have a low risk for diabetes, have this test once every three years after the age of 33. ? If you are overweight and have a high risk for diabetes, consider being tested at a younger age or more often.  A one-time screening for abdominal aortic aneurysm (AAA) by ultrasound is recommended for men aged 76-75 years who are current or former smokers. What should I know about preventing infection? Hepatitis B If you have a higher risk for hepatitis B, you should be screened for this virus. Talk with your health care provider to find out if you are at risk for hepatitis B infection. Hepatitis C Blood testing is recommended for:  Everyone born from [birth year] through 1964.  Anyone with known risk factors for hepatitis C.  Sexually Transmitted Diseases (STDs)  You should be screened each year for STDs including gonorrhea and chlamydia if: ? You are sexually active and are younger than 71 years of age. ? You are older than 71 years of age and your health care provider tells you that you are at risk for this type of infection. ? Your  sexual activity has changed since you were last screened and you are at an increased risk for chlamydia or gonorrhea. Ask your health care provider if you are at risk.  Talk with your health care provider about whether you are at high risk of being infected with HIV. Your health care provider may recommend a prescription medicine to help prevent HIV infection.  What else can I do?  Schedule regular health, dental, and eye exams.  Stay current with your vaccines (immunizations).  Do not use any tobacco products, such as cigarettes, chewing tobacco, and e-cigarettes. If you need help quitting, ask your health care provider.  Limit alcohol intake to no more than 2 drinks per day. One drink equals 12 ounces of beer, 5 ounces of wine, or 1 ounces of hard liquor.  Do not use street drugs.  Do not share needles.  Ask your health care provider for help if you need support or information  about quitting drugs.  Tell your health care provider if you often feel depressed.  Tell your health care provider if you have ever been abused or do not feel safe at home. This information is not intended to replace advice given to you by your health care provider. Make sure you discuss any questions you have with your health care provider. Document Released: 05/26/2008 Document Revised: 07/27/2016 Document Reviewed: 09/01/2015 Elsevier Interactive Patient Education  [employer].

## 2018-09-18 NOTE — Progress Notes (Signed)
Subjective:    Patient ID: Alfred Foster, male    DOB: Sep 24, 1946, 72 y.o.   MRN: [id number]  HPI He is here for a physical exam.   His right lower leg is still very swollen.  He thinks it has decreased a little.  He has slight pain in his calf when he first gets up in the morning but that resolves the more he is up.  He is elevating it when he is sitting.  He is taking the xarelto and denies side effects.  Medications and allergies reviewed with patient and updated if appropriate.  Patient Active Problem List   Diagnosis Date Noted  . Acute deep vein thrombosis (DVT) of calf muscle vein of right lower extremity 09/12/2018  . Cough 01/29/2018  . Carotid artery disease ([place]) 10/21/2017  . Right carotid bruit 09/15/2017  . Left shoulder pain 02/17/2016  . Lipoma of skin of abdomen 03/19/2015  . Nonspecific abnormal electrocardiogram (ECG) (EKG) 03/15/2012  . Essential hypertension 10/22/2010  . PROSTATE CANCER, HX OF 01/23/2009  . Diabetes type 2, controlled ([place]) 08/09/2007  . Hyperlipidemia 05/08/2007    Current Outpatient Medications on File Prior to Visit  Medication Sig Dispense Refill  . amLODipine (NORVASC) 10 MG tablet Take 1 tablet (10 mg total) by mouth daily. 90 tablet 3  . apixaban (ELIQUIS) 5 MG TABS tablet Take 1 tablet (5 mg total) by mouth 2 (two) times daily. 60 tablet 1  . Ascorbic Acid (VITAMIN C PO) Take by mouth daily.      Marland Kitchen aspirin 81 MG tablet Take 81 mg by mouth daily.    . Cholecalciferol (VITAMIN D3) 1000 UNITS CAPS Take 1,000 Units by mouth daily.    . COD LIVER OIL PO Take by mouth daily.      . Diclofenac Sodium 1.5 % SOLN Use bid 1 Bottle 0  . folic acid (FOLVITE) 553 MCG tablet Take 800 mcg by mouth daily.     . Lancets (ONETOUCH ULTRASOFT) lancets use as directed 100 each 1  . losartan (COZAAR) 100 MG tablet Take 1 tablet (100 mg total) by mouth daily. 90 tablet 3  . metoprolol succinate (TOPROL-XL) 50 MG 24 hr tablet TAKE 1 TABLET BY MOUTH  EVERY DAY(TAKE WITH OR IMMEDIATELY AFTER A MEAL) 30 tablet 3  . niacin 500 MG tablet Take 500 mg by mouth once.    . Omega-3 Fatty Acids (FISH OIL PO) Take 1,000 mg by mouth daily.     . ONE TOUCH ULTRA TEST test strip use as directed 100 each 1  . rosuvastatin (CRESTOR) 10 MG tablet TAKE 1 TABLET BY MOUTH EVERY DAY 30 tablet 3   No current facility-administered medications on file prior to visit.     Past Medical History:  Diagnosis Date  . Diabetes mellitus   . Hyperlipidemia   . Hypertension   . Prostate cancer [facility]) 2007   Dr Alinda Money    Past Surgical History:  Procedure Laterality Date  . COLONOSCOPY  2004 & 2014   Negative ;[facility]  . INGUINAL HERNIA REPAIR    . PROSTATECTOMY  04/2006   Dr Alinda Money  . UMBILICAL HERNIA REPAIR      Social History   Socioeconomic History  . Marital status: Married    Spouse name: Not on file  . Number of children: Not on file  . Years of education: Not on file  . Highest education level: Not on file  Occupational History  . Not  on file  Social Needs  . Financial resource strain: Not on file  . Food insecurity:    Worry: Not on file    Inability: Not on file  . Transportation needs:    Medical: Not on file    Non-medical: Not on file  Tobacco Use  . Smoking status: Never Smoker  . Smokeless tobacco: Never Used  Substance and Sexual Activity  . Alcohol use: No  . Drug use: No  . Sexual activity: Not on file  Lifestyle  . Physical activity:    Days per week: Not on file    Minutes per session: Not on file  . Stress: Not on file  Relationships  . Social connections:    Talks on phone: Not on file    Gets together: Not on file    Attends religious service: Not on file    Active member of club or organization: Not on file    Attends meetings of clubs or organizations: Not on file    Relationship status: Not on file  Other Topics Concern  . Not on file  Social History Narrative  . Not on file    Family History    Problem Relation Age of Onset  . Diabetes Father   . Hypertension Father   . Alzheimer's disease Father   . Hypertension Mother   . Alzheimer's disease Mother   . Heart attack Mother 61  . Diabetes Brother   . Heart disease Brother   . Heart attack Maternal Aunt        in 79s  . Stroke Neg Hx     Review of Systems  Constitutional: Negative for chills and fever.  Eyes: Negative for visual disturbance.  Respiratory: Negative for cough, shortness of breath and wheezing.   Cardiovascular: Positive for leg swelling (rle from DVT). Negative for chest pain and palpitations.  Gastrointestinal: Negative for abdominal pain, blood in stool, constipation, diarrhea and nausea.       No gerd  Genitourinary: Negative for difficulty urinating, dysuria and hematuria.  Musculoskeletal: Negative for arthralgias and back pain.  Skin: Negative for color change and rash.  Neurological: Negative for dizziness, light-headedness and headaches.  Psychiatric/Behavioral: Negative for dysphoric mood. The patient is not nervous/anxious.        Objective:   Vitals:   09/19/18 0807  BP: (!) 148/92  Pulse: 75  Resp: 16  Temp: 98.5 F (36.9 C)  SpO2: 98%   Filed Weights   09/19/18 0807  Weight: 234 lb (106.1 kg)   Body mass index is 30.87 kg/m.  BP Readings from Last 3 Encounters:  09/19/18 (!) 148/92  09/12/18 120/68  09/12/18 (!) 145/113     Wt Readings from Last 3 Encounters:  09/19/18 234 lb (106.1 kg)  09/12/18 231 lb (104.8 kg)  09/11/18 228 lb (103.4 kg)     Physical Exam Constitutional: He appears well-developed and well-nourished. No distress.  HENT:  Head: Normocephalic and atraumatic.  Right Ear: External ear normal.  Left Ear: External ear normal.  Mouth/Throat: Oropharynx is clear and moist.  Normal ear canals and TM b/l  Eyes: Conjunctivae and EOM are normal.  Neck: Neck supple. No tracheal deviation present. No thyromegaly present.  No carotid bruit   Cardiovascular: Normal rate, regular rhythm, normal heart sounds and intact distal pulses.  No murmur heard.  RLE edema up to knee- calf is very firm, mild pitting, mild calf tenderness Pulmonary/Chest: Effort normal and breath sounds normal. No respiratory distress.  He has no wheezes. He has no rales.  Abdominal: Soft. He exhibits no distension. There is no tenderness.  Genitourinary: deferred  Lymphadenopathy:   He has no cervical adenopathy.  Skin: Skin is warm and dry. He is not diaphoretic.  Psychiatric: He has a normal mood and affect. His behavior is normal.         Assessment & Plan:   Physical exam: Screening blood work  ordered Immunizations  Flu vaccine today,  Had shingrix, others up to date Colonoscopy    Up to date  Eye exams  Up to date  EKG   Done 09/2017 Exercise  Very active at work [employer]  Work on weight loss Skin   No concerns Substance abuse  none  See Problem List for Assessment and Plan of chronic medical problems.   FU in 6 months

## 2018-09-19 ENCOUNTER — Ambulatory Visit: Payer: Medicare Other

## 2018-09-19 ENCOUNTER — Other Ambulatory Visit: Payer: Self-pay | Admitting: Internal Medicine

## 2018-09-19 ENCOUNTER — Ambulatory Visit (INDEPENDENT_AMBULATORY_CARE_PROVIDER_SITE_OTHER): Payer: Medicare Other | Admitting: Internal Medicine

## 2018-09-19 ENCOUNTER — Encounter: Payer: Self-pay | Admitting: Internal Medicine

## 2018-09-19 VITALS — BP 148/92 | HR 75 | Temp 98.5°F | Resp 16 | Ht 73.0 in | Wt 234.0 lb

## 2018-09-19 DIAGNOSIS — I10 Essential (primary) hypertension: Secondary | ICD-10-CM | POA: Diagnosis not present

## 2018-09-19 DIAGNOSIS — Z0000 Encounter for general adult medical examination without abnormal findings: Secondary | ICD-10-CM | POA: Diagnosis not present

## 2018-09-19 DIAGNOSIS — E119 Type 2 diabetes mellitus without complications: Secondary | ICD-10-CM | POA: Diagnosis not present

## 2018-09-19 DIAGNOSIS — I82461 Acute embolism and thrombosis of right calf muscular vein: Secondary | ICD-10-CM

## 2018-09-19 DIAGNOSIS — Z23 Encounter for immunization: Secondary | ICD-10-CM | POA: Diagnosis not present

## 2018-09-19 DIAGNOSIS — E7800 Pure hypercholesterolemia, unspecified: Secondary | ICD-10-CM

## 2018-09-19 MED ORDER — METOPROLOL SUCCINATE ER 25 MG PO TB24: 75.0000 mg | ORAL_TABLET | Freq: Every day | ORAL | 3 refills | Status: DC

## 2018-09-19 NOTE — Assessment & Plan Note (Signed)
Check lipid panel  Continue daily statin Regular exercise and healthy diet encouraged  

## 2018-09-19 NOTE — Assessment & Plan Note (Signed)
On xarelto Elevate leg when sitting Advised to get a compression sock for RLE  Note given for work Will f/u in 3 months with [place] prior

## 2018-09-19 NOTE — Assessment & Plan Note (Signed)
Check a1c Low sugar / carb diet Stressed regular exercise, weight loss  

## 2018-09-19 NOTE — Assessment & Plan Note (Signed)
BP elevated and has been elevated on occasion Increase metoprolol to 75 mg daily Continue amlodipine and losartan at current doses Advised low sodium diet Weight loss cmp

## 2018-09-19 NOTE — Addendum Note (Signed)
Addended by: Delice Bison E on: 09/19/2018 11:32 AM   Modules accepted: Orders

## 2018-09-21 ENCOUNTER — Other Ambulatory Visit (INDEPENDENT_AMBULATORY_CARE_PROVIDER_SITE_OTHER): Payer: Medicare Other

## 2018-09-21 ENCOUNTER — Ambulatory Visit: Payer: Medicare Other

## 2018-09-21 DIAGNOSIS — I10 Essential (primary) hypertension: Secondary | ICD-10-CM | POA: Diagnosis not present

## 2018-09-21 DIAGNOSIS — E7800 Pure hypercholesterolemia, unspecified: Secondary | ICD-10-CM | POA: Diagnosis not present

## 2018-09-21 DIAGNOSIS — Z0000 Encounter for general adult medical examination without abnormal findings: Secondary | ICD-10-CM | POA: Diagnosis not present

## 2018-09-21 DIAGNOSIS — I82461 Acute embolism and thrombosis of right calf muscular vein: Secondary | ICD-10-CM

## 2018-09-21 DIAGNOSIS — E119 Type 2 diabetes mellitus without complications: Secondary | ICD-10-CM | POA: Diagnosis not present

## 2018-09-21 LAB — CBC WITH DIFFERENTIAL/PLATELET
BASOS ABS: 0.1 10*3/uL (ref 0.0–0.1)
BASOS PCT: 0.8 % (ref 0.0–3.0)
Eosinophils Absolute: 0.3 10*3/uL (ref 0.0–0.7)
Eosinophils Relative: 3.8 % (ref 0.0–5.0)
HEMATOCRIT: 39.6 % (ref 39.0–52.0)
Hemoglobin: 13.3 g/dL (ref 13.0–17.0)
LYMPHS PCT: 29.6 % (ref 12.0–46.0)
Lymphs Abs: 2.1 10*3/uL (ref 0.7–4.0)
MCHC: 33.7 g/dL (ref 30.0–36.0)
MCV: 93.5 fl (ref 78.0–100.0)
Monocytes Absolute: 0.7 10*3/uL (ref 0.1–1.0)
Monocytes Relative: 9.8 % (ref 3.0–12.0)
NEUTROS ABS: 4 10*3/uL (ref 1.4–7.7)
Neutrophils Relative %: 56 % (ref 43.0–77.0)
PLATELETS: 315 10*3/uL (ref 150.0–400.0)
RBC: 4.24 Mil/uL (ref 4.22–5.81)
RDW: 14 % (ref 11.5–15.5)
WBC: 7.2 10*3/uL (ref 4.0–10.5)

## 2018-09-21 LAB — TSH: TSH: 1.35 u[IU]/mL (ref 0.35–4.50)

## 2018-09-21 LAB — COMPREHENSIVE METABOLIC PANEL
ALBUMIN: 4 g/dL (ref 3.5–5.2)
ALT: 13 U/L (ref 0–53)
AST: 15 U/L (ref 0–37)
Alkaline Phosphatase: 85 U/L (ref 39–117)
BILIRUBIN TOTAL: 0.5 mg/dL (ref 0.2–1.2)
BUN: 20 mg/dL (ref 6–23)
CALCIUM: 9.4 mg/dL (ref 8.4–10.5)
CO2: 25 meq/L (ref 19–32)
CREATININE: 1.48 mg/dL (ref 0.40–1.50)
Chloride: 109 mEq/L (ref 96–112)
GFR: 60.22 mL/min (ref >60.00)
Glucose, Bld: 140 mg/dL — ABNORMAL HIGH (ref 70–99)
Potassium: 4 mEq/L (ref 3.5–5.1)
Sodium: 142 mEq/L (ref 135–145)
Total Protein: 7.3 g/dL (ref 6.0–8.3)

## 2018-09-21 LAB — LIPID PANEL
CHOL/HDL RATIO: 3
Cholesterol: 111 mg/dL (ref 0–200)
HDL: 34.3 mg/dL — AB (ref >39.00)
LDL Cholesterol: 67 mg/dL (ref 0–99)
NonHDL: 76.52
TRIGLYCERIDES: 47 mg/dL (ref 0.0–149.0)
VLDL: 9.4 mg/dL (ref 0.0–40.0)

## 2018-09-21 LAB — HEMOGLOBIN A1C: Hgb A1c MFr Bld: 7.2 % — ABNORMAL HIGH (ref 4.6–6.5)

## 2018-09-24 ENCOUNTER — Ambulatory Visit: Payer: Self-pay

## 2018-09-24 ENCOUNTER — Telehealth: Payer: Self-pay | Admitting: Internal Medicine

## 2018-09-24 MED ORDER — ROSUVASTATIN CALCIUM 10 MG PO TABS: 10.0000 mg | ORAL_TABLET | Freq: Every day | ORAL | 5 refills | Status: DC

## 2018-09-24 NOTE — Telephone Encounter (Signed)
Reviewed chart pt is up-to-date sent refills to [facility].../lmb  

## 2018-09-24 NOTE — Telephone Encounter (Signed)
LVM for wife to call back 

## 2018-09-24 NOTE — Telephone Encounter (Signed)
Returned call to pt.  Spoke with his wife.  Reported she wanted to clarify if the pt. is to continue taking ASA 81 mg. with the Eliquis BID.  Reported she wasn't at his last appt., and is requesting clarification on this from Dr. Quay Burow.  Denied that pt. has any specific complaints, in taking both the Eliquis and the ASA.  Advised will send note to Dr. Quay Burow to have her question answered.  Verb. Understanding.  Agrees with plan.       Reason for Disposition . Caller has NON-URGENT medication question about med that PCP prescribed and triager unable to answer question  Answer Assessment - Initial Assessment Questions 1. SYMPTOMS: "Do you have any symptoms?"     Wife denied any problems in taking Eliquis and ASA 81 mg.  Requested clarification from PCP if he is to continue taking the ASA with his blood thinner.   2. SEVERITY: If symptoms are present, ask "Are they mild, moderate or severe?"     N/A  Protocols used: MEDICATION QUESTION CALL-A-AH

## 2018-09-24 NOTE — Telephone Encounter (Signed)
Hold aspirin for now. °

## 2018-09-24 NOTE — Telephone Encounter (Signed)
Copied from [place] [id number]. Topic: Quick Communication - Rx Refill/Question >> Sep 24, 2018  9:51 AM Reyne Dumas L wrote: Medication:  rosuvastatin (CRESTOR) 10 MG tablet  Has the patient contacted their pharmacy? Yes - states no refills left (Agent: If no, request that the patient contact the pharmacy for the refill.) (Agent: If yes, when and what did the pharmacy advise?)  Preferred Pharmacy (with phone number or street name): [facility] [phone] - [place], [address] AT [facility] [address] [facility] [address] [place] [phone] (Phone) [phone] (Fax)  Agent: Please be advised that RX refills may take up to 3 business days. We ask that you follow-up with your pharmacy.

## 2018-09-25 NOTE — Telephone Encounter (Signed)
Pt's wife aware.

## 2018-09-27 ENCOUNTER — Telehealth: Payer: Self-pay | Admitting: Internal Medicine

## 2018-09-27 DIAGNOSIS — I82461 Acute embolism and thrombosis of right calf muscular vein: Secondary | ICD-10-CM

## 2018-09-27 NOTE — Telephone Encounter (Signed)
Copied from [place] [id number]. Topic: Quick Communication - See Telephone Encounter >> Sep 27, 2018 10:31 AM Rutherford Nail, NT wrote: CRM for notification. See Telephone encounter for: 09/27/18. Patient's wife, Deidre Ala, calling and states that the insurance company is needing more information as to why the patient is needing the medication eloquis to be changed from Tier 3 to Tier 2. Wife states that it is just too expensive. Wife states that someone needs to call insurance at [phone] and give them the reason. Please advise. CB#: [phone]

## 2018-09-27 NOTE — Telephone Encounter (Signed)
Copied from [place] [id number]. Topic: Quick Communication - Rx Refill/Question >> Sep 27, 2018  2:56 PM Reyne Dumas L wrote: Medication:  Compression socks.  Pt needs RX for these.  States that he needs 4 pair.  Has the patient contacted their pharmacy? no (Agent: If no, request that the patient contact the pharmacy for the refill.) (Agent: If yes, when and what did the pharmacy advise?)  Preferred Pharmacy (with phone number or street name): [facility] on [address]  Agent: Please be advised that RX refills may take up to 3 business days. We ask that you follow-up with your pharmacy.

## 2018-09-27 NOTE — Telephone Encounter (Signed)
Spoke with wife and advised that I called the insurance company to request a tier change. Insurance stated they will give me an answer in the next couple of days. Pts wife is aware.

## 2018-09-28 ENCOUNTER — Telehealth: Payer: Self-pay

## 2018-09-28 DIAGNOSIS — R1903 Right lower quadrant abdominal swelling, mass and lump: Secondary | ICD-10-CM | POA: Diagnosis not present

## 2018-09-28 NOTE — Telephone Encounter (Signed)
Rx has been faxed over.

## 2018-09-28 NOTE — Telephone Encounter (Signed)
Called and spoke with pts wife and advised that rx for compression socks [facility].

## 2018-09-28 NOTE — Telephone Encounter (Signed)
Copied from [place] [id number]. Topic: General - Other >> Sep 28, 2018  8:20 AM Judyann Munson wrote: Reason for CRM: Patient is requesting a call back from Allannah Kempen.please advise

## 2018-10-01 NOTE — Telephone Encounter (Signed)
Pt's wife states that the rx for Xarelto is the same price as the Eloquis.  Please have Delice Bison call pt back to discuss further options.   [phone]

## 2018-10-01 NOTE — Telephone Encounter (Signed)
Patient's wife advised of dr burns note/instructions---with patient's work schedule, it would be very hard for him to come in for INR monitoring---so she would like to just pay for the expensive medication right now to keep him on a blood thinner, if that changes and she needs to switch to something else (like warfarin) that's less expensive, she will call back to request---patient's wife advised of importance from dr burns that patient continues to remain on some type of blood thinner, wife understands patient needs to be on one medication or another---routing to dr burns, fyi.Marland KitchenMarland Kitchen

## 2018-10-01 NOTE — Telephone Encounter (Signed)
If those are not covered he may need to consider warfarin -it would be covered because it is not expensive, but requires close monitoring and he would need to come in frequently to see our nurse who manages this.  We can give him samples until we are able to figure this out I do not want him going without medication.

## 2018-10-01 NOTE — Telephone Encounter (Signed)
Are there any other blood thinners you would recommend for pt?

## 2018-10-02 ENCOUNTER — Ambulatory Visit: Payer: Medicare Other | Admitting: Sports Medicine

## 2018-10-02 ENCOUNTER — Encounter: Payer: Self-pay | Admitting: Sports Medicine

## 2018-10-02 DIAGNOSIS — D689 Coagulation defect, unspecified: Secondary | ICD-10-CM

## 2018-10-02 DIAGNOSIS — B351 Tinea unguium: Secondary | ICD-10-CM

## 2018-10-02 DIAGNOSIS — M79676 Pain in unspecified toe(s): Secondary | ICD-10-CM

## 2018-10-02 DIAGNOSIS — M79675 Pain in left toe(s): Secondary | ICD-10-CM | POA: Diagnosis not present

## 2018-10-02 DIAGNOSIS — E1142 Type 2 diabetes mellitus with diabetic polyneuropathy: Secondary | ICD-10-CM

## 2018-10-02 DIAGNOSIS — I82461 Acute embolism and thrombosis of right calf muscular vein: Secondary | ICD-10-CM

## 2018-10-02 DIAGNOSIS — M79674 Pain in right toe(s): Secondary | ICD-10-CM

## 2018-10-02 NOTE — Progress Notes (Signed)
Subjective: Alfred Foster is a 72 y.o. male patient with history of diabetes who returns to office today complaining of long, painful nails while ambulating in shoes; unable to trim. Patient states that the glucose reading this morning was not recorded. Currently on Eliqus for right leg DVT. No other issues.  Patient Active Problem List   Diagnosis Date Noted  . Acute deep vein thrombosis (DVT) of calf muscle vein of right lower extremity 09/12/2018  . Carotid artery disease ([place]) 10/21/2017  . Right carotid bruit 09/15/2017  . Left shoulder pain 02/17/2016  . Lipoma of skin of abdomen 03/19/2015  . Nonspecific abnormal electrocardiogram (ECG) (EKG) 03/15/2012  . Essential hypertension 10/22/2010  . PROSTATE CANCER, HX OF 01/23/2009  . Diabetes type 2, controlled ([place]) 08/09/2007  . Hyperlipidemia 05/08/2007   Current Outpatient Medications on File Prior to Visit  Medication Sig Dispense Refill  . amLODipine (NORVASC) 10 MG tablet Take 1 tablet (10 mg total) by mouth daily. 90 tablet 3  . apixaban (ELIQUIS) 5 MG TABS tablet Take 1 tablet (5 mg total) by mouth 2 (two) times daily. 60 tablet 1  . Ascorbic Acid (VITAMIN C PO) Take by mouth daily.      Marland Kitchen aspirin 81 MG tablet Take 81 mg by mouth daily.    . Cholecalciferol (VITAMIN D3) 1000 UNITS CAPS Take 1,000 Units by mouth daily.    . COD LIVER OIL PO Take by mouth daily.      . Diclofenac Sodium 1.5 % SOLN Use bid 1 Bottle 0  . folic acid (FOLVITE) 762 MCG tablet Take 800 mcg by mouth daily.     . Lancets (ONETOUCH ULTRASOFT) lancets use as directed 100 each 1  . losartan (COZAAR) 100 MG tablet Take 1 tablet (100 mg total) by mouth daily. 90 tablet 3  . metoprolol succinate (TOPROL-XL) 25 MG 24 hr tablet Take 3 tablets (75 mg total) by mouth daily. 270 tablet 3  . niacin 500 MG tablet Take 500 mg by mouth once.    . Omega-3 Fatty Acids (FISH OIL PO) Take 1,000 mg by mouth daily.     . ONE TOUCH ULTRA TEST test strip use as directed  100 each 1  . rosuvastatin (CRESTOR) 10 MG tablet Take 1 tablet (10 mg total) by mouth daily. 30 tablet 5   No current facility-administered medications on file prior to visit.    Allergies  Allergen Reactions  . Hydrochlorothiazide     SOB, dizzy    Recent Results (from the past 2160 hour(s))  I-Stat Chem 8, ED     Status: Abnormal   Collection Time: 09/11/18 11:37 PM  Result Value Ref Range   Sodium 141 135 - 145 mmol/L   Potassium 4.0 3.5 - 5.1 mmol/L   Chloride 106 98 - 111 mmol/L   BUN 17 8 - 23 mg/dL   Creatinine, Ser 1.60 (H) 0.61 - 1.24 mg/dL   Glucose, Bld 119 (H) 70 - 99 mg/dL   Calcium, Ion 1.12 (L) 1.15 - 1.40 mmol/L   TCO2 26 22 - 32 mmol/L   Hemoglobin 13.9 13.0 - 17.0 g/dL   HCT 41.0 39.0 - 52.0 %  Hemoglobin A1c     Status: Abnormal   Collection Time: 09/21/18  8:09 AM  Result Value Ref Range   Hgb A1c MFr Bld 7.2 (H) 4.6 - 6.5 %    Comment: Glycemic Control Guidelines for People with Diabetes:Non Diabetic:  <6%Goal of Therapy: <7%Additional Action Suggested:  >  8%   TSH     Status: None   Collection Time: 09/21/18  8:09 AM  Result Value Ref Range   TSH 1.35 0.35 - 4.50 uIU/mL  Lipid panel     Status: Abnormal   Collection Time: 09/21/18  8:09 AM  Result Value Ref Range   Cholesterol 111 0 - 200 mg/dL    Comment: ATP III Classification       Desirable:  < 200 mg/dL               Borderline High:  200 - 239 mg/dL          High:  > = 240 mg/dL   Triglycerides 47.0 0.0 - 149.0 mg/dL    Comment: Normal:  <150 mg/dLBorderline High:  150 - 199 mg/dL   HDL 34.30 (L) >39.00 mg/dL   VLDL 9.4 0.0 - 40.0 mg/dL   LDL Cholesterol 67 0 - 99 mg/dL   Total CHOL/HDL Ratio 3     Comment:                Men          Women1/2 Average Risk     3.4          3.3Average Risk          5.0          4.42X Average Risk          9.6          7.13X Average Risk          15.0          11.0                       NonHDL 76.52     Comment: NOTE:  Non-HDL goal should be 30 mg/dL higher  than patient's LDL goal (i.e. LDL goal of < 70 mg/dL, would have non-HDL goal of < 100 mg/dL)  Comprehensive metabolic panel     Status: Abnormal   Collection Time: 09/21/18  8:09 AM  Result Value Ref Range   Sodium 142 135 - 145 mEq/L   Potassium 4.0 3.5 - 5.1 mEq/L   Chloride 109 96 - 112 mEq/L   CO2 25 19 - 32 mEq/L   Glucose, Bld 140 (H) 70 - 99 mg/dL   BUN 20 6 - 23 mg/dL   Creatinine, Ser 1.48 0.40 - 1.50 mg/dL   Total Bilirubin 0.5 0.2 - 1.2 mg/dL   Alkaline Phosphatase 85 39 - 117 U/L   AST 15 0 - 37 U/L   ALT 13 0 - 53 U/L   Total Protein 7.3 6.0 - 8.3 g/dL   Albumin 4.0 3.5 - 5.2 g/dL   Calcium 9.4 8.4 - 10.5 mg/dL   GFR 60.22 >60.00 mL/min  CBC with Differential/Platelet     Status: None   Collection Time: 09/21/18  8:09 AM  Result Value Ref Range   WBC 7.2 4.0 - 10.5 K/uL   RBC 4.24 4.22 - 5.81 Mil/uL   Hemoglobin 13.3 13.0 - 17.0 g/dL   HCT 39.6 39.0 - 52.0 %   MCV 93.5 78.0 - 100.0 fl   MCHC 33.7 30.0 - 36.0 g/dL   RDW 14.0 11.5 - 15.5 %   Platelets 315.0 150.0 - 400.0 K/uL   Neutrophils Relative % 56.0 43.0 - 77.0 %   Lymphocytes Relative 29.6 12.0 - 46.0 %   Monocytes Relative 9.8 3.0 - 12.0 %  Eosinophils Relative 3.8 0.0 - 5.0 %   Basophils Relative 0.8 0.0 - 3.0 %   Neutro Abs 4.0 1.4 - 7.7 K/uL   Lymphs Abs 2.1 0.7 - 4.0 K/uL   Monocytes Absolute 0.7 0.1 - 1.0 K/uL   Eosinophils Absolute 0.3 0.0 - 0.7 K/uL   Basophils Absolute 0.1 0.0 - 0.1 K/uL    Objective: General: Patient is awake, alert, and oriented x 3 and in no acute distress.  Integument: Skin is warm, dry and supple bilateral. Nails are tender, long, thickened and dystrophic with subungual debris, consistent with onychomycosis, 1-5 bilateral. No signs of infection. No open lesions. + callus minimal at medial hallux present bilateral. Remaining integument unremarkable.  Vasculature:  Dorsalis Pedis pulse 2/4 bilateral. Posterior Tibial pulse  1/4 left 0/4 on right. Capillary fill time <3  sec 1-5 bilateral. Positive hair growth to the level of the digits.Temperature gradient within normal limits. No varicosities present bilateral. + edema to right LE with warmth. + DVT on eliqus.   Neurology: The patient has intact sensation measured with a 5.07/10g Semmes Weinstein Monofilament at all pedal sites bilateral . Vibratory sensation diminished bilateral with tuning fork. No Babinski sign present bilateral.   Musculoskeletal: Asymptomatic bunion and planus foot type pedal deformities noted bilateral. Muscular strength 5/5 in all lower extremity muscular groups bilateral without pain on range of motion. No tenderness with calf compression bilateral.  Assessment and Plan: Problem List Items Addressed This Visit      Cardiovascular and Mediastinum   Acute deep vein thrombosis (DVT) of calf muscle vein of right lower extremity    Other Visit Diagnoses    Dermatophytosis of nail    -  Primary   Diabetic polyneuropathy associated with type 2 diabetes mellitus (HCC)       Toe pain, bilateral       Coagulopathy ([place])          -Examined patient. -Discussed and educated patient on diabetic foot care, especially with  regards to the vascular, neurological and musculoskeletal systems.  -Mechanically debrided all nails 1-5 bilateral using sterile nail nipper and filed with dremel without incident.  -Continue with diabetic shoes and recs from PCP on DVT  -Answered all patient questions -Patient to return in 3 months for at risk foot care -Patient advised to call the office if any problems or questions arise in the meantime.   Landis Martins, DPM

## 2018-10-28 ENCOUNTER — Other Ambulatory Visit: Payer: Self-pay | Admitting: Internal Medicine

## 2018-12-08 ENCOUNTER — Other Ambulatory Visit: Payer: Self-pay | Admitting: Internal Medicine

## 2018-12-13 NOTE — Progress Notes (Signed)
Subjective:    Patient ID: Alfred Foster, male    DOB: March 17, 1946, 73 y.o.   MRN: [id number]  HPI The patient is here for follow up.  DVT  RLE, dx 09/12/18:  He is here for a three month follow up.  He has been taking eliquis daily as prescribed.  He denies side effects including bleeding or abnormal bruising.  He has some mild hyperpigmentation of the RLE.  He denies soreness.  The right leg is larger than the left leg.  He is some swelling in it at times.  He wears a compression sock daily and elevates the leg.  His swelling is controlled.    Hypertension: He is taking his medication daily as prescribed.  He denies chest pain, palpitations, headaches and lightheadedness.  He has not been checking his blood pressure at home, but has a cough and he can do so.  He is active at work, but is not exercising regularly.  He does not monitor the amount of sodium he intakes.  Diabetes: He is controlling his sugars with diet.  He is not always compliant with a low sugar/carbohydrate diet.  He is not currently exercising, but is still working and is active at work.  He does not check his sugars at home regularly.  He is following with podiatry and will be getting new diabetic shoes.    Medications and allergies reviewed with patient and updated if appropriate.  Patient Active Problem List   Diagnosis Date Noted  . Acute deep vein thrombosis (DVT) of calf muscle vein of right lower extremity 09/12/2018  . Carotid artery disease ([place]) 10/21/2017  . Right carotid bruit 09/15/2017  . Left shoulder pain 02/17/2016  . Lipoma of skin of abdomen 03/19/2015  . Nonspecific abnormal electrocardiogram (ECG) (EKG) 03/15/2012  . Essential hypertension 10/22/2010  . PROSTATE CANCER, HX OF 01/23/2009  . Diabetes type 2, controlled ([place]) 08/09/2007  . Hyperlipidemia 05/08/2007    Current Outpatient Medications on File Prior to Visit  Medication Sig Dispense Refill  . amLODipine (NORVASC) 10 MG tablet Take  1 tablet (10 mg total) by mouth daily. 90 tablet 3  . Ascorbic Acid (VITAMIN C PO) Take by mouth daily.      Marland Kitchen aspirin 81 MG tablet Take 81 mg by mouth daily.    . Cholecalciferol (VITAMIN D3) 1000 UNITS CAPS Take 1,000 Units by mouth daily.    . COD LIVER OIL PO Take by mouth daily.      . Diclofenac Sodium 1.5 % SOLN Use bid 1 Bottle 0  . ELIQUIS 5 MG TABS tablet TAKE 1 TABLET(5 MG) BY MOUTH TWICE DAILY 60 tablet 0  . folic acid (FOLVITE) 195 MCG tablet Take 800 mcg by mouth daily.     . Lancets (ONETOUCH ULTRASOFT) lancets use as directed 100 each 1  . losartan (COZAAR) 100 MG tablet Take 1 tablet (100 mg total) by mouth daily. 90 tablet 3  . metoprolol succinate (TOPROL-XL) 25 MG 24 hr tablet Take 3 tablets (75 mg total) by mouth daily. 270 tablet 3  . niacin 500 MG tablet Take 500 mg by mouth once.    . Omega-3 Fatty Acids (FISH OIL PO) Take 1,000 mg by mouth daily.     . ONE TOUCH ULTRA TEST test strip use as directed 100 each 1  . rosuvastatin (CRESTOR) 10 MG tablet Take 1 tablet (10 mg total) by mouth daily. 30 tablet 5   No current facility-administered medications on  file prior to visit.     Past Medical History:  Diagnosis Date  . Diabetes mellitus   . Hyperlipidemia   . Hypertension   . Prostate cancer [facility]) 2007   Dr Alinda Money    Past Surgical History:  Procedure Laterality Date  . COLONOSCOPY  2004 & 2014   Negative ;[facility]  . INGUINAL HERNIA REPAIR    . PROSTATECTOMY  04/2006   Dr Alinda Money  . UMBILICAL HERNIA REPAIR      Social History   Socioeconomic History  . Marital status: Married    Spouse name: Not on file  . Number of children: Not on file  . Years of education: Not on file  . Highest education level: Not on file  Occupational History  . Not on file  Social Needs  . Financial resource strain: Not on file  . Food insecurity:    Worry: Not on file    Inability: Not on file  . Transportation needs:    Medical: Not on file    Non-medical: Not on  file  Tobacco Use  . Smoking status: Never Smoker  . Smokeless tobacco: Never Used  Substance and Sexual Activity  . Alcohol use: No  . Drug use: No  . Sexual activity: Not on file  Lifestyle  . Physical activity:    Days per week: Not on file    Minutes per session: Not on file  . Stress: Not on file  Relationships  . Social connections:    Talks on phone: Not on file    Gets together: Not on file    Attends religious service: Not on file    Active member of club or organization: Not on file    Attends meetings of clubs or organizations: Not on file    Relationship status: Not on file  Other Topics Concern  . Not on file  Social History Narrative  . Not on file    Family History  Problem Relation Age of Onset  . Diabetes Father   . Hypertension Father   . Alzheimer's disease Father   . Hypertension Mother   . Alzheimer's disease Mother   . Heart attack Mother 66  . Diabetes Brother   . Heart disease Brother   . Heart attack Maternal Aunt        in 88s  . Stroke Neg Hx     Review of Systems  Constitutional: Negative for fever.  Respiratory: Negative for cough, shortness of breath and wheezing.   Cardiovascular: Positive for leg swelling. Negative for chest pain and palpitations.  Neurological: Positive for dizziness (after bending over). Negative for light-headedness and headaches.       Objective:   Vitals:   12/14/18 0751 12/14/18 0836  BP: (!) 162/92 (!) 168/94  Pulse: 68   Resp: 16   Temp: 98.3 F (36.8 C)   SpO2: 98%    BP Readings from Last 3 Encounters:  12/14/18 (!) 168/94  09/19/18 (!) 148/92  09/12/18 120/68   Wt Readings from Last 3 Encounters:  12/14/18 232 lb (105.2 kg)  09/19/18 234 lb (106.1 kg)  09/12/18 231 lb (104.8 kg)   Body mass index is 30.61 kg/m.   Physical Exam    Constitutional: Appears well-developed and well-nourished. No distress.  HENT:  Head: Normocephalic and atraumatic.  Neck: Neck supple. No tracheal  deviation present. No thyromegaly present.  No cervical lymphadenopathy Cardiovascular: Normal rate, regular rhythm and normal heart sounds.   No murmur  heard. No carotid bruit .  Right lower extremity larger than left-no pitting edema in either lower extremity, trace swelling right lower extremity Pulmonary/Chest: Effort normal and breath sounds normal. No respiratory distress. No has no wheezes. No rales.  Skin: Skin is warm and dry. Not diaphoretic.  Right lower extremity slightly darker than left lower extremity-related to DVT Psychiatric: Normal mood and affect. Behavior is normal.    Diabetic Foot Exam - Simple   Simple Foot Form Diabetic Foot exam was performed with the following findings:  Yes 12/14/2018  8:20 AM  Visual Inspection See comments:  Yes Sensation Testing Intact to touch and monofilament testing bilaterally:  Yes Pulse Check Posterior Tibialis and Dorsalis pulse intact bilaterally:  Yes Comments Bunion right foot.  Mild dryness end of toes.  Thickened darker toenails b/l.      Assessment & Plan:    See Problem List for Assessment and Plan of chronic medical problems.

## 2018-12-14 ENCOUNTER — Encounter: Payer: Self-pay | Admitting: Internal Medicine

## 2018-12-14 ENCOUNTER — Ambulatory Visit (INDEPENDENT_AMBULATORY_CARE_PROVIDER_SITE_OTHER): Payer: Medicare Other | Admitting: Internal Medicine

## 2018-12-14 ENCOUNTER — Other Ambulatory Visit (INDEPENDENT_AMBULATORY_CARE_PROVIDER_SITE_OTHER): Payer: Medicare Other

## 2018-12-14 VITALS — BP 168/94 | HR 68 | Temp 98.3°F | Resp 16 | Ht 73.0 in | Wt 232.0 lb

## 2018-12-14 DIAGNOSIS — I82461 Acute embolism and thrombosis of right calf muscular vein: Secondary | ICD-10-CM

## 2018-12-14 DIAGNOSIS — E119 Type 2 diabetes mellitus without complications: Secondary | ICD-10-CM

## 2018-12-14 DIAGNOSIS — I10 Essential (primary) hypertension: Secondary | ICD-10-CM

## 2018-12-14 LAB — COMPREHENSIVE METABOLIC PANEL
ALK PHOS: 86 U/L (ref 39–117)
ALT: 16 U/L (ref 0–53)
AST: 17 U/L (ref 0–37)
Albumin: 4.3 g/dL (ref 3.5–5.2)
BILIRUBIN TOTAL: 0.4 mg/dL (ref 0.2–1.2)
BUN: 21 mg/dL (ref 6–23)
CO2: 24 mEq/L (ref 19–32)
CREATININE: 1.45 mg/dL (ref 0.40–1.50)
Calcium: 9.3 mg/dL (ref 8.4–10.5)
Chloride: 108 mEq/L (ref 96–112)
GFR: 61.62 mL/min (ref >60.00)
GLUCOSE: 130 mg/dL — AB (ref 70–99)
Potassium: 4.1 mEq/L (ref 3.5–5.1)
SODIUM: 140 meq/L (ref 135–145)
Total Protein: 7.5 g/dL (ref 6.0–8.3)

## 2018-12-14 LAB — CBC WITH DIFFERENTIAL/PLATELET
BASOS ABS: 0 10*3/uL (ref 0.0–0.1)
Basophils Relative: 0.6 % (ref 0.0–3.0)
EOS ABS: 0.2 10*3/uL (ref 0.0–0.7)
Eosinophils Relative: 3.4 % (ref 0.0–5.0)
HCT: 43.4 % (ref 39.0–52.0)
Hemoglobin: 14.5 g/dL (ref 13.0–17.0)
LYMPHS ABS: 2 10*3/uL (ref 0.7–4.0)
Lymphocytes Relative: 32.3 % (ref 12.0–46.0)
MCHC: 33.4 g/dL (ref 30.0–36.0)
MCV: 95.1 fl (ref 78.0–100.0)
MONO ABS: 0.6 10*3/uL (ref 0.1–1.0)
MONOS PCT: 10.2 % (ref 3.0–12.0)
NEUTROS PCT: 53.5 % (ref 43.0–77.0)
Neutro Abs: 3.3 10*3/uL (ref 1.4–7.7)
Platelets: 253 10*3/uL (ref 150.0–400.0)
RBC: 4.56 Mil/uL (ref 4.22–5.81)
RDW: 14.1 % (ref 11.5–15.5)
WBC: 6.2 10*3/uL (ref 4.0–10.5)

## 2018-12-14 LAB — HEMOGLOBIN A1C: Hgb A1c MFr Bld: 6.9 % — ABNORMAL HIGH (ref 4.6–6.5)

## 2018-12-14 NOTE — Assessment & Plan Note (Signed)
Diet controlled Not always compliant with low sugar/carb diet Active, but not exercising Check A1c-if A1c is more than 7 will need to start medication Stressed low sugar/carb diet and remaining active

## 2018-12-14 NOTE — Patient Instructions (Addendum)
  Tests ordered today. Your results will be released to [place] (or called to you) after review, usually within 72hours after test completion. If any changes need to be made, you will be notified at that same time.    Medications reviewed and updated.  Changes include :   none   Please followup in 6 months    STOP ELIQUIS ON April 3RD

## 2018-12-14 NOTE — Assessment & Plan Note (Signed)
Poorly controlled He has not been checking his blood pressure at home as instructed He is taking his medications daily He is not compliant with a low-sodium diet At this point he does not feel that his blood pressure is this high and would like to monitor at home before making any changes Continue current medications at current doses Advised him to call if his blood pressure is elevated at home CMP

## 2018-12-14 NOTE — Assessment & Plan Note (Addendum)
Taking Eliquis twice daily as prescribed Wearing compression socks daily and elevating leg when sitting-overall swelling controlled Right leg larger than left leg, which we discussed will likely be chronic No pain in right lower extremity Discussed that at this point I would recommend continuing Eliquis for an additional 3 months given the extensive clot he has We will hold off on any imaging since it will not change treatment Discontinue Eliquis on April 3 CMP, CBC today

## 2019-01-01 ENCOUNTER — Ambulatory Visit: Payer: Medicare Other | Admitting: Podiatry

## 2019-01-01 ENCOUNTER — Ambulatory Visit: Payer: Medicare Other | Admitting: Sports Medicine

## 2019-01-01 DIAGNOSIS — D689 Coagulation defect, unspecified: Secondary | ICD-10-CM

## 2019-01-01 DIAGNOSIS — B351 Tinea unguium: Secondary | ICD-10-CM

## 2019-01-01 DIAGNOSIS — E1142 Type 2 diabetes mellitus with diabetic polyneuropathy: Secondary | ICD-10-CM | POA: Diagnosis not present

## 2019-01-01 DIAGNOSIS — M79674 Pain in right toe(s): Secondary | ICD-10-CM

## 2019-01-01 DIAGNOSIS — M79675 Pain in left toe(s): Secondary | ICD-10-CM | POA: Diagnosis not present

## 2019-01-02 NOTE — Progress Notes (Signed)
Subjective: 73 y.o. returns the office today for painful, elongated, thickened toenails which he cannot trim himself. Denies any redness or drainage around the nails. Denies any acute changes since last appointment and no new complaints today. Denies any systemic complaints such as fevers, chills, nausea, vomiting.   He has a history of DVT on the right leg last year and he is still on eliquis.   PCP: Binnie Rail, MD  Objective: AAO 3, NAD DP/PT pulses palpable, CRT less than 3 seconds Nails hypertrophic, dystrophic, elongated, brittle, discolored 10. There is tenderness overlying the nails 1-5 bilaterally. There is no surrounding erythema or drainage along the nail sites. No open lesions or pre-ulcerative lesions are identified. No other areas of tenderness bilateral lower extremities. No overlying edema, erythema, increased warmth. No pain with calf compression, swelling, warmth, erythema.  Assessment: Patient presents with symptomatic onychomycosis  Plan: -Treatment options including alternatives, risks, complications were discussed -Nails sharply debrided 10 without complication/bleeding. -Discussed daily foot inspection. If there are any changes, to call the office immediately.  -Follow-up in 3 months or sooner if any problems are to arise. In the meantime, encouraged to call the office with any questions, concerns, changes symptoms.  Celesta Gentile, DPM

## 2019-01-04 ENCOUNTER — Other Ambulatory Visit: Payer: Self-pay

## 2019-01-04 NOTE — Patient Outreach (Signed)
[facility]) Care Management  01/04/2019  Alfred Foster 10-28-1946 [id number]   Medication Adherence call to [name] patient pick up both medication Losartan 100 mg and Rosuvastatin 10 mg [facility] said last pick up was on 12/15/18 for losartan and Rosuvastatin was pick up on 12/25/18 both for a 90 days supply. [name] is showing past due under [facility].   [facility] Management Direct Dial [phone]  Fax [phone] [email or web address]

## 2019-01-08 ENCOUNTER — Other Ambulatory Visit: Payer: Self-pay | Admitting: Internal Medicine

## 2019-02-09 ENCOUNTER — Other Ambulatory Visit: Payer: Self-pay | Admitting: Internal Medicine

## 2019-02-15 ENCOUNTER — Ambulatory Visit: Payer: Medicare Other | Admitting: Orthotics

## 2019-02-15 DIAGNOSIS — M2142 Flat foot [pes planus] (acquired), left foot: Secondary | ICD-10-CM | POA: Diagnosis not present

## 2019-02-15 DIAGNOSIS — M2141 Flat foot [pes planus] (acquired), right foot: Secondary | ICD-10-CM | POA: Diagnosis not present

## 2019-02-15 DIAGNOSIS — E1159 Type 2 diabetes mellitus with other circulatory complications: Secondary | ICD-10-CM | POA: Diagnosis not present

## 2019-03-08 ENCOUNTER — Ambulatory Visit: Payer: Medicare Other | Admitting: Podiatry

## 2019-03-21 ENCOUNTER — Telehealth: Payer: Self-pay | Admitting: Internal Medicine

## 2019-03-21 DIAGNOSIS — I82461 Acute embolism and thrombosis of right calf muscular vein: Secondary | ICD-10-CM

## 2019-03-21 NOTE — Telephone Encounter (Signed)
Copied from [place] [id number]. Topic: Quick Communication - Rx Refill/Question >> Mar 21, 2019  2:00 PM [place], [place] A wrote: Medication: Compression socks ( Patients wife called in and requested prescription [facility] fax number [phone])  Has the patient contacted their pharmacy? Yes (Agent: If no, request that the patient contact the pharmacy for the refill.) (Agent: If yes, when and what did the pharmacy advise?)  Preferred Pharmacy (with phone number or street name): [facility] [phone] - [place], [address] AT [address] [place] [phone] (Phone) [phone] (Fax)    Agent: Please be advised that RX refills may take up to 3 business days. We ask that you follow-up with your pharmacy.

## 2019-03-23 MED ORDER — MEDICAL COMPRESSION SOCKS MISC: 0 refills | Status: AC

## 2019-03-23 NOTE — Telephone Encounter (Signed)
printed

## 2019-03-25 NOTE — Telephone Encounter (Signed)
Faxed to [facility]  

## 2019-03-27 ENCOUNTER — Telehealth: Payer: Self-pay | Admitting: Internal Medicine

## 2019-03-27 MED ORDER — APIXABAN 5 MG PO TABS: ORAL_TABLET | ORAL | 1 refills | Status: DC

## 2019-03-27 NOTE — Telephone Encounter (Signed)
Copied from [place] [id number]. Topic: Quick Communication - Rx Refill/Question >> Mar 27, 2019  1:53 PM Celene Kras A wrote: Medication: ELIQUIS 5 MG TABS tablet  Has the patient contacted their pharmacy? Yes.   (Agent: If no, request that the patient contact the pharmacy for the refill.) (Agent: If yes, when and what did the pharmacy advise?)  Preferred Pharmacy (with phone number or street name): [facility] [phone] - [place], [address] [place] [address] [place] [address] Phone: [phone] Fax: [phone] Not a 24 hour pharmacy; exact hours not known.    Agent: Please be advised that RX refills may take up to 3 business days. We ask that you follow-up with your pharmacy.

## 2019-03-27 NOTE — Telephone Encounter (Signed)
Rx sent 

## 2019-03-28 NOTE — Addendum Note (Signed)
Addended by: Delice Bison E on: 03/28/2019 02:30 PM   Modules accepted: Orders

## 2019-03-28 NOTE — Telephone Encounter (Signed)
Spoke with wife and explained per OV note in January he is to stop the eliquis on 4/3. Wife expressed understanding. Rx was sent by accident.

## 2019-03-28 NOTE — Telephone Encounter (Signed)
Pt wife, Deidre Ala, called back stating she asked if the pt is supposed to continue taking Eliquis. She said that she thought pt was supposed to stop taking it on 4/6. Before they pick up additional medications is pt supposed to continue. Please call back at [phone].

## 2019-03-30 ENCOUNTER — Other Ambulatory Visit: Payer: Self-pay | Admitting: Internal Medicine

## 2019-04-01 ENCOUNTER — Telehealth: Payer: Self-pay | Admitting: Internal Medicine

## 2019-04-01 NOTE — Telephone Encounter (Signed)
Copied from [place] [id number]. Topic: Quick Communication - Rx Refill/Question >> Apr 01, 2019 10:49 AM Celene Kras A wrote: Medication: rosuvastatin (CRESTOR) 10 MG tablet [[id number]]  Has the patient contacted their pharmacy? Yes.   (Agent: If no, request that the patient contact the pharmacy for the refill.) (Agent: If yes, when and what did the pharmacy advise?)  Preferred Pharmacy (with phone number or street name): [facility] [phone] - [address], [place] [address] [place] [address] Phone: [phone] Fax: [phone] Not a 24 hour pharmacy; exact hours not known.    Agent: Please be advised that RX refills may take up to 3 business days. We ask that you follow-up with your pharmacy.

## 2019-04-01 NOTE — Telephone Encounter (Signed)
Rx has been sent  

## 2019-04-12 DIAGNOSIS — I739 Peripheral vascular disease, unspecified: Secondary | ICD-10-CM | POA: Diagnosis not present

## 2019-04-19 ENCOUNTER — Other Ambulatory Visit: Payer: Self-pay

## 2019-04-19 ENCOUNTER — Ambulatory Visit: Payer: Medicare Other | Admitting: Podiatry

## 2019-04-19 VITALS — Temp 97.2°F

## 2019-04-19 DIAGNOSIS — L84 Corns and callosities: Secondary | ICD-10-CM | POA: Diagnosis not present

## 2019-04-19 DIAGNOSIS — B351 Tinea unguium: Secondary | ICD-10-CM | POA: Diagnosis not present

## 2019-04-19 DIAGNOSIS — E119 Type 2 diabetes mellitus without complications: Secondary | ICD-10-CM | POA: Diagnosis not present

## 2019-04-25 ENCOUNTER — Encounter: Payer: Self-pay | Admitting: Podiatry

## 2019-04-25 NOTE — Progress Notes (Signed)
Subjective: Alfred Foster is a 73 y.o. y.o. male who presents for preventative diabetic footcare on today with cc of painful, discolored, thick toenails and painful corns which interfere with daily activities. Pain is aggravated when wearing enclosed shoe gear and relieved with periodic professional debridement.  Was diagnosed with DVT right LE. Completed Eliquis, now using compression knee highs daily.   Binnie Rail, MD is his PCP and last visit was 12/14/2018.  Current Outpatient Medications:  .  amLODipine (NORVASC) 10 MG tablet, Take 1 tablet (10 mg total) by mouth daily., Disp: 90 tablet, Rfl: 3 .  Ascorbic Acid (VITAMIN C PO), Take by mouth daily.  , Disp: , Rfl:  .  Cholecalciferol (VITAMIN D3) 1000 UNITS CAPS, Take 1,000 Units by mouth daily., Disp: , Rfl:  .  COD LIVER OIL PO, Take by mouth daily.  , Disp: , Rfl:  .  Diclofenac Sodium 1.5 % SOLN, Use bid, Disp: 1 Bottle, Rfl: 0 .  Elastic Bandages & Supports (MEDICAL COMPRESSION SOCKS) MISC, Use daily for leg swelling from DVT.  Dx: P29.518, Disp: 2 each, Rfl: 0 .  folic acid (FOLVITE) 841 MCG tablet, Take 800 mcg by mouth daily. , Disp: , Rfl:  .  Lancets (ONETOUCH ULTRASOFT) lancets, use as directed, Disp: 100 each, Rfl: 1 .  losartan (COZAAR) 100 MG tablet, Take 1 tablet (100 mg total) by mouth daily., Disp: 90 tablet, Rfl: 3 .  metoprolol succinate (TOPROL-XL) 25 MG 24 hr tablet, Take 3 tablets (75 mg total) by mouth daily., Disp: 270 tablet, Rfl: 3 .  niacin 500 MG tablet, Take 500 mg by mouth once., Disp: , Rfl:  .  Omega-3 Fatty Acids (FISH OIL PO), Take 1,000 mg by mouth daily. , Disp: , Rfl:  .  ONE TOUCH ULTRA TEST test strip, use as directed, Disp: 100 each, Rfl: 1 .  rosuvastatin (CRESTOR) 10 MG tablet, TAKE 1 TABLET(10 MG) BY MOUTH DAILY, Disp: 90 tablet, Rfl: 1 .  aspirin 81 MG tablet, Take 81 mg by mouth daily., Disp: , Rfl:   Allergies  Allergen Reactions  . Hydrochlorothiazide     SOB, dizzy      Objective:  Vascular Examination: Capillary refill time less than 3 seconds x 10 digits.  Dorsalis pedis pulses palpable b/l.  Posterior tibial pulses palpable b/l.  Digital hair absent x 10 digits.  Skin temperature gradient WNL b/l.  Dermatological Examination: Skin with normal turgor, texture and tone b/l.  Toenails 1-5 b/l discolored, thick, dystrophic with subungual debris and pain with palpation to nailbeds due to thickness of nails.  Hyperkeratotic lesion b/l 2nd digits. No erythema, no edema, no drainage, no flocculence noted.   Musculoskeletal: Muscle strength 5/5 to all LE muscle groups  Neurological: Sensation intact  with 10 gram monofilament.  Vibratory sensation intact.  Assessment: 1. Painful onychomycosis toenails 1-5 b/l 2. Corn b/l 2nd digits 3. NIDDM  Plan: 1. Continue diabetic foot care principles. Literature dispensed on today. 2. Toenails 1-5 b/l were debrided in length and girth without iatrogenic bleeding. 3. Hyperkeratotic lesion(s) b/l 2nd digits pared with sterile scalpel blade without incident.  4. Patient to continue soft, supportive shoe gear daily. 5. Patient to report any pedal injuries to medical professional immediately. 6. Follow up 3 months.  7. Patient/POA to call should there be a concern in the interim.

## 2019-07-19 ENCOUNTER — Ambulatory Visit: Payer: Medicare Other | Admitting: Podiatry

## 2019-08-05 ENCOUNTER — Telehealth: Payer: Self-pay | Admitting: Internal Medicine

## 2019-08-05 NOTE — Telephone Encounter (Signed)
Pt called while you were away he was informed he would get a call back.

## 2019-08-05 NOTE — Telephone Encounter (Signed)
I have received FMLA for patient in my box. I have LVM to ask what the request is for and the frequencies he is requesting.   Please transfer the call to [place] at the office. Thank you.

## 2019-08-05 NOTE — Telephone Encounter (Signed)
LVM for patient to call back. ?

## 2019-08-05 NOTE — Telephone Encounter (Signed)
TE on this, please add to that note.

## 2019-08-05 NOTE — Telephone Encounter (Signed)
Patient's wife is calling about his FMLA papers. She would like a call back at [phone]. If you need to speak with patient, just leave a message on the home phone.

## 2019-08-06 NOTE — Telephone Encounter (Signed)
Spoke with patient is requesting the forms to care for Vertie and her health issues. He is requesting 4 to 8 times a month of intermitting leave.   Please advise if this is okay to complete. Thank you.

## 2019-08-06 NOTE — Telephone Encounter (Signed)
Yes, ok 

## 2019-08-08 DIAGNOSIS — Z0279 Encounter for issue of other medical certificate: Secondary | ICD-10-CM

## 2019-08-08 NOTE — Telephone Encounter (Signed)
Forms have been completed & signed, Copy sent to scan [employer] for.   LVM to inform Keland the Original forms have been left up front for him to pick up.

## 2019-08-16 DIAGNOSIS — H25011 Cortical age-related cataract, right eye: Secondary | ICD-10-CM | POA: Diagnosis not present

## 2019-08-16 DIAGNOSIS — H524 Presbyopia: Secondary | ICD-10-CM | POA: Diagnosis not present

## 2019-08-16 DIAGNOSIS — H2513 Age-related nuclear cataract, bilateral: Secondary | ICD-10-CM | POA: Diagnosis not present

## 2019-08-16 LAB — HM DIABETES EYE EXAM

## 2019-08-22 ENCOUNTER — Encounter: Payer: Self-pay | Admitting: Internal Medicine

## 2019-08-30 ENCOUNTER — Other Ambulatory Visit: Payer: Self-pay

## 2019-08-30 ENCOUNTER — Ambulatory Visit (INDEPENDENT_AMBULATORY_CARE_PROVIDER_SITE_OTHER): Payer: Medicare Other | Admitting: Podiatry

## 2019-08-30 ENCOUNTER — Encounter: Payer: Self-pay | Admitting: Podiatry

## 2019-08-30 DIAGNOSIS — E119 Type 2 diabetes mellitus without complications: Secondary | ICD-10-CM

## 2019-08-30 DIAGNOSIS — M79675 Pain in left toe(s): Secondary | ICD-10-CM | POA: Diagnosis not present

## 2019-08-30 DIAGNOSIS — B351 Tinea unguium: Secondary | ICD-10-CM | POA: Diagnosis not present

## 2019-08-30 DIAGNOSIS — L84 Corns and callosities: Secondary | ICD-10-CM

## 2019-08-30 DIAGNOSIS — M79674 Pain in right toe(s): Secondary | ICD-10-CM

## 2019-08-30 LAB — HM DIABETES FOOT EXAM

## 2019-08-30 NOTE — Patient Instructions (Signed)
Diabetes Mellitus and Foot Care Foot care is an important part of your health, especially when you have diabetes. Diabetes may cause you to have problems because of poor blood flow (circulation) to your feet and legs, which can cause your skin to:  Become thinner and drier.  Break more easily.  Heal more slowly.  Peel and crack. You may also have nerve damage (neuropathy) in your legs and feet, causing decreased feeling in them. This means that you may not notice minor injuries to your feet that could lead to more serious problems. Noticing and addressing any potential problems early is the best way to prevent future foot problems. How to care for your feet Foot hygiene  Wash your feet daily with warm water and mild soap. Do not use hot water. Then, pat your feet and the areas between your toes until they are completely dry. Do not soak your feet as this can dry your skin.  Trim your toenails straight across. Do not dig under them or around the cuticle. File the edges of your nails with an emery board or nail file.  Apply a moisturizing lotion or petroleum jelly to the skin on your feet and to dry, brittle toenails. Use lotion that does not contain alcohol and is unscented. Do not apply lotion between your toes. Shoes and socks  Wear clean socks or stockings every day. Make sure they are not too tight. Do not wear knee-high stockings since they may decrease blood flow to your legs.  Wear shoes that fit properly and have enough cushioning. Always look in your shoes before you put them on to be sure there are no objects inside.  To break in new shoes, wear them for just a few hours a day. This prevents injuries on your feet. Wounds, scrapes, corns, and calluses  Check your feet daily for blisters, cuts, bruises, sores, and redness. If you cannot see the bottom of your feet, use a mirror or ask someone for help.  Do not cut corns or calluses or try to remove them with medicine.  If you  find a minor scrape, cut, or break in the skin on your feet, keep it and the skin around it clean and dry. You may clean these areas with mild soap and water. Do not clean the area with peroxide, alcohol, or iodine.  If you have a wound, scrape, corn, or callus on your foot, look at it several times a day to make sure it is healing and not infected. Check for: ? Redness, swelling, or pain. ? Fluid or blood. ? Warmth. ? Pus or a bad smell. General instructions  Do not cross your legs. This may decrease blood flow to your feet.  Do not use heating pads or hot water bottles on your feet. They may burn your skin. If you have lost feeling in your feet or legs, you may not know this is happening until it is too late.  Protect your feet from hot and cold by wearing shoes, such as at the beach or on hot pavement.  Schedule a complete foot exam at least once a year (annually) or more often if you have foot problems. If you have foot problems, report any cuts, sores, or bruises to your health care provider immediately. Contact a health care provider if:  You have a medical condition that increases your risk of infection and you have any cuts, sores, or bruises on your feet.  You have an injury that is not   healing.  You have redness on your legs or feet.  You feel burning or tingling in your legs or feet.  You have pain or cramps in your legs and feet.  Your legs or feet are numb.  Your feet always feel cold.  You have pain around a toenail. Get help right away if:  You have a wound, scrape, corn, or callus on your foot and: ? You have pain, swelling, or redness that gets worse. ? You have fluid or blood coming from the wound, scrape, corn, or callus. ? Your wound, scrape, corn, or callus feels warm to the touch. ? You have pus or a bad smell coming from the wound, scrape, corn, or callus. ? You have a fever. ? You have a red line going up your leg. Summary  Check your feet every day  for cuts, sores, red spots, swelling, and blisters.  Moisturize feet and legs daily.  Wear shoes that fit properly and have enough cushioning.  If you have foot problems, report any cuts, sores, or bruises to your health care provider immediately.  Schedule a complete foot exam at least once a year (annually) or more often if you have foot problems. This information is not intended to replace advice given to you by your health care provider. Make sure you discuss any questions you have with your health care provider. Document Released: 11/25/2000 Document Revised: 01/10/2018 Document Reviewed: 12/30/2016 Elsevier Patient Education  2020 [employer]   Onychomycosis/Fungal Toenails  WHAT IS IT? An infection that lies within the keratin of your nail plate that is caused by a fungus.  WHY ME? Fungal infections affect all ages, sexes, races, and creeds.  There may be many factors that predispose you to a fungal infection such as age, coexisting medical conditions such as diabetes, or an autoimmune disease; stress, medications, fatigue, genetics, etc.  Bottom line: fungus thrives in a warm, moist environment and your shoes offer such a location.  IS IT CONTAGIOUS? Theoretically, yes.  You do not want to share shoes, nail clippers or files with someone who has fungal toenails.  Walking around barefoot in the same room or sleeping in the same bed is unlikely to transfer the organism.  It is important to realize, however, that fungus can spread easily from one nail to the next on the same foot.  HOW DO WE TREAT THIS?  There are several ways to treat this condition.  Treatment may depend on many factors such as age, medications, pregnancy, liver and kidney conditions, etc.  It is best to ask your doctor which options are available to you.  1. No treatment.   Unlike many other medical concerns, you can live with this condition.  However for many people this can be a painful condition and may lead to  ingrown toenails or a bacterial infection.  It is recommended that you keep the nails cut short to help reduce the amount of fungal nail. 2. Topical treatment.  These range from herbal remedies to prescription strength nail lacquers.  About 40-50% effective, topicals require twice daily application for approximately 9 to 12 months or until an entirely new nail has grown out.  The most effective topicals are medical grade medications available through physicians offices. 3. Oral antifungal medications.  With an 80-90% cure rate, the most common oral medication requires 3 to 4 months of therapy and stays in your system for a year as the new nail grows out.  Oral antifungal medications do require   blood work to make sure it is a safe drug for you.  A liver function panel will be performed prior to starting the medication and after the first month of treatment.  It is important to have the blood work performed to avoid any harmful side effects.  In general, this medication safe but blood work is required. 4. Laser Therapy.  This treatment is performed by applying a specialized laser to the affected nail plate.  This therapy is noninvasive, fast, and non-painful.  It is not covered by insurance and is therefore, out of pocket.  The results have been very good with a 80-95% cure rate.  The [facility] is the only practice in the area to offer this therapy. 5. Permanent Nail Avulsion.  Removing the entire nail so that a new nail will not grow back. 

## 2019-09-02 ENCOUNTER — Other Ambulatory Visit: Payer: Self-pay | Admitting: Internal Medicine

## 2019-09-05 NOTE — Progress Notes (Signed)
Subjective: Alfred Foster is seen today for follow up painful, elongated, thickened toenails 1-5 b/l feet that he cannot cut. Pain interferes with daily activities. Aggravating factor includes wearing enclosed shoe gear and relieved with periodic debridement.  He voices no new pedal problems on today's visit.  Current Outpatient Medications on File Prior to Visit  Medication Sig  . amLODipine (NORVASC) 10 MG tablet Take 1 tablet (10 mg total) by mouth daily.  . Ascorbic Acid (VITAMIN C PO) Take by mouth daily.    Marland Kitchen aspirin 81 MG tablet Take 81 mg by mouth daily.  . Cholecalciferol (VITAMIN D3) 1000 UNITS CAPS Take 1,000 Units by mouth daily.  . COD LIVER OIL PO Take by mouth daily.    . Diclofenac Sodium 1.5 % SOLN Use bid  . 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  Dx: [id number]  . folic acid (FOLVITE) Q000111Q MCG tablet Take 800 mcg by mouth daily.   . Lancets (ONETOUCH ULTRASOFT) lancets use as directed  . losartan (COZAAR) 100 MG tablet Take 1 tablet (100 mg total) by mouth daily.  . niacin 500 MG tablet Take 500 mg by mouth once.  . Omega-3 Fatty Acids (FISH OIL PO) Take 1,000 mg by mouth daily.   . ONE TOUCH ULTRA TEST test strip use as directed  . rosuvastatin (CRESTOR) 10 MG tablet TAKE 1 TABLET(10 MG) BY MOUTH DAILY   No current facility-administered medications on file prior to visit.      Allergies  Allergen Reactions  . Hydrochlorothiazide     SOB, dizzy     Objective:  Vascular Examination: Capillary refill time <3 seconds x 10 digits.  Dorsalis pedis present b/l.  Posterior tibial pulses present b/l.  Digital hair absent x 10 digits.  Skin temperature gradient WNL b/l.   Dermatological Examination: Skin with normal turgor, texture and tone b/l.  Toenails 1-5 b/l discolored, thick, dystrophic with subungual debris and pain with palpation to nailbeds due to thickness of nails.  Hyperkeratotic lesion b/l  2nd digits. No erythema, no edema, no drainage, no flocculence noted.  Musculoskeletal: Muscle strength 5/5 to all LE muscle groups b/l.  No gross bony deformities b/l.  No pain, crepitus or joint limitation noted with ROM.   Neurological Examination: Protective sensation intact with 10 gram monofilament bilaterally.  Epicritic sensation present bilaterally.  Vibratory sensation intact bilaterally.   Assessment: Painful onychomycosis toenails 1-5 b/l  Corns b/l 2nd digits NIDDM  Plan: 1. Toenails 1-5 b/l were debrided in length and girth without iatrogenic bleeding. 2. Corns b/l 2nd digits pared utilizing sterile scalpel blade without incident. 3. Patient to continue soft, supportive shoe gear. 4. Patient to report any pedal injuries to medical professional immediately. 5. Follow up 3 months.  6. Patient/POA to call should there be a concern in the interim.

## 2019-09-05 NOTE — Progress Notes (Signed)
Subjective:    Patient ID: Alfred Foster, male    DOB: May 13, 1946, 73 y.o.   MRN: [id number]  HPI The patient is here for an acute visit and follow-up visit.  B/l side pain:  His pain started about two weeks ago. He was laying in bed and he moved and had pain on his left side.  The pain was between his lower ribs and hip.  After a week he felt the same type of pain on the right side when he was up and moving around.  He has to do a lot for his wife - picking up her wheelchair and moving things.  He wondered if he just pulled something.  He denies any pain right now.  With both of the episodes of pain the pain lasted 2-4 hrs and then resolved. He only had it once on the left side and once on the left side.   He has not had it since.  When he had the pain he would feel it more with movements.  He denied any changes in bowels or urinary symptoms.  Hypertension: He is taking his medication daily. He is compliant with a low sodium diet.  He denies chest pain, palpitations, edema, shortness of breath and regular headaches.  He does not monitor his blood pressure at home.    Diabetes: He is taking his medication daily as prescribed. He is compliant with a diabetic diet.  He checks his feet daily and denies foot lesions. He is up-to-date with an ophthalmology examination.   Hyperlipidemia: He is taking his medication daily. He is compliant with a low fat/cholesterol diet. He denies myalgias.     Medications and allergies reviewed with patient and updated if appropriate.  Patient Active Problem List   Diagnosis Date Noted  . Acute deep vein thrombosis (DVT) of calf muscle vein of right lower extremity 09/12/2018  . Carotid artery disease ([place]) 10/21/2017  . Right carotid bruit 09/15/2017  . Left shoulder pain 02/17/2016  . Lipoma of skin of abdomen 03/19/2015  . Nonspecific abnormal electrocardiogram (ECG) (EKG) 03/15/2012  . Essential hypertension 10/22/2010  . PROSTATE CANCER, HX OF  01/23/2009  . Diabetes type 2, controlled ([place]) 08/09/2007  . Hyperlipidemia 05/08/2007    Current Outpatient Medications on File Prior to Visit  Medication Sig Dispense Refill  . amLODipine (NORVASC) 10 MG tablet Take 1 tablet (10 mg total) by mouth daily. 90 tablet 3  . Ascorbic Acid (VITAMIN C PO) Take by mouth daily.      Marland Kitchen aspirin 81 MG tablet Take 81 mg by mouth daily.    . Cholecalciferol (VITAMIN D3) 1000 UNITS CAPS Take 1,000 Units by mouth daily.    . COD LIVER OIL PO Take by mouth daily.      . 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  Dx: [id number] 2 each 0  . folic acid (FOLVITE) Q000111Q MCG tablet Take 800 mcg by mouth daily.     . Lancets (ONETOUCH ULTRASOFT) lancets use as directed 100 each 1  . metoprolol succinate (TOPROL-XL) 25 MG 24 hr tablet TAKE 3 TABLETS(75 MG) BY MOUTH DAILY 270 tablet 0  . niacin 500 MG tablet Take 500 mg by mouth once.    . Omega-3 Fatty Acids (FISH OIL PO) Take 1,000 mg by mouth daily.     . ONE TOUCH ULTRA TEST test strip use as directed 100 each 1  . rosuvastatin (CRESTOR)  10 MG tablet TAKE 1 TABLET(10 MG) BY MOUTH DAILY 90 tablet 1   No current facility-administered medications on file prior to visit.     Past Medical History:  Diagnosis Date  . Diabetes mellitus   . Hyperlipidemia   . Hypertension   . Prostate cancer [facility]) 2007   Dr Alinda Money    Past Surgical History:  Procedure Laterality Date  . COLONOSCOPY  2004 & 2014   Negative ;[facility]  . INGUINAL HERNIA REPAIR    . PROSTATECTOMY  04/2006   Dr Alinda Money  . UMBILICAL HERNIA REPAIR      Social History   Socioeconomic History  . Marital status: Married    Spouse name: Not on file  . Number of children: Not on file  . Years of education: Not on file  . Highest education level: Not on file  Occupational History  . Not on file  Social Needs  . Financial resource strain: Not on file  . Food insecurity    Worry: Not on  file    Inability: Not on file  . Transportation needs    Medical: Not on file    Non-medical: Not on file  Tobacco Use  . Smoking status: Never Smoker  . Smokeless tobacco: Never Used  Substance and Sexual Activity  . Alcohol use: No  . Drug use: No  . Sexual activity: Not on file  Lifestyle  . Physical activity    Days per week: Not on file    Minutes per session: Not on file  . Stress: Not on file  Relationships  . Social Herbalist on phone: Not on file    Gets together: Not on file    Attends religious service: Not on file    Active member of club or organization: Not on file    Attends meetings of clubs or organizations: Not on file    Relationship status: Not on file  Other Topics Concern  . Not on file  Social History Narrative  . Not on file    Family History  Problem Relation Age of Onset  . Diabetes Father   . Hypertension Father   . Alzheimer's disease Father   . Hypertension Mother   . Alzheimer's disease Mother   . Heart attack Mother 3  . Diabetes Brother   . Heart disease Brother   . Heart attack Maternal Aunt        in 53s  . Stroke Neg Hx     Review of Systems  Constitutional: Negative for chills and fever.  Respiratory: Negative for cough, shortness of breath and wheezing.   Cardiovascular: Negative for chest pain, palpitations and leg swelling.  Gastrointestinal: Negative for abdominal pain, blood in stool, constipation, diarrhea and nausea.  Genitourinary: Negative for dysuria, frequency and hematuria.  Musculoskeletal: Negative for back pain and myalgias.  Neurological: Negative for light-headedness, numbness and headaches.       Objective:   Vitals:   09/06/19 0745  BP: (!) 154/94  Pulse: 65  Resp: 16  Temp: 98.5 F (36.9 C)  SpO2: 99%   BP Readings from Last 3 Encounters:  09/06/19 (!) 154/94  12/14/18 (!) 168/94  09/19/18 (!) 148/92   Wt Readings from Last 3 Encounters:  09/06/19 225 lb (102.1 kg)  12/14/18  232 lb (105.2 kg)  09/19/18 234 lb (106.1 kg)   Body mass index is 29.69 kg/m.   Physical Exam    Constitutional: Appears well-developed and well-nourished.  No distress.  HENT:  Head: Normocephalic and atraumatic.  Neck: Neck supple. No tracheal deviation present. No thyromegaly present.  No cervical lymphadenopathy Cardiovascular: Normal rate, regular rhythm and normal heart sounds.   No murmur heard. No carotid bruit .  No edema Pulmonary/Chest: Effort normal and breath sounds normal. No respiratory distress. No has no wheezes. No rales. Msk:   no back in lower back or thoracic spine, no flank pain Skin: Skin is warm and dry. Not diaphoretic.  Psychiatric: Normal mood and affect. Behavior is normal.       Assessment & Plan:    See Problem List for Assessment and Plan of chronic medical problems.

## 2019-09-06 ENCOUNTER — Ambulatory Visit (INDEPENDENT_AMBULATORY_CARE_PROVIDER_SITE_OTHER): Payer: Medicare Other | Admitting: Internal Medicine

## 2019-09-06 ENCOUNTER — Ambulatory Visit: Payer: Medicare Other

## 2019-09-06 ENCOUNTER — Other Ambulatory Visit (INDEPENDENT_AMBULATORY_CARE_PROVIDER_SITE_OTHER): Payer: Medicare Other

## 2019-09-06 ENCOUNTER — Other Ambulatory Visit: Payer: Self-pay | Admitting: Internal Medicine

## 2019-09-06 ENCOUNTER — Other Ambulatory Visit: Payer: Self-pay

## 2019-09-06 ENCOUNTER — Encounter: Payer: Self-pay | Admitting: Internal Medicine

## 2019-09-06 VITALS — BP 154/94 | HR 65 | Temp 98.5°F | Resp 16 | Ht 73.0 in | Wt 225.0 lb

## 2019-09-06 DIAGNOSIS — R109 Unspecified abdominal pain: Secondary | ICD-10-CM | POA: Diagnosis not present

## 2019-09-06 DIAGNOSIS — E782 Mixed hyperlipidemia: Secondary | ICD-10-CM | POA: Diagnosis not present

## 2019-09-06 DIAGNOSIS — E119 Type 2 diabetes mellitus without complications: Secondary | ICD-10-CM | POA: Diagnosis not present

## 2019-09-06 DIAGNOSIS — I10 Essential (primary) hypertension: Secondary | ICD-10-CM | POA: Diagnosis not present

## 2019-09-06 DIAGNOSIS — Z23 Encounter for immunization: Secondary | ICD-10-CM | POA: Diagnosis not present

## 2019-09-06 LAB — COMPREHENSIVE METABOLIC PANEL
ALT: 29 U/L (ref 0–53)
AST: 27 U/L (ref 0–37)
Albumin: 4.1 g/dL (ref 3.5–5.2)
Alkaline Phosphatase: 79 U/L (ref 39–117)
BUN: 18 mg/dL (ref 6–23)
CO2: 24 mEq/L (ref 19–32)
Calcium: 9.4 mg/dL (ref 8.4–10.5)
Chloride: 108 mEq/L (ref 96–112)
Creatinine, Ser: 1.28 mg/dL (ref 0.40–1.50)
GFR: 66.81 mL/min (ref >60.00)
Glucose, Bld: 132 mg/dL — ABNORMAL HIGH (ref 70–99)
Potassium: 4.1 mEq/L (ref 3.5–5.1)
Sodium: 140 mEq/L (ref 135–145)
Total Bilirubin: 0.7 mg/dL (ref 0.2–1.2)
Total Protein: 7.1 g/dL (ref 6.0–8.3)

## 2019-09-06 LAB — LIPID PANEL
Cholesterol: 106 mg/dL (ref 0–200)
HDL: 37.6 mg/dL — ABNORMAL LOW (ref >39.00)
LDL Cholesterol: 59 mg/dL (ref 0–99)
NonHDL: 67.94
Total CHOL/HDL Ratio: 3
Triglycerides: 44 mg/dL (ref 0.0–149.0)
VLDL: 8.8 mg/dL (ref 0.0–40.0)

## 2019-09-06 LAB — HEMOGLOBIN A1C: Hgb A1c MFr Bld: 7.3 % — ABNORMAL HIGH (ref 4.6–6.5)

## 2019-09-06 MED ORDER — METFORMIN HCL 500 MG PO TABS: 500.0000 mg | ORAL_TABLET | Freq: Two times a day (BID) | ORAL | 1 refills | Status: DC

## 2019-09-06 MED ORDER — LOSARTAN POTASSIUM 100 MG PO TABS: 100.0000 mg | ORAL_TABLET | Freq: Every day | ORAL | 3 refills | Status: DC

## 2019-09-06 MED ORDER — SPIRONOLACTONE 25 MG PO TABS: 25.0000 mg | ORAL_TABLET | Freq: Every day | ORAL | 5 refills | Status: DC

## 2019-09-06 NOTE — Assessment & Plan Note (Signed)
He had 2 episodes of side pain-1 on his left side and one on his right side.  The pain was located between the lateral lower ribs and his hip. Pain sounds musculoskeletal in nature.  He has not had recurrence of the pain and denies any pain now He likely strained something more had referred pain from his back He does a lot of lifting to help his wife and that may have been the cause No concerning symptoms and currently not with any pain so we will not evaluate further

## 2019-09-06 NOTE — Assessment & Plan Note (Signed)
Blood pressure poorly controlled Continue current medications Did not tolerate hydrochlorothiazide, will try spironolactone 25 mg daily.  Discussed that he may have dizziness with this and if he does we will stop it If he does not tolerate the spironolactone we will consider hydralazine Advised him to start monitoring his BP at home BMP Follow-up in 1 month to recheck blood pressure and BMP

## 2019-09-06 NOTE — Assessment & Plan Note (Signed)
Diet controlled A1c today Advised more regular exercise Encouraged weight loss-he has lost some

## 2019-09-06 NOTE — Assessment & Plan Note (Signed)
Check lipid panel  Continue daily statin Regular exercise and healthy diet encouraged  

## 2019-09-06 NOTE — Patient Instructions (Addendum)
  Tests ordered today. Your results will be released to [place] (or called to you) after review.  If any changes need to be made, you will be notified at that same time.  Flu immunization administered today.    Medications reviewed and updated.  Changes include :   Start spironolactone 25 mg daily  Your prescription(s) have been submitted to your pharmacy. Please take as directed and contact our office if you believe you are having problem(s) with the medication(s).   Please followup in 1 month

## 2019-09-08 ENCOUNTER — Other Ambulatory Visit: Payer: Self-pay | Admitting: Internal Medicine

## 2019-09-09 ENCOUNTER — Telehealth: Payer: Self-pay

## 2019-09-09 NOTE — Telephone Encounter (Signed)
Pts wife would like to know if a muscle relaxer of some sort could be sent in for pts back pain.

## 2019-09-09 NOTE — Telephone Encounter (Signed)
Did the pain come back?  When I saw him the pain had resolved.  We can try a muscle relaxer if needed, but he should only take it when needed.  Possible side effects include drowsiness.

## 2019-09-10 MED ORDER — TIZANIDINE HCL 2 MG PO TABS: 2.0000 mg | ORAL_TABLET | Freq: Three times a day (TID) | ORAL | 0 refills | Status: DC | PRN

## 2019-09-10 NOTE — Addendum Note (Signed)
Addended by: Binnie Rail on: 09/10/2019 04:24 PM   Modules accepted: Orders

## 2019-09-10 NOTE — Telephone Encounter (Signed)
LVM for wife to call back in regards.

## 2019-09-10 NOTE — Telephone Encounter (Signed)
Would like to do muscle relaxer as needed.

## 2019-10-01 ENCOUNTER — Telehealth: Payer: Self-pay | Admitting: Internal Medicine

## 2019-10-01 MED ORDER — SITAGLIPTIN PHOSPHATE 100 MG PO TABS: 100.0000 mg | ORAL_TABLET | Freq: Every day | ORAL | 5 refills | Status: DC

## 2019-10-01 NOTE — Telephone Encounter (Signed)
LVM letting pt know.  

## 2019-10-01 NOTE — Telephone Encounter (Signed)
Please advise on alternative.  

## 2019-10-01 NOTE — Telephone Encounter (Signed)
metFORMIN (GLUCOPHAGE) 500 MG tablet [id number]  Just written script on 9/25 states that husband did not take this am, is making him feel very full and uncomfortable every time he takes, states not agreeing with him, pls have Burn's nurse leave a message on his home phone as he is at work, needs to have another alternative [phone]

## 2019-10-01 NOTE — Telephone Encounter (Signed)
Stop metformin.  januvia 100 mg sent to pharmacy.  This is not as strong as the metformin, so he may need an additional medication, but lets see how he does with this.

## 2019-10-03 NOTE — Progress Notes (Signed)
Subjective:    Patient ID: Alfred Foster, male    DOB: 08-01-1946, 73 y.o.   MRN: [id number]  HPI The patient is here for follow up.  He is very active, but not exercising regularly.    Hypertension: He is taking his medication daily, but states he is only taking 2 or 3 BP medications and he has 4 on his list. He is compliant with a low sodium diet.  He has lost weight.  He denies chest pain, palpitations, edema, shortness of breath and regular headaches.     Diabetes: he was not tolerating metformin so we stopped and started on januvia.  He is not taking his [place] daily - he has to pick it up from the pharmacy.  He is compliant with a diabetic diet.  He monitors his sugars and they have been running 97-110 in morning, 115-125 in afternoon.    Medications and allergies reviewed with patient and updated if appropriate.  Patient Active Problem List   Diagnosis Date Noted  . Side pain 09/06/2019  . Acute deep vein thrombosis (DVT) of calf muscle vein of right lower extremity ([place]) 09/12/2018  . Carotid artery disease ([place]) 10/21/2017  . Right carotid bruit 09/15/2017  . Left shoulder pain 02/17/2016  . Lipoma of skin of abdomen 03/19/2015  . Nonspecific abnormal electrocardiogram (ECG) (EKG) 03/15/2012  . Essential hypertension 10/22/2010  . PROSTATE CANCER, HX OF 01/23/2009  . Diabetes type 2, controlled ([place]) 08/09/2007  . Hyperlipidemia 05/08/2007    Current Outpatient Medications on File Prior to Visit  Medication Sig Dispense Refill  . Ascorbic Acid (VITAMIN C PO) Take by mouth daily.      Marland Kitchen aspirin 81 MG tablet Take 81 mg by mouth daily.    . Cholecalciferol (VITAMIN D3) 1000 UNITS CAPS Take 1,000 Units by mouth daily.    . COD LIVER OIL PO Take by mouth daily.      . 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  Dx: [id number] 2 each 0  . folic acid (FOLVITE) [id number] MCG tablet Take 800 mcg by mouth daily.     . Lancets  (ONETOUCH ULTRASOFT) lancets use as directed 100 each 1  . losartan (COZAAR) 100 MG tablet Take 1 tablet (100 mg total) by mouth daily. 90 tablet 3  . metoprolol succinate (TOPROL-XL) 25 MG 24 hr tablet TAKE 3 TABLETS(75 MG) BY MOUTH DAILY 270 tablet 0  . niacin 500 MG tablet Take 500 mg by mouth once.    . Omega-3 Fatty Acids (FISH OIL PO) Take 1,000 mg by mouth daily.     . ONE TOUCH ULTRA TEST test strip use as directed 100 each 1  . rosuvastatin (CRESTOR) 10 MG tablet TAKE 1 TABLET(10 MG) BY MOUTH DAILY 90 tablet 1  . sitaGLIPtin (JANUVIA) 100 MG tablet Take 1 tablet (100 mg total) by mouth daily. 30 tablet 5  . spironolactone (ALDACTONE) 25 MG tablet Take 1 tablet (25 mg total) by mouth daily. 30 tablet 5  . tiZANidine (ZANAFLEX) 2 MG tablet Take 1 tablet (2 mg total) by mouth every 8 (eight) hours as needed for muscle spasms. 20 tablet 0   No current facility-administered medications on file prior to visit.     Past Medical History:  Diagnosis Date  . Diabetes mellitus   . Hyperlipidemia   . Hypertension   . Prostate cancer [facility]) 2007   Dr Alinda Money    Past  Surgical History:  Procedure Laterality Date  . COLONOSCOPY  2004 & 2014   Negative ;[facility]  . INGUINAL HERNIA REPAIR    . PROSTATECTOMY  04/2006   Dr Alinda Money  . UMBILICAL HERNIA REPAIR      Social History   Socioeconomic History  . Marital status: Married    Spouse name: Not on file  . Number of children: Not on file  . Years of education: Not on file  . Highest education level: Not on file  Occupational History  . Not on file  Social Needs  . Financial resource strain: Not on file  . Food insecurity    Worry: Not on file    Inability: Not on file  . Transportation needs    Medical: Not on file    Non-medical: Not on file  Tobacco Use  . Smoking status: Never Smoker  . Smokeless tobacco: Never Used  Substance and Sexual Activity  . Alcohol use: No  . Drug use: No  . Sexual activity: Not on file   Lifestyle  . Physical activity    Days per week: Not on file    Minutes per session: Not on file  . Stress: Not on file  Relationships  . Social Herbalist on phone: Not on file    Gets together: Not on file    Attends religious service: Not on file    Active member of club or organization: Not on file    Attends meetings of clubs or organizations: Not on file    Relationship status: Not on file  Other Topics Concern  . Not on file  Social History Narrative  . Not on file    Family History  Problem Relation Age of Onset  . Diabetes Father   . Hypertension Father   . Alzheimer's disease Father   . Hypertension Mother   . Alzheimer's disease Mother   . Heart attack Mother 40  . Diabetes Brother   . Heart disease Brother   . Heart attack Maternal Aunt        in 33s  . Stroke Neg Hx     Review of Systems  Constitutional: Negative for fever.  Respiratory: Negative for shortness of breath.   Cardiovascular: Negative for chest pain, palpitations and leg swelling.  Neurological: Negative for dizziness, light-headedness and headaches.       Objective:   Vitals:   10/04/19 0800  BP: (!) 156/100  Pulse: 66  Resp: 16  Temp: 98 F (36.7 C)  SpO2: 99%   BP Readings from Last 3 Encounters:  10/04/19 (!) 156/100  09/06/19 (!) 154/94  12/14/18 (!) 168/94   Wt Readings from Last 3 Encounters:  10/04/19 217 lb (98.4 kg)  09/06/19 225 lb (102.1 kg)  12/14/18 232 lb (105.2 kg)   Body mass index is 28.63 kg/m.   Physical Exam    Constitutional: Appears well-developed and well-nourished. No distress.  HENT:  Head: Normocephalic and atraumatic.  Neck: Neck supple. No tracheal deviation present. No thyromegaly present.  No cervical lymphadenopathy Cardiovascular: Normal rate, regular rhythm and normal heart sounds.   No murmur heard. No carotid bruit .  No edema Pulmonary/Chest: Effort normal and breath sounds normal. No respiratory distress. No has no  wheezes. No rales.  Skin: Skin is warm and dry. Not diaphoretic.  Psychiatric: Normal mood and affect. Behavior is normal.      Assessment & Plan:    See Problem List for Assessment  and Plan of chronic medical problems.

## 2019-10-04 ENCOUNTER — Other Ambulatory Visit: Payer: Self-pay

## 2019-10-04 ENCOUNTER — Encounter: Payer: Self-pay | Admitting: Internal Medicine

## 2019-10-04 ENCOUNTER — Ambulatory Visit (INDEPENDENT_AMBULATORY_CARE_PROVIDER_SITE_OTHER): Payer: Medicare Other | Admitting: Internal Medicine

## 2019-10-04 ENCOUNTER — Other Ambulatory Visit (INDEPENDENT_AMBULATORY_CARE_PROVIDER_SITE_OTHER): Payer: Medicare Other

## 2019-10-04 VITALS — BP 156/100 | HR 66 | Temp 98.0°F | Resp 16 | Ht 73.0 in | Wt 217.0 lb

## 2019-10-04 DIAGNOSIS — I10 Essential (primary) hypertension: Secondary | ICD-10-CM

## 2019-10-04 DIAGNOSIS — E119 Type 2 diabetes mellitus without complications: Secondary | ICD-10-CM

## 2019-10-04 LAB — BASIC METABOLIC PANEL
BUN: 19 mg/dL (ref 6–23)
CO2: 23 mEq/L (ref 19–32)
Calcium: 9.3 mg/dL (ref 8.4–10.5)
Chloride: 110 mEq/L (ref 96–112)
Creatinine, Ser: 1.32 mg/dL (ref 0.40–1.50)
GFR: 64.47 mL/min (ref >60.00)
Glucose, Bld: 134 mg/dL — ABNORMAL HIGH (ref 70–99)
Potassium: 3.8 mEq/L (ref 3.5–5.1)
Sodium: 140 mEq/L (ref 135–145)

## 2019-10-04 MED ORDER — AMLODIPINE BESYLATE 10 MG PO TABS: 10.0000 mg | ORAL_TABLET | Freq: Every day | ORAL | 3 refills | Status: DC

## 2019-10-04 NOTE — Assessment & Plan Note (Signed)
BP not controlled  There is a lot of confusion about what medication he is taking - does not appear to be taking amlodipine.   Restart amlodipine Continue other medications Monitor BP at home

## 2019-10-04 NOTE — Assessment & Plan Note (Signed)
Sugars controlled at home He will pick up the [place] if covered F/u in 3 months

## 2019-10-04 NOTE — Patient Instructions (Addendum)
  Tests ordered today. Your results will be released to [place] (or called to you) after review.  If any changes need to be made, you will be notified at that same time.   Medications reviewed and updated.  Changes include :   Restart the baby aspirin 81 mg daily,  Start amlodipine 10 mg daily.    Your prescription(s) have been submitted to your pharmacy. Please take as directed and contact our office if you believe you are having problem(s) with the medication(s).    Please followup in 3 months

## 2019-11-29 ENCOUNTER — Encounter: Payer: Self-pay | Admitting: Podiatry

## 2019-11-29 ENCOUNTER — Other Ambulatory Visit: Payer: Self-pay

## 2019-11-29 ENCOUNTER — Ambulatory Visit (INDEPENDENT_AMBULATORY_CARE_PROVIDER_SITE_OTHER): Payer: Medicare Other | Admitting: Podiatry

## 2019-11-29 DIAGNOSIS — M79675 Pain in left toe(s): Secondary | ICD-10-CM | POA: Diagnosis not present

## 2019-11-29 DIAGNOSIS — E119 Type 2 diabetes mellitus without complications: Secondary | ICD-10-CM

## 2019-11-29 DIAGNOSIS — M79674 Pain in right toe(s): Secondary | ICD-10-CM

## 2019-11-29 DIAGNOSIS — B351 Tinea unguium: Secondary | ICD-10-CM | POA: Diagnosis not present

## 2019-11-29 DIAGNOSIS — L84 Corns and callosities: Secondary | ICD-10-CM

## 2019-11-29 LAB — HM DIABETES FOOT EXAM

## 2019-11-29 NOTE — Patient Instructions (Signed)
Diabetes Mellitus and Foot Care Foot care is an important part of your health, especially when you have diabetes. Diabetes may cause you to have problems because of poor blood flow (circulation) to your feet and legs, which can cause your skin to:  Become thinner and drier.  Break more easily.  Heal more slowly.  Peel and crack. You may also have nerve damage (neuropathy) in your legs and feet, causing decreased feeling in them. This means that you may not notice minor injuries to your feet that could lead to more serious problems. Noticing and addressing any potential problems early is the best way to prevent future foot problems. How to care for your feet Foot hygiene  Wash your feet daily with warm water and mild soap. Do not use hot water. Then, pat your feet and the areas between your toes until they are completely dry. Do not soak your feet as this can dry your skin.  Trim your toenails straight across. Do not dig under them or around the cuticle. File the edges of your nails with an emery board or nail file.  Apply a moisturizing lotion or petroleum jelly to the skin on your feet and to dry, brittle toenails. Use lotion that does not contain alcohol and is unscented. Do not apply lotion between your toes. Shoes and socks  Wear clean socks or stockings every day. Make sure they are not too tight. Do not wear knee-high stockings since they may decrease blood flow to your legs.  Wear shoes that fit properly and have enough cushioning. Always look in your shoes before you put them on to be sure there are no objects inside.  To break in new shoes, wear them for just a few hours a day. This prevents injuries on your feet. Wounds, scrapes, corns, and calluses  Check your feet daily for blisters, cuts, bruises, sores, and redness. If you cannot see the bottom of your feet, use a mirror or ask someone for help.  Do not cut corns or calluses or try to remove them with medicine.  If you  find a minor scrape, cut, or break in the skin on your feet, keep it and the skin around it clean and dry. You may clean these areas with mild soap and water. Do not clean the area with peroxide, alcohol, or iodine.  If you have a wound, scrape, corn, or callus on your foot, look at it several times a day to make sure it is healing and not infected. Check for: ? Redness, swelling, or pain. ? Fluid or blood. ? Warmth. ? Pus or a bad smell. General instructions  Do not cross your legs. This may decrease blood flow to your feet.  Do not use heating pads or hot water bottles on your feet. They may burn your skin. If you have lost feeling in your feet or legs, you may not know this is happening until it is too late.  Protect your feet from hot and cold by wearing shoes, such as at the beach or on hot pavement.  Schedule a complete foot exam at least once a year (annually) or more often if you have foot problems. If you have foot problems, report any cuts, sores, or bruises to your health care provider immediately. Contact a health care provider if:  You have a medical condition that increases your risk of infection and you have any cuts, sores, or bruises on your feet.  You have an injury that is not   healing.  You have redness on your legs or feet.  You feel burning or tingling in your legs or feet.  You have pain or cramps in your legs and feet.  Your legs or feet are numb.  Your feet always feel cold.  You have pain around a toenail. Get help right away if:  You have a wound, scrape, corn, or callus on your foot and: ? You have pain, swelling, or redness that gets worse. ? You have fluid or blood coming from the wound, scrape, corn, or callus. ? Your wound, scrape, corn, or callus feels warm to the touch. ? You have pus or a bad smell coming from the wound, scrape, corn, or callus. ? You have a fever. ? You have a red line going up your leg. Summary  Check your feet every day  for cuts, sores, red spots, swelling, and blisters.  Moisturize feet and legs daily.  Wear shoes that fit properly and have enough cushioning.  If you have foot problems, report any cuts, sores, or bruises to your health care provider immediately.  Schedule a complete foot exam at least once a year (annually) or more often if you have foot problems. This information is not intended to replace advice given to you by your health care provider. Make sure you discuss any questions you have with your health care provider. Document Released: 11/25/2000 Document Revised: 01/10/2018 Document Reviewed: 12/30/2016 Elsevier Patient Education  2020 [employer].  

## 2019-12-02 ENCOUNTER — Other Ambulatory Visit: Payer: Self-pay | Admitting: Internal Medicine

## 2019-12-03 ENCOUNTER — Telehealth: Payer: Self-pay | Admitting: Internal Medicine

## 2019-12-03 NOTE — Telephone Encounter (Signed)
Yes, ok 

## 2019-12-03 NOTE — Telephone Encounter (Signed)
Dr.Burns are you okay with this?

## 2019-12-03 NOTE — Telephone Encounter (Signed)
Copied from [place] [id number]. Topic: General - Other >> Dec 03, 2019 10:01 AM Keene Breath wrote: Reason for CRM: Patient called to ask the doctor to give him an extension for FMLA that he has for his wife.  Call to discuss at [phone]

## 2019-12-03 NOTE — Telephone Encounter (Signed)
Spoke with Alfred Foster she is going to have patient call. She said he was getting new paperwork from his HR.

## 2019-12-09 ENCOUNTER — Other Ambulatory Visit: Payer: Self-pay

## 2019-12-09 ENCOUNTER — Ambulatory Visit: Payer: Self-pay | Admitting: *Deleted

## 2019-12-09 ENCOUNTER — Inpatient Hospital Stay (HOSPITAL_COMMUNITY)
Admission: EM | Admit: 2019-12-09 | Discharge: 2019-12-14 | DRG: 177 | Disposition: A | Discharge status: Home / Self Care | Payer: Medicare Other | Attending: Internal Medicine | Admitting: Internal Medicine

## 2019-12-09 ENCOUNTER — Encounter (HOSPITAL_COMMUNITY): Payer: Self-pay | Admitting: *Deleted

## 2019-12-09 ENCOUNTER — Emergency Department (HOSPITAL_COMMUNITY): Payer: Medicare Other

## 2019-12-09 DIAGNOSIS — C61 Malignant neoplasm of prostate: Secondary | ICD-10-CM | POA: Diagnosis present

## 2019-12-09 DIAGNOSIS — Z820 Family history of epilepsy and other diseases of the nervous system: Secondary | ICD-10-CM

## 2019-12-09 DIAGNOSIS — Z86718 Personal history of other venous thrombosis and embolism: Secondary | ICD-10-CM | POA: Diagnosis not present

## 2019-12-09 DIAGNOSIS — I10 Essential (primary) hypertension: Secondary | ICD-10-CM | POA: Diagnosis present

## 2019-12-09 DIAGNOSIS — Z8546 Personal history of malignant neoplasm of prostate: Secondary | ICD-10-CM

## 2019-12-09 DIAGNOSIS — U071 COVID-19: Secondary | ICD-10-CM | POA: Diagnosis not present

## 2019-12-09 DIAGNOSIS — R0602 Shortness of breath: Secondary | ICD-10-CM | POA: Diagnosis not present

## 2019-12-09 DIAGNOSIS — E860 Dehydration: Secondary | ICD-10-CM | POA: Diagnosis present

## 2019-12-09 DIAGNOSIS — N179 Acute kidney failure, unspecified: Secondary | ICD-10-CM | POA: Diagnosis not present

## 2019-12-09 DIAGNOSIS — R079 Chest pain, unspecified: Secondary | ICD-10-CM | POA: Diagnosis not present

## 2019-12-09 DIAGNOSIS — Z833 Family history of diabetes mellitus: Secondary | ICD-10-CM | POA: Diagnosis not present

## 2019-12-09 DIAGNOSIS — Z8249 Family history of ischemic heart disease and other diseases of the circulatory system: Secondary | ICD-10-CM | POA: Diagnosis not present

## 2019-12-09 DIAGNOSIS — Z7984 Long term (current) use of oral hypoglycemic drugs: Secondary | ICD-10-CM | POA: Diagnosis not present

## 2019-12-09 DIAGNOSIS — I491 Atrial premature depolarization: Secondary | ICD-10-CM | POA: Diagnosis not present

## 2019-12-09 DIAGNOSIS — Z7982 Long term (current) use of aspirin: Secondary | ICD-10-CM

## 2019-12-09 DIAGNOSIS — Z79899 Other long term (current) drug therapy: Secondary | ICD-10-CM | POA: Diagnosis not present

## 2019-12-09 DIAGNOSIS — R069 Unspecified abnormalities of breathing: Secondary | ICD-10-CM | POA: Diagnosis not present

## 2019-12-09 DIAGNOSIS — E785 Hyperlipidemia, unspecified: Secondary | ICD-10-CM | POA: Diagnosis not present

## 2019-12-09 DIAGNOSIS — I82531 Chronic embolism and thrombosis of right popliteal vein: Secondary | ICD-10-CM | POA: Diagnosis present

## 2019-12-09 DIAGNOSIS — E872 Acidosis: Secondary | ICD-10-CM | POA: Diagnosis present

## 2019-12-09 DIAGNOSIS — Z743 Need for continuous supervision: Secondary | ICD-10-CM | POA: Diagnosis not present

## 2019-12-09 DIAGNOSIS — J9601 Acute respiratory failure with hypoxia: Secondary | ICD-10-CM | POA: Diagnosis not present

## 2019-12-09 DIAGNOSIS — E119 Type 2 diabetes mellitus without complications: Secondary | ICD-10-CM | POA: Diagnosis present

## 2019-12-09 DIAGNOSIS — R791 Abnormal coagulation profile: Secondary | ICD-10-CM | POA: Diagnosis not present

## 2019-12-09 LAB — HEMOGLOBIN A1C
Hgb A1c MFr Bld: 7.1 % — ABNORMAL HIGH (ref 4.8–5.6)
Mean Plasma Glucose: 157.07 mg/dL

## 2019-12-09 LAB — BASIC METABOLIC PANEL
Anion gap: 11 (ref 5–15)
BUN: 29 mg/dL — ABNORMAL HIGH (ref 8–23)
CO2: 20 mmol/L — ABNORMAL LOW (ref 22–32)
Calcium: 8.6 mg/dL — ABNORMAL LOW (ref 8.9–10.3)
Chloride: 104 mmol/L (ref 98–111)
Creatinine, Ser: 2.03 mg/dL — ABNORMAL HIGH (ref 0.61–1.24)
GFR calc Af Amer: 37 mL/min — ABNORMAL LOW (ref >60)
GFR calc non Af Amer: 32 mL/min — ABNORMAL LOW (ref >60)
Glucose, Bld: 130 mg/dL — ABNORMAL HIGH (ref 70–99)
Potassium: 4.7 mmol/L (ref 3.5–5.1)
Sodium: 135 mmol/L (ref 135–145)

## 2019-12-09 LAB — CBC WITH DIFFERENTIAL/PLATELET
Abs Immature Granulocytes: 0.06 10*3/uL (ref 0.00–0.07)
Basophils Absolute: 0 10*3/uL (ref 0.0–0.1)
Basophils Relative: 0 %
Eosinophils Absolute: 0 10*3/uL (ref 0.0–0.5)
Eosinophils Relative: 0 %
HCT: 45.4 % (ref 39.0–52.0)
Hemoglobin: 15.6 g/dL (ref 13.0–17.0)
Immature Granulocytes: 1 %
Lymphocytes Relative: 30 %
Lymphs Abs: 1.4 10*3/uL (ref 0.7–4.0)
MCH: 32 pg (ref 26.0–34.0)
MCHC: 34.4 g/dL (ref 30.0–36.0)
MCV: 93.2 fL (ref 80.0–100.0)
Monocytes Absolute: 0.9 10*3/uL (ref 0.1–1.0)
Monocytes Relative: 19 %
Neutro Abs: 2.3 10*3/uL (ref 1.7–7.7)
Neutrophils Relative %: 50 %
Platelets: 177 10*3/uL (ref 150–400)
RBC: 4.87 MIL/uL (ref 4.22–5.81)
RDW: 13 % (ref 11.5–15.5)
WBC: 4.5 10*3/uL (ref 4.0–10.5)
nRBC: 0 % (ref 0.0–0.2)

## 2019-12-09 LAB — LACTATE DEHYDROGENASE: LDH: 234 U/L — ABNORMAL HIGH (ref 98–192)

## 2019-12-09 LAB — CREATININE, SERUM
Creatinine, Ser: 1.76 mg/dL — ABNORMAL HIGH (ref 0.61–1.24)
GFR calc Af Amer: 44 mL/min — ABNORMAL LOW (ref >60)
GFR calc non Af Amer: 38 mL/min — ABNORMAL LOW (ref >60)

## 2019-12-09 LAB — CBC
HCT: 41.8 % (ref 39.0–52.0)
Hemoglobin: 14.4 g/dL (ref 13.0–17.0)
MCH: 31.8 pg (ref 26.0–34.0)
MCHC: 34.4 g/dL (ref 30.0–36.0)
MCV: 92.3 fL (ref 80.0–100.0)
Platelets: 164 10*3/uL (ref 150–400)
RBC: 4.53 MIL/uL (ref 4.22–5.81)
RDW: 13 % (ref 11.5–15.5)
WBC: 3.9 10*3/uL — ABNORMAL LOW (ref 4.0–10.5)
nRBC: 0 % (ref 0.0–0.2)

## 2019-12-09 LAB — POC SARS CORONAVIRUS 2 AG -  ED: SARS Coronavirus 2 Ag: POSITIVE — AB

## 2019-12-09 LAB — LIPID PANEL
Cholesterol: 113 mg/dL (ref 0–200)
HDL: 19 mg/dL — ABNORMAL LOW (ref >40)
LDL Cholesterol: 76 mg/dL (ref 0–99)
Total CHOL/HDL Ratio: 5.9 RATIO
Triglycerides: 89 mg/dL (ref <150)
VLDL: 18 mg/dL (ref 0–40)

## 2019-12-09 LAB — ABO/RH: ABO/RH(D): B POS

## 2019-12-09 LAB — C-REACTIVE PROTEIN: CRP: 3 mg/dL — ABNORMAL HIGH (ref <1.0)

## 2019-12-09 LAB — FERRITIN: Ferritin: 596 ng/mL — ABNORMAL HIGH (ref 24–336)

## 2019-12-09 LAB — BRAIN NATRIURETIC PEPTIDE: B Natriuretic Peptide: 20.1 pg/mL (ref 0.0–100.0)

## 2019-12-09 LAB — FIBRINOGEN: Fibrinogen: 432 mg/dL (ref 210–475)

## 2019-12-09 LAB — TROPONIN I (HIGH SENSITIVITY)
Troponin I (High Sensitivity): 9 ng/L (ref <18)
Troponin I (High Sensitivity): 8 ng/L (ref <18)

## 2019-12-09 LAB — CBG MONITORING, ED: Glucose-Capillary: 158 mg/dL — ABNORMAL HIGH (ref 70–99)

## 2019-12-09 LAB — PROCALCITONIN: Procalcitonin: <0.1 ng/mL

## 2019-12-09 LAB — D-DIMER, QUANTITATIVE: D-Dimer, Quant: 6.19 ug/mL-FEU — ABNORMAL HIGH (ref 0.00–0.50)

## 2019-12-09 LAB — HEPATITIS B SURFACE ANTIGEN: Hepatitis B Surface Ag: NONREACTIVE

## 2019-12-09 MED ORDER — ALBUTEROL SULFATE (2.5 MG/3ML) 0.083% IN NEBU: 2.5000 mg | INHALATION_SOLUTION | RESPIRATORY_TRACT | Status: DC | PRN

## 2019-12-09 MED ORDER — ASCORBIC ACID 500 MG PO TABS
500.0000 mg | ORAL_TABLET | Freq: Every day | ORAL | Status: DC
  Administered 2019-12-09 – 2019-12-14 (×6): 500 mg via ORAL
  Filled 2019-12-09 (×5): qty 1

## 2019-12-09 MED ORDER — BENZONATATE 100 MG PO CAPS
100.0000 mg | ORAL_CAPSULE | Freq: Once | ORAL | Status: AC
  Administered 2019-12-09: 100 mg via ORAL
  Filled 2019-12-09: qty 1

## 2019-12-09 MED ORDER — HYDROCOD POLST-CPM POLST ER 10-8 MG/5ML PO SUER: 5.0000 mL | Freq: Two times a day (BID) | ORAL | Status: DC | PRN

## 2019-12-09 MED ORDER — INSULIN ASPART 100 UNIT/ML ~~LOC~~ SOLN
0.0000 [IU] | Freq: Every day | SUBCUTANEOUS | Status: DC
  Administered 2019-12-13: 2 [IU] via SUBCUTANEOUS

## 2019-12-09 MED ORDER — SODIUM CHLORIDE 0.9 % IV SOLN
INTRAVENOUS | Status: DC
  Administered 2019-12-13: 1000 mL via INTRAVENOUS

## 2019-12-09 MED ORDER — AMLODIPINE BESYLATE 10 MG PO TABS
10.0000 mg | ORAL_TABLET | Freq: Every day | ORAL | Status: DC
  Administered 2019-12-09 – 2019-12-14 (×6): 10 mg via ORAL
  Filled 2019-12-09: qty 2
  Filled 2019-12-09 (×5): qty 1

## 2019-12-09 MED ORDER — INSULIN ASPART 100 UNIT/ML ~~LOC~~ SOLN
0.0000 [IU] | Freq: Three times a day (TID) | SUBCUTANEOUS | Status: DC
  Administered 2019-12-09 – 2019-12-10 (×2): 3 [IU] via SUBCUTANEOUS
  Administered 2019-12-10 – 2019-12-11 (×3): 2 [IU] via SUBCUTANEOUS
  Administered 2019-12-12: 3 [IU] via SUBCUTANEOUS
  Administered 2019-12-12: 2 [IU] via SUBCUTANEOUS
  Administered 2019-12-12: 5 [IU] via SUBCUTANEOUS
  Administered 2019-12-13 – 2019-12-14 (×4): 3 [IU] via SUBCUTANEOUS
  Administered 2019-12-14: 2 [IU] via SUBCUTANEOUS

## 2019-12-09 MED ORDER — METOPROLOL SUCCINATE ER 50 MG PO TB24
75.0000 mg | ORAL_TABLET | Freq: Every day | ORAL | Status: DC
  Administered 2019-12-09 – 2019-12-12 (×4): 75 mg via ORAL
  Filled 2019-12-09 (×3): qty 1
  Filled 2019-12-09: qty 3

## 2019-12-09 MED ORDER — SODIUM CHLORIDE 0.9 % IV BOLUS
1000.0000 mL | Freq: Once | INTRAVENOUS | Status: AC
  Administered 2019-12-09: 1000 mL via INTRAVENOUS

## 2019-12-09 MED ORDER — FOLIC ACID 1 MG PO TABS
1.0000 mg | ORAL_TABLET | Freq: Every day | ORAL | Status: DC
  Administered 2019-12-09 – 2019-12-14 (×6): 1 mg via ORAL
  Filled 2019-12-09 (×6): qty 1

## 2019-12-09 MED ORDER — SODIUM CHLORIDE 0.9 % IV SOLN
100.0000 mg | Freq: Every day | INTRAVENOUS | Status: AC
  Administered 2019-12-10 – 2019-12-13 (×4): 100 mg via INTRAVENOUS
  Filled 2019-12-09 (×4): qty 20

## 2019-12-09 MED ORDER — ZINC SULFATE 220 (50 ZN) MG PO CAPS
220.0000 mg | ORAL_CAPSULE | Freq: Every day | ORAL | Status: DC
  Administered 2019-12-09 – 2019-12-14 (×6): 220 mg via ORAL
  Filled 2019-12-09 (×5): qty 1

## 2019-12-09 MED ORDER — ACETAMINOPHEN 500 MG PO TABS
1000.0000 mg | ORAL_TABLET | Freq: Once | ORAL | Status: AC
  Administered 2019-12-09: 1000 mg via ORAL
  Filled 2019-12-09: qty 2

## 2019-12-09 MED ORDER — ENOXAPARIN SODIUM 40 MG/0.4ML ~~LOC~~ SOLN
40.0000 mg | SUBCUTANEOUS | Status: DC
  Administered 2019-12-09: 40 mg via SUBCUTANEOUS
  Filled 2019-12-09: qty 0.4

## 2019-12-09 MED ORDER — ONDANSETRON HCL 4 MG/2ML IJ SOLN: 4.0000 mg | Freq: Four times a day (QID) | INTRAMUSCULAR | Status: DC | PRN

## 2019-12-09 MED ORDER — ONDANSETRON HCL 4 MG PO TABS: 4.0000 mg | ORAL_TABLET | Freq: Four times a day (QID) | ORAL | Status: DC | PRN

## 2019-12-09 MED ORDER — GUAIFENESIN-DM 100-10 MG/5ML PO SYRP: 10.0000 mL | ORAL_SOLUTION | ORAL | Status: DC | PRN

## 2019-12-09 MED ORDER — METHYLPREDNISOLONE SODIUM SUCC 125 MG IJ SOLR
0.5000 mg/kg | Freq: Two times a day (BID) | INTRAMUSCULAR | Status: DC
  Administered 2019-12-09 – 2019-12-14 (×11): 45.625 mg via INTRAVENOUS
  Filled 2019-12-09 (×10): qty 2

## 2019-12-09 MED ORDER — ROSUVASTATIN CALCIUM 5 MG PO TABS
10.0000 mg | ORAL_TABLET | Freq: Every day | ORAL | Status: DC
  Administered 2019-12-09 – 2019-12-14 (×6): 10 mg via ORAL
  Filled 2019-12-09 (×6): qty 2

## 2019-12-09 MED ORDER — SODIUM CHLORIDE 0.9 % IV SOLN
200.0000 mg | Freq: Once | INTRAVENOUS | Status: AC
  Administered 2019-12-09: 200 mg via INTRAVENOUS
  Filled 2019-12-09: qty 200

## 2019-12-09 MED ORDER — ACETAMINOPHEN 325 MG PO TABS: 650.0000 mg | ORAL_TABLET | Freq: Four times a day (QID) | ORAL | Status: DC | PRN

## 2019-12-09 MED ORDER — ASPIRIN 81 MG PO CHEW
81.0000 mg | CHEWABLE_TABLET | Freq: Every day | ORAL | Status: DC
  Administered 2019-12-09 – 2019-12-14 (×6): 81 mg via ORAL
  Filled 2019-12-09 (×6): qty 1

## 2019-12-09 NOTE — ED Triage Notes (Signed)
Patient presents to ED via [facility] states he has been sob with abd. Pain onset 1 week ago, states his wife tested positive for covid 1 week ago. Patient states he was had a 1:00 pm appointment with his pcp however he couldn't wait. C/o decreased appetite .

## 2019-12-09 NOTE — ED Notes (Signed)
RN spoke w/ patient's wife. Pt given [place] sandwich, cheese, crackers, and ginger ale.

## 2019-12-09 NOTE — Telephone Encounter (Signed)
Pt's wife called stating the pt is having all of the symptoms of COVID, esp SOB; pt's wife transferred to [facility]; he sees Dr Quay Burow, [place]; will route to office for notification.   Reason for Disposition . Severe difficulty breathing (e.g., struggling for each breath, speaks in single words)  Answer Assessment - Initial Assessment Questions 1. RESPIRATORY STATUS: "Describe your breathing?" (e.g., wheezing, shortness of breath, unable to speak, severe coughing)      SOB 2. ONSET: "When did this breathing problem begin?"     12/05/2019 3. PATTERN "Does the difficult breathing come and go, or has it been constant since it started?"     constant 4. SEVERITY: "How bad is your breathing?" (e.g., mild, moderate, severe)    - MILD: No SOB at rest, mild SOB with walking, speaks normally in sentences, can lay down, no retractions, pulse < 100.    - MODERATE: SOB at rest, SOB with minimal exertion and prefers to sit, cannot lie down flat, speaks in phrases, mild retractions, audible wheezing, pulse 100-120.    - SEVERE: Very SOB at rest, speaks in single words, struggling to breathe, sitting hunched forward, retractions, pulse > 120      severe 5. RECURRENT SYMPTOM: "Have you had difficulty breathing before?" If so, ask: "When was the last time?" and "What happened that time?"      *No Answer* 6. CARDIAC HISTORY: "Do you have any history of heart disease?" (e.g., heart attack, angina, bypass surgery, angioplasty)      *No Answer* 7. LUNG HISTORY: "Do you have any history of lung disease?"  (e.g., pulmonary embolus, asthma, emphysema)     *No Answer* 8. CAUSE: "What do you think is causing the breathing problem?"      *No Answer* 9. OTHER SYMPTOMS: "Do you have any other symptoms? (e.g., dizziness, runny nose, cough, chest pain, fever)    Coughing, diarrhea, discolaration of skin, chills, weaknes 10. PREGNANCY: "Is there any chance you are pregnant?" "When was your last menstrual  period?"       *No Answer* 11. TRAVEL: "Have you traveled out of the country in the last month?" (e.g., travel history, exposures)       *No Answer*  Protocols used: BREATHING DIFFICULTY-A-AH

## 2019-12-09 NOTE — H&P (Signed)
History and Physical    Alfred Foster [id number] DOB: 1945-12-25 DOA: 12/09/2019  PCP: Binnie Rail, MD  Patient coming from: Home  I have personally briefly reviewed patient's old medical records in [place]  Chief Complaint: Dry cough, shortness of breath, generalized weakness since 1 week  HPI: Alfred Foster is a 73 y.o. male with medical history significant of hypertension, hyperlipidemia, diabetes mellitus, history of prostate cancer presents to emergency department due to worsening dry cough associated with shortness of breath and sometimes wheezing, low-grade fever, chills, generalized weakness, lethargic, decreased appetite, nausea and loose stools.  Patient tells me that his wife and coworker recently tested positive for COVID-19.  He tells me that overall he feels weak and not comfortable going home.  He lives with his wife and denies smoking, alcohol, illicit drug use.  ED Course: Upon arrival: Patient was tachycardic, tachypneic, blood pressure is labile, on 2 L of oxygen via nasal cannula.  Patient is afebrile with no leukocytosis.  COVID-19 positive.  BMP shows AKI.  Chest x-ray shows bibasilar lung opacities concerning for atelectasis/infection.  Review of Systems: As per HPI otherwise negative.    Past Medical History:  Diagnosis Date  . Diabetes mellitus   . Hyperlipidemia   . Hypertension   . Prostate cancer [facility]) 2007   Dr Alinda Money    Past Surgical History:  Procedure Laterality Date  . COLONOSCOPY  2004 & 2014   Negative ;[facility]  . INGUINAL HERNIA REPAIR    . PROSTATECTOMY  04/2006   Dr Alinda Money  . UMBILICAL HERNIA REPAIR       reports that he has never smoked. He has never used smokeless tobacco. He reports that he does not drink alcohol or use drugs.  Allergies  Allergen Reactions  . Hydrochlorothiazide     SOB, dizzy  . Metformin And Related Other (See Comments)    Make him feel full and uncomfortable    Family History    Problem Relation Age of Onset  . Diabetes Father   . Hypertension Father   . Alzheimer's disease Father   . Hypertension Mother   . Alzheimer's disease Mother   . Heart attack Mother 40  . Diabetes Brother   . Heart disease Brother   . Heart attack Maternal Aunt        in 39s  . Stroke Neg Hx     Prior to Admission medications   Medication Sig Start Date End Date Taking? Authorizing Provider  amLODipine (NORVASC) 10 MG tablet Take 1 tablet (10 mg total) by mouth daily. 10/04/19   Binnie Rail, MD  Ascorbic Acid (VITAMIN C PO) Take by mouth daily.      [provider]  aspirin 81 MG tablet Take 81 mg by mouth daily.    [provider]  Cholecalciferol (VITAMIN D3) 1000 UNITS CAPS Take 1,000 Units by mouth daily.    [provider]  COD LIVER OIL PO Take by mouth daily.      [provider]  Elastic Bandages & Supports (MEDICAL COMPRESSION SOCKS) MISC Use daily for leg swelling from DVT.  Dx: I82.461 03/23/19   Binnie Rail, MD  folic acid (FOLVITE) Q000111Q MCG tablet Take 800 mcg by mouth daily.     [provider]  Lancets Glory Rosebush ULTRASOFT) lancets use as directed 11/09/12   Hendricks Limes, MD  losartan (COZAAR) 100 MG tablet Take 1 tablet (100 mg total) by mouth daily. 09/06/19  Binnie Rail, MD  metoprolol succinate (TOPROL-XL) 25 MG 24 hr tablet TAKE 3 TABLETS(75 MG) BY MOUTH DAILY 12/02/19   Binnie Rail, MD  niacin 500 MG tablet Take 500 mg by mouth once.    [provider]  Omega-3 Fatty Acids (FISH OIL PO) Take 1,000 mg by mouth daily.     [provider]  ONE TOUCH ULTRA TEST test strip use as directed 11/09/12   Hendricks Limes, MD  rosuvastatin (CRESTOR) 10 MG tablet TAKE 1 TABLET(10 MG) BY MOUTH DAILY 09/09/19   Binnie Rail, MD  sitaGLIPtin (JANUVIA) 100 MG tablet Take 1 tablet (100 mg total) by mouth daily. 10/01/19   Binnie Rail, MD  spironolactone (ALDACTONE) 25 MG tablet Take 1 tablet (25 mg  total) by mouth daily. 09/06/19   Binnie Rail, MD  tiZANidine (ZANAFLEX) 2 MG tablet Take 1 tablet (2 mg total) by mouth every 8 (eight) hours as needed for muscle spasms. 09/10/19   Binnie Rail, MD    Physical Exam: Vitals:   12/09/19 1315 12/09/19 1330 12/09/19 1345 12/09/19 1400  BP: 139/83 (!) 83/54 (!) 141/94 137/85  Pulse: 63   85  Resp: 17 15 17 18   Temp:      TempSrc:      SpO2: 98%   100%  Weight:      Height:        Constitutional: NAD, calm, comfortable, on 2 L of oxygen via nasal cannula, communicating well. Eyes: PERRL, lids and conjunctivae normal ENMT: Mucous membranes are moist. Posterior pharynx clear of any exudate or lesions.Normal dentition.  Neck: normal, supple, no masses, no thyromegaly Respiratory: clear to auscultation bilaterally, no wheezing, no crackles. Normal respiratory effort. No accessory muscle use.  Cardiovascular: Regular rate and rhythm, no murmurs / rubs / gallops. No extremity edema. 2+ pedal pulses. No carotid bruits.  Abdomen: no tenderness, no masses palpated. No hepatosplenomegaly. Bowel sounds positive.  Musculoskeletal: no clubbing / cyanosis. No joint deformity upper and lower extremities. Good ROM, no contractures. Normal muscle tone.  Skin: no rashes, lesions, ulcers. No induration Neurologic: CN 2-12 grossly intact. Sensation intact, DTR normal. Strength 5/5 in all 4.  Psychiatric: Normal judgment and insight. Alert and oriented x 3. Normal mood.    Labs on Admission: I have personally reviewed following labs and imaging studies  CBC: Recent Labs  Lab 12/09/19 1055  WBC 4.5  NEUTROABS 2.3  HGB 15.6  HCT 45.4  MCV 93.2  PLT [id number]   Basic Metabolic Panel: Recent Labs  Lab 12/09/19 1055  NA 135  K 4.7  CL 104  CO2 20*  GLUCOSE 130*  BUN 29*  CREATININE 2.03*  CALCIUM 8.6*   GFR: Estimated Creatinine Clearance: 37.2 mL/min (A) (by C-G formula based on SCr of 2.03 mg/dL (H)). Liver Function Tests: No results for  input(s): AST, ALT, ALKPHOS, BILITOT, PROT, ALBUMIN in the last 168 hours. No results for input(s): LIPASE, AMYLASE in the last 168 hours. No results for input(s): AMMONIA in the last 168 hours. Coagulation Profile: No results for input(s): INR, PROTIME in the last 168 hours. Cardiac Enzymes: No results for input(s): CKTOTAL, CKMB, CKMBINDEX, TROPONINI in the last 168 hours. BNP (last 3 results) No results for input(s): PROBNP in the last 8760 hours. HbA1C: No results for input(s): HGBA1C in the last 72 hours. CBG: No results for input(s): GLUCAP in the last 168 hours. Lipid Profile: No results for input(s): CHOL, HDL, LDLCALC, TRIG,  CHOLHDL, LDLDIRECT in the last 72 hours. Thyroid Function Tests: No results for input(s): TSH, T4TOTAL, FREET4, T3FREE, THYROIDAB in the last 72 hours. Anemia Panel: No results for input(s): VITAMINB12, FOLATE, FERRITIN, TIBC, IRON, RETICCTPCT in the last 72 hours. Urine analysis: No results found for: COLORURINE, APPEARANCEUR, LABSPEC, PHURINE, GLUCOSEU, HGBUR, BILIRUBINUR, KETONESUR, PROTEINUR, UROBILINOGEN, NITRITE, LEUKOCYTESUR  Radiological Exams on Admission: DG Chest Port 1 View  Result Date: 12/09/2019 CLINICAL DATA:  Shortness of breath, chest pain EXAM: PORTABLE CHEST 1 VIEW COMPARISON:  01/29/2018 FINDINGS: The heart size and mediastinal contours are within normal limits. Subtle patchy bibasilar airspace opacities. No pleural effusion. No pneumothorax. Degenerative changes of the shoulders, left worse than right. IMPRESSION: Subtle patchy bibasilar airspace opacities could reflect atelectasis or developing infection. Electronically Signed   By: Davina Poke D.O.   On: 12/09/2019 11:24    Assessment/Plan Principal Problem:   Acute hypoxemic respiratory failure due to COVID-19 [facility]) Active Problems:   Diabetes mellitus ([place])   Hyperlipidemia   Essential hypertension   Prostate cancer ([place])   AKI (acute kidney injury) ([place])   Acute  hypoxemic respiratory failure due to COVID-19: -Patient presented with cough, shortness of breath, generalized weakness and decreased appetite.  Patient tested positive for COVID-19.  Chest x-ray as above. -He is afebrile with no leukocytosis, currently requiring 2 L of oxygen via nasal cannula. -Admit patient on the floor for close monitoring.  On continuous pulse ox.  Wean off of oxygen as tolerated. -Started on IV Solu-Medrol and consulted pharmacy for remdesivir. -Ordered inflammatory markers-all are pending.  Procalcitonin level and blood culture is obtained and is pending -Started on albuterol breathing treatment every 4 hours as needed for shortness of breath and wheezing, p.o. antitussive, p.o. vitamin C and zinc -Patient was told that if COVID-19 pneumonitis gets worse we might potentially use Actemra off label, he denies any known history of tuberculosis or hepatitis, understands the risk and benefits and wants to proceed with Actemra treatment if required -Ordered incentive spirometry, continue with gentle IV hydration and avoid NSAIDs.  AKI: Secondary to dehydration -Creatinine: 2.03, GFR: 37, BUN: 23 -Start on IV fluids.  Monitor kidney function closely.  Avoid nephrotoxic medication.  Hypertension: Labile -We will continue his home meds-amlodipine and metoprolol, hold losartan and Aldactone due to AKI. -We will continue to monitor his blood pressure closely  Hyperlipidemia: Check lipid panel  -continue statin  Diabetes mellitus: Takes Januvia at home hold for now -Check A1c started on sliding scale insulin monitor blood sugar closely.  History of prostate cancer: Treated in the past  DVT prophylaxis: TED/SCD/Lovenox Code Status: Full code-confirmed with the patient  family Communication: None present at bedside.  Plan of care discussed with patient in length and he verbalized understanding and agreed with it. Disposition Plan: To be determined Consults called:  None Admission status: Inpatient   Mckinley Jewel MD [facility] Pager [phone]  If 7PM-7AM, please contact night-coverage [email or web address] Password [email or web address]  12/09/2019, 2:12 PM

## 2019-12-09 NOTE — ED Notes (Signed)
Dinner tray ordered.

## 2019-12-09 NOTE — ED Notes (Signed)
Notified DR.RAY PATIENT POC COVID 19 POSTIVE . NURSE MYKENZIE WAS NOTIFIED AS WELL

## 2019-12-09 NOTE — ED Notes (Signed)
Pt c/o SOB, placed on 2L [place] for comfort

## 2019-12-09 NOTE — Progress Notes (Signed)
Subjective:  Alfred Foster presents to clinic today for preventative diabetic foot care.  He presents with cc corns bilateral second digits and of  painful, thick, discolored, elongated toenails  of both feet that become tender and patient cannot cut because of thickness. Pain is aggravated when ambulating and wearing enclosed shoe gear and relieved with periodic professional debridement.  He states his wife did not make it to her appointment today because she was not feeling well.  Patient voices no new pedal concerns on today's visit.  Medications reviewed in chart.  Allergies  Allergen Reactions  . Hydrochlorothiazide     SOB, dizzy  . Metformin And Related Other (See Comments)    Make him feel full and uncomfortable    Objective:  Physical Examination:  Vascular Examination: Capillary refill time to digits less than 3 seconds bilaterally.  Palpable DP/PT pulses b/l.  Digital hair absent b/l.   No edema noted b/l.  Skin temperature gradient WNL b/l.  Dermatological Examination: Skin with normal turgor, texture and tone b/l.  No open wounds b/l.  No interdigital macerations noted b/l.  Elongated, thick, discolored brittle toenails with subungual debris and pain on dorsal palpation of nailbeds 1-5 b/l.  Hyperkeratotic lesions distal tip bilateral second digits. No erythema, no edema, no drainage, no flocculence noted.  Musculoskeletal Examination: Muscle strength 5/5 to all muscle groups b/l.   Pes planus foot type bilaterally.  HAV with bunion deformity bilaterally.  No pain, crepitus or joint discomfort with active/passive ROM.  Neurological Examination: Sensation intact 5/5 b/l with 10 gram monofilament.  Vibratory sensation intact b/l.  Proprioceptive sensation intact b/l.  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 No new findings.  No new orders.   2. Toenails 1-5 b/l were debrided in length and girth without iatrogenic laceration. 2.  Continue soft, supportive shoe gear daily. 3.  Report any pedal injuries to medical professional. 4.  Follow up 3 months. 5.  Patient/POA to call should there be a question/concern in there interim.

## 2019-12-09 NOTE — ED Provider Notes (Signed)
[facility] EMERGENCY DEPARTMENT Provider Note   CSN: [id number] Arrival date & time: 12/09/19  1026     History Chief Complaint  Patient presents with  . Shortness of Breath    Alfred Foster is a 73 y.o. male.  The history is provided by the patient. No language interpreter was used.  Shortness of Breath      73 year old male with history of hypertension, diabetes, prostate cancer managed by Dr. Alinda Money presenting to the ED with shortness of breath.  Patient report his wife was diagnosed with COVID-19 little over a week ago as well as several of his coworkers who is been diagnosed with COVID-19.  He report for the past 5 days he has had subjective fever, chills, generalized weakness, recurrent loose stools, loss of taste and smell, crampy abdominal pain as well as shortness of breath.  Symptom has been persistent and has become progressively worse.  Pain is moderate in severity.  He is concerned for COVID-19 infection.  He denies alcohol or tobacco abuse.  He denies any dysuria.  Past Medical History:  Diagnosis Date  . Diabetes mellitus   . Hyperlipidemia   . Hypertension   . Prostate cancer [facility]) 2007   Dr Alinda Money    Patient Active Problem List   Diagnosis Date Noted  . Side pain 09/06/2019  . Acute deep vein thrombosis (DVT) of calf muscle vein of right lower extremity ([place]) 09/12/2018  . Carotid artery disease ([place]) 10/21/2017  . Right carotid bruit 09/15/2017  . Left shoulder pain 02/17/2016  . Lipoma of skin of abdomen 03/19/2015  . Nonspecific abnormal electrocardiogram (ECG) (EKG) 03/15/2012  . Essential hypertension 10/22/2010  . PROSTATE CANCER, HX OF 01/23/2009  . Diabetes type 2, controlled ([place]) 08/09/2007  . Hyperlipidemia 05/08/2007    Past Surgical History:  Procedure Laterality Date  . COLONOSCOPY  2004 & 2014   Negative ;[facility]  . INGUINAL HERNIA REPAIR    . PROSTATECTOMY  04/2006   Dr Alinda Money  . UMBILICAL HERNIA REPAIR          Family History  Problem Relation Age of Onset  . Diabetes Father   . Hypertension Father   . Alzheimer's disease Father   . Hypertension Mother   . Alzheimer's disease Mother   . Heart attack Mother 38  . Diabetes Brother   . Heart disease Brother   . Heart attack Maternal Aunt        in 29s  . Stroke Neg Hx     Social History   Tobacco Use  . Smoking status: Never Smoker  . Smokeless tobacco: Never Used  Substance Use Topics  . Alcohol use: No  . Drug use: No    Home Medications Prior to Admission medications   Medication Sig Start Date End Date Taking? Authorizing Provider  amLODipine (NORVASC) 10 MG tablet Take 1 tablet (10 mg total) by mouth daily. 10/04/19   Binnie Rail, MD  Ascorbic Acid (VITAMIN C PO) Take by mouth daily.      [provider]  aspirin 81 MG tablet Take 81 mg by mouth daily.    [provider]  Cholecalciferol (VITAMIN D3) 1000 UNITS CAPS Take 1,000 Units by mouth daily.    [provider]  COD LIVER OIL PO Take by mouth daily.      [provider]  Elastic Bandages & Supports (MEDICAL COMPRESSION SOCKS) MISC Use daily for leg swelling from DVT.  Dx: [id number] 03/23/19  Binnie Rail, MD  folic acid (FOLVITE) Q000111Q MCG tablet Take 800 mcg by mouth daily.     [provider]  Lancets Glory Rosebush ULTRASOFT) lancets use as directed 11/09/12   Hendricks Limes, MD  losartan (COZAAR) 100 MG tablet Take 1 tablet (100 mg total) by mouth daily. 09/06/19   Binnie Rail, MD  metoprolol succinate (TOPROL-XL) 25 MG 24 hr tablet TAKE 3 TABLETS(75 MG) BY MOUTH DAILY 12/02/19   Binnie Rail, MD  niacin 500 MG tablet Take 500 mg by mouth once.    [provider]  Omega-3 Fatty Acids (FISH OIL PO) Take 1,000 mg by mouth daily.     [provider]  ONE TOUCH ULTRA TEST test strip use as directed 11/09/12   Hendricks Limes, MD  rosuvastatin (CRESTOR) 10 MG tablet TAKE 1 TABLET(10 MG) BY MOUTH  DAILY 09/09/19   Binnie Rail, MD  sitaGLIPtin (JANUVIA) 100 MG tablet Take 1 tablet (100 mg total) by mouth daily. 10/01/19   Binnie Rail, MD  spironolactone (ALDACTONE) 25 MG tablet Take 1 tablet (25 mg total) by mouth daily. 09/06/19   Binnie Rail, MD  tiZANidine (ZANAFLEX) 2 MG tablet Take 1 tablet (2 mg total) by mouth every 8 (eight) hours as needed for muscle spasms. 09/10/19   Binnie Rail, MD    Allergies    Hydrochlorothiazide and Metformin and related  Review of Systems   Review of Systems  Respiratory: Positive for shortness of breath.   All other systems reviewed and are negative.   Physical Exam Updated Vital Signs BP 137/85   Pulse 85   Temp (!) 97.2 F (36.2 C) (Oral)   Resp 18   Ht 6\' 1"  (1.854 m)   Wt 90.7 kg   SpO2 100%   BMI 26.39 kg/m   Physical Exam Vitals and nursing note reviewed.  Constitutional:      General: He is not in acute distress.    Appearance: He is well-developed.  HENT:     Head: Atraumatic.  Eyes:     Conjunctiva/sclera: Conjunctivae normal.  Cardiovascular:     Rate and Rhythm: Tachycardia present.  Pulmonary:     Effort: Pulmonary effort is normal. Tachypnea present.     Breath sounds: Normal breath sounds. No decreased breath sounds, wheezing, rhonchi or rales.  Chest:     Chest wall: No tenderness.  Abdominal:     Palpations: Abdomen is soft.     Tenderness: There is no abdominal tenderness.  Musculoskeletal:     Cervical back: Neck supple.     Right lower leg: No edema.     Left lower leg: No edema.  Skin:    Capillary Refill: Capillary refill takes less than 2 seconds.     Findings: No rash.  Neurological:     Mental Status: He is alert and oriented to person, place, and time.  Psychiatric:        Mood and Affect: Mood normal.     ED Results / Procedures / Treatments   Labs (all labs ordered are listed, but only abnormal results are displayed) Labs Reviewed  BASIC METABOLIC PANEL - Abnormal; Notable  for the following components:      Result Value   CO2 20 (*)    Glucose, Bld 130 (*)    BUN 29 (*)    Creatinine, Ser 2.03 (*)    Calcium 8.6 (*)    GFR calc non Af Wyvonnia Lora  32 (*)    GFR calc Af Amer 37 (*)    All other components within normal limits  POC SARS CORONAVIRUS 2 AG -  ED - Abnormal; Notable for the following components:   SARS Coronavirus 2 Ag POSITIVE (*)    All other components within normal limits  CBC WITH DIFFERENTIAL/PLATELET  BRAIN NATRIURETIC PEPTIDE    EKG None  Radiology DG Chest Port 1 View  Result Date: 12/09/2019 CLINICAL DATA:  Shortness of breath, chest pain EXAM: PORTABLE CHEST 1 VIEW COMPARISON:  01/29/2018 FINDINGS: The heart size and mediastinal contours are within normal limits. Subtle patchy bibasilar airspace opacities. No pleural effusion. No pneumothorax. Degenerative changes of the shoulders, left worse than right. IMPRESSION: Subtle patchy bibasilar airspace opacities could reflect atelectasis or developing infection. Electronically Signed   By: Davina Poke D.O.   On: 12/09/2019 11:24    Procedures Procedures (including critical care time)  Medications Ordered in ED Medications  sodium chloride 0.9 % bolus 1,000 mL (0 mLs Intravenous Stopped 12/09/19 1345)  benzonatate (TESSALON) capsule 100 mg (100 mg Oral Given 12/09/19 1152)  acetaminophen (TYLENOL) tablet 1,000 mg (1,000 mg Oral Given 12/09/19 1152)    ED Course  I have reviewed the triage vital signs and the nursing notes.  Pertinent labs & imaging results that were available during my care of the patient were reviewed by me and considered in my medical decision making (see chart for details).    MDM Rules/Calculators/A&P                      BP 137/85   Pulse 85   Temp (!) 97.2 F (36.2 C) (Oral)   Resp 18   Ht 6\' 1"  (1.854 m)   Wt 90.7 kg   SpO2 100%   BMI 26.39 kg/m   Final Clinical Impression(s) / ED Diagnoses Final diagnoses:  COVID-19 virus infection  AKI  (acute kidney injury) ([place])    Rx / DC Orders ED Discharge Orders    None     10:59 AM Patient here with cold symptoms concerning for COVID-19.  Wife have been diagnosed with COVID-19 as well as his coworker.  Patient appears uncomfortable however he is not hypoxic.  Patient is afebrile.  Work-up initiated.  2:06 PM Patient is currently on 2 L of oxygen satting at 19 presents.  Labs remarkable for evidence of AKI with a creatinine of 2.03 and a BUN of 29.  Normal BNP, normal WBC.  COVID-19 test is positive, chest x-ray shows subtle patchy bibasilar airspace opacity which could reflect atelectasis or developing infection.  Patient given IV fluid.  At this time patient does not feel well enough to go home.  In the setting of age, COVID-10 infection as well as AKI, appreciate [facility] from [facility], Dr. Doristine Bosworth who agrees to see and admit patient for further management.  Alfred Foster was evaluated in Emergency Department on 12/09/2019 for the symptoms described in the history of present illness. He was evaluated in the context of the global COVID-19 pandemic, which necessitated consideration that the patient might be at risk for infection with the SARS-CoV-2 virus that causes COVID-19. Institutional protocols and algorithms that pertain to the evaluation of patients at risk for COVID-19 are in a state of rapid change based on information released by regulatory bodies including the CDC and federal and state organizations. These policies and algorithms were followed during the patient's care in the ED.  Domenic Moras, PA-C 12/09/19 1415    Pattricia Boss, MD 12/10/19 [phone]

## 2019-12-09 NOTE — Patient Outreach (Signed)
[facility]) Care Management  12/09/2019  Alfred Foster 07-31-1946 [id number]   Medication Adherence call to Alfred Foster spoke with Alfred Foster she explain Alfred is in the hospital at this time. Alfred Foster is showing past due on Metformin 500 mg under [place].  [facility] Management Direct Dial [phone]  Fax [phone] [email or web address]

## 2019-12-10 ENCOUNTER — Inpatient Hospital Stay (HOSPITAL_COMMUNITY): Payer: Medicare Other

## 2019-12-10 ENCOUNTER — Encounter (HOSPITAL_COMMUNITY): Payer: Self-pay | Admitting: Internal Medicine

## 2019-12-10 DIAGNOSIS — E119 Type 2 diabetes mellitus without complications: Secondary | ICD-10-CM

## 2019-12-10 DIAGNOSIS — N179 Acute kidney failure, unspecified: Secondary | ICD-10-CM

## 2019-12-10 LAB — CBC WITH DIFFERENTIAL/PLATELET
Abs Immature Granulocytes: 0.03 10*3/uL (ref 0.00–0.07)
Basophils Absolute: 0 10*3/uL (ref 0.0–0.1)
Basophils Relative: 0 %
Eosinophils Absolute: 0 10*3/uL (ref 0.0–0.5)
Eosinophils Relative: 0 %
HCT: 43 % (ref 39.0–52.0)
Hemoglobin: 14.1 g/dL (ref 13.0–17.0)
Immature Granulocytes: 1 %
Lymphocytes Relative: 27 %
Lymphs Abs: 1 10*3/uL (ref 0.7–4.0)
MCH: 31.2 pg (ref 26.0–34.0)
MCHC: 32.8 g/dL (ref 30.0–36.0)
MCV: 95.1 fL (ref 80.0–100.0)
Monocytes Absolute: 0.6 10*3/uL (ref 0.1–1.0)
Monocytes Relative: 17 %
Neutro Abs: 2 10*3/uL (ref 1.7–7.7)
Neutrophils Relative %: 55 %
Platelets: 160 10*3/uL (ref 150–400)
RBC: 4.52 MIL/uL (ref 4.22–5.81)
RDW: 13.2 % (ref 11.5–15.5)
WBC: 3.6 10*3/uL — ABNORMAL LOW (ref 4.0–10.5)
nRBC: 0 % (ref 0.0–0.2)

## 2019-12-10 LAB — COMPREHENSIVE METABOLIC PANEL
ALT: 20 U/L (ref 0–44)
AST: 23 U/L (ref 15–41)
Albumin: 2.8 g/dL — ABNORMAL LOW (ref 3.5–5.0)
Alkaline Phosphatase: 63 U/L (ref 38–126)
Anion gap: 7 (ref 5–15)
BUN: 26 mg/dL — ABNORMAL HIGH (ref 8–23)
CO2: 20 mmol/L — ABNORMAL LOW (ref 22–32)
Calcium: 8.1 mg/dL — ABNORMAL LOW (ref 8.9–10.3)
Chloride: 111 mmol/L (ref 98–111)
Creatinine, Ser: 1.47 mg/dL — ABNORMAL HIGH (ref 0.61–1.24)
GFR calc Af Amer: 54 mL/min — ABNORMAL LOW (ref >60)
GFR calc non Af Amer: 47 mL/min — ABNORMAL LOW (ref >60)
Glucose, Bld: 140 mg/dL — ABNORMAL HIGH (ref 70–99)
Potassium: 4.6 mmol/L (ref 3.5–5.1)
Sodium: 138 mmol/L (ref 135–145)
Total Bilirubin: 0.6 mg/dL (ref 0.3–1.2)
Total Protein: 6.1 g/dL — ABNORMAL LOW (ref 6.5–8.1)

## 2019-12-10 LAB — MAGNESIUM: Magnesium: 2.2 mg/dL (ref 1.7–2.4)

## 2019-12-10 LAB — D-DIMER, QUANTITATIVE: D-Dimer, Quant: 14.55 ug/mL-FEU — ABNORMAL HIGH (ref 0.00–0.50)

## 2019-12-10 LAB — FERRITIN: Ferritin: 594 ng/mL — ABNORMAL HIGH (ref 24–336)

## 2019-12-10 LAB — GLUCOSE, CAPILLARY
Glucose-Capillary: 123 mg/dL — ABNORMAL HIGH (ref 70–99)
Glucose-Capillary: 126 mg/dL — ABNORMAL HIGH (ref 70–99)
Glucose-Capillary: 159 mg/dL — ABNORMAL HIGH (ref 70–99)
Glucose-Capillary: 146 mg/dL — ABNORMAL HIGH (ref 70–99)

## 2019-12-10 LAB — PHOSPHORUS: Phosphorus: 4.2 mg/dL (ref 2.5–4.6)

## 2019-12-10 LAB — C-REACTIVE PROTEIN: CRP: 3.2 mg/dL — ABNORMAL HIGH (ref <1.0)

## 2019-12-10 LAB — HEPARIN LEVEL (UNFRACTIONATED): Heparin Unfractionated: 1.04 IU/mL — ABNORMAL HIGH (ref 0.30–0.70)

## 2019-12-10 MED ORDER — ORAL CARE MOUTH RINSE
15.0000 mL | Freq: Two times a day (BID) | OROMUCOSAL | Status: DC
  Administered 2019-12-10 – 2019-12-14 (×9): 15 mL via OROMUCOSAL

## 2019-12-10 MED ORDER — HEPARIN BOLUS VIA INFUSION
4500.0000 [IU] | Freq: Once | INTRAVENOUS | Status: AC
  Administered 2019-12-10: 4500 [IU] via INTRAVENOUS
  Filled 2019-12-10: qty 4500

## 2019-12-10 MED ORDER — ENSURE ENLIVE PO LIQD
237.0000 mL | Freq: Two times a day (BID) | ORAL | Status: DC
  Administered 2019-12-10 – 2019-12-14 (×7): 237 mL via ORAL

## 2019-12-10 MED ORDER — TECHNETIUM TO 99M ALBUMIN AGGREGATED
1.6000 | Freq: Once | INTRAVENOUS | Status: AC | PRN
  Administered 2019-12-10: 1.6 via INTRAVENOUS

## 2019-12-10 MED ORDER — IPRATROPIUM-ALBUTEROL 20-100 MCG/ACT IN AERS
1.0000 | INHALATION_SPRAY | Freq: Four times a day (QID) | RESPIRATORY_TRACT | Status: DC | PRN
  Filled 2019-12-10: qty 4

## 2019-12-10 MED ORDER — HEPARIN (PORCINE) 25000 UT/250ML-% IV SOLN
800.0000 [IU]/h | INTRAVENOUS | Status: DC
  Administered 2019-12-10: 1300 [IU]/h via INTRAVENOUS
  Administered 2019-12-11: 1000 [IU]/h via INTRAVENOUS
  Filled 2019-12-10 (×3): qty 250

## 2019-12-10 NOTE — ED Notes (Signed)
ED TO INPATIENT HANDOFF REPORT  ED Nurse Name and Phone #:  [phone]  S Name/Age/Gender Alfred Foster 73 y.o. male Room/Bed: 012C/012C  Code Status   Code Status: Full Code  Home/SNF/Other Home Patient oriented to: self, place, time and situation Is this baseline? Yes   Triage Complete: Triage complete  Chief Complaint Acute hypoxemic respiratory failure due to COVID-19 ([place]) [U07.1, J96.01]  Triage Note Patient presents to ED via [facility] states he has been sob with abd. Pain onset 1 week ago, states his wife tested positive for covid 1 week ago. Patient states he was had a 1:00 pm appointment with his pcp however he couldn't wait. C/o decreased appetite .     Allergies Allergies  Allergen Reactions  . Hydrochlorothiazide     SOB, dizzy  . Metformin And Related Other (See Comments)    Make him feel full and uncomfortable    Level of Care/Admitting Diagnosis ED Disposition    ED Disposition Condition [place]: [place] [[id number]]  Level of Care: Med-Surg [16]  Covid Evaluation: Confirmed COVID Positive  Diagnosis: Acute hypoxemic respiratory failure due to COVID-19 [facility]) [[id number]]  Admitting Physician: Mckinley Jewel [[id number]]  Attending Physician: Mckinley Jewel [id number]  Estimated length of stay: 3 - 4 days  Certification:: I certify this patient will need inpatient services for at least 2 midnights       B Medical/Surgery History Past Medical History:  Diagnosis Date  . Diabetes mellitus   . Hyperlipidemia   . Hypertension   . Prostate cancer [facility]) 2007   Dr Alinda Money   Past Surgical History:  Procedure Laterality Date  . COLONOSCOPY  2004 & 2014   Negative ;[facility]  . INGUINAL HERNIA REPAIR    . PROSTATECTOMY  04/2006   Dr Alinda Money  . UMBILICAL HERNIA REPAIR       A IV Location/Drains/Wounds Patient Lines/Drains/Airways Status   Active Line/Drains/Airways    Name:   Placement date:   Placement  time:   Site:   Days:   Peripheral IV 12/09/19 Left Wrist   12/09/19    1047    Wrist   1   Peripheral IV 12/09/19 Right Forearm   12/09/19    1457    Forearm   1          Intake/Output Last 24 hours  Intake/Output Summary (Last 24 hours) at 12/10/2019 0216 Last data filed at 12/09/2019 1636 Gross per 24 hour  Intake 1250 ml  Output --  Net 1250 ml    Labs/Imaging Results for orders placed or performed during the hospital encounter of 12/09/19 (from the past 48 hour(s))  Basic metabolic panel     Status: Abnormal   Collection Time: 12/09/19 10:55 AM  Result Value Ref Range   Sodium 135 135 - 145 mmol/L   Potassium 4.7 3.5 - 5.1 mmol/L   Chloride 104 98 - 111 mmol/L   CO2 20 (L) 22 - 32 mmol/L   Glucose, Bld 130 (H) 70 - 99 mg/dL   BUN 29 (H) 8 - 23 mg/dL   Creatinine, Ser 2.03 (H) 0.61 - 1.24 mg/dL   Calcium 8.6 (L) 8.9 - 10.3 mg/dL   GFR calc non Af Amer 32 (L) >60 mL/min   GFR calc Af Amer 37 (L) >60 mL/min   Anion gap 11 5 - 15    Comment: Performed at [facility], [address],  [place] [address]  CBC with Differential     Status: None   Collection Time: 12/09/19 10:55 AM  Result Value Ref Range   WBC 4.5 4.0 - 10.5 K/uL   RBC 4.87 4.22 - 5.81 MIL/uL   Hemoglobin 15.6 13.0 - 17.0 g/dL   HCT 45.4 39.0 - 52.0 %   MCV 93.2 80.0 - 100.0 fL   MCH 32.0 26.0 - 34.0 pg   MCHC 34.4 30.0 - 36.0 g/dL   RDW 13.0 11.5 - 15.5 %   Platelets 177 150 - 400 K/uL   nRBC 0.0 0.0 - 0.2 %   Neutrophils Relative % 50 %   Neutro Abs 2.3 1.7 - 7.7 K/uL   Lymphocytes Relative 30 %   Lymphs Abs 1.4 0.7 - 4.0 K/uL   Monocytes Relative 19 %   Monocytes Absolute 0.9 0.1 - 1.0 K/uL   Eosinophils Relative 0 %   Eosinophils Absolute 0.0 0.0 - 0.5 K/uL   Basophils Relative 0 %   Basophils Absolute 0.0 0.0 - 0.1 K/uL   Immature Granulocytes 1 %   Abs Immature Granulocytes 0.06 0.00 - 0.07 K/uL   Reactive, Benign Lymphocytes PRESENT     Comment: Performed at [facility], [address] [place], [place] [address]  Brain natriuretic peptide     Status: None   Collection Time: 12/09/19 11:11 AM  Result Value Ref Range   B Natriuretic Peptide 20.1 0.0 - 100.0 pg/mL    Comment: Performed at [facility] [address] [place], [place] [address]  POC SARS Coronavirus 2 Ag-ED - Nasal Swab (BD Veritor Kit)     Status: Abnormal   Collection Time: 12/09/19 11:48 AM  Result Value Ref Range   SARS Coronavirus 2 Ag POSITIVE (A) NEGATIVE    Comment: (NOTE) SARS-CoV-2 antigen PRESENT. Positive results indicate the presence of viral antigens, but clinical correlation with patient history and other diagnostic information is necessary to determine patient infection status.  Positive results do not rule out bacterial infection or co-infection  with other viruses. False positive results are rare but can occur, and confirmatory RT-PCR testing may be appropriate in some circumstances. The expected result is Negative. Fact Sheet for Patients: PodPark.tn Fact Sheet for Providers: GiftContent.is  This test is not yet approved or cleared by the [place] FDA and  has been authorized for detection and/or diagnosis of SARS-CoV-2 by FDA under an Emergency Use Authorization (EUA).  This EUA will remain in effect (meaning this test can be used) for the duration of  the COVID-19 declaration under Section 564(b)(1) of the Act, 21 U.S.C. section 360bbb-3(b)(1), unless the a uthorization is terminated or revoked sooner.   Lipid panel     Status: Abnormal   Collection Time: 12/09/19  2:20 PM  Result Value Ref Range   Cholesterol 113 0 - 200 mg/dL   Triglycerides 89 <150 mg/dL   HDL 19 (L) >40 mg/dL   Total CHOL/HDL Ratio 5.9 RATIO   VLDL 18 0 - 40 mg/dL   LDL Cholesterol 76 0 - 99 mg/dL    Comment:        Total Cholesterol/HDL:CHD Risk Coronary Heart Disease Risk Table                     Men    Women  1/2 Average Risk   3.4   3.3  Average Risk       5.0   4.4  2  X Average Risk   9.6   7.1  3 X Average Risk  23.4   11.0        Use the calculated Patient Ratio above and the CHD Risk Table to determine the patient's CHD Risk.        ATP III CLASSIFICATION (LDL):  <100     mg/dL   Optimal  100-129  mg/dL   Near or Above                    Optimal  130-159  mg/dL   Borderline  160-189  mg/dL   High  >190     mg/dL   Very High Performed at [facility] [address] [place], [place] [address]   Hepatitis B surface antigen     Status: None   Collection Time: 12/09/19  2:48 PM  Result Value Ref Range   Hepatitis B Surface Ag NON REACTIVE NON REACTIVE    Comment: Performed at [facility] [address], [place], [place] [address]  Hemoglobin A1c     Status: Abnormal   Collection Time: 12/09/19  2:48 PM  Result Value Ref Range   Hgb A1c MFr Bld 7.1 (H) 4.8 - 5.6 %    Comment: (NOTE) Pre diabetes:          5.7%-6.4% Diabetes:              >6.4% Glycemic control for   <7.0% adults with diabetes    Mean Plasma Glucose 157.07 mg/dL    Comment: Performed at [facility] [address] [place] [address]  CBC     Status: Abnormal   Collection Time: 12/09/19  2:49 PM  Result Value Ref Range   WBC 3.9 (L) 4.0 - 10.5 K/uL   RBC 4.53 4.22 - 5.81 MIL/uL   Hemoglobin 14.4 13.0 - 17.0 g/dL   HCT 41.8 39.0 - 52.0 %   MCV 92.3 80.0 - 100.0 fL   MCH 31.8 26.0 - 34.0 pg   MCHC 34.4 30.0 - 36.0 g/dL   RDW 13.0 11.5 - 15.5 %   Platelets 164 150 - 400 K/uL   nRBC 0.0 0.0 - 0.2 %    Comment: Performed at [facility], [place] [address], [place], [place] [address]  Creatinine, serum     Status: Abnormal   Collection Time: 12/09/19  2:49 PM  Result Value Ref Range   Creatinine, Ser 1.76 (H) 0.61 - 1.24 mg/dL   GFR calc non Af Amer 38 (L) >60 mL/min   GFR calc Af Amer 44 (L) >60 mL/min    Comment: Performed at [facility] [address], [place], [place] [address]  C-reactive protein     Status: Abnormal   Collection Time: 12/09/19  2:49 PM  Result Value Ref Range   CRP 3.0 (H) <1.0 mg/dL    Comment: Performed at [facility], [place] [address] [place], [place] [address]  D-dimer, quantitative (not at [facility])     Status: Abnormal   Collection Time: 12/09/19  2:49 PM  Result Value Ref Range   D-Dimer, Quant 6.19 (H) 0.00 - 0.50 ug/mL-FEU    Comment: (NOTE) At the manufacturer cut-off of 0.50 ug/mL FEU, this assay has been documented to exclude PE with a sensitivity and negative predictive value of 97 to 99%.  At this time, this assay has not been approved by the FDA to  exclude DVT/VTE. Results should be correlated with clinical presentation. Performed at [facility], [place] [address], [place], [place] [address]   Ferritin     Status: Abnormal   Collection Time: 12/09/19  2:49 PM  Result Value Ref Range   Ferritin 596 (H) 24 - 336 ng/mL    Comment: Performed at [facility], [place] [address], [place], [place] [address]  Fibrinogen     Status: None   Collection Time: 12/09/19  2:49 PM  Result Value Ref Range   Fibrinogen 432 210 - 475 mg/dL    Comment: Performed at [facility] [address], [place], [place] [address]  Lactate dehydrogenase     Status: Abnormal   Collection Time: 12/09/19  2:49 PM  Result Value Ref Range   LDH 234 (H) 98 - 192 U/L    Comment: Performed at [facility], [place] [address] [place],  [address]  Procalcitonin     Status: None   Collection Time: 12/09/19  2:49 PM  Result Value Ref Range   Procalcitonin <0.10 ng/mL    Comment:        Interpretation: PCT (Procalcitonin) <= 0.5 ng/mL: Systemic infection (sepsis) is not likely. Local bacterial infection is possible. (NOTE)       Sepsis PCT Algorithm           Lower Respiratory Tract                                      Infection PCT Algorithm    ----------------------------      ----------------------------         PCT < 0.25 ng/mL                PCT < 0.10 ng/mL         Strongly encourage             Strongly discourage   discontinuation of antibiotics    initiation of antibiotics    ----------------------------     -----------------------------       PCT 0.25 - 0.50 ng/mL            PCT 0.10 - 0.25 ng/mL               OR       >80% decrease in PCT            Discourage initiation of                                            antibiotics      Encourage discontinuation           of antibiotics    ----------------------------     -----------------------------         PCT >= 0.50 ng/mL              PCT 0.26 - 0.50 ng/mL               AND        <80% decrease in PCT             Encourage initiation of  antibiotics       Encourage continuation           of antibiotics    ----------------------------     -----------------------------        PCT >= 0.50 ng/mL                  PCT > 0.50 ng/mL               AND         increase in PCT                  Strongly encourage                                      initiation of antibiotics    Strongly encourage escalation           of antibiotics                                     -----------------------------                                           PCT <= 0.25 ng/mL                                                 OR                                        > 80% decrease in PCT                                     Discontinue / Do not initiate                                             antibiotics Performed at [facility], [address] [place], [place] [address]   Troponin I (High Sensitivity)     Status: None   Collection Time: 12/09/19  2:49 PM  Result Value Ref Range   Troponin I (High Sensitivity) 9 <18 ng/L    Comment: (NOTE) Elevated high sensitivity troponin I (hsTnI) values and significant  changes across serial measurements may suggest ACS but many  other  chronic and acute conditions are known to elevate hsTnI results.  Refer to the "Links" section for chest pain algorithms and additional  guidance. Performed at [facility], [place] [address], [place], [place] [address]   ABO/Rh     Status: None   Collection Time: 12/09/19  3:05 PM  Result Value Ref Range   ABO/RH(D)      B POS Performed at [facility] [address] [place], [place] [address]   Troponin I (High Sensitivity)     Status: None   Collection Time: 12/09/19  4:11 PM  Result Value Ref Range   Troponin I (High Sensitivity) 8 <18 ng/L    Comment: (NOTE) Elevated high sensitivity troponin I (hsTnI) values and significant  changes across serial measurements may suggest ACS but many other  chronic and acute conditions are known to elevate hsTnI results.  Refer to the "Links" section for chest pain algorithms and additional  guidance. Performed at [facility], [place] [address], [place], [place] [address]   CBG monitoring, ED     Status: Abnormal   Collection Time: 12/09/19  7:44 PM  Result Value Ref Range   Glucose-Capillary 158 (H) 70 - 99 mg/dL   DG Chest Port 1 View  Result Date: 12/09/2019 CLINICAL DATA:  Shortness of breath, chest pain EXAM: PORTABLE CHEST 1 VIEW COMPARISON:  01/29/2018 FINDINGS: The heart size and mediastinal contours are within normal limits. Subtle patchy bibasilar airspace opacities. No pleural effusion. No pneumothorax. Degenerative changes of the shoulders, left worse than right. IMPRESSION: Subtle patchy bibasilar airspace opacities could reflect atelectasis or developing infection. Electronically Signed   By: Davina Poke D.O.   On: 12/09/2019 11:24    Pending Labs Unresulted Labs (From admission, onward)    Start     Ordered   12/16/19 0500  Creatinine, serum  (enoxaparin (LOVENOX)    CrCl >/= 30 ml/min)  Weekly,   R    Comments: while on enoxaparin therapy    12/09/19 1411   12/10/19 0500  CBC with  Differential/Platelet  Daily,   R     12/09/19 1411   12/10/19 0500  Comprehensive metabolic panel  Daily,   R     12/09/19 1411   12/10/19 0500  C-reactive protein  Daily,   R     12/09/19 1411   12/10/19 0500  D-dimer, quantitative (not at [facility])  Daily,   R     12/09/19 1411   12/10/19 0500  Ferritin  Daily,   R     12/09/19 1411   12/10/19 0500  Magnesium  Daily,   R     12/09/19 1411   12/10/19 0500  Phosphorus  Daily,   R     12/09/19 1411   12/09/19 1411  Culture, blood (Routine X 2) w Reflex to ID Panel  BLOOD CULTURE X 2,   R (with STAT occurrences)     12/09/19 1411          Vitals/Pain Today's Vitals   12/09/19 2252 12/09/19 2323 12/09/19 2325 12/09/19 2330  BP: 138/78 (!) 143/83  (!) 146/96  Pulse: 82 76  72  Resp: '20 12  19  ' Temp:      TempSrc:      SpO2: 100% 99%  99%  Weight:      Height:      PainSc:   0-No pain     Isolation Precautions Airborne and Contact precautions  Medications Medications  enoxaparin (LOVENOX) injection 40 mg (40 mg Subcutaneous Given 12/09/19 2000)  0.9 %  sodium chloride infusion ( Intravenous New Bag/Given 12/10/19 0210)  remdesivir 200 mg in sodium chloride 0.9% 250 mL IVPB (0 mg Intravenous Stopped 12/09/19 1636)    Followed by  remdesivir 100 mg in sodium chloride 0.9 % 100 mL IVPB (has no administration in time range)  methylPREDNISolone sodium succinate (SOLU-MEDROL) 125 mg/2 mL injection 45.625 mg (45.625 mg Intravenous Given 12/09/19 2225)  guaiFENesin-dextromethorphan (ROBITUSSIN DM) 100-10 MG/5ML syrup 10 mL (has no administration in time range)  chlorpheniramine-HYDROcodone (  TUSSIONEX) 10-8 MG/5ML suspension 5 mL (has no administration in time range)  ascorbic acid (VITAMIN C) tablet 500 mg (500 mg Oral Given 12/09/19 1549)  zinc sulfate capsule 220 mg (220 mg Oral Given 12/09/19 1550)  acetaminophen (TYLENOL) tablet 650 mg (has no administration in time range)  ondansetron (ZOFRAN) tablet 4 mg (has no  administration in time range)    Or  ondansetron (ZOFRAN) injection 4 mg (has no administration in time range)  albuterol (PROVENTIL) (2.5 MG/3ML) 0.083% nebulizer solution 2.5 mg (has no administration in time range)  insulin aspart (novoLOG) injection 0-15 Units (3 Units Subcutaneous Given 12/09/19 2002)  insulin aspart (novoLOG) injection 0-5 Units (0 Units Subcutaneous Not Given 12/09/19 2225)  aspirin chewable tablet 81 mg (81 mg Oral Given 12/09/19 2001)  amLODipine (NORVASC) tablet 10 mg (10 mg Oral Given 12/09/19 2002)  metoprolol succinate (TOPROL-XL) 24 hr tablet 75 mg (75 mg Oral Given 12/09/19 2001)  rosuvastatin (CRESTOR) tablet 10 mg (10 mg Oral Given 87/68/11 5726)  folic acid (FOLVITE) tablet 1 mg (1 mg Oral Given 12/09/19 2001)  sodium chloride 0.9 % bolus 1,000 mL (0 mLs Intravenous Stopped 12/09/19 1345)  benzonatate (TESSALON) capsule 100 mg (100 mg Oral Given 12/09/19 1152)  acetaminophen (TYLENOL) tablet 1,000 mg (1,000 mg Oral Given 12/09/19 1152)    Mobility walks with person assist Low fall risk   Focused Assessments Cardiac Assessment Handoff:  Cardiac Rhythm: Sinus tachycardia No results found for: CKTOTAL, CKMB, CKMBINDEX, TROPONINI Lab Results  Component Value Date   DDIMER 6.19 (H) 12/09/2019   Does the Patient currently have chest pain? No  , Pulmonary Assessment Handoff:  Lung sounds: Bilateral Breath Sounds: Diminished L Breath Sounds: Diminished R Breath Sounds: Diminished O2 Device: Nasal Cannula O2 Flow Rate (L/min): (S) 2 L/min      R Recommendations: See Admitting Provider Note  Report given to:   Additional Notes: -

## 2019-12-10 NOTE — ED Notes (Signed)
Moved pt onto hospital bed for comfort.

## 2019-12-10 NOTE — Progress Notes (Signed)
NURSING PROGRESS NOTE  Alfred Foster [id number] Admission Data: 12/10/2019 5:12 AM Attending Provider: Mckinley Jewel, MD [id number], Claudina Lick, MD Code Status: FULL  Allergies:  Hydrochlorothiazide and Metformin and related Past Medical History:   has a past medical history of Diabetes mellitus, Hyperlipidemia, Hypertension, and Prostate cancer ([place]) (2007). Past Surgical History:   has a past surgical history that includes Inguinal hernia repair; Prostatectomy (04/2006); Colonoscopy (2004 & 123456); and Umbilical hernia repair. Social History:   reports that he has never smoked. He has never used smokeless tobacco. He reports that he does not drink alcohol or use drugs.  Alfred Foster is a 73 y.o. male patient admitted from ED:   Last Documented Vital Signs: Blood pressure (!) 151/95, pulse 71, temperature 97.9 F (36.6 C), temperature source Oral, resp. rate 18, height 6\' 1"  (1.854 m), weight 92 kg, SpO2 95 %.  Cardiac Monitoring: Box # [id number] in place. Cardiac monitor yields:normal sinus rhythm.  IV Fluids:  IV in place, occlusive dsg intact without redness, IV cath wrist left, condition patent and no redness and left, condition patent and no redness and forearm right, condition patent and no redness normal saline.   Skin: WNL, old scar to left knee  Patient/Family orientated to room. Information packet given to patient/family. Admission inpatient armband information verified with patient/family to include name and date of birth and placed on patient arm. Side rails up x 2, fall assessment and education completed with patient/family. Patient/family able to verbalize understanding of risk associated with falls and verbalized understanding to call for assistance before getting out of bed. Call light within reach. Patient/family able to voice and demonstrate understanding of unit orientation instructions.    Will continue to evaluate and treat per MD orders.   Alfred Dyke, RN

## 2019-12-10 NOTE — Progress Notes (Signed)
Initial Nutrition Assessment  RD working remotely.  DOCUMENTATION CODES:   Not applicable  INTERVENTION:   - Liberalize diet to Regular, verbal with readback order placed per MD  - Ensure Enlive po BID, each supplement provides 350 kcal and 20 grams of protein  NUTRITION DIAGNOSIS:   Increased nutrient needs related to acute illness (COVID-19) as evidenced by estimated needs.  GOAL:   Patient will meet greater than or equal to 90% of their needs  MONITOR:   PO intake, Supplement acceptance, Labs, Weight trends  REASON FOR ASSESSMENT:   Malnutrition Screening Tool    ASSESSMENT:   73 year old male who presented to the ED on 12/28 with SOB. PMH of HTN, DM, prostate cancer. COVID-19 test positive. Pt admitted with AKI and chest x-ray shows bibasilar lung opacities concerning for atelectasis/infection.  No meal completions documented since admission. Pt on Heart Healthy/Carb Modified diet. Discussed diet liberalization with MD who agreed. Regular diet order placed.  Spoke with pt via phone call to room. Pt in good spirits and especially pleased because he was able to taste some of the food on his lunch meal tray which is the first time this has happened since last week. Pt states that he ate the chicken, rice, and broccoli. Pt believes his appetite is improving.  Pt states he also ate fairly well at breakfast but did not like the chicken sausage patty. Discussed with pt that his diet has been liberalized. Pt appreciative.  Pt endorses weight loss over the last several months. Pt states his UBW is 230 lbs. Pt states he wasn't eating as much because he didn't have someone cooking for him. Pt also reports he was going to [employer] to get his food. Pt states that several people have noticed his weight loss.  Reviewed weight history in chart. Pt with a 10.1 kg weight loss since 09/06/19. This is a 9.9% weight loss in 3 months which is severe and significant for timeframe. Pt is  at risk for malnutrition and may already meet criteria but RD unable to confirm without NFPE.  Pt willing to consume an oral nutrition supplement during admission. RD will order.  Medications reviewed and include: vitamin C, folic acid, SSI, solu-medrol, zinc sulfate, heparin, remdesivir IVF: NS @ 100 ml/hr  Labs reviewed: BUN 26, creatinine 1.47, CBG's: 123-158  NUTRITION - FOCUSED PHYSICAL EXAM:  Unable to complete at this time. RD working remotely.  Diet Order:   Diet Order            Diet regular Room service appropriate? Yes; Fluid consistency: Thin  Diet effective now               EDUCATION NEEDS:   Education needs have been addressed  Skin:  Skin Assessment: Reviewed RN Assessment  Last BM:  12/10/19 type 7  Height:   Ht Readings from Last 1 Encounters:  12/10/19 6\' 1"  (1.854 m)    Weight:   Wt Readings from Last 1 Encounters:  12/10/19 92 kg    Ideal Body Weight:  83.6 kg  BMI:  Body mass index is 26.76 kg/m.  Estimated Nutritional Needs:   Kcal:  2200-2400  Protein:  105-125 grams  Fluid:  >/= 2.0 L    Gaynell Face, MS, RD, LDN Inpatient Clinical Dietitian Pager: [phone] Weekend/After Hours: [phone]

## 2019-12-10 NOTE — ED Notes (Signed)
Attempted to call report. RN not available.

## 2019-12-10 NOTE — ED Notes (Signed)
Ordered a hospital bed--Bayley Hurn  

## 2019-12-10 NOTE — Progress Notes (Signed)
ANTICOAGULATION CONSULT NOTE - Initial Consult  Pharmacy Consult for Heparin Indication: D-dimer >14 in COVID+, concern for PE  Allergies  Allergen Reactions  . Hydrochlorothiazide     SOB, dizzy  . Metformin And Related Other (See Comments)    Make him feel full and uncomfortable    Patient Measurements: Height: 6\' 1"  (185.4 cm) Weight: 202 lb 13.2 oz (92 kg) IBW/kg (Calculated) : 79.9 Heparin Dosing Weight: TBW  Vital Signs: Temp: 97.8 F (36.6 C) (12/29 0552) Temp Source: Oral (12/29 0552) BP: 150/93 (12/29 0552) Pulse Rate: 77 (12/29 0552)  Labs: Recent Labs    12/09/19 1055 12/09/19 1449 12/09/19 1611 12/10/19 0702  HGB 15.6 14.4  --  14.1  HCT 45.4 41.8  --  43.0  PLT 177 164  --  160  CREATININE 2.03* 1.76*  --  1.47*  TROPONINIHS  --  9 8  --     Estimated Creatinine Clearance: 51.3 mL/min (A) (by C-G formula based on SCr of 1.47 mg/dL (H)).   Medical History: Past Medical History:  Diagnosis Date  . Diabetes mellitus   . Hyperlipidemia   . Hypertension   . Prostate cancer [facility]) 2007   Dr Alinda Money   Assessment: 72 YOM presenting with SOB/cough in setting of COVID.  Now with elevated D-dimer and also concern for PE.  Not on anticoagulation PTA, lovenox 40mg  given >12h ago.      Goal of Therapy:  Heparin level 0.3-0.7 units/ml Monitor platelets by anticoagulation protocol: Yes   Plan:  Heparin 4500 units IV x1, and gtt at 1300 units/hr F/u 8 hour heparin level PE workup  Bertis Ruddy, PharmD Clinical Pharmacist Please check AMION for all [place] numbers 12/10/2019 8:29 AM

## 2019-12-10 NOTE — Progress Notes (Signed)
ANTICOAGULATION CONSULT NOTE - Initial Consult  Pharmacy Consult for Heparin Indication: D-dimer >14 in COVID+, concern for PE  Allergies  Allergen Reactions  . Hydrochlorothiazide     SOB, dizzy  . Metformin And Related Other (See Comments)    Make him feel full and uncomfortable    Patient Measurements: Height: 6\' 1"  (185.4 cm) Weight: 202 lb 13.2 oz (92 kg) IBW/kg (Calculated) : 79.9 Heparin Dosing Weight: TBW  Vital Signs: BP: 128/74 (12/29 1200) Pulse Rate: 70 (12/29 1200)  Labs: Recent Labs    12/09/19 1055 12/09/19 1449 12/09/19 1611 12/10/19 0702 12/10/19 1830  HGB 15.6 14.4  --  14.1  --   HCT 45.4 41.8  --  43.0  --   PLT 177 164  --  160  --   HEPARINUNFRC  --   --   --   --  1.04*  CREATININE 2.03* 1.76*  --  1.47*  --   TROPONINIHS  --  9 8  --   --     Estimated Creatinine Clearance: 51.3 mL/min (A) (by C-G formula based on SCr of 1.47 mg/dL (H)).   Medical History: Past Medical History:  Diagnosis Date  . Diabetes mellitus   . Hyperlipidemia   . Hypertension   . Prostate cancer [facility]) 2007   Dr Alinda Money   Assessment: 41 YOM presenting with SOB/cough in setting of COVID.  Now with elevated D-dimer and also concern for PE.  Not on anticoagulation PTA, lovenox 40mg  given >12h ago.   Initial heparin level elevated at 1.04, cbc was wnl this am. No bleeding noted per Rn.   Goal of Therapy:  Heparin level 0.3-0.7 units/ml Monitor platelets by anticoagulation protocol: Yes   Plan:  Reduce heparin to 1000 units/hr F/u 8 hour heparin level PE workup  Erin Hearing PharmD., BCPS Clinical Pharmacist 12/10/2019 8:41 PM

## 2019-12-11 ENCOUNTER — Inpatient Hospital Stay (HOSPITAL_COMMUNITY): Payer: Medicare Other

## 2019-12-11 DIAGNOSIS — U071 COVID-19: Secondary | ICD-10-CM

## 2019-12-11 DIAGNOSIS — Z86718 Personal history of other venous thrombosis and embolism: Secondary | ICD-10-CM

## 2019-12-11 DIAGNOSIS — R791 Abnormal coagulation profile: Secondary | ICD-10-CM

## 2019-12-11 LAB — COMPREHENSIVE METABOLIC PANEL
ALT: 22 U/L (ref 0–44)
AST: 23 U/L (ref 15–41)
Albumin: 2.8 g/dL — ABNORMAL LOW (ref 3.5–5.0)
Alkaline Phosphatase: 60 U/L (ref 38–126)
Anion gap: 9 (ref 5–15)
BUN: 29 mg/dL — ABNORMAL HIGH (ref 8–23)
CO2: 16 mmol/L — ABNORMAL LOW (ref 22–32)
Calcium: 8.1 mg/dL — ABNORMAL LOW (ref 8.9–10.3)
Chloride: 113 mmol/L — ABNORMAL HIGH (ref 98–111)
Creatinine, Ser: 1.27 mg/dL — ABNORMAL HIGH (ref 0.61–1.24)
GFR calc Af Amer: >60 mL/min (ref >60)
GFR calc non Af Amer: 56 mL/min — ABNORMAL LOW (ref >60)
Glucose, Bld: 151 mg/dL — ABNORMAL HIGH (ref 70–99)
Potassium: 4.3 mmol/L (ref 3.5–5.1)
Sodium: 138 mmol/L (ref 135–145)
Total Bilirubin: 0.7 mg/dL (ref 0.3–1.2)
Total Protein: 5.9 g/dL — ABNORMAL LOW (ref 6.5–8.1)

## 2019-12-11 LAB — CBC WITH DIFFERENTIAL/PLATELET
Abs Immature Granulocytes: 0.06 10*3/uL (ref 0.00–0.07)
Basophils Absolute: 0 10*3/uL (ref 0.0–0.1)
Basophils Relative: 0 %
Eosinophils Absolute: 0 10*3/uL (ref 0.0–0.5)
Eosinophils Relative: 0 %
HCT: 41.6 % (ref 39.0–52.0)
Hemoglobin: 13.9 g/dL (ref 13.0–17.0)
Immature Granulocytes: 1 %
Lymphocytes Relative: 14 %
Lymphs Abs: 1 10*3/uL (ref 0.7–4.0)
MCH: 31.7 pg (ref 26.0–34.0)
MCHC: 33.4 g/dL (ref 30.0–36.0)
MCV: 94.8 fL (ref 80.0–100.0)
Monocytes Absolute: 0.6 10*3/uL (ref 0.1–1.0)
Monocytes Relative: 8 %
Neutro Abs: 5.1 10*3/uL (ref 1.7–7.7)
Neutrophils Relative %: 77 %
Platelets: 196 10*3/uL (ref 150–400)
RBC: 4.39 MIL/uL (ref 4.22–5.81)
RDW: 13.2 % (ref 11.5–15.5)
WBC: 6.7 10*3/uL (ref 4.0–10.5)
nRBC: 0 % (ref 0.0–0.2)

## 2019-12-11 LAB — HEPARIN LEVEL (UNFRACTIONATED)
Heparin Unfractionated: 1.07 IU/mL — ABNORMAL HIGH (ref 0.30–0.70)
Heparin Unfractionated: 1.03 IU/mL — ABNORMAL HIGH (ref 0.30–0.70)
Heparin Unfractionated: 0.45 IU/mL (ref 0.30–0.70)

## 2019-12-11 LAB — GLUCOSE, CAPILLARY
Glucose-Capillary: 128 mg/dL — ABNORMAL HIGH (ref 70–99)
Glucose-Capillary: 104 mg/dL — ABNORMAL HIGH (ref 70–99)
Glucose-Capillary: 163 mg/dL — ABNORMAL HIGH (ref 70–99)

## 2019-12-11 LAB — C-REACTIVE PROTEIN: CRP: 1.3 mg/dL — ABNORMAL HIGH (ref <1.0)

## 2019-12-11 LAB — PHOSPHORUS: Phosphorus: 3.7 mg/dL (ref 2.5–4.6)

## 2019-12-11 LAB — D-DIMER, QUANTITATIVE: D-Dimer, Quant: 13.94 ug/mL-FEU — ABNORMAL HIGH (ref 0.00–0.50)

## 2019-12-11 LAB — PATHOLOGIST SMEAR REVIEW: Path Review: REACTIVE

## 2019-12-11 LAB — MAGNESIUM: Magnesium: 2.2 mg/dL (ref 1.7–2.4)

## 2019-12-11 LAB — FERRITIN: Ferritin: 750 ng/mL — ABNORMAL HIGH (ref 24–336)

## 2019-12-11 NOTE — Progress Notes (Signed)
[place] for Heparin Indication: D-dimer >14 in COVID+, concern for PE  Allergies  Allergen Reactions  . Hydrochlorothiazide     SOB, dizzy  . Metformin And Related Other (See Comments)    Make him feel full and uncomfortable    Patient Measurements: Height: 6\' 1"  (185.4 cm) Weight: 202 lb 13.2 oz (92 kg) IBW/kg (Calculated) : 79.9 Heparin Dosing Weight: TBW  Vital Signs: Temp: 97.2 F (36.2 C) (12/30 0800) Temp Source: Oral (12/30 0800) BP: 147/92 (12/30 0800) Pulse Rate: 60 (12/30 0800)  Labs: Recent Labs    12/09/19 1449 12/09/19 1611 12/10/19 0702 12/10/19 1830 12/11/19 0500 12/11/19 0726  HGB 14.4  --  14.1  --  13.9  --   HCT 41.8  --  43.0  --  41.6  --   PLT 164  --  160  --  196  --   HEPARINUNFRC  --   --   --  1.04* 1.07* 1.03*  CREATININE 1.76*  --  1.47*  --  1.27*  --   TROPONINIHS 9 8  --   --   --   --     Estimated Creatinine Clearance: 59.4 mL/min (A) (by C-G formula based on SCr of 1.27 mg/dL (H)).   Medical History: Past Medical History:  Diagnosis Date  . Diabetes mellitus   . Hyperlipidemia   . Hypertension   . Prostate cancer [facility]) 2007   Dr Alinda Money   Assessment: 79 YOM presenting with SOB/cough in setting of COVID.  Now with elevated D-dimer and also concern for PE.  Not on anticoagulation PTA, lovenox 40mg  given >12h ago.   S/p rate decrease, heparin level re-draw remains elevated, no bleeding reported.    Goal of Therapy:  Heparin level 0.3-0.7 units/ml Monitor platelets by anticoagulation protocol: Yes   Plan:  Decrease heparin gtt to 800 units/hr F/u 8 hour heparin level  Bertis Ruddy, PharmD Clinical Pharmacist Please check AMION for all [place] numbers 12/11/2019 9:53 AM

## 2019-12-11 NOTE — Progress Notes (Signed)
Lower ext venous duplex  has been completed. Refer to [facility] under chart review to view preliminary results.   12/11/2019  3:22 PM Tashyra Adduci, Bonnye Fava

## 2019-12-11 NOTE — Progress Notes (Signed)
PROGRESS NOTE    Alfred Foster  [id number] DOB: [birth year] DOA: 12/09/2019 PCP: Binnie Rail, MD   Brief Narrative: 73 year old with past medical history significant for hypertension, hyperlipidemia, diabetes history of prostate cancer presents to the ED complaining of worsening dry cough and shortness of breath low-grade fever, lethargy loose stool.  Positive Covid contact.  Found to be positive for Covid requiring 2 L of oxygen on admission.  AKI improved with IV fluids.  VQ scan ordered on 1229 with high probability for PE started on heparin drip.    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    2-Probably PE; and chronic DVT right popliteal vein, right femoral vein: Patient VQ scan with high probability for PE elevated D-dimer We will continue with heparin drip for 48 hours. Patient will require long-term anticoagulation due to chronic DVT.  And prior episode of DVT  3-AKI secondary to poor oral intake Baseline creatinine 1.2--1.4 Continue to hold losartan Continue with IV fluids  Hypertension: Continue with amlodipine and metoprolol  Hyperlipidemia continue with statins  Diabetes continue with sliding scale History of DVT in right lower extremity in the past completed anticoagulation Doppler repeated during this admission.  Metabolic acidosis:  Continue with IV fluids.    Nutrition Problem: Increased nutrient needs Etiology: acute illness(COVID-19)    Signs/Symptoms: estimated needs    Interventions: Ensure Enlive (each supplement provides 350kcal and 20 grams of protein), Liberalize Diet  Estimated body mass index is 26.76 kg/m as calculated from the following:   Height as of this encounter: 6\' 1"  (1.854 m).   Weight as of this encounter: 92 kg.   DVT prophylaxis: heparin Gtt Code Status: full ocde Family Communication: care discussed with patient Disposition Plan: remain in the hospital for treatment of presume PE Consultants:   none  Procedures:  VQ scan: Perfusion only study with multiple bilateral segmental perfusion defects without corresponding radiographic abnormality. Findings are high probability for pulmonary embolism.    Antimicrobials:    Subjective: He denies chest pain, dyspnea.   Objective: Vitals:   12/10/19 2129 12/11/19 0553 12/11/19 0800 12/11/19 1449  BP: (!) 142/93 127/80 (!) 147/92   Pulse: 73 70 60   Resp: 20 (!) 24 19   Temp: 97.6 F (36.4 C) 97.6 F (36.4 C) (!) 97.2 F (36.2 C) 98.6 F (37 C)  TempSrc: Oral Oral Oral   SpO2: 93% 96% 96%   Weight:      Height:        Intake/Output Summary (Last 24 hours) at 12/11/2019 1721 Last data filed at 12/11/2019 1715 Gross per 24 hour  Intake 4297.44 ml  Output 800 ml  Net 3497.44 ml   Filed Weights   12/09/19 1043 12/10/19 0300  Weight: 90.7 kg 92 kg    Examination:  General exam: Appears calm and comfortable  Respiratory system: crackles bases Cardiovascular system: S1 & S2 heard, RRR.  No JVD, murmurs, rubs, gallops or clicks. No pedal edema. Gastrointestinal system: Abdomen is nondistended, soft and nontender. No organomegaly or masses felt. Normal bowel sounds heard. Central nervous system: Alert and oriented. No focal neurological deficits. Extremities: Symmetric 5 x 5 power. Skin: No rashes, lesions or  ulcers    Data Reviewed: I have personally reviewed following labs and imaging studies  CBC: Recent Labs  Lab 12/09/19 1055 12/09/19 1449 12/10/19 0702 12/11/19 0500  WBC 4.5 3.9* 3.6* 6.7  NEUTROABS 2.3  --  2.0 5.1  HGB 15.6 14.4 14.1 13.9  HCT 45.4 41.8 43.0 41.6  MCV 93.2 92.3 95.1 94.8  PLT 177 164 160 [id number]   Basic Metabolic Panel: Recent Labs  Lab 12/09/19 1055 12/09/19 1449 12/10/19 0702 12/11/19 0500  NA 135  --  138 138  K 4.7  --  4.6 4.3  CL 104  --  111 113*  CO2 20*  --  20* 16*  GLUCOSE 130*  --  140* 151*  BUN 29*  --  26* 29*  CREATININE 2.03* 1.76* 1.47* 1.27*  CALCIUM 8.6*  --  8.1* 8.1*  MG  --   --  2.2 2.2  PHOS  --   --  4.2 3.7   GFR: Estimated Creatinine Clearance: 59.4 mL/min (A) (by C-G formula based on SCr of 1.27 mg/dL (H)). Liver Function Tests: Recent Labs  Lab 12/10/19 0702 12/11/19 0500  AST 23 23  ALT 20 22  ALKPHOS 63 60  BILITOT 0.6 0.7  PROT 6.1* 5.9*  ALBUMIN 2.8* 2.8*   No results for input(s): LIPASE, AMYLASE in the last 168 hours. No results for input(s): AMMONIA in the last 168 hours. Coagulation Profile: No results for input(s): INR, PROTIME in the last 168 hours. Cardiac Enzymes: No results for input(s): CKTOTAL, CKMB, CKMBINDEX, TROPONINI in the last 168 hours. BNP (last 3 results) No results for input(s): PROBNP in the last 8760 hours. HbA1C: Recent Labs    12/09/19 1448  HGBA1C 7.1*   CBG: Recent Labs  Lab 12/10/19 1244 12/10/19 1731 12/10/19 2111 12/11/19 0756 12/11/19 1148  GLUCAP 126* 159* 146* 128* 104*   Lipid Profile: Recent Labs    12/09/19 1420  CHOL 113  HDL 19*  LDLCALC 76  TRIG 89  CHOLHDL 5.9   Thyroid Function Tests: No results for input(s): TSH, T4TOTAL, FREET4, T3FREE, THYROIDAB in the last 72 hours. Anemia Panel: Recent Labs    12/10/19 0702 12/11/19 0500  FERRITIN 594* 750*   Sepsis Labs: Recent Labs  Lab 12/09/19 1449  PROCALCITON <0.10    Recent Results  (from the past 240 hour(s))  Culture, blood (Routine X 2) w Reflex to ID Panel     Status: None (Preliminary result)   Collection Time: 12/09/19 11:15 AM   Specimen: BLOOD  Result Value Ref Range Status   Specimen Description BLOOD RIGHT ANTECUBITAL  Final   Special Requests   Final    BOTTLES DRAWN AEROBIC AND ANAEROBIC Blood Culture adequate volume   Culture   Final    NO GROWTH 2 DAYS Performed at [facility], [place] [address] [place], [place] [address]    Report Status PENDING  Incomplete  Culture, blood (Routine X 2) w Reflex to ID Panel     Status: None (Preliminary result)   Collection Time: 12/09/19  3:10 PM   Specimen: BLOOD  Result Value Ref Range Status   Specimen Description BLOOD BLOOD RIGHT FOREARM  Final  Special Requests   Final    BOTTLES DRAWN AEROBIC AND ANAEROBIC Blood Culture adequate volume   Culture   Final    NO GROWTH 2 DAYS Performed at [facility], [place] [address] [place], [place] [address]    Report Status PENDING  Incomplete         Radiology Studies: NM Pulmonary Perfusion  Result Date: 12/10/2019 CLINICAL DATA:  Shortness of breath, COVID-19 positive EXAM: NUCLEAR MEDICINE PERFUSION LUNG SCAN TECHNIQUE: Perfusion images were obtained in multiple projections after intravenous injection of radiopharmaceutical. Ventilation scans intentionally deferred if perfusion scan and chest x-ray adequate for interpretation during COVID 19 epidemic. RADIOPHARMACEUTICALS:  1.6 mCi Tc-75m MAA IV COMPARISON:  Chest x-ray 12/09/2019 FINDINGS: There are multiple large segmental perfusion defects within the bilateral lungs. Although no ventilation scan was performed secondary to COVID-19 pandemic protocols, the chest radiograph dated 12/09/2019 shows no matched airspace opacities. IMPRESSION: Perfusion only study with multiple bilateral segmental perfusion defects without corresponding radiographic abnormality. Findings are high probability for pulmonary  embolism. These results will be called to the ordering clinician or representative by the Radiologist Assistant, and communication documented in the PACS or zVision Dashboard. Electronically Signed   By: Davina Poke D.O.   On: 12/10/2019 16:34   VAS [place] LOWER EXTREMITY VENOUS (DVT)  Result Date: 12/11/2019  Lower Venous Study Indications: Positive D dimer with Covid 19. History of right leg DVT on 09/12/18.  Anticoagulation: Lovenox. Performing Technologist: Oda Cogan RDMS, RVT  Examination Guidelines: A complete evaluation includes B-mode imaging, spectral Doppler, color Doppler, and power Doppler as needed of all accessible portions of each vessel. Bilateral testing is considered an integral part of a complete examination. Limited examinations for reoccurring indications may be performed as noted.  +---------+---------------+---------+-----------+----------+--------------+  RIGHT     Compressibility Phasicity Spontaneity Properties Thrombus Aging  +---------+---------------+---------+-----------+----------+--------------+  CFV       Full            Yes       Yes                                    +---------+---------------+---------+-----------+----------+--------------+  SFJ       Full                                                             +---------+---------------+---------+-----------+----------+--------------+  FV Prox   Full                                                             +---------+---------------+---------+-----------+----------+--------------+  FV Mid    Full                                                             +---------+---------------+---------+-----------+----------+--------------+  FV Distal Partial  Chronic         +---------+---------------+---------+-----------+----------+--------------+  PFV       Full                                                              +---------+---------------+---------+-----------+----------+--------------+  POP       Partial         Yes       Yes                    Chronic         +---------+---------------+---------+-----------+----------+--------------+  PTV       Partial                                          Chronic         +---------+---------------+---------+-----------+----------+--------------+  PERO      Partial                                          Chronic         +---------+---------------+---------+-----------+----------+--------------+   +---------+---------------+---------+-----------+----------+--------------+  LEFT      Compressibility Phasicity Spontaneity Properties Thrombus Aging  +---------+---------------+---------+-----------+----------+--------------+  CFV       Full            Yes       Yes                                    +---------+---------------+---------+-----------+----------+--------------+  SFJ       Full                                                             +---------+---------------+---------+-----------+----------+--------------+  FV Prox   Full                                                             +---------+---------------+---------+-----------+----------+--------------+  FV Mid    Full                                                             +---------+---------------+---------+-----------+----------+--------------+  FV Distal Full                                                             +---------+---------------+---------+-----------+----------+--------------+  PFV       Full                                                             +---------+---------------+---------+-----------+----------+--------------+  POP       Full            Yes       Yes                                    +---------+---------------+---------+-----------+----------+--------------+  PTV       Full                                                              +---------+---------------+---------+-----------+----------+--------------+  PERO      Full                                                             +---------+---------------+---------+-----------+----------+--------------+    Summary: Right: Findings consistent with chronic deep vein thrombosis involving the right popliteal vein, right posterior tibial veins, right peroneal veins, and right femoral vein. Left: There is no evidence of deep vein thrombosis in the lower extremity.  *See table(s) above for measurements and observations.    Preliminary         Scheduled Meds:  amLODipine  10 mg Oral Daily   vitamin C  500 mg Oral Daily   aspirin  81 mg Oral Daily   feeding supplement (ENSURE ENLIVE)  237 mL Oral BID BM   folic acid  1 mg Oral Daily   insulin aspart  0-15 Units Subcutaneous TID WC   insulin aspart  0-5 Units Subcutaneous QHS   mouth rinse  15 mL Mouth Rinse BID   methylPREDNISolone (SOLU-MEDROL) injection  0.5 mg/kg Intravenous BID   metoprolol succinate  75 mg Oral Daily   rosuvastatin  10 mg Oral Daily   zinc sulfate  220 mg Oral Daily   Continuous Infusions:  sodium chloride Stopped (12/11/19 1532)   heparin 800 Units/hr (12/11/19 1715)   remdesivir 100 mg in NS 100 mL Stopped (12/11/19 0957)     LOS: 2 days    Time spent: 35 minutes.     Elmarie Shiley, MD [facility]   If 7PM-7AM, please contact night-coverage [email or web address] Password [email or web address] 12/11/2019, 5:21 PM

## 2019-12-11 NOTE — Progress Notes (Addendum)
PROGRESS NOTE    Alfred Foster    Code Status: Full Code  JI:[id number] DOB: Aug 10, 1946 DOA: 12/09/2019  PCP: Binnie Rail, MD    Hospital Summary  Alfred Foster is a 73 y.o. male with medical history significant of hypertension, hyperlipidemia, diabetes mellitus, history of prostate cancer presents to emergency department due to worsening dry cough associated with shortness of breath and sometimes wheezing, low-grade fever, chills, generalized weakness, lethargic, decreased appetite, nausea and loose stools. Positive covid contact, Found to be COVID positive requiring 2L [place] on admission. AKI on admission improved with IV fluids. VQ ordered on 12/29.  A & P   Principal Problem:   Acute hypoxemic respiratory failure due to COVID-19 [facility]) Active Problems:   Diabetes mellitus ([place])   Hyperlipidemia   Essential hypertension   Prostate cancer ([place])   AKI (acute kidney injury) ([place])   1. Acute hypoxic respiratory failure secondary to COVID 19 a. Requiring 2L nasal [place], RA at baseline b. IV solumedrol c. Remdesivir d. Continue Antitussive, Vitamin C, Zinc e. No antibiotics given f. Prone patient g. Concern for PE (D Dimer 6->14 overnight), VQ ordered in light of AKI 2. AKI secondary to poor PO intake a. Improved with IV fluids but not resolved, baseline Cr 1.2-1.4 b. Avoid nephrotoxins c. Hold losartan 3. Hypertension a. Continue amlodipine and metoprolol b. Hold Losartan 4. Hyperlipidemia a. Continue statin 5. Diabetes a. HA1c, 7.1, at/near goal on Januvia at home, on hold b. Sliding scale 6. History of prostate cancer 7. History of DVT RLE in the past a. Completed anticoagulation, no signs of DVT on exam  DVT prophylaxis: Lovenox, SCD Family Communication: Patient's updated at bedside Disposition Plan: Barriers to discharge: clinical improvement  Consultants  none  Procedures  VQ ordered  Antibiotics   Anti-infectives (From admission, onward)   Start      Dose/Rate Route Frequency Ordered Stop   12/10/19 1000  remdesivir 100 mg in sodium chloride 0.9 % 100 mL IVPB     100 mg 200 mL/hr over 30 Minutes Intravenous Daily 12/09/19 1411 12/14/19 0959   12/09/19 1500  remdesivir 200 mg in sodium chloride 0.9% 250 mL IVPB     200 mg 580 mL/hr over 30 Minutes Intravenous Once 12/09/19 1411 12/09/19 1636           Subjective   Patient seen and examined in prone position at bedside in no acute distress and resting comfortably. No acute events overnight. Denies any acute complaints at this time. Ambulating. Tolerating diet well.   Objective   Vitals:   12/10/19 1200 12/10/19 2129 12/11/19 0553 12/11/19 0800  BP: 128/74 (!) 142/93 127/80 (!) 147/92  Pulse: 70 73 70 60  Resp: 14 20 (!) 24 19  Temp:  97.6 F (36.4 C) 97.6 F (36.4 C) (!) 97.2 F (36.2 C)  TempSrc:  Oral Oral Oral  SpO2: 96% 93% 96% 96%  Weight:      Height:        Intake/Output Summary (Last 24 hours) at 12/11/2019 1128 Last data filed at 12/11/2019 0600 Gross per 24 hour  Intake 3424.54 ml  Output 600 ml  Net 2824.54 ml   Filed Weights   12/09/19 1043 12/10/19 0300  Weight: 90.7 kg 92 kg    Examination:  Physical Exam Vitals and nursing note reviewed.  Constitutional:      Appearance: He is well-developed.     Comments: prone  HENT:     Head: Normocephalic.  Cardiovascular:     Pulses: Normal pulses.  Pulmonary:     Effort: Pulmonary effort is normal. No tachypnea.  Musculoskeletal:     Right lower leg: No edema.     Left lower leg: No edema.  Neurological:     General: No focal deficit present.     Mental Status: He is alert and oriented to person, place, and time.  Psychiatric:        Mood and Affect: Mood normal.        Behavior: Behavior normal.     Data Reviewed: I have personally reviewed following labs and imaging studies  CBC: Recent Labs  Lab 12/09/19 1055 12/09/19 1449 12/10/19 0702 12/11/19 0500  WBC 4.5 3.9* 3.6* 6.7    NEUTROABS 2.3  --  2.0 5.1  HGB 15.6 14.4 14.1 13.9  HCT 45.4 41.8 43.0 41.6  MCV 93.2 92.3 95.1 94.8  PLT 177 164 160 [id number]   Basic Metabolic Panel: Recent Labs  Lab 12/09/19 1055 12/09/19 1449 12/10/19 0702 12/11/19 0500  NA 135  --  138 138  K 4.7  --  4.6 4.3  CL 104  --  111 113*  CO2 20*  --  20* 16*  GLUCOSE 130*  --  140* 151*  BUN 29*  --  26* 29*  CREATININE 2.03* 1.76* 1.47* 1.27*  CALCIUM 8.6*  --  8.1* 8.1*  MG  --   --  2.2 2.2  PHOS  --   --  4.2 3.7   GFR: Estimated Creatinine Clearance: 59.4 mL/min (A) (by C-G formula based on SCr of 1.27 mg/dL (H)). Liver Function Tests: Recent Labs  Lab 12/10/19 0702 12/11/19 0500  AST 23 23  ALT 20 22  ALKPHOS 63 60  BILITOT 0.6 0.7  PROT 6.1* 5.9*  ALBUMIN 2.8* 2.8*   No results for input(s): LIPASE, AMYLASE in the last 168 hours. No results for input(s): AMMONIA in the last 168 hours. Coagulation Profile: No results for input(s): INR, PROTIME in the last 168 hours. Cardiac Enzymes: No results for input(s): CKTOTAL, CKMB, CKMBINDEX, TROPONINI in the last 168 hours. BNP (last 3 results) No results for input(s): PROBNP in the last 8760 hours. HbA1C: Recent Labs    12/09/19 1448  HGBA1C 7.1*   CBG: Recent Labs  Lab 12/10/19 0853 12/10/19 1244 12/10/19 1731 12/10/19 2111 12/11/19 0756  GLUCAP 123* 126* 159* 146* 128*   Lipid Profile: Recent Labs    12/09/19 1420  CHOL 113  HDL 19*  LDLCALC 76  TRIG 89  CHOLHDL 5.9   Thyroid Function Tests: No results for input(s): TSH, T4TOTAL, FREET4, T3FREE, THYROIDAB in the last 72 hours. Anemia Panel: Recent Labs    12/10/19 0702 12/11/19 0500  FERRITIN 594* 750*   Sepsis Labs: Recent Labs  Lab 12/09/19 1449  PROCALCITON <0.10    Recent Results (from the past 240 hour(s))  Culture, blood (Routine X 2) w Reflex to ID Panel     Status: None (Preliminary result)   Collection Time: 12/09/19 11:15 AM   Specimen: BLOOD  Result Value Ref Range  Status   Specimen Description BLOOD RIGHT ANTECUBITAL  Final   Special Requests   Final    BOTTLES DRAWN AEROBIC AND ANAEROBIC Blood Culture adequate volume   Culture   Final    NO GROWTH < 24 HOURS Performed at [facility], [place] [address], [place], [place] [address]    Report Status PENDING  Incomplete  Culture, blood (  Routine X 2) w Reflex to ID Panel     Status: None (Preliminary result)   Collection Time: 12/09/19  3:10 PM   Specimen: BLOOD  Result Value Ref Range Status   Specimen Description BLOOD BLOOD RIGHT FOREARM  Final   Special Requests   Final    BOTTLES DRAWN AEROBIC AND ANAEROBIC Blood Culture adequate volume   Culture   Final    NO GROWTH < 24 HOURS Performed at [facility], [place] [address], [place], [place] [address]    Report Status PENDING  Incomplete         Radiology Studies: NM Pulmonary Perfusion  Result Date: 12/10/2019 CLINICAL DATA:  Shortness of breath, COVID-19 positive EXAM: NUCLEAR MEDICINE PERFUSION LUNG SCAN TECHNIQUE: Perfusion images were obtained in multiple projections after intravenous injection of radiopharmaceutical. Ventilation scans intentionally deferred if perfusion scan and chest x-ray adequate for interpretation during COVID 19 epidemic. RADIOPHARMACEUTICALS:  1.6 mCi Tc-15m MAA IV COMPARISON:  Chest x-ray 12/09/2019 FINDINGS: There are multiple large segmental perfusion defects within the bilateral lungs. Although no ventilation scan was performed secondary to COVID-19 pandemic protocols, the chest radiograph dated 12/09/2019 shows no matched airspace opacities. IMPRESSION: Perfusion only study with multiple bilateral segmental perfusion defects without corresponding radiographic abnormality. Findings are high probability for pulmonary embolism. These results will be called to the ordering clinician or representative by the Radiologist Assistant, and communication documented in the PACS or zVision Dashboard. Electronically  Signed   By: Davina Poke D.O.   On: 12/10/2019 16:34        Scheduled Meds:  amLODipine  10 mg Oral Daily   vitamin C  500 mg Oral Daily   aspirin  81 mg Oral Daily   feeding supplement (ENSURE ENLIVE)  237 mL Oral BID BM   folic acid  1 mg Oral Daily   insulin aspart  0-15 Units Subcutaneous TID WC   insulin aspart  0-5 Units Subcutaneous QHS   mouth rinse  15 mL Mouth Rinse BID   methylPREDNISolone (SOLU-MEDROL) injection  0.5 mg/kg Intravenous BID   metoprolol succinate  75 mg Oral Daily   rosuvastatin  10 mg Oral Daily   zinc sulfate  220 mg Oral Daily   Continuous Infusions:  sodium chloride 100 mL/hr at 12/11/19 0223   heparin 800 Units/hr (12/11/19 1041)   remdesivir 100 mg in NS 100 mL 100 mg (12/11/19 0928)     LOS: 2 days    Time spent: 25 minutes with over 50% of the time coordinating the patient's care    Harold Hedge, DO [facility] Pager [phone]  If 7PM-7AM, please contact night-coverage [email or web address] Password [email or web address] 12/11/2019, 11:28 AM

## 2019-12-11 NOTE — Progress Notes (Signed)
[place] for Heparin Indication: D-dimer >14 in COVID+, Concern for PE  Allergies  Allergen Reactions  . Hydrochlorothiazide     SOB, dizzy  . Metformin And Related Other (See Comments)    Make him feel full and uncomfortable    Patient Measurements: Height: 6\' 1"  (185.4 cm) Weight: 202 lb 13.2 oz (92 kg) IBW/kg (Calculated) : 79.9 Heparin Dosing Weight: 92 kg  Vital Signs: Temp: 98.6 F (37 C) (12/30 1449) Temp Source: Oral (12/30 0800) BP: 147/92 (12/30 0800) Pulse Rate: 60 (12/30 0800)  Labs: Recent Labs    12/09/19 1449 12/09/19 1449 12/09/19 1611 12/10/19 0702 12/11/19 0500 12/11/19 0726 12/11/19 1800  HGB 14.4  --   --  14.1 13.9  --   --   HCT 41.8  --   --  43.0 41.6  --   --   PLT 164  --   --  160 196  --   --   HEPARINUNFRC  --    < >  --   --  1.07* 1.03* 0.45  CREATININE 1.76*  --   --  1.47* 1.27*  --   --   TROPONINIHS 9  --  8  --   --   --   --    < > = values in this interval not displayed.    Estimated Creatinine Clearance: 59.4 mL/min (A) (by C-G formula based on SCr of 1.27 mg/dL (H)).   Medical History: Past Medical History:  Diagnosis Date  . Diabetes mellitus   . Hyperlipidemia   . Hypertension   . Prostate cancer [facility]) 2007   Dr Alinda Money   Assessment: 73 yr old male presented with SOB/cough in setting of COVID; D-dimer elevated (14.55>13.94) and also concern for PE. Pt was not on anticoagulation PTA.  12/29: VQ scan: high probability for PE  12/30: LE venous duplex:  Right: findings consistent with chronic DVT involving right popliteal vein, right posterior tibial veins, right peroneal veins, and right femoral vein Left: no evidence of DVT  Heparin level ~7.5 hrs after heparin infusion decreased to 800 units/hr is 0.45 units/ml, which is within the goal range for this pt. CBC WNL. Per RN, no issues with IV or bleeding observed.   Goal of Therapy:  Heparin level 0.3-0.7 units/ml Monitor  platelets by anticoagulation protocol: Yes   Plan:  Continue heparin infusion at 800 units/hr Check confirmatory heparin level in 8 hrs Monitor daily heparin level, CBC Monitor for signs/symptoms of bleeding F/U long-term anticoagulation plans  Gillermina Hu, PharmD, BCPS, [facility] Clinical Pharmacist 12/11/2019 7:35 PM

## 2019-12-12 LAB — COMPREHENSIVE METABOLIC PANEL
ALT: 20 U/L (ref 0–44)
AST: 22 U/L (ref 15–41)
Albumin: 2.8 g/dL — ABNORMAL LOW (ref 3.5–5.0)
Alkaline Phosphatase: 55 U/L (ref 38–126)
Anion gap: 6 (ref 5–15)
BUN: 27 mg/dL — ABNORMAL HIGH (ref 8–23)
CO2: 20 mmol/L — ABNORMAL LOW (ref 22–32)
Calcium: 8.3 mg/dL — ABNORMAL LOW (ref 8.9–10.3)
Chloride: 115 mmol/L — ABNORMAL HIGH (ref 98–111)
Creatinine, Ser: 1.47 mg/dL — ABNORMAL HIGH (ref 0.61–1.24)
GFR calc Af Amer: 54 mL/min — ABNORMAL LOW (ref >60)
GFR calc non Af Amer: 47 mL/min — ABNORMAL LOW (ref >60)
Glucose, Bld: 163 mg/dL — ABNORMAL HIGH (ref 70–99)
Potassium: 4 mmol/L (ref 3.5–5.1)
Sodium: 141 mmol/L (ref 135–145)
Total Bilirubin: 0.7 mg/dL (ref 0.3–1.2)
Total Protein: 5.7 g/dL — ABNORMAL LOW (ref 6.5–8.1)

## 2019-12-12 LAB — CBC WITH DIFFERENTIAL/PLATELET
Abs Immature Granulocytes: 0.1 10*3/uL — ABNORMAL HIGH (ref 0.00–0.07)
Basophils Absolute: 0 10*3/uL (ref 0.0–0.1)
Basophils Relative: 0 %
Eosinophils Absolute: 0 10*3/uL (ref 0.0–0.5)
Eosinophils Relative: 0 %
HCT: 42 % (ref 39.0–52.0)
Hemoglobin: 14 g/dL (ref 13.0–17.0)
Immature Granulocytes: 1 %
Lymphocytes Relative: 10 %
Lymphs Abs: 1 10*3/uL (ref 0.7–4.0)
MCH: 31.4 pg (ref 26.0–34.0)
MCHC: 33.3 g/dL (ref 30.0–36.0)
MCV: 94.2 fL (ref 80.0–100.0)
Monocytes Absolute: 0.7 10*3/uL (ref 0.1–1.0)
Monocytes Relative: 7 %
Neutro Abs: 8.1 10*3/uL — ABNORMAL HIGH (ref 1.7–7.7)
Neutrophils Relative %: 82 %
Platelets: 225 10*3/uL (ref 150–400)
RBC: 4.46 MIL/uL (ref 4.22–5.81)
RDW: 13.2 % (ref 11.5–15.5)
WBC: 9.9 10*3/uL (ref 4.0–10.5)
nRBC: 0 % (ref 0.0–0.2)

## 2019-12-12 LAB — PHOSPHORUS: Phosphorus: 3.1 mg/dL (ref 2.5–4.6)

## 2019-12-12 LAB — GLUCOSE, CAPILLARY
Glucose-Capillary: 140 mg/dL — ABNORMAL HIGH (ref 70–99)
Glucose-Capillary: 176 mg/dL — ABNORMAL HIGH (ref 70–99)
Glucose-Capillary: 204 mg/dL — ABNORMAL HIGH (ref 70–99)
Glucose-Capillary: 138 mg/dL — ABNORMAL HIGH (ref 70–99)

## 2019-12-12 LAB — HEPARIN LEVEL (UNFRACTIONATED): Heparin Unfractionated: 0.51 IU/mL (ref 0.30–0.70)

## 2019-12-12 LAB — FERRITIN: Ferritin: 713 ng/mL — ABNORMAL HIGH (ref 24–336)

## 2019-12-12 LAB — C-REACTIVE PROTEIN: CRP: 0.8 mg/dL (ref <1.0)

## 2019-12-12 LAB — MAGNESIUM: Magnesium: 2.2 mg/dL (ref 1.7–2.4)

## 2019-12-12 LAB — D-DIMER, QUANTITATIVE: D-Dimer, Quant: 3.86 ug/mL-FEU — ABNORMAL HIGH (ref 0.00–0.50)

## 2019-12-12 MED ORDER — LOPERAMIDE HCL 2 MG PO CAPS
2.0000 mg | ORAL_CAPSULE | ORAL | Status: DC | PRN
  Administered 2019-12-12: 2 mg via ORAL
  Filled 2019-12-12: qty 1

## 2019-12-12 NOTE — Progress Notes (Signed)
PROGRESS NOTE    Alfred Foster  [id number] DOB: [birth year] DOA: 12/09/2019 PCP: Binnie Rail, MD   Brief Narrative: 73 year old with past medical history significant for hypertension, hyperlipidemia, diabetes history of prostate cancer presents to the ED complaining of worsening dry cough and shortness of breath low-grade fever, lethargy loose stool.  Positive Covid contact.  Found to be positive for Covid requiring 2 L of oxygen on admission.  AKI improved with IV fluids.  VQ scan ordered on 1229 with high probability for PE started on heparin drip.    Assessment & Plan:   Principal Problem:   Acute hypoxemic respiratory failure due to COVID-19 [facility]) Active Problems:   Diabetes mellitus ([place])   Hyperlipidemia   Essential hypertension   Prostate cancer ([place])   AKI (acute kidney injury) ([place])  1-Acute Hypoxic Respiratory Failure secondary to COVID-19: Requiring 2 L of oxygen Continue with IV Solu-Medrol and remdesivir day 4 Continue with vitamin C and zinc D-dimer more than 14 concern for PE VQ scan high probability for PE Doppler lower extremity: Right lower extremity with chronic deep vein thrombosis involving right popliteal vein right posterior tibial veins and right peroneal veins and right femoral vein.  Plan to continue with heparin for another 24 hours.  COVID-19 Labs  Recent Labs    12/10/19 0702 12/11/19 0500 12/12/19 0653  DDIMER 14.55* 13.94* 3.86*  FERRITIN 594* 750* 713*  CRP 3.2* 1.3* 0.8    No results found for: SARSCOV2NAA 2-Probably PE; and chronic DVT right popliteal vein, right femoral vein: Patient VQ scan with high probability for PE elevated D-dimer We will continue with heparin drip for 48 hours. Patient will require long-term anticoagulation due to chronic DVT.  And prior episode of DVT Plan to transition to eliquis tomorrow.   3-AKI secondary to poor oral intake Baseline creatinine 1.2--1.4 Continue to hold losartan Continue with IV  fluids  Hypertension: Continue with amlodipine and metoprolol  Hyperlipidemia continue with statins  Diabetes continue with sliding scale History of DVT in right lower extremity in the past completed anticoagulation Doppler repeated during this admission.  Metabolic acidosis: Continue with IV fluids. Improved, Diarrhea; start imodium PRN   Nutrition Problem: Increased nutrient needs Etiology: acute illness(COVID-19)    Signs/Symptoms: estimated needs    Interventions: Ensure Enlive (each supplement provides 350kcal and 20 grams of protein), Liberalize Diet  Estimated body mass index is 26.76 kg/m as calculated from the following:   Height as of this encounter: 6\' 1"  (1.854 m).   Weight as of this encounter: 92 kg.   DVT prophylaxis: heparin Gtt Code Status: full ocde Family Communication: care discussed with patient Disposition Plan: remain in the hospital for treatment of presume PE Consultants:   none  Procedures:  VQ scan: Perfusion only study with multiple bilateral segmental perfusion defects without corresponding radiographic abnormality. Findings are high probability for pulmonary embolism.    Antimicrobials:    Subjective: He is feeling better, dyspnea improving.   Objective: Vitals:   12/11/19 2111 12/12/19 0515 12/12/19 0800 12/12/19 1515  BP: (!) 150/82 140/82 (!) 150/87 (!) 171/104  Pulse: 72 60 (!) 59 76  Resp: 19 18 20 17   Temp: 98.7 F (37.1 C) 97.6 F (36.4 C) 98.2 F (36.8 C) 98.1 F (36.7 C)  TempSrc: Oral Oral Oral Oral  SpO2: 97% 98% 99% 98%  Weight:      Height:        Intake/Output Summary (Last 24 hours) at 12/12/2019 1558 Last data  filed at 12/12/2019 1514 Gross per 24 hour  Intake 1836.2 ml  Output 1200 ml  Net 636.2 ml   Filed Weights   12/09/19 1043 12/10/19 0300  Weight: 90.7 kg 92 kg    Examination:  General exam: NAD Respiratory system: Crackles bases Cardiovascular system: S 1, S 2  RRR Gastrointestinal system: BS present, soft, nt Central nervous system: Non focal.  Extremities: Symmetric power.  Skin: no rashes    Data Reviewed: I have personally reviewed following labs and imaging studies  CBC: Recent Labs  Lab 12/09/19 1055 12/09/19 1449 12/10/19 0702 12/11/19 0500 12/12/19 0653  WBC 4.5 3.9* 3.6* 6.7 9.9  NEUTROABS 2.3  --  2.0 5.1 8.1*  HGB 15.6 14.4 14.1 13.9 14.0  HCT 45.4 41.8 43.0 41.6 42.0  MCV 93.2 92.3 95.1 94.8 94.2  PLT 177 164 160 196 [id number]   Basic Metabolic Panel: Recent Labs  Lab 12/09/19 1055 12/09/19 1449 12/10/19 0702 12/11/19 0500 12/12/19 0653  NA 135  --  138 138 141  K 4.7  --  4.6 4.3 4.0  CL 104  --  111 113* 115*  CO2 20*  --  20* 16* 20*  GLUCOSE 130*  --  140* 151* 163*  BUN 29*  --  26* 29* 27*  CREATININE 2.03* 1.76* 1.47* 1.27* 1.47*  CALCIUM 8.6*  --  8.1* 8.1* 8.3*  MG  --   --  2.2 2.2 2.2  PHOS  --   --  4.2 3.7 3.1   GFR: Estimated Creatinine Clearance: 51.3 mL/min (A) (by C-G formula based on SCr of 1.47 mg/dL (H)). Liver Function Tests: Recent Labs  Lab 12/10/19 0702 12/11/19 0500 12/12/19 0653  AST 23 23 22   ALT 20 22 20   ALKPHOS 63 60 55  BILITOT 0.6 0.7 0.7  PROT 6.1* 5.9* 5.7*  ALBUMIN 2.8* 2.8* 2.8*   No results for input(s): LIPASE, AMYLASE in the last 168 hours. No results for input(s): AMMONIA in the last 168 hours. Coagulation Profile: No results for input(s): INR, PROTIME in the last 168 hours. Cardiac Enzymes: No results for input(s): CKTOTAL, CKMB, CKMBINDEX, TROPONINI in the last 168 hours. BNP (last 3 results) No results for input(s): PROBNP in the last 8760 hours. HbA1C: No results for input(s): HGBA1C in the last 72 hours. CBG: Recent Labs  Lab 12/11/19 0756 12/11/19 1148 12/11/19 2109 12/12/19 0807 12/12/19 1152  GLUCAP 128* 104* 163* 140* 176*   Lipid Profile: No results for input(s): CHOL, HDL, LDLCALC, TRIG, CHOLHDL, LDLDIRECT in the last 72 hours. Thyroid  Function Tests: No results for input(s): TSH, T4TOTAL, FREET4, T3FREE, THYROIDAB in the last 72 hours. Anemia Panel: Recent Labs    12/11/19 0500 12/12/19 0653  FERRITIN 750* 713*   Sepsis Labs: Recent Labs  Lab 12/09/19 1449  PROCALCITON <0.10    Recent Results (from the past 240 hour(s))  Culture, blood (Routine X 2) w Reflex to ID Panel     Status: None (Preliminary result)   Collection Time: 12/09/19 11:15 AM   Specimen: BLOOD  Result Value Ref Range Status   Specimen Description BLOOD RIGHT ANTECUBITAL  Final   Special Requests   Final    BOTTLES DRAWN AEROBIC AND ANAEROBIC Blood Culture adequate volume   Culture   Final    NO GROWTH 3 DAYS Performed at [facility], [place] [address] [place],  [address]    Report Status PENDING  Incomplete  Culture, blood (Routine X 2) w  Reflex to ID Panel     Status: None (Preliminary result)   Collection Time: 12/09/19  3:10 PM   Specimen: BLOOD  Result Value Ref Range Status   Specimen Description BLOOD BLOOD RIGHT FOREARM  Final   Special Requests   Final    BOTTLES DRAWN AEROBIC AND ANAEROBIC Blood Culture adequate volume   Culture   Final    NO GROWTH 3 DAYS Performed at [facility], [address] [place], [place] [address]    Report Status PENDING  Incomplete         Radiology Studies: NM Pulmonary Perfusion  Result Date: 12/10/2019 CLINICAL DATA:  Shortness of breath, COVID-19 positive EXAM: NUCLEAR MEDICINE PERFUSION LUNG SCAN TECHNIQUE: Perfusion images were obtained in multiple projections after intravenous injection of radiopharmaceutical. Ventilation scans intentionally deferred if perfusion scan and chest x-ray adequate for interpretation during COVID 19 epidemic. RADIOPHARMACEUTICALS:  1.6 mCi Tc-49m MAA IV COMPARISON:  Chest x-ray 12/09/2019 FINDINGS: There are multiple large segmental perfusion defects within the bilateral lungs. Although no ventilation scan was performed secondary to  COVID-19 pandemic protocols, the chest radiograph dated 12/09/2019 shows no matched airspace opacities. IMPRESSION: Perfusion only study with multiple bilateral segmental perfusion defects without corresponding radiographic abnormality. Findings are high probability for pulmonary embolism. These results will be called to the ordering clinician or representative by the Radiologist Assistant, and communication documented in the PACS or zVision Dashboard. Electronically Signed   By: Davina Poke D.O.   On: 12/10/2019 16:34   VAS [place] LOWER EXTREMITY VENOUS (DVT)  Result Date: 12/11/2019  Lower Venous Study Indications: Positive D dimer with Covid 19. History of right leg DVT on 09/12/18.  Anticoagulation: Lovenox. Performing Technologist: Oda Cogan RDMS, RVT  Examination Guidelines: A complete evaluation includes B-mode imaging, spectral Doppler, color Doppler, and power Doppler as needed of all accessible portions of each vessel. Bilateral testing is considered an integral part of a complete examination. Limited examinations for reoccurring indications may be performed as noted.  +---------+---------------+---------+-----------+----------+--------------+ RIGHT    CompressibilityPhasicitySpontaneityPropertiesThrombus Aging +---------+---------------+---------+-----------+----------+--------------+ CFV      Full           Yes      Yes                                 +---------+---------------+---------+-----------+----------+--------------+ SFJ      Full                                                        +---------+---------------+---------+-----------+----------+--------------+ FV Prox  Full                                                        +---------+---------------+---------+-----------+----------+--------------+ FV Mid   Full                                                        +---------+---------------+---------+-----------+----------+--------------+ FV  DistalPartial  Chronic        +---------+---------------+---------+-----------+----------+--------------+ PFV      Full                                                        +---------+---------------+---------+-----------+----------+--------------+ POP      Partial        Yes      Yes                  Chronic        +---------+---------------+---------+-----------+----------+--------------+ PTV      Partial                                      Chronic        +---------+---------------+---------+-----------+----------+--------------+ PERO     Partial                                      Chronic        +---------+---------------+---------+-----------+----------+--------------+   +---------+---------------+---------+-----------+----------+--------------+ LEFT     CompressibilityPhasicitySpontaneityPropertiesThrombus Aging +---------+---------------+---------+-----------+----------+--------------+ CFV      Full           Yes      Yes                                 +---------+---------------+---------+-----------+----------+--------------+ SFJ      Full                                                        +---------+---------------+---------+-----------+----------+--------------+ FV Prox  Full                                                        +---------+---------------+---------+-----------+----------+--------------+ FV Mid   Full                                                        +---------+---------------+---------+-----------+----------+--------------+ FV DistalFull                                                        +---------+---------------+---------+-----------+----------+--------------+ PFV      Full                                                        +---------+---------------+---------+-----------+----------+--------------+ POP  Full           Yes      Yes                                  +---------+---------------+---------+-----------+----------+--------------+ PTV      Full                                                        +---------+---------------+---------+-----------+----------+--------------+ PERO     Full                                                        +---------+---------------+---------+-----------+----------+--------------+    Summary: Right: Findings consistent with chronic deep vein thrombosis involving the right popliteal vein, right posterior tibial veins, right peroneal veins, and right femoral vein. Left: There is no evidence of deep vein thrombosis in the lower extremity.  *See table(s) above for measurements and observations. Electronically signed by Monica Martinez MD on 12/11/2019 at 5:44:59 PM.    Final         Scheduled Meds: . amLODipine  10 mg Oral Daily  . vitamin C  500 mg Oral Daily  . aspirin  81 mg Oral Daily  . feeding supplement (ENSURE ENLIVE)  237 mL Oral BID BM  . folic acid  1 mg Oral Daily  . insulin aspart  0-15 Units Subcutaneous TID WC  . insulin aspart  0-5 Units Subcutaneous QHS  . mouth rinse  15 mL Mouth Rinse BID  . methylPREDNISolone (SOLU-MEDROL) injection  0.5 mg/kg Intravenous BID  . metoprolol succinate  75 mg Oral Daily  . rosuvastatin  10 mg Oral Daily  . zinc sulfate  220 mg Oral Daily   Continuous Infusions: . sodium chloride 75 mL/hr at 12/11/19 1831  . heparin 800 Units/hr (12/11/19 1715)  . remdesivir 100 mg in NS 100 mL 100 mg (12/12/19 1010)     LOS: 3 days    Time spent: 35 minutes.     Elmarie Shiley, MD [facility]   If 7PM-7AM, please contact night-coverage [email or web address] Password [facility] 12/12/2019, 3:58 PM

## 2019-12-12 NOTE — Progress Notes (Signed)
[place] for Heparin Indication: D-dimer >14 in COVID+, Concern for PE  Allergies  Allergen Reactions  . Hydrochlorothiazide     SOB, dizzy  . Metformin And Related Other (See Comments)    Make him feel full and uncomfortable    Patient Measurements: Height: 6\' 1"  (185.4 cm) Weight: 202 lb 13.2 oz (92 kg) IBW/kg (Calculated) : 79.9 Heparin Dosing Weight: 92 kg  Vital Signs: Temp: 97.6 F (36.4 C) (12/31 0515) Temp Source: Oral (12/31 0515) BP: 140/82 (12/31 0515) Pulse Rate: 60 (12/31 0515)  Labs: Recent Labs    12/09/19 1449 12/09/19 1449 12/09/19 1611 12/10/19 0702 12/11/19 0500 12/11/19 0726 12/11/19 1800 12/12/19 0653  HGB 14.4  --   --  14.1 13.9  --   --   --   HCT 41.8  --   --  43.0 41.6  --   --   --   PLT 164  --   --  160 196  --   --   --   HEPARINUNFRC  --    < >  --   --  1.07* 1.03* 0.45 0.51  CREATININE 1.76*  --   --  1.47* 1.27*  --   --   --   TROPONINIHS 9  --  8  --   --   --   --   --    < > = values in this interval not displayed.    Estimated Creatinine Clearance: 59.4 mL/min (A) (by C-G formula based on SCr of 1.27 mg/dL (H)).   Medical History: Past Medical History:  Diagnosis Date  . Diabetes mellitus   . Hyperlipidemia   . Hypertension   . Prostate cancer [facility] 2007   Dr Alinda Money   Assessment: 73 yr old male presented with SOB/cough in setting of COVID; D-dimer elevated (14.55>13.94) and also concern for PE. Pt was not on anticoagulation PTA.  12/29: VQ scan: high probability for PE  12/30: LE venous duplex:  Right: findings consistent with chronic DVT involving right popliteal vein, right posterior tibial veins, right peroneal veins, and right femoral vein Left: no evidence of DVT  Heparin level remains therapeutic this AM x2, CBC stable and no bleeding reported.  Goal of Therapy:  Heparin level 0.3-0.7 units/ml Monitor platelets by anticoagulation protocol: Yes   Plan:   Continue heparin gtt at 800 units/hr Daily heparin level, CBC, s/s bleeding  Bertis Ruddy, PharmD Clinical Pharmacist Please check AMION for all [place] numbers 12/12/2019 7:46 AM

## 2019-12-13 LAB — COMPREHENSIVE METABOLIC PANEL
ALT: 21 U/L (ref 0–44)
AST: 22 U/L (ref 15–41)
Albumin: 2.7 g/dL — ABNORMAL LOW (ref 3.5–5.0)
Alkaline Phosphatase: 53 U/L (ref 38–126)
Anion gap: 7 (ref 5–15)
BUN: 26 mg/dL — ABNORMAL HIGH (ref 8–23)
CO2: 19 mmol/L — ABNORMAL LOW (ref 22–32)
Calcium: 8.2 mg/dL — ABNORMAL LOW (ref 8.9–10.3)
Chloride: 113 mmol/L — ABNORMAL HIGH (ref 98–111)
Creatinine, Ser: 1.26 mg/dL — ABNORMAL HIGH (ref 0.61–1.24)
GFR calc Af Amer: >60 mL/min (ref >60)
GFR calc non Af Amer: 57 mL/min — ABNORMAL LOW (ref >60)
Glucose, Bld: 172 mg/dL — ABNORMAL HIGH (ref 70–99)
Potassium: 3.9 mmol/L (ref 3.5–5.1)
Sodium: 139 mmol/L (ref 135–145)
Total Bilirubin: 0.5 mg/dL (ref 0.3–1.2)
Total Protein: 5.4 g/dL — ABNORMAL LOW (ref 6.5–8.1)

## 2019-12-13 LAB — CBC WITH DIFFERENTIAL/PLATELET
Abs Immature Granulocytes: 0.11 10*3/uL — ABNORMAL HIGH (ref 0.00–0.07)
Basophils Absolute: 0 10*3/uL (ref 0.0–0.1)
Basophils Relative: 0 %
Eosinophils Absolute: 0 10*3/uL (ref 0.0–0.5)
Eosinophils Relative: 0 %
HCT: 38.4 % — ABNORMAL LOW (ref 39.0–52.0)
Hemoglobin: 13.1 g/dL (ref 13.0–17.0)
Immature Granulocytes: 1 %
Lymphocytes Relative: 8 %
Lymphs Abs: 0.8 10*3/uL (ref 0.7–4.0)
MCH: 31.3 pg (ref 26.0–34.0)
MCHC: 34.1 g/dL (ref 30.0–36.0)
MCV: 91.9 fL (ref 80.0–100.0)
Monocytes Absolute: 0.6 10*3/uL (ref 0.1–1.0)
Monocytes Relative: 6 %
Neutro Abs: 8.6 10*3/uL — ABNORMAL HIGH (ref 1.7–7.7)
Neutrophils Relative %: 85 %
Platelets: 229 10*3/uL (ref 150–400)
RBC: 4.18 MIL/uL — ABNORMAL LOW (ref 4.22–5.81)
RDW: 13.2 % (ref 11.5–15.5)
WBC: 10 10*3/uL (ref 4.0–10.5)
nRBC: 0 % (ref 0.0–0.2)

## 2019-12-13 LAB — GLUCOSE, CAPILLARY
Glucose-Capillary: 152 mg/dL — ABNORMAL HIGH (ref 70–99)
Glucose-Capillary: 187 mg/dL — ABNORMAL HIGH (ref 70–99)
Glucose-Capillary: 242 mg/dL — ABNORMAL HIGH (ref 70–99)

## 2019-12-13 LAB — HEPARIN LEVEL (UNFRACTIONATED): Heparin Unfractionated: 0.42 IU/mL (ref 0.30–0.70)

## 2019-12-13 LAB — PHOSPHORUS: Phosphorus: 3 mg/dL (ref 2.5–4.6)

## 2019-12-13 LAB — C-REACTIVE PROTEIN: CRP: 0.6 mg/dL (ref <1.0)

## 2019-12-13 LAB — FERRITIN: Ferritin: 641 ng/mL — ABNORMAL HIGH (ref 24–336)

## 2019-12-13 LAB — MAGNESIUM: Magnesium: 2.2 mg/dL (ref 1.7–2.4)

## 2019-12-13 LAB — D-DIMER, QUANTITATIVE: D-Dimer, Quant: 3.1 ug/mL-FEU — ABNORMAL HIGH (ref 0.00–0.50)

## 2019-12-13 MED ORDER — METOPROLOL SUCCINATE ER 50 MG PO TB24
50.0000 mg | ORAL_TABLET | Freq: Every day | ORAL | Status: DC
  Administered 2019-12-14: 50 mg via ORAL
  Filled 2019-12-13 (×2): qty 1

## 2019-12-13 MED ORDER — APIXABAN 5 MG PO TABS: 5.0000 mg | ORAL_TABLET | Freq: Two times a day (BID) | ORAL | Status: DC

## 2019-12-13 MED ORDER — APIXABAN 5 MG PO TABS
10.0000 mg | ORAL_TABLET | Freq: Two times a day (BID) | ORAL | Status: DC
  Administered 2019-12-13 – 2019-12-14 (×3): 10 mg via ORAL
  Filled 2019-12-13 (×3): qty 2

## 2019-12-13 NOTE — Evaluation (Signed)
Physical Therapy Evaluation Patient Details Name: Alfred Foster MRN: [id number] DOB: 1946-09-06 Today's Date: 12/13/2019   History of Present Illness  74 y.o. male with medical history significant of hypertension, hyperlipidemia, diabetes mellitus, history of prostate cancer presents to emergency department due to worsening dry cough associated with shortness of breath, fever, diarrhea and nausea. In ED pt tachycardic, tachypneic and requires 2L O2 via [place] to maintain SaO2 >90%O2. Chest x-ray shows bibasilar lung opacities concerning for atelectasis/infection. and found to be COVID+ Admitted 12/09/19  Clinical Impression  Patient evaluated by Physical Therapy with no further acute PT needs identified. All education has been completed and the patient has no further questions. Pt is independent with mobility without AD. Pt does not have any follow-up Physical Therapy or equipment needs. PT is signing off. Thank you for this referral.     Follow Up Recommendations No PT follow up;Supervision - Intermittent    Equipment Recommendations  None recommended by PT       Precautions / Restrictions Precautions Precautions: None Restrictions Weight Bearing Restrictions: No      Mobility  Bed Mobility Overal bed mobility: Modified Independent             General bed mobility comments: HoB elevated  Transfers Overall transfer level: Independent               General transfer comment: good power up and steadying in standing  Ambulation/Gait Ambulation/Gait assistance: Independent Gait Distance (Feet): 400 Feet Assistive device: None Gait Pattern/deviations: WFL(Within Functional Limits);Step-through pattern Gait velocity: WFL Gait velocity interpretation: >4.37 ft/sec, indicative of normal walking speed General Gait Details: WFL, able to perform higher level balance with ambulation with no difficulty        Balance Overall balance assessment: Independent                                            Pertinent Vitals/Pain Pain Assessment: No/denies pain    Home Living Family/patient expects to be discharged to:: Private residence Living Arrangements: Spouse/significant other Available Help at Discharge: Family;Available PRN/intermittently Type of Home: House Home Access: Stairs to enter Entrance Stairs-Rails: Can reach both Entrance Stairs-Number of Steps: 2 Home Layout: Two level;Bed/bath upstairs;1/2 bath on main level Home Equipment: Walker - 2 wheels;Cane - single point;Shower seat      Prior Function Level of Independence: Independent         Comments: helps with disabled wife        Extremity/Trunk Assessment   Upper Extremity Assessment Upper Extremity Assessment: Overall WFL for tasks assessed    Lower Extremity Assessment Lower Extremity Assessment: Overall WFL for tasks assessed       Communication   Communication: No difficulties  Cognition Arousal/Alertness: Awake/alert Behavior During Therapy: WFL for tasks assessed/performed Overall Cognitive Status: Within Functional Limits for tasks assessed                                        General Comments General comments (skin integrity, edema, etc.): Able to maintain SaO2 >92%O2 with ambulation        Assessment/Plan    PT Assessment Patent does not need any further PT services         PT Goals (Current goals can be found in the Care Plan  section)  Acute Rehab PT Goals Patient Stated Goal: go home PT Goal Formulation: With patient     AM-PAC PT "6 Clicks" Mobility  Outcome Measure Help needed turning from your back to your side while in a flat bed without using bedrails?: None Help needed moving from lying on your back to sitting on the side of a flat bed without using bedrails?: None Help needed moving to and from a bed to a chair (including a wheelchair)?: None Help needed standing up from a chair using your arms (e.g.,  wheelchair or bedside chair)?: None Help needed to walk in hospital room?: None Help needed climbing 3-5 steps with a railing? : None 6 Click Score: 24    End of Session   Activity Tolerance: Patient tolerated treatment well Patient left: in bed;with call bell/phone within reach Nurse Communication: Mobility status PT Visit Diagnosis: Muscle weakness (generalized) (M62.81)    Time: [id number] PT Time Calculation (min) (ACUTE ONLY): 23 min   Charges:   PT Evaluation $PT Eval Moderate Complexity: 1 Mod PT Treatments $Gait Training: 8-22 mins        Sumayya Muha B. Migdalia Dk PT, DPT Acute Rehabilitation Services Pager [phone] Office [phone]   [place] 12/13/2019, 1:01 PM

## 2019-12-13 NOTE — Progress Notes (Signed)
PROGRESS NOTE    Alfred Foster  [id number] DOB: 06-29-1946 DOA: 12/09/2019 PCP: Binnie Rail, MD   Brief Narrative: 74 year old with past medical history significant for hypertension, hyperlipidemia, diabetes history of prostate cancer presents to the ED complaining of worsening dry cough and shortness of breath low-grade fever, lethargy loose stool.  Positive Covid contact.  Found to be positive for Covid requiring 2 L of oxygen on admission.  AKI improved with IV fluids.  VQ scan ordered on 1229 with high probability for PE started on heparin drip.    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  Plan to transition to Eliquis today.   COVID-19 Labs  Recent Labs    12/11/19 0500 12/12/19 0653 12/13/19 0311  DDIMER 13.94* 3.86* 3.10*  FERRITIN 750* 713* 641*  CRP 1.3* 0.8 0.6    No results found for: SARSCOV2NAA 2-Probably PE; and chronic DVT right popliteal vein, right femoral vein: Patient VQ scan with high probability for PE elevated D-dimer We will continue with heparin drip for 48 hours. Patient will require long-term anticoagulation due to chronic DVT.  And prior episode of DVT Transition to eliquis today  3-AKI secondary to poor oral intake Baseline creatinine 1.2--1.4 Continue to hold losartan Continue with IV fluids  Hypertension:  Continue with amlodipine and metoprolol  Hyperlipidemia continue with statins  Diabetes continue with sliding scale History of DVT in right lower extremity in the past completed anticoagulation Doppler repeated during this admission.  Metabolic acidosis: Continue with IV fluids. Improved, Diarrhea; start imodium PRN. Improved.    Nutrition Problem: Increased nutrient needs Etiology: acute illness(COVID-19)    Signs/Symptoms: estimated needs    Interventions: Ensure Enlive (each supplement provides 350kcal and 20 grams of protein), Liberalize Diet  Estimated body mass index is 26.76 kg/m as calculated from the following:   Height as of this encounter: 6\' 1"  (1.854 m).   Weight as of this encounter: 92 kg.   DVT prophylaxis:Eliquis Code Status: full ocde Family Communication: care discussed with patient Disposition Plan: monitor on eliquis home tomorrow Consultants:   none  Procedures:  VQ scan: Perfusion only study with multiple bilateral segmental perfusion defects without corresponding radiographic abnormality. Findings are high probability for pulmonary embolism.    Antimicrobials:    Subjective: Dyspnea improved, denies chest pain   Objective: Vitals:   12/13/19 1200 12/13/19 1300 12/13/19 1356 12/13/19 1400  BP: (!) 155/83 (!) 147/122 (!) 149/78   Pulse: 63 78 70 72  Resp: (!) 23 16 (!) 21 20  Temp:   98.6 F (37 C)   TempSrc:   Oral   SpO2: 99% 94% 100% 100%  Weight:      Height:        Intake/Output Summary (Last 24 hours) at 12/13/2019 1504 Last data filed at 12/13/2019 1113 Gross per 24 hour  Intake 1686.31 ml  Output 1200 ml  Net  486.31 ml   Filed Weights   12/09/19 1043 12/10/19 0300  Weight: 90.7 kg 92 kg    Examination:  General exam: NAD Respiratory system: CTA Cardiovascular system: S 1, S 2 RRR Gastrointestinal system: BS present, soft, nt Central nervous system: Non focal.  Extremities: Symmetric power Skin: No  rashes    Data Reviewed: I have personally reviewed following labs and imaging studies  CBC: Recent Labs  Lab 12/09/19 1055 12/09/19 1449 12/10/19 0702 12/11/19 0500 12/12/19 0653 12/13/19 0311  WBC 4.5 3.9* 3.6* 6.7 9.9 10.0  NEUTROABS 2.3  --  2.0 5.1 8.1* 8.6*  HGB 15.6 14.4 14.1 13.9 14.0 13.1  HCT 45.4 41.8 43.0 41.6 42.0 38.4*  MCV 93.2 92.3 95.1 94.8 94.2 91.9  PLT 177 164 160 196 225 [id number]   Basic Metabolic Panel: Recent Labs  Lab 12/09/19 1055 12/09/19 1449 12/10/19 0702 12/11/19 0500 12/12/19 0653 12/13/19 0311  NA 135  --  138 138 141 139  K 4.7  --  4.6 4.3 4.0 3.9  CL 104  --  111 113* 115* 113*  CO2 20*  --  20* 16* 20* 19*  GLUCOSE 130*  --  140* 151* 163* 172*  BUN 29*  --  26* 29* 27* 26*  CREATININE 2.03* 1.76* 1.47* 1.27* 1.47* 1.26*  CALCIUM 8.6*  --  8.1* 8.1* 8.3* 8.2*  MG  --   --  2.2 2.2 2.2 2.2  PHOS  --   --  4.2 3.7 3.1 3.0   GFR: Estimated Creatinine Clearance: 59.9 mL/min (A) (by C-G formula based on SCr of 1.26 mg/dL (H)). Liver Function Tests: Recent Labs  Lab 12/10/19 0702 12/11/19 0500 12/12/19 0653 12/13/19 0311  AST 23 23 22 22   ALT 20 22 20 21   ALKPHOS 63 60 55 53  BILITOT 0.6 0.7 0.7 0.5  PROT 6.1* 5.9* 5.7* 5.4*  ALBUMIN 2.8* 2.8* 2.8* 2.7*   No results for input(s): LIPASE, AMYLASE in the last 168 hours. No results for input(s): AMMONIA in the last 168 hours. Coagulation Profile: No results for input(s): INR, PROTIME in the last 168 hours. Cardiac Enzymes: No results for input(s): CKTOTAL, CKMB, CKMBINDEX, TROPONINI in the last 168 hours. BNP (last 3 results) No results for input(s): PROBNP in the last 8760 hours. HbA1C: No results for input(s): HGBA1C in the last 72 hours. CBG: Recent Labs  Lab 12/12/19 0807 12/12/19 1152 12/12/19 1643 12/12/19 2021 12/13/19 1232  GLUCAP 140* 176* 204* 138* 152*   Lipid Profile: No results for input(s): CHOL, HDL, LDLCALC, TRIG, CHOLHDL, LDLDIRECT in the last 72  hours. Thyroid Function Tests: No results for input(s): TSH, T4TOTAL, FREET4, T3FREE, THYROIDAB in the last 72 hours. Anemia Panel: Recent Labs    12/12/19 0653 12/13/19 0311  FERRITIN 713* 641*   Sepsis Labs: Recent Labs  Lab 12/09/19 1449  PROCALCITON <0.10    Recent Results (from the past 240 hour(s))  Culture, blood (Routine X 2) w Reflex to ID Panel     Status: None (Preliminary result)   Collection Time: 12/09/19 11:15 AM   Specimen: BLOOD  Result Value Ref Range Status   Specimen Description BLOOD RIGHT ANTECUBITAL  Final   Special Requests   Final    BOTTLES DRAWN AEROBIC AND ANAEROBIC Blood Culture adequate volume   Culture   Final    NO GROWTH 4 DAYS Performed at [facility], [place] [address] [place], [place] [address]    Report Status PENDING  Incomplete  Culture, blood (Routine X 2) w Reflex to ID Panel     Status: None (Preliminary result)   Collection Time: 12/09/19  3:10 PM   Specimen: BLOOD  Result Value Ref Range Status   Specimen Description BLOOD BLOOD RIGHT FOREARM  Final   Special Requests   Final    BOTTLES DRAWN AEROBIC AND ANAEROBIC Blood Culture adequate volume   Culture   Final    NO GROWTH 4 DAYS Performed at [facility], [place] [address] [place], [place] [address]    Report Status PENDING  Incomplete         Radiology Studies: No results found.      Scheduled Meds: . amLODipine  10 mg Oral Daily  . apixaban  10 mg Oral BID   Followed by  . [START ON 12/20/2019] apixaban  5 mg Oral BID  . vitamin C  500 mg Oral Daily  . aspirin  81 mg Oral Daily  . feeding supplement (ENSURE ENLIVE)  237 mL Oral BID BM  . folic acid  1 mg Oral Daily  . insulin aspart  0-15 Units Subcutaneous TID WC  . insulin aspart  0-5 Units Subcutaneous QHS  . mouth rinse  15 mL Mouth Rinse BID  . methylPREDNISolone (SOLU-MEDROL) injection  0.5 mg/kg Intravenous BID  . [START ON 12/14/2019] metoprolol succinate  50 mg Oral Daily  . rosuvastatin   10 mg Oral Daily  . zinc sulfate  220 mg Oral Daily   Continuous Infusions: . sodium chloride 1,000 mL (12/13/19 0151)     LOS: 4 days    Time spent: 35 minutes.     Elmarie Shiley, MD [facility]   If 7PM-7AM, please contact night-coverage [email or web address] Password [facility] 12/13/2019, 3:04 PM

## 2019-12-13 NOTE — Progress Notes (Addendum)
[place] for Heparin Indication: D-dimer >14 in COVID+, Concern for PE  Allergies  Allergen Reactions  . Hydrochlorothiazide     SOB, dizzy  . Metformin And Related Other (See Comments)    Make him feel full and uncomfortable    Patient Measurements: Height: 6\' 1"  (185.4 cm) Weight: 202 lb 13.2 oz (92 kg) IBW/kg (Calculated) : 79.9 Heparin Dosing Weight: 92 kg  Vital Signs: Temp: 97.6 F (36.4 C) (01/01 0700) Temp Source: Oral (01/01 0700) BP: 145/105 (01/01 0700) Pulse Rate: 50 (01/01 0700)  Labs: Recent Labs    12/11/19 0500 12/11/19 1800 12/12/19 0653 12/13/19 0311  HGB 13.9  --  14.0 13.1  HCT 41.6  --  42.0 38.4*  PLT 196  --  225 229  HEPARINUNFRC 1.07* 0.45 0.51 0.42  CREATININE 1.27*  --  1.47* 1.26*    Estimated Creatinine Clearance: 59.9 mL/min (A) (by C-G formula based on SCr of 1.26 mg/dL (H)).   Medical History: Past Medical History:  Diagnosis Date  . Diabetes mellitus   . Hyperlipidemia   . Hypertension   . Prostate cancer [facility] 2007   Dr Alinda Money   Assessment: 74 yr old male presented with SOB/cough in setting of COVID; D-dimer elevated (14.55>13.94) and also concern for PE. Pt was not on anticoagulation PTA. V/Q scan on 12/19 found high probability for PE. On 12/30 right lower extremity duplex found findings consistent with chronic DVT involving right popliteal vein, right posterior tibial veins, right peroneal veins, and right femoral vein and no evidence of DVT in left lower extremity. D-dimer 3.10.   Heparin level of 0.42 remains therapeutic on heparin 800 units/hr. CBC stable. No reported bleeding.   Goal of Therapy:  Heparin level 0.3-0.7 units/ml Monitor platelets by anticoagulation protocol: Yes   Plan:  Continue heparin gtt at 800 units/hr Monitor heparin level, CBC, and s/s bleeding daily Follow up transition to oral anticoagulant and assess cost via case management consult   Addendum:   Pharmacy consulted to transition from heparin to apixaban for PE/DVT treatment.  Plan:  Start apixaban 10mg  BID x 7 days followed by apixaban 5mg  BID  Discontinue heparin and administer first dose of apixaban 10mg  at same time (RN aware) Placed consult to case management to evaluate cost of apixaban for patient  Pharmacy to provide patient education   Cristela Felt, PharmD PGY1 Pharmacy Resident [employer]: [phone]  12/13/2019 10:14 AM

## 2019-12-14 LAB — CULTURE, BLOOD (ROUTINE X 2)
Culture: NO GROWTH
Culture: NO GROWTH
Special Requests: ADEQUATE
Special Requests: ADEQUATE

## 2019-12-14 LAB — FERRITIN: Ferritin: 660 ng/mL — ABNORMAL HIGH (ref 24–336)

## 2019-12-14 LAB — GLUCOSE, CAPILLARY
Glucose-Capillary: 136 mg/dL — ABNORMAL HIGH (ref 70–99)
Glucose-Capillary: 170 mg/dL — ABNORMAL HIGH (ref 70–99)

## 2019-12-14 LAB — CBC WITH DIFFERENTIAL/PLATELET
Abs Immature Granulocytes: 0.19 10*3/uL — ABNORMAL HIGH (ref 0.00–0.07)
Basophils Absolute: 0 10*3/uL (ref 0.0–0.1)
Basophils Relative: 0 %
Eosinophils Absolute: 0 10*3/uL (ref 0.0–0.5)
Eosinophils Relative: 0 %
HCT: 38.4 % — ABNORMAL LOW (ref 39.0–52.0)
Hemoglobin: 13.3 g/dL (ref 13.0–17.0)
Immature Granulocytes: 2 %
Lymphocytes Relative: 7 %
Lymphs Abs: 0.8 10*3/uL (ref 0.7–4.0)
MCH: 31.7 pg (ref 26.0–34.0)
MCHC: 34.6 g/dL (ref 30.0–36.0)
MCV: 91.6 fL (ref 80.0–100.0)
Monocytes Absolute: 0.7 10*3/uL (ref 0.1–1.0)
Monocytes Relative: 6 %
Neutro Abs: 10 10*3/uL — ABNORMAL HIGH (ref 1.7–7.7)
Neutrophils Relative %: 85 %
Platelets: 256 10*3/uL (ref 150–400)
RBC: 4.19 MIL/uL — ABNORMAL LOW (ref 4.22–5.81)
RDW: 13.2 % (ref 11.5–15.5)
WBC: 11.7 10*3/uL — ABNORMAL HIGH (ref 4.0–10.5)
nRBC: 0 % (ref 0.0–0.2)

## 2019-12-14 LAB — PHOSPHORUS: Phosphorus: 3.4 mg/dL (ref 2.5–4.6)

## 2019-12-14 LAB — C-REACTIVE PROTEIN: CRP: 0.5 mg/dL (ref <1.0)

## 2019-12-14 LAB — COMPREHENSIVE METABOLIC PANEL
ALT: 23 U/L (ref 0–44)
AST: 18 U/L (ref 15–41)
Albumin: 2.7 g/dL — ABNORMAL LOW (ref 3.5–5.0)
Alkaline Phosphatase: 54 U/L (ref 38–126)
Anion gap: 6 (ref 5–15)
BUN: 25 mg/dL — ABNORMAL HIGH (ref 8–23)
CO2: 20 mmol/L — ABNORMAL LOW (ref 22–32)
Calcium: 8 mg/dL — ABNORMAL LOW (ref 8.9–10.3)
Chloride: 114 mmol/L — ABNORMAL HIGH (ref 98–111)
Creatinine, Ser: 1.16 mg/dL (ref 0.61–1.24)
GFR calc Af Amer: >60 mL/min (ref >60)
GFR calc non Af Amer: >60 mL/min (ref >60)
Glucose, Bld: 165 mg/dL — ABNORMAL HIGH (ref 70–99)
Potassium: 3.8 mmol/L (ref 3.5–5.1)
Sodium: 140 mmol/L (ref 135–145)
Total Bilirubin: 0.6 mg/dL (ref 0.3–1.2)
Total Protein: 5.6 g/dL — ABNORMAL LOW (ref 6.5–8.1)

## 2019-12-14 LAB — MAGNESIUM: Magnesium: 2.1 mg/dL (ref 1.7–2.4)

## 2019-12-14 LAB — D-DIMER, QUANTITATIVE: D-Dimer, Quant: 2.41 ug/mL-FEU — ABNORMAL HIGH (ref 0.00–0.50)

## 2019-12-14 MED ORDER — APIXABAN 5 MG PO TABS: 5.0000 mg | ORAL_TABLET | Freq: Two times a day (BID) | ORAL | 3 refills | Status: DC

## 2019-12-14 MED ORDER — METOPROLOL SUCCINATE ER 50 MG PO TB24: 50.0000 mg | ORAL_TABLET | Freq: Every day | ORAL | 0 refills | Status: DC

## 2019-12-14 MED ORDER — ENSURE ENLIVE PO LIQD: 237.0000 mL | Freq: Two times a day (BID) | ORAL | 12 refills | Status: DC

## 2019-12-14 MED ORDER — APIXABAN 5 MG PO TABS: 10.0000 mg | ORAL_TABLET | Freq: Two times a day (BID) | ORAL | 0 refills | Status: DC

## 2019-12-14 MED ORDER — GUAIFENESIN-DM 100-10 MG/5ML PO SYRP: 10.0000 mL | ORAL_SOLUTION | ORAL | 0 refills | Status: DC | PRN

## 2019-12-14 MED ORDER — DEXAMETHASONE 6 MG PO TABS: 6.0000 mg | ORAL_TABLET | Freq: Every day | ORAL | 0 refills | Status: DC

## 2019-12-14 MED ORDER — IPRATROPIUM-ALBUTEROL 20-100 MCG/ACT IN AERS: 1.0000 | INHALATION_SPRAY | Freq: Four times a day (QID) | RESPIRATORY_TRACT | 0 refills | Status: DC | PRN

## 2019-12-14 MED ORDER — ZINC SULFATE 220 (50 ZN) MG PO CAPS: 220.0000 mg | ORAL_CAPSULE | Freq: Every day | ORAL | 0 refills | Status: DC

## 2019-12-14 NOTE — Care Management (Signed)
Spoke w patient's pharmacy, they state that he has used the ELiquis already in his lifetime, cost will be $47/ month. No other CM needs identified.

## 2019-12-14 NOTE — Discharge Summary (Signed)
Physician Discharge Summary  Alfred Foster [id number] DOB: 04/16/1946 DOA: 12/09/2019  PCP: Binnie Rail, MD  Admit date: 12/09/2019 Discharge date: 12/14/2019  Admitted From: Home  Disposition:  Home   Recommendations for Outpatient Follow-up:  1. Follow up with PCP in 1-2 weeks 2. Please obtain BMP/CBC in one week 3. He will need life long anticoagulation likely for presume PE and Chronic DVT LE 4. Follow renal function and consider resuming cozaar and or spironolactone.   Home Health: none Equipment/Devices: none  Discharge Condition: Stable.  CODE STATUS; full code Diet recommendation: Heart Healthy   Brief/Interim Summary: 74 year old with past medical history significant for hypertension, hyperlipidemia, diabetes history of prostate cancer presents to the ED complaining of worsening dry cough and shortness of breath low-grade fever, lethargy loose stool.  Positive Covid contact.  Found to be positive for Covid requiring 2 L of oxygen on admission.  AKI improved with IV fluids.  VQ scan ordered on 1229 with high probability for PE started on heparin drip. Subsequently transition to eliquis.   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  Tolerated eliquis. Stable for discharge.   2-Probably PE; and chronic DVT right popliteal vein, right femoral vein: Patient VQ scan with high probability for PE elevated D-dimer Treated with heparin drip for 48 hours.  Patient will require long-term anticoagulation due to chronic DVT.  And prior episode of DVT Transition to eliquis, tolerated medication.   3-AKI  secondary to poor oral intake Baseline creatinine 1.2--1.4 Continue to hold losartan Treated with IV fluids Holding cozaar and spironolactone at discharge.   Hypertension: Continue with amlodipine and metoprolol Lower dose metoprolol due to bradycardia.   Hyperlipidemia continue with statins  Diabetes continue with sliding scale History of DVT in right lower extremity in the past completed anticoagulation Doppler repeated during this admission. Showed chronic DVT on right.   Metabolic acidosis: Continue with IV fluids. Improved, Diarrhea; received  imodium PRN.  Improved.    Discharge Diagnoses:  Principal Problem:   Acute hypoxemic respiratory failure due to COVID-19 [facility]) Active Problems:   Diabetes mellitus ([place])   Hyperlipidemia   Essential hypertension   Prostate cancer ([place])   AKI (acute kidney injury) [facility])    Discharge Instructions  Discharge Instructions    Diet - low sodium heart healthy   Complete by: As directed    Increase activity slowly   Complete by: As directed      Allergies as of 12/14/2019      Reactions   Hydrochlorothiazide    SOB, dizzy   Metformin And Related Other (See Comments)   Make him feel full and uncomfortable      Medication List    STOP taking these medications   COD LIVER OIL PO   losartan 100 MG tablet Commonly known as: COZAAR   spironolactone 25 MG tablet Commonly known as: ALDACTONE     TAKE these medications   amLODipine 10 MG tablet Commonly known as: NORVASC Take 1 tablet (10 mg total) by mouth daily.   apixaban 5 MG Tabs tablet Commonly known as: ELIQUIS Take 2 tablets (10 mg total) by mouth 2 (two)  times daily for 6 days.   apixaban 5 MG Tabs tablet Commonly known as: ELIQUIS Take 1 tablet (5 mg total) by mouth 2 (two) times daily. Start taking on: December 20, 2019   aspirin 81 MG tablet Take 81 mg by mouth daily.   dexamethasone 6 MG tablet Commonly known as: DECADRON Take 1 tablet (6 mg  total) by mouth daily.   feeding supplement (ENSURE ENLIVE) Liqd Take 237 mLs by mouth 2 (two) times daily between meals.   FISH OIL PO Take 1,000 mg by mouth daily.   folic acid Q000111Q MCG tablet Commonly known as: FOLVITE Take 800 mcg by mouth daily.   guaiFENesin-dextromethorphan 100-10 MG/5ML syrup Commonly known as: ROBITUSSIN DM Take 10 mLs by mouth every 4 (four) hours as needed for cough.   Ipratropium-Albuterol 20-100 MCG/ACT Aers respimat Commonly known as: COMBIVENT Inhale 1 puff into the lungs every 6 (six) hours as needed for wheezing or shortness of breath.   Medical Compression Socks Misc Use daily for leg swelling from DVT.  Dx: I82.461   metoprolol succinate 50 MG 24 hr tablet Commonly known as: TOPROL-XL Take 1 tablet (50 mg total) by mouth daily. Take with or immediately following a meal. Start taking on: December 15, 2019 What changed:   medication strength  See the new instructions.   niacin 500 MG tablet Take 500 mg by mouth daily.   ONE TOUCH ULTRA TEST test strip Generic drug: glucose blood use as directed   onetouch ultrasoft lancets use as directed   rosuvastatin 10 MG tablet Commonly known as: CRESTOR TAKE 1 TABLET(10 MG) BY MOUTH DAILY What changed: See the new instructions.   sitaGLIPtin 100 MG tablet Commonly known as: JANUVIA Take 1 tablet (100 mg total) by mouth daily.   tiZANidine 2 MG tablet Commonly known as: ZANAFLEX Take 1 tablet (2 mg total) by mouth every 8 (eight) hours as needed for muscle spasms.   VITAMIN C PO Take 1 tablet by mouth daily.   Vitamin D3 25 MCG (1000 UT) Caps Take 1,000 Units by mouth daily.   zinc sulfate 220 (50 Zn) MG capsule Take 1 capsule (220 mg total) by mouth daily. Start taking on: December 15, 2019      Follow-up Information    Burns, Claudina Lick, MD Follow up in 2 week(s).   Specialty: Internal Medicine Contact information: [place] [address] [phone]           Allergies  Allergen Reactions  . Hydrochlorothiazide     SOB, dizzy  . Metformin And Related Other (See Comments)    Make him feel full and uncomfortable    Consultations:  none   Procedures/Studies: NM Pulmonary Perfusion  Result Date: 12/10/2019 CLINICAL DATA:  Shortness of breath, COVID-19 positive EXAM: NUCLEAR MEDICINE PERFUSION LUNG SCAN TECHNIQUE: Perfusion images were obtained in multiple projections after intravenous injection of radiopharmaceutical. Ventilation scans intentionally deferred if perfusion scan and chest x-ray adequate for interpretation during COVID 19 epidemic. RADIOPHARMACEUTICALS:  1.6 mCi Tc-97m MAA IV COMPARISON:  Chest x-ray 12/09/2019 FINDINGS: There are multiple large segmental perfusion defects within the bilateral lungs. Although no ventilation scan was performed secondary to COVID-19 pandemic protocols, the chest radiograph dated 12/09/2019 shows no matched airspace opacities. IMPRESSION: Perfusion only study with multiple bilateral segmental perfusion defects without corresponding radiographic abnormality. Findings are high probability for pulmonary embolism. These results will be called to the ordering clinician or representative by the Radiologist Assistant, and communication documented in  the PACS or zVision Dashboard. Electronically Signed   By: Davina Poke D.O.   On: 12/10/2019 16:34   DG Chest Port 1 View  Result Date: 12/09/2019 CLINICAL DATA:  Shortness of breath, chest pain EXAM: PORTABLE CHEST 1 VIEW COMPARISON:  01/29/2018 FINDINGS: The heart size and mediastinal contours are within normal limits. Subtle patchy bibasilar airspace opacities. No pleural effusion. No pneumothorax. Degenerative changes of the shoulders, left worse than right. IMPRESSION: Subtle patchy bibasilar airspace opacities could reflect atelectasis or developing infection. Electronically Signed   By: Davina Poke D.O.   On: 12/09/2019 11:24   VAS [place] LOWER  EXTREMITY VENOUS (DVT)  Result Date: 12/11/2019  Lower Venous Study Indications: Positive D dimer with Covid 19. History of right leg DVT on 09/12/18.  Anticoagulation: Lovenox. Performing Technologist: Oda Cogan RDMS, RVT  Examination Guidelines: A complete evaluation includes B-mode imaging, spectral Doppler, color Doppler, and power Doppler as needed of all accessible portions of each vessel. Bilateral testing is considered an integral part of a complete examination. Limited examinations for reoccurring indications may be performed as noted.  +---------+---------------+---------+-----------+----------+--------------+ RIGHT    CompressibilityPhasicitySpontaneityPropertiesThrombus Aging +---------+---------------+---------+-----------+----------+--------------+ CFV      Full           Yes      Yes                                 +---------+---------------+---------+-----------+----------+--------------+ SFJ      Full                                                        +---------+---------------+---------+-----------+----------+--------------+ FV Prox  Full                                                        +---------+---------------+---------+-----------+----------+--------------+ FV Mid   Full                                                        +---------+---------------+---------+-----------+----------+--------------+ FV DistalPartial                                      Chronic        +---------+---------------+---------+-----------+----------+--------------+ PFV      Full                                                        +---------+---------------+---------+-----------+----------+--------------+ POP      Partial        Yes      Yes                  Chronic        +---------+---------------+---------+-----------+----------+--------------+ PTV  Partial                                      Chronic         +---------+---------------+---------+-----------+----------+--------------+ PERO     Partial                                      Chronic        +---------+---------------+---------+-----------+----------+--------------+   +---------+---------------+---------+-----------+----------+--------------+ LEFT     CompressibilityPhasicitySpontaneityPropertiesThrombus Aging +---------+---------------+---------+-----------+----------+--------------+ CFV      Full           Yes      Yes                                 +---------+---------------+---------+-----------+----------+--------------+ SFJ      Full                                                        +---------+---------------+---------+-----------+----------+--------------+ FV Prox  Full                                                        +---------+---------------+---------+-----------+----------+--------------+ FV Mid   Full                                                        +---------+---------------+---------+-----------+----------+--------------+ FV DistalFull                                                        +---------+---------------+---------+-----------+----------+--------------+ PFV      Full                                                        +---------+---------------+---------+-----------+----------+--------------+ POP      Full           Yes      Yes                                 +---------+---------------+---------+-----------+----------+--------------+ PTV      Full                                                        +---------+---------------+---------+-----------+----------+--------------+ PERO     Full                                                        +---------+---------------+---------+-----------+----------+--------------+  Summary: Right: Findings consistent with chronic deep vein thrombosis involving the right popliteal vein, right  posterior tibial veins, right peroneal veins, and right femoral vein. Left: There is no evidence of deep vein thrombosis in the lower extremity.  *See table(s) above for measurements and observations. Electronically signed by Monica Martinez MD on 12/11/2019 at 5:44:59 PM.    Final       Subjective: Feeling well, denies chest pain  Discharge Exam: Vitals:   12/14/19 0500 12/14/19 0800  BP:  (!) 144/88  Pulse:  61  Resp:  15  Temp: 97.8 F (36.6 C) 97.9 F (36.6 C)  SpO2:  100%     General: Pt is alert, awake, not in acute distress Cardiovascular: RRR, S1/S2 +, no rubs, no gallops Respiratory: CTA bilaterally, no wheezing, no rhonchi Abdominal: Soft, NT, ND, bowel sounds + Extremities: no edema, no cyanosis    The results of significant diagnostics from this hospitalization (including imaging, microbiology, ancillary and laboratory) are listed below for reference.     Microbiology: Recent Results (from the past 240 hour(s))  Culture, blood (Routine X 2) w Reflex to ID Panel     Status: None   Collection Time: 12/09/19 11:15 AM   Specimen: BLOOD  Result Value Ref Range Status   Specimen Description BLOOD RIGHT ANTECUBITAL  Final   Special Requests   Final    BOTTLES DRAWN AEROBIC AND ANAEROBIC Blood Culture adequate volume   Culture   Final    NO GROWTH 5 DAYS Performed at [facility], [address], [place], [place] [address]    Report Status 12/14/2019 FINAL  Final  Culture, blood (Routine X 2) w Reflex to ID Panel     Status: None   Collection Time: 12/09/19  3:10 PM   Specimen: BLOOD  Result Value Ref Range Status   Specimen Description BLOOD BLOOD RIGHT FOREARM  Final   Special Requests   Final    BOTTLES DRAWN AEROBIC AND ANAEROBIC Blood Culture adequate volume   Culture   Final    NO GROWTH 5 DAYS Performed at [facility], [place] [address], [place],  [address]    Report Status 12/14/2019 FINAL  Final     Labs: BNP (last 3  results) Recent Labs    12/09/19 1111  BNP [id number]   Basic Metabolic Panel: Recent Labs  Lab 12/10/19 0702 12/11/19 0500 12/12/19 0653 12/13/19 0311 12/14/19 0500  NA 138 138 141 139 140  K 4.6 4.3 4.0 3.9 3.8  CL 111 113* 115* 113* 114*  CO2 20* 16* 20* 19* 20*  GLUCOSE 140* 151* 163* 172* 165*  BUN 26* 29* 27* 26* 25*  CREATININE 1.47* 1.27* 1.47* 1.26* 1.16  CALCIUM 8.1* 8.1* 8.3* 8.2* 8.0*  MG 2.2 2.2 2.2 2.2 2.1  PHOS 4.2 3.7 3.1 3.0 3.4   Liver Function Tests: Recent Labs  Lab 12/10/19 0702 12/11/19 0500 12/12/19 0653 12/13/19 0311 12/14/19 0500  AST 23 23 22 22 18   ALT 20 22 20 21 23   ALKPHOS 63 60 55 53 54  BILITOT 0.6 0.7 0.7 0.5 0.6  PROT 6.1* 5.9* 5.7* 5.4* 5.6*  ALBUMIN 2.8* 2.8* 2.8* 2.7* 2.7*   No results for input(s): LIPASE, AMYLASE in the last 168 hours. No results for input(s): AMMONIA in the last 168 hours. CBC: Recent Labs  Lab 12/10/19 0702 12/11/19 0500 12/12/19 0653 12/13/19 0311 12/14/19 0500  WBC 3.6* 6.7 9.9 10.0 11.7*  NEUTROABS 2.0 5.1  8.1* 8.6* 10.0*  HGB 14.1 13.9 14.0 13.1 13.3  HCT 43.0 41.6 42.0 38.4* 38.4*  MCV 95.1 94.8 94.2 91.9 91.6  PLT 160 196 225 229 256   Cardiac Enzymes: No results for input(s): CKTOTAL, CKMB, CKMBINDEX, TROPONINI in the last 168 hours. BNP: Invalid input(s): POCBNP CBG: Recent Labs  Lab 12/12/19 2021 12/13/19 1232 12/13/19 1606 12/13/19 2027 12/14/19 0759  GLUCAP 138* 152* 187* 242* 136*   D-Dimer Recent Labs    12/13/19 0311 12/14/19 0500  DDIMER 3.10* 2.41*   Hgb A1c No results for input(s): HGBA1C in the last 72 hours. Lipid Profile No results for input(s): CHOL, HDL, LDLCALC, TRIG, CHOLHDL, LDLDIRECT in the last 72 hours. Thyroid function studies No results for input(s): TSH, T4TOTAL, T3FREE, THYROIDAB in the last 72 hours.  Invalid input(s): FREET3 Anemia work up [employer]    12/13/19 0311 12/14/19 0500  FERRITIN 641* 660*   Urinalysis No results found for:  COLORURINE, APPEARANCEUR, [place], [place], GLUCOSEU, [place], BILIRUBINUR, [place], PROTEINUR, UROBILINOGEN, NITRITE, LEUKOCYTESUR Sepsis Labs Invalid input(s): PROCALCITONIN,  WBC,  LACTICIDVEN Microbiology Recent Results (from the past 240 hour(s))  Culture, blood (Routine X 2) w Reflex to ID Panel     Status: None   Collection Time: 12/09/19 11:15 AM   Specimen: BLOOD  Result Value Ref Range Status   Specimen Description BLOOD RIGHT ANTECUBITAL  Final   Special Requests   Final    BOTTLES DRAWN AEROBIC AND ANAEROBIC Blood Culture adequate volume   Culture   Final    NO GROWTH 5 DAYS Performed at [facility], [place] [address], [place], [place] [address]    Report Status 12/14/2019 FINAL  Final  Culture, blood (Routine X 2) w Reflex to ID Panel     Status: None   Collection Time: 12/09/19  3:10 PM   Specimen: BLOOD  Result Value Ref Range Status   Specimen Description BLOOD BLOOD RIGHT FOREARM  Final   Special Requests   Final    BOTTLES DRAWN AEROBIC AND ANAEROBIC Blood Culture adequate volume   Culture   Final    NO GROWTH 5 DAYS Performed at [facility], [place] [address], [place], [place] [address]    Report Status 12/14/2019 FINAL  Final     Time coordinating discharge: 40 minutes  SIGNED:   Elmarie Shiley, MD  [facility]

## 2019-12-14 NOTE — Progress Notes (Signed)
Nsg Discharge Note  Admit Date:  12/09/2019 Discharge date: 12/14/2019   Alfred Foster to be D/C'd Home per MD order.  AVS completed.   Patient able to verbalize understanding.  Discharge Medication: Allergies as of 12/14/2019      Reactions   Hydrochlorothiazide    SOB, dizzy   Metformin And Related Other (See Comments)   Make him feel full and uncomfortable      Medication List    STOP taking these medications   COD LIVER OIL PO   losartan 100 MG tablet Commonly known as: COZAAR   spironolactone 25 MG tablet Commonly known as: ALDACTONE     TAKE these medications   amLODipine 10 MG tablet Commonly known as: NORVASC Take 1 tablet (10 mg total) by mouth daily.   apixaban 5 MG Tabs tablet Commonly known as: ELIQUIS Take 2 tablets (10 mg total) by mouth 2 (two) times daily for 6 days.   apixaban 5 MG Tabs tablet Commonly known as: ELIQUIS Take 1 tablet (5 mg total) by mouth 2 (two) times daily. Start taking on: December 20, 2019   aspirin 81 MG tablet Take 81 mg by mouth daily.   dexamethasone 6 MG tablet Commonly known as: DECADRON Take 1 tablet (6 mg total) by mouth daily.   feeding supplement (ENSURE ENLIVE) Liqd Take 237 mLs by mouth 2 (two) times daily between meals.   FISH OIL PO Take 1,000 mg by mouth daily.   folic acid Q000111Q MCG tablet Commonly known as: FOLVITE Take 800 mcg by mouth daily.   guaiFENesin-dextromethorphan 100-10 MG/5ML syrup Commonly known as: ROBITUSSIN DM Take 10 mLs by mouth every 4 (four) hours as needed for cough.   Ipratropium-Albuterol 20-100 MCG/ACT Aers respimat Commonly known as: COMBIVENT Inhale 1 puff into the lungs every 6 (six) hours as needed for wheezing or shortness of breath.   Medical Compression Socks Misc Use daily for leg swelling from DVT.  Dx: I82.461   metoprolol succinate 50 MG 24 hr tablet Commonly known as: TOPROL-XL Take 1 tablet (50 mg total) by mouth daily. Take with or immediately following a  meal. Start taking on: December 15, 2019 What changed:   medication strength  See the new instructions.   niacin 500 MG tablet Take 500 mg by mouth daily.   ONE TOUCH ULTRA TEST test strip Generic drug: glucose blood use as directed   onetouch ultrasoft lancets use as directed   rosuvastatin 10 MG tablet Commonly known as: CRESTOR TAKE 1 TABLET(10 MG) BY MOUTH DAILY What changed: See the new instructions.   sitaGLIPtin 100 MG tablet Commonly known as: JANUVIA Take 1 tablet (100 mg total) by mouth daily.   tiZANidine 2 MG tablet Commonly known as: ZANAFLEX Take 1 tablet (2 mg total) by mouth every 8 (eight) hours as needed for muscle spasms.   VITAMIN C PO Take 1 tablet by mouth daily.   Vitamin D3 25 MCG (1000 UT) Caps Take 1,000 Units by mouth daily.   zinc sulfate 220 (50 Zn) MG capsule Take 1 capsule (220 mg total) by mouth daily. Start taking on: December 15, 2019       Discharge Assessment: Vitals:   12/14/19 0800 12/14/19 1200  BP: (!) 144/88 (!) 154/90  Pulse: 61 67  Resp: 15 13  Temp: 97.9 F (36.6 C) 98 F (36.7 C)  SpO2: 100% 100%   Skin clean, dry and intact without evidence of skin break down, no evidence of skin tears noted.  IV catheter discontinued intact. Site without signs and symptoms of complications - no redness or edema noted at insertion site, patient denies c/o pain - only slight tenderness at site.  Dressing with slight pressure applied.  D/c Instructions-Education: Discharge instructions given to patient with verbalized understanding. D/c education completed with patientincluding follow up instructions, medication list, d/c activities limitations if indicated, with other d/c instructions as indicated by MD - patient able to verbalize understanding, all questions fully answered. Patient instructed to return to ED, call 911, or call MD for any changes in condition.  Patient escorted via [place], and D/C home via private auto.  Hassan Rowan, RN 12/14/2019 4:14 PM

## 2019-12-14 NOTE — Progress Notes (Signed)
OT Screen Note  Patient Details Name: Alfred Foster MRN: [id number] DOB: June 11, 1946   Cancelled Treatment:    Reason Eval/Treat Not Completed: OT screened, no needs identified, will sign off (Pt at baseline for ADLs and mobility.)  [place], OTR/L Acute Rehab Pager: [phone] Office: [phone] 12/14/2019, 3:40 PM

## 2019-12-14 NOTE — Discharge Instructions (Signed)
-Please Quarantine until 12-30-2019.    COVID-19 COVID-19 is a respiratory infection that is caused by a virus called severe acute respiratory syndrome coronavirus 2 (SARS-CoV-2). The disease is also known as coronavirus disease or novel coronavirus. In some people, the virus may not cause any symptoms. In others, it may cause a serious infection. The infection can get worse quickly and can lead to complications, such as:  Pneumonia, or infection of the lungs.  Acute respiratory distress syndrome or ARDS. This is a condition in which fluid build-up in the lungs prevents the lungs from filling with air and passing oxygen into the blood.  Acute respiratory failure. This is a condition in which there is not enough oxygen passing from the lungs to the body or when carbon dioxide is not passing from the lungs out of the body.  Sepsis or septic shock. This is a serious bodily reaction to an infection.  Blood clotting problems.  Secondary infections due to bacteria or fungus.  Organ failure. This is when your body's organs stop working. The virus that causes COVID-19 is contagious. This means that it can spread from person to person through droplets from coughs and sneezes (respiratory secretions). What are the causes? This illness is caused by a virus. You may catch the virus by:  Breathing in droplets from an infected person. Droplets can be spread by a person breathing, speaking, singing, coughing, or sneezing.  Touching something, like a table or a doorknob, that was exposed to the virus (contaminated) and then touching your mouth, nose, or eyes. What increases the risk? Risk for infection You are more likely to be infected with this virus if you:  Are within 6 feet (2 meters) of a person with COVID-19.  Provide care for or live with a person who is infected with COVID-19.  Spend time in crowded indoor spaces or live in shared housing. Risk for serious illness You are more likely to  become seriously ill from the virus if you:  Are 91 years of age or older. The higher your age, the more you are at risk for serious illness.  Live in a nursing home or long-term care facility.  Have cancer.  Have a long-term (chronic) disease such as: ? Chronic lung disease, including chronic obstructive pulmonary disease or asthma. ? A long-term disease that lowers your body's ability to fight infection (immunocompromised). ? Heart disease, including heart failure, a condition in which the arteries that lead to the heart become narrow or blocked (coronary artery disease), a disease which makes the heart muscle thick, weak, or stiff (cardiomyopathy). ? Diabetes. ? Chronic kidney disease. ? Sickle cell disease, a condition in which red blood cells have an abnormal "sickle" shape. ? Liver disease.  Are obese. What are the signs or symptoms? Symptoms of this condition can range from mild to severe. Symptoms may appear any time from 2 to 14 days after being exposed to the virus. They include:  A fever or chills.  A cough.  Difficulty breathing.  Headaches, body aches, or muscle aches.  Runny or stuffy (congested) nose.  A sore throat.  New loss of taste or smell. Some people may also have stomach problems, such as nausea, vomiting, or diarrhea. Other people may not have any symptoms of COVID-19. How is this diagnosed? This condition may be diagnosed based on:  Your signs and symptoms, especially if: ? You live in an area with a COVID-19 outbreak. ? You recently traveled to or from an area  where the virus is common. ? You provide care for or live with a person who was diagnosed with COVID-19. ? You were exposed to a person who was diagnosed with COVID-19.  A physical exam.  Lab tests, which may include: ? Taking a sample of fluid from the back of your nose and throat (nasopharyngeal fluid), your nose, or your throat using a swab. ? A sample of mucus from your lungs  (sputum). ? Blood tests.  Imaging tests, which may include, X-rays, CT scan, or ultrasound. How is this treated? At present, there is no medicine to treat COVID-19. Medicines that treat other diseases are being used on a trial basis to see if they are effective against COVID-19. Your health care provider will talk with you about ways to treat your symptoms. For most people, the infection is mild and can be managed at home with rest, fluids, and over-the-counter medicines. Treatment for a serious infection usually takes places in a hospital intensive care unit (ICU). It may include one or more of the following treatments. These treatments are given until your symptoms improve.  Receiving fluids and medicines through an IV.  Supplemental oxygen. Extra oxygen is given through a tube in the nose, a face mask, or a hood.  Positioning you to lie on your stomach (prone position). This makes it easier for oxygen to get into the lungs.  Continuous positive airway pressure (CPAP) or bi-level positive airway pressure (BPAP) machine. This treatment uses mild air pressure to keep the airways open. A tube that is connected to a motor delivers oxygen to the body.  Ventilator. This treatment moves air into and out of the lungs by using a tube that is placed in your windpipe.  Tracheostomy. This is a procedure to create a hole in the neck so that a breathing tube can be inserted.  Extracorporeal membrane oxygenation (ECMO). This procedure gives the lungs a chance to recover by taking over the functions of the heart and lungs. It supplies oxygen to the body and removes carbon dioxide. Follow these instructions at home: Lifestyle  If you are sick, stay home except to get medical care. Your health care provider will tell you how long to stay home. Call your health care provider before you go for medical care.  Rest at home as told by your health care provider.  Do not use any products that contain nicotine  or tobacco, such as cigarettes, e-cigarettes, and chewing tobacco. If you need help quitting, ask your health care provider.  Return to your normal activities as told by your health care provider. Ask your health care provider what activities are safe for you. General instructions  Take over-the-counter and prescription medicines only as told by your health care provider.  Drink enough fluid to keep your urine pale yellow.  Keep all follow-up visits as told by your health care provider. This is important. How is this prevented?  There is no vaccine to help prevent COVID-19 infection. However, there are steps you can take to protect yourself and others from this virus. To protect yourself:   Do not travel to areas where COVID-19 is a risk. The areas where COVID-19 is reported change often. To identify high-risk areas and travel restrictions, check the CDC travel website: FatFares.com.br  If you live in, or must travel to, an area where COVID-19 is a risk, take precautions to avoid infection. ? Stay away from people who are sick. ? Wash your hands often with soap and water  for 20 seconds. If soap and water are not available, use an alcohol-based hand sanitizer. ? Avoid touching your mouth, face, eyes, or nose. ? Avoid going out in public, follow guidance from your state and local health authorities. ? If you must go out in public, wear a cloth face covering or face mask. Make sure your mask covers your nose and mouth. ? Avoid crowded indoor spaces. Stay at least 6 feet (2 meters) away from others. ? Disinfect objects and surfaces that are frequently touched every day. This may include:  Counters and tables.  Doorknobs and light switches.  Sinks and faucets.  Electronics, such as phones, remote controls, keyboards, computers, and tablets. To protect others: If you have symptoms of COVID-19, take steps to prevent the virus from spreading to others.  If you think you have  a COVID-19 infection, contact your health care provider right away. Tell your health care team that you think you may have a COVID-19 infection.  Stay home. Leave your house only to seek medical care. Do not use public transport.  Do not travel while you are sick.  Wash your hands often with soap and water for 20 seconds. If soap and water are not available, use alcohol-based hand sanitizer.  Stay away from other members of your household. Let healthy household members care for children and pets, if possible. If you have to care for children or pets, wash your hands often and wear a mask. If possible, stay in your own room, separate from others. Use a different bathroom.  Make sure that all people in your household wash their hands well and often.  Cough or sneeze into a tissue or your sleeve or elbow. Do not cough or sneeze into your hand or into the air.  Wear a cloth face covering or face mask. Make sure your mask covers your nose and mouth. Where to find more information  Centers for Disease Control and Prevention: PurpleGadgets.be  World Health Organization: https://www.castaneda.info/ Contact a health care provider if:  You live in or have traveled to an area where COVID-19 is a risk and you have symptoms of the infection.  You have had contact with someone who has COVID-19 and you have symptoms of the infection. Get help right away if:  You have trouble breathing.  You have pain or pressure in your chest.  You have confusion.  You have bluish lips and fingernails.  You have difficulty waking from sleep.  You have symptoms that get worse. These symptoms may represent a serious problem that is an emergency. Do not wait to see if the symptoms will go away. Get medical help right away. Call your local emergency services (911 in the [place] Do not drive yourself to the hospital. Let the emergency medical personnel know if you think you have  COVID-19. Summary  COVID-19 is a respiratory infection that is caused by a virus. It is also known as coronavirus disease or novel coronavirus. It can cause serious infections, such as pneumonia, acute respiratory distress syndrome, acute respiratory failure, or sepsis.  The virus that causes COVID-19 is contagious. This means that it can spread from person to person through droplets from breathing, speaking, singing, coughing, or sneezing.  You are more likely to develop a serious illness if you are 65 years of age or older, have a weak immune system, live in a nursing home, or have chronic disease.  There is no medicine to treat COVID-19. Your health care provider will talk with  you about ways to treat your symptoms.  Take steps to protect yourself and others from infection. Wash your hands often and disinfect objects and surfaces that are frequently touched every day. Stay away from people who are sick and wear a mask if you are sick. This information is not intended to replace advice given to you by your health care provider. Make sure you discuss any questions you have with your health care provider. Document Revised: 09/27/2019 Document Reviewed: 01/03/2019 Elsevier Patient Education  [place] on my medicine - ELIQUIS (apixaban)  Why was Eliquis prescribed for you? Eliquis was prescribed to treat blood clots that may have been found in the veins of your legs (deep vein thrombosis) or in your lungs (pulmonary embolism) and to reduce the risk of them occurring again.  What do You need to know about Eliquis ? The starting dose is 10 mg (two 5 mg tablets) taken TWICE daily for the FIRST SEVEN (7) DAYS, then on 12/20/2019 the dose is reduced to ONE 5 mg tablet taken TWICE daily.  Eliquis may be taken with or without food.   Try to take the dose about the same time in the morning and in the evening. If you have difficulty swallowing the tablet whole please  discuss with your pharmacist how to take the medication safely.  Take Eliquis exactly as prescribed and DO NOT stop taking Eliquis without talking to the doctor who prescribed the medication.  Stopping may increase your risk of developing a new blood clot.  Refill your prescription before you run out.  After discharge, you should have regular check-up appointments with your healthcare provider that is prescribing your Eliquis.    What do you do if you miss a dose? If a dose of ELIQUIS is not taken at the scheduled time, take it as soon as possible on the same day and twice-daily administration should be resumed. The dose should not be doubled to make up for a missed dose.  Important Safety Information A possible side effect of Eliquis is bleeding. You should call your healthcare provider right away if you experience any of the following: ? Bleeding from an injury or your nose that does not stop. ? Unusual colored urine (red or dark brown) or unusual colored stools (red or black). ? Unusual bruising for unknown reasons. ? A serious fall or if you hit your head (even if there is no bleeding).  Some medicines may interact with Eliquis and might increase your risk of bleeding or clotting while on Eliquis. To help avoid this, consult your healthcare provider or pharmacist prior to using any new prescription or non-prescription medications, including herbals, vitamins, non-steroidal anti-inflammatory drugs (NSAIDs) and supplements.  This website has more information on Eliquis (apixaban): http://www.eliquis.com/eliquis/home

## 2019-12-16 ENCOUNTER — Telehealth: Payer: Self-pay | Admitting: *Deleted

## 2019-12-16 NOTE — Telephone Encounter (Signed)
Pt is on the TCM report and needing hosp f/u, but he was dx w/covid. Was told to f/u w/provider 7-14 days if pt can't do an virtual will it still be ok do in person.Marland KitchenJohny Chess

## 2019-12-16 NOTE — Telephone Encounter (Signed)
If he absolutely can not do virtual or facetime then he can do a phone call.

## 2019-12-17 NOTE — Telephone Encounter (Signed)
Called pt concerning hosp discharge. Pt is not able to do an virtual so he will have phone visit 12/19/19. Completed TCM below.Alfred Foster  Transition Care Management Follow-up Telephone Call   Date discharged? 12/14/19   How have you been since you were released from the hospital? Pt states he is doing much better   Do you understand why you were in the hospital? YES   Do you understand the discharge instructions? YES   Where were you discharged to? HOME   Items Reviewed:  Medications reviewed: YES, He is to hold BP med until he f/u w/provider. He states not sure what its running have not checked since he left hosp  Allergies reviewed: YES  Dietary changes reviewed: YES, heart healthy. He states his appetite is alright  Referrals reviewed: No referral recommeded   Functional Questionnaire:   Activities of Daily Living (ADLs):   He states he are independent in the following: ambulation, bathing and hygiene, feeding, continence, grooming, toileting and dressing   States he doesn't  require assistance    Any transportation issues/concerns?: NO   Any patient concerns? NO   Confirmed importance and date/time of follow-up visits scheduled YES, Phone visit 12/19/19  Provider Appointment booked with Dr. Quay Burow  Confirmed with patient if condition begins to worsen call PCP or go to the ER.  Patient was given the office number and encouraged to call back with question or concerns.  : YES

## 2019-12-18 NOTE — Progress Notes (Signed)
Virtual Visit via telephone Note  I connected with Alfred Foster on 12/19/19 at 10:00 AM EST by telephone and verified that I am speaking with the correct person using two identifiers.   I discussed the limitations of evaluation and management by telemedicine and the availability of in person appointments. The patient expressed understanding and agreed to proceed.  Present for the visit:  Myself, Dr Billey Gosling, Lupe Carney.  The patient is currently at home and I am in the office.    No referring provider.    History of Present Illness: He is here for follow up from the hospital.   Admitted  12/09/19 - 12/14/2019  Obtain cbc, bmp in one week. Will need life long a/c for presumed PE and chornic DVT LE Follow renal function and consider resuming cozaar and or spironolactone  He went to the ED for worsening dry cough, SOB, low grade fever, lethargy and loose stools.  COVID positive.  Hypoxic in the ED and needed 2 L oxygen. Found to have AKI which improved with IVF.  VQ scan ordered on 12/29 high probability for PE.  Started on heparin drip and then transitioned to eliqius.   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 right posterior tibial veins and right peroneal veins and right femoral vein Initially on heparin drip - then d/c's on eliquis   Probably PE, chronic DVT right popliteal vein, right femoral vein: VQ scan w high probability for PE elevated d- dimer Treated with heparin drip for 48 hrs Needs long term a/c due to chronic DVT Discharged on eliquis  AKI d/t poor oral intake Baseline Cr 1.2-1.4 Losartan held Spironolactone held May need above restarted Received IVF  Hypertension Continue on amlodipine Metoprolol dose decreased due to low  HR  hyperlipidemia Continued on statin  Diabetes: Received SS Discharged on home meds  Metabolic acidosis: IVF, improved  Diarrhea: Imodium prn Improved   He states he is doing much better since being discharged 5 days ago.  He is quarantining at home alone and was advised that he needs to quarantine for 21 days.  His wife is currently in the hospital.  His son is getting food for him and bringing it to his house.  He states his appetite is improving and he is drinking plenty of fluids.  His taste is coming back, but not normal.  He is still experiencing cough and wheeze and is using the Combivent inhaler, which is helping.  He is also taking Robitussin cough syrup as needed.  He has completed the steroids.  He has shortness of breath with certain activities, but it has improved.  He has not had any fevers.   His BMs are now solid.  He has not needed the Imodium.  He was found to have a chronic right lower extremity DVT and concern for probable PE.  He is taking his Eliquis as prescribed.  He denies any side effects and he did tolerate this well last year.  He checked his blood pressure once, but does not recall what the reading was.  He does feel he is drinking plenty of fluids here.  He is taking his medications as prescribed.   Review of Systems  Constitutional: Negative for chills and fever.  HENT:  Taste is better - still decreased  Respiratory: Positive for cough, shortness of breath (sometimes with certain activities - improving) and wheezing.   Cardiovascular: Positive for leg swelling (RLE - mild). Negative for chest pain and palpitations.  Gastrointestinal: Negative for abdominal pain, constipation, diarrhea and nausea.  Neurological: Negative for dizziness and headaches.      Social History   Socioeconomic History  . Marital status: Married    Spouse name: Not on file  . Number of children: Not on file  . Years of education: Not on file  . Highest  education level: Not on file  Occupational History  . Not on file  Tobacco Use  . Smoking status: Never Smoker  . Smokeless tobacco: Never Used  Substance and Sexual Activity  . Alcohol use: No  . Drug use: No  . Sexual activity: Not on file  Other Topics Concern  . Not on file  Social History Narrative  . Not on file   Social Determinants of Health   Financial Resource Strain:   . Difficulty of Paying Living Expenses: Not on file  Food Insecurity:   . Worried About Charity fundraiser in the Last Year: Not on file  . Ran Out of Food in the Last Year: Not on file  Transportation Needs:   . Lack of Transportation (Medical): Not on file  . Lack of Transportation (Non-Medical): Not on file  Physical Activity:   . Days of Exercise per Week: Not on file  . Minutes of Exercise per Session: Not on file  Stress:   . Feeling of Stress : Not on file  Social Connections:   . Frequency of Communication with Friends and Family: Not on file  . Frequency of Social Gatherings with Friends and Family: Not on file  . Attends Religious Services: Not on file  . Active Member of Clubs or Organizations: Not on file  . Attends Archivist Meetings: Not on file  . Marital Status: Not on file     Observations/Objective:  Lab Results  Component Value Date   WBC 11.7 (H) 12/14/2019   HGB 13.3 12/14/2019   HCT 38.4 (L) 12/14/2019   PLT 256 12/14/2019   GLUCOSE 165 (H) 12/14/2019   CHOL 113 12/09/2019   TRIG 89 12/09/2019   HDL 19 (L) 12/09/2019   LDLCALC 76 12/09/2019   ALT 23 12/14/2019   AST 18 12/14/2019   NA 140 12/14/2019   K 3.8 12/14/2019   CL 114 (H) 12/14/2019   CREATININE 1.16 12/14/2019   BUN 25 (H) 12/14/2019   CO2 20 (L) 12/14/2019   TSH 1.35 09/21/2018   PSA 0.00 Repeated and verified X2. (L) 09/15/2017   INR 1.01 12/14/2012   HGBA1C 7.1 (H) 12/09/2019   MICROALBUR 1.1 09/13/2016    VAS [place] LOWER EXTREMITY VENOUS (DVT)  Lower Venous Study  Indications:  Positive D dimer with Covid 19. History of right leg DVT on 09/12/18.   Anticoagulation: Lovenox. Performing Technologist: Oda Cogan RDMS, RVT    Examination Guidelines: A complete evaluation includes B-mode imaging, spectral Doppler, color Doppler, and power Doppler as needed of all accessible portions of each vessel. Bilateral testing is considered an integral part of a complete examination. Limited examinations for reoccurring indications may be performed as noted.    +---------+---------------+---------+-----------+----------+--------------+ RIGHT    CompressibilityPhasicitySpontaneityPropertiesThrombus Aging +---------+---------------+---------+-----------+----------+--------------+ CFV      Full           Yes  Yes                                 +---------+---------------+---------+-----------+----------+--------------+ SFJ      Full                                                        +---------+---------------+---------+-----------+----------+--------------+ FV Prox  Full                                                        +---------+---------------+---------+-----------+----------+--------------+ FV Mid   Full                                                        +---------+---------------+---------+-----------+----------+--------------+ FV DistalPartial                                      Chronic        +---------+---------------+---------+-----------+----------+--------------+ PFV      Full                                                        +---------+---------------+---------+-----------+----------+--------------+ POP      Partial        Yes      Yes                  Chronic        +---------+---------------+---------+-----------+----------+--------------+ PTV      Partial                                      Chronic         +---------+---------------+---------+-----------+----------+--------------+ PERO     Partial                                      Chronic        +---------+---------------+---------+-----------+----------+--------------+        +---------+---------------+---------+-----------+----------+--------------+ LEFT     CompressibilityPhasicitySpontaneityPropertiesThrombus Aging +---------+---------------+---------+-----------+----------+--------------+ CFV      Full           Yes      Yes                                 +---------+---------------+---------+-----------+----------+--------------+ SFJ      Full                                                        +---------+---------------+---------+-----------+----------+--------------+  FV Prox  Full                                                        +---------+---------------+---------+-----------+----------+--------------+ FV Mid   Full                                                        +---------+---------------+---------+-----------+----------+--------------+ FV DistalFull                                                        +---------+---------------+---------+-----------+----------+--------------+ PFV      Full                                                        +---------+---------------+---------+-----------+----------+--------------+ POP      Full           Yes      Yes                                 +---------+---------------+---------+-----------+----------+--------------+ PTV      Full                                                        +---------+---------------+---------+-----------+----------+--------------+ PERO     Full                                                        +---------+---------------+---------+-----------+----------+--------------+          Summary: Right: Findings consistent with chronic deep vein thrombosis  involving the right popliteal vein, right posterior tibial veins, right peroneal veins, and right femoral vein. Left: There is no evidence of deep vein thrombosis in the lower extremity.   *See table(s) above for measurements and observations.  Electronically signed by Monica Martinez MD on 12/11/2019 at 5:44:59 PM.      Final      Assessment and Plan:  See Problem List for Assessment and Plan of chronic medical problems.   Follow Up Instructions:    I discussed the assessment and treatment plan with the patient. The patient was provided an opportunity to ask questions and all were answered. The patient agreed with the plan and demonstrated an understanding of the instructions.   The patient was advised to call back or seek an in-person evaluation if the symptoms worsen or if the condition fails to improve as anticipated.  Time spent on telephone call:  23 minutes  Binnie Rail, MD

## 2019-12-19 ENCOUNTER — Other Ambulatory Visit: Payer: Self-pay

## 2019-12-19 ENCOUNTER — Ambulatory Visit (INDEPENDENT_AMBULATORY_CARE_PROVIDER_SITE_OTHER): Payer: Medicare Other | Admitting: Internal Medicine

## 2019-12-19 ENCOUNTER — Encounter: Payer: Self-pay | Admitting: Internal Medicine

## 2019-12-19 DIAGNOSIS — I10 Essential (primary) hypertension: Secondary | ICD-10-CM

## 2019-12-19 DIAGNOSIS — E782 Mixed hyperlipidemia: Secondary | ICD-10-CM | POA: Diagnosis not present

## 2019-12-19 DIAGNOSIS — J9601 Acute respiratory failure with hypoxia: Secondary | ICD-10-CM

## 2019-12-19 DIAGNOSIS — N179 Acute kidney failure, unspecified: Secondary | ICD-10-CM

## 2019-12-19 DIAGNOSIS — U071 COVID-19: Secondary | ICD-10-CM

## 2019-12-19 DIAGNOSIS — I82511 Chronic embolism and thrombosis of right femoral vein: Secondary | ICD-10-CM

## 2019-12-19 DIAGNOSIS — E119 Type 2 diabetes mellitus without complications: Secondary | ICD-10-CM | POA: Diagnosis not present

## 2019-12-19 NOTE — Assessment & Plan Note (Signed)
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

## 2019-12-19 NOTE — Assessment & Plan Note (Signed)
Chronic, stable. Continue statin.  

## 2019-12-19 NOTE — Assessment & Plan Note (Signed)
Recovered His oxygenation prior to discharge was very good-unable to check because of a virtual visit today Has some residual shortness of breath with certain activities, but overall improved Still has some cough and wheeze-improved with inhaler Has follow-up with me later this month

## 2019-12-19 NOTE — Assessment & Plan Note (Signed)
Resolved while he was in the hospital with IV fluids Kidney function when he was discharged was normal Does needed to be rechecked, but not able to check at this time because he is quarantining because of his positive Covid test-we will check at the end of the month at his appointment Continue increase fluids We will continue to hold losartan and spironolactone unless his BP is elevated-he will start monitoring it regularly

## 2019-12-19 NOTE — Assessment & Plan Note (Signed)
Context Diabetic diet stressed Continue Januvia 100 mg daily We will check A1c at his upcoming visit later this month

## 2019-12-19 NOTE — Assessment & Plan Note (Signed)
Chronic Stressed that he needs to start monitoring his blood pressure because his losartan and spironolactone were discontinued secondary to AKI and most likely will need to be restarted He will let me know if his blood pressure is more than 140/90 Since he was recently tested positive for Covid and needs to quarantine he is not able to come to the office for blood work we will need to wait until the end of the month at his appointment and will get blood work then Kidney function looked good upon discharge, so his blood pressure is elevated I will restart 1 or both of his medications

## 2019-12-19 NOTE — Patient Outreach (Signed)
[facility]) Care Management  12/19/2019  Alfred Foster [birth year] [id number]   Medication Adherence call to [name] Compliant Voice message left with a call back number. [name] is showing past due on Metformin 500 mg under [place].   [place] Management Direct Dial [phone]  Fax [phone] [email or web address]

## 2019-12-23 ENCOUNTER — Other Ambulatory Visit: Payer: Self-pay | Admitting: *Deleted

## 2019-12-23 ENCOUNTER — Encounter: Payer: Self-pay | Admitting: *Deleted

## 2019-12-23 ENCOUNTER — Telehealth: Payer: Self-pay

## 2019-12-23 NOTE — Telephone Encounter (Signed)
FYI

## 2019-12-23 NOTE — Telephone Encounter (Signed)
Copied from [place] [id number]. Topic: General - Inquiry >> Dec 23, 2019  2:34 PM Virl Axe D wrote: Reason for CRM: Pt stated he has another FMLA form for Dr. Quay Burow to fill out for his employer regarding his wife who is also a pt of hers. He will bring by on tomorrow. Dr. Quay Burow has filled out before last month.

## 2019-12-23 NOTE — Telephone Encounter (Signed)
Noted  

## 2019-12-23 NOTE — Patient Outreach (Signed)
Telephone outreach for a red flag on his post discharge Emmi call: Unsure about new prescriptions.  Unable to talk to pt, left a message and requested a call back.  [name]. [name], MSN, GNP-BC Gerontological Nurse Practitioner [facility] Care Management [phone]  Mr. Musco returned my call. He reports he is much better. General overall improvement, denies SOB, has an occasional nonproductive cough. Denies leg pain and he is wearing his support stockings.  He is in quarantine. He does report he has all his medications and we did a medication reconciliation (pt has stopped losartan and spironolactone has started amlodipine 10 mg and Eliquis 10 mg bid. He has had a virtual visit with Dr. Quay Burow. He will follow up with her again at the end of the month.  Of note he is supposed to have lab work(per his hospital discharge note) but has not done this. He will need orders from Dr. Quay Burow for the recommended BMP/CBC.  He has agreed to participate in our disease management program.  I will call him again in 2 weeks.  Patient was recently discharged from hospital and all medications have been reviewed. Outpatient Encounter Medications as of 12/23/2019  Medication Sig  . amLODipine (NORVASC) 10 MG tablet Take 1 tablet (10 mg total) by mouth daily.  Marland Kitchen apixaban (ELIQUIS) 5 MG TABS tablet Take 1 tablet (5 mg total) by mouth 2 (two) times daily.  . Ascorbic Acid (VITAMIN C PO) Take 1 tablet by mouth daily.   Marland Kitchen aspirin 81 MG tablet Take 81 mg by mouth daily.  . Cholecalciferol (VITAMIN D3) 1000 UNITS CAPS Take 1,000 Units by mouth daily.  . 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  Dx: [id number]  . feeding supplement, ENSURE ENLIVE, (ENSURE ENLIVE) LIQD Take 237 mLs by mouth 2 (two) times daily between meals.  . folic acid (FOLVITE) [id number] MCG tablet Take 800 mcg by mouth daily.   Marland Kitchen guaiFENesin-dextromethorphan (ROBITUSSIN DM) 100-10 MG/5ML syrup Take 10  mLs by mouth every 4 (four) hours as needed for cough.  . Ipratropium-Albuterol (COMBIVENT) 20-100 MCG/ACT AERS respimat Inhale 1 puff into the lungs every 6 (six) hours as needed for wheezing or shortness of breath.  . Lancets (ONETOUCH ULTRASOFT) lancets use as directed  . metoprolol succinate (TOPROL-XL) 50 MG 24 hr tablet Take 1 tablet (50 mg total) by mouth daily. Take with or immediately following a meal.  . niacin 500 MG tablet Take 500 mg by mouth daily.   . Omega-3 Fatty Acids (FISH OIL PO) Take 1,000 mg by mouth daily.   . ONE TOUCH ULTRA TEST test strip use as directed  . rosuvastatin (CRESTOR) 10 MG tablet TAKE 1 TABLET(10 MG) BY MOUTH DAILY (Patient taking differently: Take 10 mg by mouth daily. )  . sitaGLIPtin (JANUVIA) 100 MG tablet Take 1 tablet (100 mg total) by mouth daily.  Marland Kitchen tiZANidine (ZANAFLEX) 2 MG tablet Take 1 tablet (2 mg total) by mouth every 8 (eight) hours as needed for muscle spasms.  Marland Kitchen zinc sulfate 220 (50 Zn) MG capsule Take 1 capsule (220 mg total) by mouth daily.   No facility-administered encounter medications on file as of 12/23/2019.   PT GOAL TO HAVE LABS DONE AND FOLLOW UP WITH DR. BURNS THIS MONTH.  [name]. [name], MSN, [facility] Gerontological Nurse Practitioner [facility] Care Management [phone]

## 2019-12-25 DIAGNOSIS — Z0279 Encounter for issue of other medical certificate: Secondary | ICD-10-CM

## 2019-12-31 ENCOUNTER — Ambulatory Visit: Payer: Medicare Other | Attending: Internal Medicine

## 2019-12-31 DIAGNOSIS — Z20822 Contact with and (suspected) exposure to covid-19: Secondary | ICD-10-CM | POA: Diagnosis not present

## 2020-01-01 LAB — NOVEL CORONAVIRUS, NAA: SARS-CoV-2, NAA: NOT DETECTED

## 2020-01-03 ENCOUNTER — Telehealth: Payer: Self-pay | Admitting: Internal Medicine

## 2020-01-03 NOTE — Telephone Encounter (Signed)
Negative COVID results given. Patient results "NOT Detected." Caller expressed understanding. ° °

## 2020-01-06 ENCOUNTER — Other Ambulatory Visit: Payer: Self-pay | Admitting: *Deleted

## 2020-01-06 ENCOUNTER — Ambulatory Visit: Payer: Medicare Other | Admitting: Internal Medicine

## 2020-01-06 NOTE — Patient Outreach (Signed)
Telephone outreach.  Alfred Foster reports he is doing very well. He is going back to work Architectural technologist. He just had another COVID test and it was negative and he has been cleared to return to work.   He will see Dr. Quay Burow on Friday this week. He will need to have labs that were ordered from the hospital. (BMP and CBC).  We discussed his glucose control: Last HgbA1C was 7.1. He reports his fasting levels run 110-120 and his NFBSs are 130's.   Discussed his medication list again and he wanted to know what were the water pills he had taken in the past. Chart review in Epic reveals he had side effects from HCTZ (dizziness and SOB). Spironolactone was discontinued in the hospital..  Discussed precautionary measure against new COVID exposure. Will need to still wear a mask, keep 6 ft away from others and wash hands frequently and use hand sanitizer.  Agreed that NP will call him again in 3 months for follow up. Encouraged him to call me if he has any questions.  [name]. [name], MSN, [facility] Gerontological Nurse Practitioner [facility] Care Management [phone]

## 2020-01-09 NOTE — Patient Instructions (Addendum)
  Blood work was ordered.     Medications reviewed and updated.  Changes include :  Start losartan 50 mg daily.      Please followup in 2 weeks

## 2020-01-10 ENCOUNTER — Encounter: Payer: Self-pay | Admitting: Internal Medicine

## 2020-01-10 ENCOUNTER — Other Ambulatory Visit: Payer: Self-pay

## 2020-01-10 ENCOUNTER — Ambulatory Visit (INDEPENDENT_AMBULATORY_CARE_PROVIDER_SITE_OTHER): Payer: Medicare Other | Admitting: Internal Medicine

## 2020-01-10 VITALS — BP 148/82 | HR 78 | Temp 98.6°F | Resp 16 | Ht 73.0 in | Wt 213.6 lb

## 2020-01-10 DIAGNOSIS — I10 Essential (primary) hypertension: Secondary | ICD-10-CM | POA: Diagnosis not present

## 2020-01-10 DIAGNOSIS — E119 Type 2 diabetes mellitus without complications: Secondary | ICD-10-CM | POA: Diagnosis not present

## 2020-01-10 DIAGNOSIS — I82511 Chronic embolism and thrombosis of right femoral vein: Secondary | ICD-10-CM | POA: Diagnosis not present

## 2020-01-10 DIAGNOSIS — Z8546 Personal history of malignant neoplasm of prostate: Secondary | ICD-10-CM | POA: Diagnosis not present

## 2020-01-10 DIAGNOSIS — E782 Mixed hyperlipidemia: Secondary | ICD-10-CM | POA: Diagnosis not present

## 2020-01-10 LAB — CBC WITH DIFFERENTIAL/PLATELET
Basophils Absolute: 0 10*3/uL (ref 0.0–0.1)
Basophils Relative: 0.8 % (ref 0.0–3.0)
Eosinophils Absolute: 0.3 10*3/uL (ref 0.0–0.7)
Eosinophils Relative: 5.5 % — ABNORMAL HIGH (ref 0.0–5.0)
HCT: 40.1 % (ref 39.0–52.0)
Hemoglobin: 13.3 g/dL (ref 13.0–17.0)
Lymphocytes Relative: 31.1 % (ref 12.0–46.0)
Lymphs Abs: 1.9 10*3/uL (ref 0.7–4.0)
MCHC: 33.2 g/dL (ref 30.0–36.0)
MCV: 95 fl (ref 78.0–100.0)
Monocytes Absolute: 0.9 10*3/uL (ref 0.1–1.0)
Monocytes Relative: 14.3 % — ABNORMAL HIGH (ref 3.0–12.0)
Neutro Abs: 3 10*3/uL (ref 1.4–7.7)
Neutrophils Relative %: 48.3 % (ref 43.0–77.0)
Platelets: 331 10*3/uL (ref 150.0–400.0)
RBC: 4.22 Mil/uL (ref 4.22–5.81)
RDW: 14.3 % (ref 11.5–15.5)
WBC: 6.2 10*3/uL (ref 4.0–10.5)

## 2020-01-10 LAB — COMPREHENSIVE METABOLIC PANEL
ALT: 14 U/L (ref 0–53)
AST: 16 U/L (ref 0–37)
Albumin: 4 g/dL (ref 3.5–5.2)
Alkaline Phosphatase: 95 U/L (ref 39–117)
BUN: 23 mg/dL (ref 6–23)
CO2: 25 mEq/L (ref 19–32)
Calcium: 9.2 mg/dL (ref 8.4–10.5)
Chloride: 107 mEq/L (ref 96–112)
Creatinine, Ser: 1.31 mg/dL (ref 0.40–1.50)
GFR: 64.99 mL/min (ref >60.00)
Glucose, Bld: 123 mg/dL — ABNORMAL HIGH (ref 70–99)
Potassium: 4.5 mEq/L (ref 3.5–5.1)
Sodium: 139 mEq/L (ref 135–145)
Total Bilirubin: 0.5 mg/dL (ref 0.2–1.2)
Total Protein: 6.8 g/dL (ref 6.0–8.3)

## 2020-01-10 LAB — LIPID PANEL
Cholesterol: 124 mg/dL (ref 0–200)
HDL: 37.8 mg/dL — ABNORMAL LOW (ref >39.00)
LDL Cholesterol: 76 mg/dL (ref 0–99)
NonHDL: 86.59
Total CHOL/HDL Ratio: 3
Triglycerides: 52 mg/dL (ref 0.0–149.0)
VLDL: 10.4 mg/dL (ref 0.0–40.0)

## 2020-01-10 LAB — MICROALBUMIN / CREATININE URINE RATIO
Creatinine,U: 191.8 mg/dL
Microalb Creat Ratio: 0.9 mg/g (ref 0.0–30.0)
Microalb, Ur: 1.7 mg/dL (ref 0.0–1.9)

## 2020-01-10 LAB — HEMOGLOBIN A1C: Hgb A1c MFr Bld: 7.4 % — ABNORMAL HIGH (ref 4.6–6.5)

## 2020-01-10 LAB — PSA, MEDICARE: PSA: 0 ng/ml — ABNORMAL LOW (ref 0.10–4.00)

## 2020-01-10 MED ORDER — LOSARTAN POTASSIUM 50 MG PO TABS: 50.0000 mg | ORAL_TABLET | Freq: Every day | ORAL | 3 refills | Status: DC

## 2020-01-10 NOTE — Assessment & Plan Note (Signed)
Chronic DVT right lower extremity Needs anticoagulation for life-Eliquis 5 mg twice daily.  He is only been taking medication once daily and we reviewed this that he does need to be taking it twice daily and he will increase it CMP, CBC

## 2020-01-10 NOTE — Assessment & Plan Note (Signed)
Chronic Blood pressure not ideally controlled Continue metoprolol and amlodipine at current dose Restart losartan at 50 mg daily Encouraged him to keep his weight down-he did lose weight with Covid and then has regained it, which he feels is a good thing, but advised he does not want to gain any more weight and is better to have his weight a little lower Low-sodium diet stressed Monitor BP at home with possible Follow-up in 2 weeks CMP

## 2020-01-10 NOTE — Assessment & Plan Note (Signed)
H/o prostate CA No longer seeing urology Check psa - will monitor

## 2020-01-10 NOTE — Assessment & Plan Note (Signed)
Chronic Check lipid panel  Continue daily statin Regular exercise and healthy diet encouraged  

## 2020-01-10 NOTE — Progress Notes (Signed)
Subjective:    Patient ID: Alfred Foster, male    DOB: 05-09-1946, 74 y.o.   MRN: [id number]  HPI The patient is here for follow up of their chronic medical problems, including hypertension, chronic DVT, recent COVID infection, diabetes, hyperlipidemia, h/o prostate ca  He is taking all of his medications as prescribed.    He is active, but not exercising regularly.     He is not monitoring his blood pressure at home.  When he was discharged from the hospital after his Covid infection a couple of his blood pressure medications were discontinued.  He feels he is recovering well from Covid and has only an occasional dry cough.  He states he was advised to take only 1 Eliquis a day upon discharge.  After reviewing his discharge paperwork he was supposed to go from 2 pills twice daily to 1 pill twice daily.    Medications and allergies reviewed with patient and updated if appropriate.  Patient Active Problem List   Diagnosis Date Noted  . Side pain 09/06/2019  . 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 Carotid artery disease ([place]) 10/21/2017  . Right carotid bruit 09/15/2017  . Left shoulder pain 02/17/2016  . Lipoma of skin of abdomen 03/19/2015  . Nonspecific abnormal electrocardiogram (ECG) (EKG) 03/15/2012  . Essential hypertension 10/22/2010  . PROSTATE CANCER, HX OF 01/23/2009  . Diabetes mellitus ([place]) 08/09/2007  . Hyperlipidemia 05/08/2007  . Acute hypoxemic respiratory failure due to COVID-19 [facility]) 2007  . AKI (acute kidney injury) ([place]) 2007    Current Outpatient Medications on File Prior to Visit  Medication Sig Dispense Refill  . amLODipine (NORVASC) 10 MG tablet Take 1 tablet (10 mg total) by mouth daily. 90 tablet 3  . apixaban (ELIQUIS) 5 MG TABS tablet Take 1 tablet (5 mg total) by mouth 2 (two) times daily. 60 tablet 3  . Ascorbic Acid (VITAMIN C PO) Take 1 tablet by mouth daily.     Marland Kitchen aspirin 81 MG tablet Take 81 mg by mouth daily.     . Cholecalciferol (VITAMIN D3) 1000 UNITS CAPS Take 1,000 Units by mouth daily.    . 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  Dx: I82.461 2 each 0  . feeding supplement, ENSURE ENLIVE, (ENSURE ENLIVE) LIQD Take 237 mLs by mouth 2 (two) times daily between meals. 123XX123 mL 12  . folic acid (FOLVITE) Q000111Q MCG tablet Take 800 mcg by mouth daily.     . Ipratropium-Albuterol (COMBIVENT) 20-100 MCG/ACT AERS respimat Inhale 1 puff into the lungs every 6 (six) hours as needed for wheezing or shortness of breath. 4 g 0  . Lancets (ONETOUCH ULTRASOFT) lancets use as directed 100 each 1  . metoprolol succinate (TOPROL-XL) 50 MG 24 hr tablet Take 1 tablet (50 mg total) by mouth daily. Take with or immediately following a meal. 30 tablet 0  . niacin 500 MG tablet Take 500 mg by mouth daily.     . Omega-3 Fatty Acids (FISH OIL PO) Take 1,000 mg by mouth daily.     . ONE TOUCH ULTRA TEST test strip use as directed 100 each 1  . rosuvastatin (CRESTOR) 10 MG tablet TAKE 1 TABLET(10 MG) BY MOUTH DAILY (Patient taking differently: Take 10 mg by mouth daily. ) 90 tablet 1  . sitaGLIPtin (JANUVIA) 100 MG tablet Take 1 tablet (100 mg total) by mouth daily. 30 tablet 5  .  zinc sulfate 220 (50 Zn) MG capsule Take 1 capsule (220 mg total) by mouth daily. 30 capsule 0   No current facility-administered medications on file prior to visit.    Past Medical History:  Diagnosis Date  . Diabetes mellitus   . Hyperlipidemia   . Hypertension   . Prostate cancer [facility]) 2007   Dr Alinda Money    Past Surgical History:  Procedure Laterality Date  . COLONOSCOPY  2004 & 2014   Negative ;[facility]  . INGUINAL HERNIA REPAIR    . PROSTATECTOMY  04/2006   Dr Alinda Money  . UMBILICAL HERNIA REPAIR      Social History   Socioeconomic History  . Marital status: Married    Spouse name: Not on file  . Number of children: Not on file  . Years of education: Not on file  .  Highest education level: Not on file  Occupational History  . Not on file  Tobacco Use  . Smoking status: Never Smoker  . Smokeless tobacco: Never Used  Substance and Sexual Activity  . Alcohol use: No  . Drug use: No  . Sexual activity: Not on file  Other Topics Concern  . Not on file  Social History Narrative  . Not on file   Social Determinants of Health   Financial Resource Strain:   . Difficulty of Paying Living Expenses: Not on file  Food Insecurity:   . Worried About Charity fundraiser in the Last Year: Not on file  . Ran Out of Food in the Last Year: Not on file  Transportation Needs:   . Lack of Transportation (Medical): Not on file  . Lack of Transportation (Non-Medical): Not on file  Physical Activity:   . Days of Exercise per Week: Not on file  . Minutes of Exercise per Session: Not on file  Stress:   . Feeling of Stress : Not on file  Social Connections:   . Frequency of Communication with Friends and Family: Not on file  . Frequency of Social Gatherings with Friends and Family: Not on file  . Attends Religious Services: Not on file  . Active Member of Clubs or Organizations: Not on file  . Attends Archivist Meetings: Not on file  . Marital Status: Not on file    Family History  Problem Relation Age of Onset  . Diabetes Father   . Hypertension Father   . Alzheimer's disease Father   . Hypertension Mother   . Alzheimer's disease Mother   . Heart attack Mother 14  . Diabetes Brother   . Heart disease Brother   . Heart attack Maternal Aunt        in 26s  . Stroke Neg Hx     Review of Systems  Constitutional: Negative for chills and fever.  Respiratory: Positive for cough (only on occ, dry). Negative for shortness of breath and wheezing.   Cardiovascular: Negative for chest pain, palpitations and leg swelling.  Neurological: Negative for light-headedness and headaches.       Objective:   Vitals:   01/10/20 0811  BP: (!) 148/82    Pulse: 78  Resp: 16  Temp: 98.6 F (37 C)  SpO2: 99%   BP Readings from Last 3 Encounters:  01/10/20 (!) 148/82  12/14/19 (!) 154/90  10/04/19 (!) 156/100   Wt Readings from Last 3 Encounters:  01/10/20 213 lb 9.6 oz (96.9 kg)  12/10/19 202 lb 13.2 oz (92 kg)  10/04/19 217 lb (  98.4 kg)   Body mass index is 28.18 kg/m.   Physical Exam    Constitutional: Appears well-developed and well-nourished. No distress.  HENT:  Head: Normocephalic and atraumatic.  Neck: Neck supple. No tracheal deviation present. No thyromegaly present.  No cervical lymphadenopathy Cardiovascular: Normal rate, regular rhythm and normal heart sounds.   No murmur heard. No carotid bruit .  No edema Pulmonary/Chest: Effort normal and breath sounds normal. No respiratory distress. No has no wheezes. No rales.  Skin: Skin is warm and dry. Not diaphoretic.  Psychiatric: Normal mood and affect. Behavior is normal.      Assessment & Plan:    See Problem List for Assessment and Plan of chronic medical problems.    This visit occurred during the SARS-CoV-2 public health emergency.  Safety protocols were in place, including screening questions prior to the visit, additional usage of staff PPE, and extensive cleaning of exam room while observing appropriate contact time as indicated for disinfecting solutions.

## 2020-01-10 NOTE — Assessment & Plan Note (Signed)
Chronic Check A1c, urine microalbumin Taking Januvia We will adjust medication if needed

## 2020-01-11 ENCOUNTER — Other Ambulatory Visit: Payer: Self-pay | Admitting: Internal Medicine

## 2020-01-11 MED ORDER — FARXIGA 5 MG PO TABS: 5.0000 mg | ORAL_TABLET | Freq: Every day | ORAL | 5 refills | Status: DC

## 2020-01-23 NOTE — Progress Notes (Signed)
Subjective:    Patient ID: Alfred Foster, male    DOB: [birth year], 74 y.o.   MRN: [id number]  HPI The patient is here for follow up of their chronic medical problems, including hypertension.   Two weeks ago we added losartan for his BP.  He is taking all of his medications as prescribed.     BP at home 142/90, 151/92, 147/93   Medications and allergies reviewed with patient and updated if appropriate.  Patient Active Problem List   Diagnosis Date Noted  . Side pain 09/06/2019  . 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 Carotid artery disease ([place]) 10/21/2017  . Right carotid bruit 09/15/2017  . Left shoulder pain 02/17/2016  . Lipoma of skin of abdomen 03/19/2015  . Nonspecific abnormal electrocardiogram (ECG) (EKG) 03/15/2012  . Essential hypertension 10/22/2010  . PROSTATE CANCER, HX OF 01/23/2009  . Diabetes mellitus ([place]) 08/09/2007  . Hyperlipidemia 05/08/2007  . Acute hypoxemic respiratory failure due to COVID-19 [facility]) 2007    Current Outpatient Medications on File Prior to Visit  Medication Sig Dispense Refill  . amLODipine (NORVASC) 10 MG tablet Take 1 tablet (10 mg total) by mouth daily. 90 tablet 3  . apixaban (ELIQUIS) 5 MG TABS tablet Take 1 tablet (5 mg total) by mouth 2 (two) times daily. 60 tablet 3  . Ascorbic Acid (VITAMIN C PO) Take 1 tablet by mouth daily.     Marland Kitchen aspirin 81 MG tablet Take 81 mg by mouth daily.    . Cholecalciferol (VITAMIN D3) 1000 UNITS CAPS Take 1,000 Units by mouth daily.    . dapagliflozin propanediol (FARXIGA) 5 MG TABS tablet Take 5 mg by mouth daily before breakfast. 30 tablet 5  . 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  Dx: I82.461 2 each 0  . feeding supplement, ENSURE ENLIVE, (ENSURE ENLIVE) LIQD Take 237 mLs by mouth 2 (two) times daily between meals. 123XX123 mL 12  . folic acid (FOLVITE) Q000111Q MCG tablet Take 800 mcg by mouth daily.     .  Ipratropium-Albuterol (COMBIVENT) 20-100 MCG/ACT AERS respimat Inhale 1 puff into the lungs every 6 (six) hours as needed for wheezing or shortness of breath. 4 g 0  . Lancets (ONETOUCH ULTRASOFT) lancets use as directed 100 each 1  . metoprolol succinate (TOPROL-XL) 50 MG 24 hr tablet Take 1 tablet (50 mg total) by mouth daily. Take with or immediately following a meal. 30 tablet 0  . niacin 500 MG tablet Take 500 mg by mouth daily.     . Omega-3 Fatty Acids (FISH OIL PO) Take 1,000 mg by mouth daily.     . ONE TOUCH ULTRA TEST test strip use as directed 100 each 1  . rosuvastatin (CRESTOR) 10 MG tablet TAKE 1 TABLET(10 MG) BY MOUTH DAILY (Patient taking differently: Take 10 mg by mouth daily. ) 90 tablet 1  . sitaGLIPtin (JANUVIA) 100 MG tablet Take 1 tablet (100 mg total) by mouth daily. 30 tablet 5  . zinc sulfate 220 (50 Zn) MG capsule Take 1 capsule (220 mg total) by mouth daily. 30 capsule 0   No current facility-administered medications on file prior to visit.    Past Medical History:  Diagnosis Date  . Diabetes mellitus   . Hyperlipidemia   . Hypertension   . Prostate cancer [facility]) 2007   Dr Alinda Money    Past Surgical History:  Procedure Laterality Date  .  COLONOSCOPY  2004 & 2014   Negative ;[facility]  . INGUINAL HERNIA REPAIR    . PROSTATECTOMY  04/2006   Dr Alinda Money  . UMBILICAL HERNIA REPAIR      Social History   Socioeconomic History  . Marital status: Married    Spouse name: Not on file  . Number of children: Not on file  . Years of education: Not on file  . Highest education level: Not on file  Occupational History  . Not on file  Tobacco Use  . Smoking status: Never Smoker  . Smokeless tobacco: Never Used  Substance and Sexual Activity  . Alcohol use: No  . Drug use: No  . Sexual activity: Not on file  Other Topics Concern  . Not on file  Social History Narrative  . Not on file   Social Determinants of Health   Financial Resource Strain:   .  Difficulty of Paying Living Expenses: Not on file  Food Insecurity:   . Worried About Charity fundraiser in the Last Year: Not on file  . Ran Out of Food in the Last Year: Not on file  Transportation Needs:   . Lack of Transportation (Medical): Not on file  . Lack of Transportation (Non-Medical): Not on file  Physical Activity:   . Days of Exercise per Week: Not on file  . Minutes of Exercise per Session: Not on file  Stress:   . Feeling of Stress : Not on file  Social Connections:   . Frequency of Communication with Friends and Family: Not on file  . Frequency of Social Gatherings with Friends and Family: Not on file  . Attends Religious Services: Not on file  . Active Member of Clubs or Organizations: Not on file  . Attends Archivist Meetings: Not on file  . Marital Status: Not on file    Family History  Problem Relation Age of Onset  . Diabetes Father   . Hypertension Father   . Alzheimer's disease Father   . Hypertension Mother   . Alzheimer's disease Mother   . Heart attack Mother 69  . Diabetes Brother   . Heart disease Brother   . Heart attack Maternal Aunt        in 69s  . Stroke Neg Hx     Review of Systems  Constitutional: Negative for chills and fever.  Respiratory: Negative for cough, shortness of breath and wheezing.   Cardiovascular: Negative for chest pain, palpitations and leg swelling.  Neurological: Negative for dizziness, light-headedness and headaches.       Objective:   Vitals:   01/24/20 0900  BP: (!) 158/96  Pulse: 78  Resp: 16  Temp: 97.8 F (36.6 C)  SpO2: 99%   BP Readings from Last 3 Encounters:  01/24/20 (!) 158/96  01/10/20 (!) 148/82  12/14/19 (!) 154/90   Wt Readings from Last 3 Encounters:  01/24/20 215 lb (97.5 kg)  01/10/20 213 lb 9.6 oz (96.9 kg)  12/10/19 202 lb 13.2 oz (92 kg)   Body mass index is 28.37 kg/m.   Physical Exam    Constitutional: Appears well-developed and well-nourished. No distress.    HENT:  Head: Normocephalic and atraumatic.  Neck: Neck supple. No tracheal deviation present. No thyromegaly present.  No cervical lymphadenopathy Cardiovascular: Normal rate, regular rhythm and normal heart sounds.   No murmur heard. No carotid bruit .  No edema Pulmonary/Chest: Effort normal and breath sounds normal. No respiratory distress. No has  no wheezes. No rales.  Skin: Skin is warm and dry. Not diaphoretic.  Psychiatric: Normal mood and affect. Behavior is normal.      Assessment & Plan:    See Problem List for Assessment and Plan of chronic medical problems.    This visit occurred during the SARS-CoV-2 public health emergency.  Safety protocols were in place, including screening questions prior to the visit, additional usage of staff PPE, and extensive cleaning of exam room while observing appropriate contact time as indicated for disinfecting solutions.

## 2020-01-24 ENCOUNTER — Ambulatory Visit (INDEPENDENT_AMBULATORY_CARE_PROVIDER_SITE_OTHER): Payer: Medicare Other | Admitting: Internal Medicine

## 2020-01-24 ENCOUNTER — Other Ambulatory Visit: Payer: Self-pay

## 2020-01-24 ENCOUNTER — Encounter: Payer: Self-pay | Admitting: Internal Medicine

## 2020-01-24 VITALS — BP 158/96 | HR 78 | Temp 97.8°F | Resp 16 | Ht 73.0 in | Wt 215.0 lb

## 2020-01-24 DIAGNOSIS — I10 Essential (primary) hypertension: Secondary | ICD-10-CM | POA: Diagnosis not present

## 2020-01-24 MED ORDER — LOSARTAN POTASSIUM 100 MG PO TABS: 100.0000 mg | ORAL_TABLET | Freq: Every day | ORAL | 3 refills | Status: DC

## 2020-01-24 NOTE — Assessment & Plan Note (Signed)
Chronic Uncontrolled Increase losartan to 100 mg daily Continue current dose of amlodipine and metoprolol Encouraged continuing low-sodium diet, regular exercise and keeping his weight down We will check blood work at his next visit Advised him to continue to monitor his blood pressure at home and to bring his log and his blood pressure cuff at his next visit Follow-up in 1 month

## 2020-01-24 NOTE — Patient Instructions (Addendum)
  Medications reviewed and updated.  Changes include :   Increase losartan 100 mg a day  Your prescription(s) have been submitted to your pharmacy. Please take as directed and contact our office if you believe you are having problem(s) with the medication(s).    Please followup in 1 month

## 2020-02-07 ENCOUNTER — Ambulatory Visit: Payer: Medicare Other | Admitting: Podiatry

## 2020-02-07 ENCOUNTER — Encounter: Payer: Self-pay | Admitting: Podiatry

## 2020-02-07 ENCOUNTER — Other Ambulatory Visit: Payer: Self-pay

## 2020-02-07 DIAGNOSIS — M79675 Pain in left toe(s): Secondary | ICD-10-CM | POA: Diagnosis not present

## 2020-02-07 DIAGNOSIS — B351 Tinea unguium: Secondary | ICD-10-CM | POA: Diagnosis not present

## 2020-02-07 DIAGNOSIS — E119 Type 2 diabetes mellitus without complications: Secondary | ICD-10-CM

## 2020-02-07 DIAGNOSIS — M79674 Pain in right toe(s): Secondary | ICD-10-CM

## 2020-02-07 NOTE — Patient Instructions (Signed)
Diabetes Mellitus and Foot Care Foot care is an important part of your health, especially when you have diabetes. Diabetes may cause you to have problems because of poor blood flow (circulation) to your feet and legs, which can cause your skin to:  Become thinner and drier.  Break more easily.  Heal more slowly.  Peel and crack. You may also have nerve damage (neuropathy) in your legs and feet, causing decreased feeling in them. This means that you may not notice minor injuries to your feet that could lead to more serious problems. Noticing and addressing any potential problems early is the best way to prevent future foot problems. How to care for your feet Foot hygiene  Wash your feet daily with warm water and mild soap. Do not use hot water. Then, pat your feet and the areas between your toes until they are completely dry. Do not soak your feet as this can dry your skin.  Trim your toenails straight across. Do not dig under them or around the cuticle. File the edges of your nails with an emery board or nail file.  Apply a moisturizing lotion or petroleum jelly to the skin on your feet and to dry, brittle toenails. Use lotion that does not contain alcohol and is unscented. Do not apply lotion between your toes. Shoes and socks  Wear clean socks or stockings every day. Make sure they are not too tight. Do not wear knee-high stockings since they may decrease blood flow to your legs.  Wear shoes that fit properly and have enough cushioning. Always look in your shoes before you put them on to be sure there are no objects inside.  To break in new shoes, wear them for just a few hours a day. This prevents injuries on your feet. Wounds, scrapes, corns, and calluses  Check your feet daily for blisters, cuts, bruises, sores, and redness. If you cannot see the bottom of your feet, use a mirror or ask someone for help.  Do not cut corns or calluses or try to remove them with medicine.  If you  find a minor scrape, cut, or break in the skin on your feet, keep it and the skin around it clean and dry. You may clean these areas with mild soap and water. Do not clean the area with peroxide, alcohol, or iodine.  If you have a wound, scrape, corn, or callus on your foot, look at it several times a day to make sure it is healing and not infected. Check for: ? Redness, swelling, or pain. ? Fluid or blood. ? Warmth. ? Pus or a bad smell. General instructions  Do not cross your legs. This may decrease blood flow to your feet.  Do not use heating pads or hot water bottles on your feet. They may burn your skin. If you have lost feeling in your feet or legs, you may not know this is happening until it is too late.  Protect your feet from hot and cold by wearing shoes, such as at the beach or on hot pavement.  Schedule a complete foot exam at least once a year (annually) or more often if you have foot problems. If you have foot problems, report any cuts, sores, or bruises to your health care provider immediately. Contact a health care provider if:  You have a medical condition that increases your risk of infection and you have any cuts, sores, or bruises on your feet.  You have an injury that is not   healing.  You have redness on your legs or feet.  You feel burning or tingling in your legs or feet.  You have pain or cramps in your legs and feet.  Your legs or feet are numb.  Your feet always feel cold.  You have pain around a toenail. Get help right away if:  You have a wound, scrape, corn, or callus on your foot and: ? You have pain, swelling, or redness that gets worse. ? You have fluid or blood coming from the wound, scrape, corn, or callus. ? Your wound, scrape, corn, or callus feels warm to the touch. ? You have pus or a bad smell coming from the wound, scrape, corn, or callus. ? You have a fever. ? You have a red line going up your leg. Summary  Check your feet every day  for cuts, sores, red spots, swelling, and blisters.  Moisturize feet and legs daily.  Wear shoes that fit properly and have enough cushioning.  If you have foot problems, report any cuts, sores, or bruises to your health care provider immediately.  Schedule a complete foot exam at least once a year (annually) or more often if you have foot problems. This information is not intended to replace advice given to you by your health care provider. Make sure you discuss any questions you have with your health care provider. Document Revised: 08/21/2019 Document Reviewed: 12/30/2016 Elsevier Patient Education  2020 [employer] Inc[employer]  

## 2020-02-09 NOTE — Progress Notes (Signed)
Subjective: Alfred Foster presents today for follow up of preventative diabetic foot care and painful mycotic nails b/l that are difficult to trim. Pain interferes with ambulation. Aggravating factors include wearing enclosed shoe gear. Pain is relieved with periodic professional debridement.   He voices no new pedal problems on today's visit.   He states his wife is in another rehab facility and was unable to make her appointment on today. He will reschedule her appointment for a later date.  Allergies  Allergen Reactions  . Hydrochlorothiazide     SOB, dizzy  . Metformin And Related Other (See Comments)    Make him feel full and uncomfortable     Objective: There were no vitals filed for this visit.  Vascular Examination:  Capillary refill time to digits immediate b/l, palpable DP pulses b/l, palpable PT pulses b/l, pedal hair absent b/l and skin temperature gradient within normal limits b/l  Dermatological Examination: Pedal skin with normal turgor, texture and tone bilaterally and toenails 1-5 b/l elongated, dystrophic, thickened, crumbly with subungual debris  Musculoskeletal: Normal muscle strength 5/5 to all lower extremity muscle groups bilaterally, no pain crepitus or joint limitation noted with ROM b/l, bunion deformity noted b/l and pes planus deformity noted  Neurological: Protective sensation intact 5/5 intact bilaterally with 10g monofilament b/l and vibratory sensation intact b/l  Assessment: 1. Pain due to onychomycosis of toenails of both feet   2. Controlled type 2 diabetes mellitus without complication, without long-term current use of insulin ([place])    Plan: -Continue diabetic foot care principles. Literature dispensed on today.  -Toenails 1-5 b/l were debrided in length and girth with sterile nail nippers and dremel without iatrogenic bleeding. -Patient to continue soft, supportive shoe gear daily. -Patient/POA to call should there be question/concern in the  interim.  Return in about 9 weeks (around 04/10/2020) for diabetic nail and callus trim.

## 2020-02-19 NOTE — Patient Outreach (Signed)
Addendum, late entry. On above note "disease management" should be "complex care management."  Channon Ambrosini C. [name], MSN, [facility] Gerontological Nurse Practitioner [facility] Care Management [phone]

## 2020-02-19 NOTE — Patient Outreach (Signed)
Late entry for 01/06/20 - pt status changed from complex, 3 case management to 2 disease management.   [name]. [name], MSN, [facility] Gerontological Nurse Practitioner [facility] Care Management [phone]

## 2020-02-27 NOTE — Patient Instructions (Addendum)
Medications reviewed and updated.  Changes include :   Decrease losartan to 50 mg daily.  Start spironolactone.    Your prescription(s) have been submitted to your pharmacy. Please take as directed and contact our office if you believe you are having problem(s) with the medication(s).     Please followup in 3 weeks      Sciatica Rehab Ask your health care provider which exercises are safe for you. Do exercises exactly as told by your health care provider and adjust them as directed. It is normal to feel mild stretching, pulling, tightness, or discomfort as you do these exercises. Stop right away if you feel sudden pain or your pain gets worse. Do not begin these exercises until told by your health care provider. Stretching and range-of-motion exercises These exercises warm up your muscles and joints and improve the movement and flexibility of your hips and back. These exercises also help to relieve pain, numbness, and tingling. Sciatic nerve glide 1. Sit in a chair with your head facing down toward your chest. Place your hands behind your back. Let your shoulders slump forward. 2. Slowly straighten one of your legs while you tilt your head back as if you are looking toward the ceiling. Only straighten your leg as far as you can without making your symptoms worse. 3. Hold this position for __________ seconds. 4. Slowly return to the starting position. 5. Repeat with your other leg. Repeat __________ times. Complete this exercise __________ times a day. Knee to chest with hip adduction and internal rotation  1. Lie on your back on a firm surface with both legs straight. 2. Bend one of your knees and move it up toward your chest until you feel a gentle stretch in your lower back and buttock. Then, move your knee toward the shoulder that is on the opposite side from your leg. This is hip adduction and internal rotation. ? Hold your leg in this position by holding on to the front of  your knee. 3. Hold this position for __________ seconds. 4. Slowly return to the starting position. 5. Repeat with your other leg. Repeat __________ times. Complete this exercise __________ times a day. Prone extension on elbows  1. Lie on your abdomen on a firm surface. A bed may be too soft for this exercise. 2. Prop yourself up on your elbows. 3. Use your arms to help lift your chest up until you feel a gentle stretch in your abdomen and your lower back. ? This will place some of your body weight on your elbows. If this is uncomfortable, try stacking pillows under your chest. ? Your hips should stay down, against the surface that you are lying on. Keep your hip and back muscles relaxed. 4. Hold this position for __________ seconds. 5. Slowly relax your upper body and return to the starting position. Repeat __________ times. Complete this exercise __________ times a day. Strengthening exercises These exercises build strength and endurance in your back. Endurance is the ability to use your muscles for a long time, even after they get tired. Pelvic tilt This exercise strengthens the muscles that lie deep in the abdomen. 1. Lie on your back on a firm surface. Bend your knees and keep your feet flat on the floor. 2. Tense your abdominal muscles. Tip your pelvis up toward the ceiling and flatten your lower back into the floor. ? To help with this exercise, you may place a small towel under your lower back and try  to push your back into the towel. 3. Hold this position for __________ seconds. 4. Let your muscles relax completely before you repeat this exercise. Repeat __________ times. Complete this exercise __________ times a day. Alternating arm and leg raises  1. Get on your hands and knees on a firm surface. If you are on a hard floor, you may want to use padding, such as an exercise mat, to cushion your knees. 2. Line up your arms and legs. Your hands should be directly below your  shoulders, and your knees should be directly below your hips. 3. Lift your left leg behind you. At the same time, raise your right arm and straighten it in front of you. ? Do not lift your leg higher than your hip. ? Do not lift your arm higher than your shoulder. ? Keep your abdominal and back muscles tight. ? Keep your hips facing the ground. ? Do not arch your back. ? Keep your balance carefully, and do not hold your breath. 4. Hold this position for __________ seconds. 5. Slowly return to the starting position. 6. Repeat with your right leg and your left arm. Repeat __________ times. Complete this exercise __________ times a day. Posture and body mechanics Good posture and healthy body mechanics can help to relieve stress in your body's tissues and joints. Body mechanics refers to the movements and positions of your body while you do your daily activities. Posture is part of body mechanics. Good posture means:  Your spine is in its natural S-curve position (neutral).  Your shoulders are pulled back slightly.  Your head is not tipped forward. Follow these guidelines to improve your posture and body mechanics in your everyday activities. Standing   When standing, keep your spine neutral and your feet about hip width apart. Keep a slight bend in your knees. Your ears, shoulders, and hips should line up.  When you do a task in which you stand in one place for a long time, place one foot up on a stable object that is 2-4 inches (5-10 cm) high, such as a footstool. This helps keep your spine neutral. Sitting   When sitting, keep your spine neutral and keep your feet flat on the floor. Use a footrest, if necessary, and keep your thighs parallel to the floor. Avoid rounding your shoulders, and avoid tilting your head forward.  When working at a desk or a computer, keep your desk at a height where your hands are slightly lower than your elbows. Slide your chair under your desk so you are  close enough to maintain good posture.  When working at a computer, place your monitor at a height where you are looking straight ahead and you do not have to tilt your head forward or downward to look at the screen. Resting  When lying down and resting, avoid positions that are most painful for you.  If you have pain with activities such as sitting, bending, stooping, or squatting, lie in a position in which your body does not bend very much. For example, avoid curling up on your side with your arms and knees near your chest (fetal position).  If you have pain with activities such as standing for a long time or reaching with your arms, lie with your spine in a neutral position and bend your knees slightly. Try the following positions: ? Lying on your side with a pillow between your knees. ? Lying on your back with a pillow under your knees. Lifting  When lifting objects, keep your feet at least shoulder width apart and tighten your abdominal muscles.  Bend your knees and hips and keep your spine neutral. It is important to lift using the strength of your legs, not your back. Do not lock your knees straight out.  Always ask for help to lift heavy or awkward objects. This information is not intended to replace advice given to you by your health care provider. Make sure you discuss any questions you have with your health care provider. Document Revised: 03/22/2019 Document Reviewed: 12/20/2018 Elsevier Patient Education  [place].

## 2020-02-27 NOTE — Progress Notes (Signed)
Subjective:    Patient ID: Alfred Foster, male    DOB: [birth year], 74 y.o.   MRN: [id number]  HPI The patient is here for follow up of his hypertension.  One month ago we increased his losartan to 100 mg.     BP at home 160/98, 186/119, 177/99, 167/90, 174/93.  He has not seen any improvement with the increased dose of medication.  He is taking all of his other medications as prescribed.  He feels he is being compliant with a low-sodium diet.  1 week ago he started to experience pain in his right buttock that goes down into his leg.  He denies any numbness or tingling.  He has not had this in the past.  He does do some heavy lifting at work and thinks that is most likely the cause.  He denies back pain.  He has not taken anything for the pain.   Medications and allergies reviewed with patient and updated if appropriate.  Patient Active Problem List   Diagnosis Date Noted  . Lumbar radiculopathy, acute 02/28/2020  . Side pain 09/06/2019  . 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 Carotid artery disease ([place]) 10/21/2017  . Right carotid bruit 09/15/2017  . Left shoulder pain 02/17/2016  . Lipoma of skin of abdomen 03/19/2015  . Nonspecific abnormal electrocardiogram (ECG) (EKG) 03/15/2012  . Essential hypertension 10/22/2010  . PROSTATE CANCER, HX OF 01/23/2009  . Diabetes mellitus ([place]) 08/09/2007  . Hyperlipidemia 05/08/2007  . Acute hypoxemic respiratory failure due to COVID-19 [facility]) 2007    Current Outpatient Medications on File Prior to Visit  Medication Sig Dispense Refill  . amLODipine (NORVASC) 10 MG tablet Take 1 tablet (10 mg total) by mouth daily. 90 tablet 3  . apixaban (ELIQUIS) 5 MG TABS tablet Take 1 tablet (5 mg total) by mouth 2 (two) times daily. 60 tablet 3  . Ascorbic Acid (VITAMIN C PO) Take 1 tablet by mouth daily.     Marland Kitchen aspirin 81 MG tablet Take 81 mg by mouth daily.    . Cholecalciferol (VITAMIN D3) 1000 UNITS CAPS Take 1,000  Units by mouth daily.    . dapagliflozin propanediol (FARXIGA) 5 MG TABS tablet Take 5 mg by mouth daily before breakfast. 30 tablet 5  . 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  Dx: I82.461 2 each 0  . feeding supplement, ENSURE ENLIVE, (ENSURE ENLIVE) LIQD Take 237 mLs by mouth 2 (two) times daily between meals. 123XX123 mL 12  . folic acid (FOLVITE) Q000111Q MCG tablet Take 800 mcg by mouth daily.     . Ipratropium-Albuterol (COMBIVENT) 20-100 MCG/ACT AERS respimat Inhale 1 puff into the lungs every 6 (six) hours as needed for wheezing or shortness of breath. 4 g 0  . Lancets (ONETOUCH ULTRASOFT) lancets use as directed 100 each 1  . metoprolol succinate (TOPROL-XL) 50 MG 24 hr tablet Take 1 tablet (50 mg total) by mouth daily. Take with or immediately following a meal. 30 tablet 0  . niacin 500 MG tablet Take 500 mg by mouth daily.     . Omega-3 Fatty Acids (FISH OIL PO) Take 1,000 mg by mouth daily.     . ONE TOUCH ULTRA TEST test strip use as directed 100 each 1  . rosuvastatin (CRESTOR) 10 MG tablet TAKE 1 TABLET(10 MG) BY MOUTH DAILY (Patient taking differently: Take 10 mg by mouth daily. ) 90  tablet 1  . sitaGLIPtin (JANUVIA) 100 MG tablet Take 1 tablet (100 mg total) by mouth daily. 30 tablet 5  . zinc sulfate 220 (50 Zn) MG capsule Take 1 capsule (220 mg total) by mouth daily. 30 capsule 0   No current facility-administered medications on file prior to visit.    Past Medical History:  Diagnosis Date  . Diabetes mellitus   . Hyperlipidemia   . Hypertension   . Prostate cancer [facility]) 2007   Dr Alinda Money    Past Surgical History:  Procedure Laterality Date  . COLONOSCOPY  2004 & 2014   Negative ;[facility]  . INGUINAL HERNIA REPAIR    . PROSTATECTOMY  04/2006   Dr Alinda Money  . UMBILICAL HERNIA REPAIR      Social History   Socioeconomic History  . Marital status: Married    Spouse name: Not on file  . Number of children: Not on  file  . Years of education: Not on file  . Highest education level: Not on file  Occupational History  . Not on file  Tobacco Use  . Smoking status: Never Smoker  . Smokeless tobacco: Never Used  Substance and Sexual Activity  . Alcohol use: No  . Drug use: No  . Sexual activity: Not on file  Other Topics Concern  . Not on file  Social History Narrative  . Not on file   Social Determinants of Health   Financial Resource Strain:   . Difficulty of Paying Living Expenses:   Food Insecurity:   . Worried About Charity fundraiser in the Last Year:   . Arboriculturist in the Last Year:   Transportation Needs:   . Film/video editor (Medical):   Marland Kitchen Lack of Transportation (Non-Medical):   Physical Activity:   . Days of Exercise per Week:   . Minutes of Exercise per Session:   Stress:   . Feeling of Stress :   Social Connections:   . Frequency of Communication with Friends and Family:   . Frequency of Social Gatherings with Friends and Family:   . Attends Religious Services:   . Active Member of Clubs or Organizations:   . Attends Archivist Meetings:   Marland Kitchen Marital Status:     Family History  Problem Relation Age of Onset  . Diabetes Father   . Hypertension Father   . Alzheimer's disease Father   . Hypertension Mother   . Alzheimer's disease Mother   . Heart attack Mother 22  . Diabetes Brother   . Heart disease Brother   . Heart attack Maternal Aunt        in 68s  . Stroke Neg Hx     Review of Systems  Constitutional: Negative for fever.  Respiratory: Negative for shortness of breath.   Cardiovascular: Negative for chest pain, palpitations and leg swelling.  Musculoskeletal: Negative for back pain.       Right leg pain  Neurological: Negative for light-headedness, numbness and headaches.       Objective:   Vitals:   02/28/20 0859  BP: (!) 156/88  Pulse: 74  Temp: 98.1 F (36.7 C)  SpO2: 99%   BP Readings from Last 3 Encounters:  02/28/20  (!) 156/88  01/24/20 (!) 158/96  01/10/20 (!) 148/82   Wt Readings from Last 3 Encounters:  02/28/20 216 lb (98 kg)  01/24/20 215 lb (97.5 kg)  01/10/20 213 lb 9.6 oz (96.9 kg)   Body mass  index is 28.5 kg/m.   Physical Exam    Constitutional: Appears well-developed and well-nourished. No distress.  HENT:  Head: Normocephalic and atraumatic.  Neck: Neck supple. No tracheal deviation present. No thyromegaly present.  No cervical lymphadenopathy Cardiovascular: Normal rate, regular rhythm and normal heart sounds.   No murmur heard. No carotid bruit .  No edema Pulmonary/Chest: Effort normal and breath sounds normal. No respiratory distress. No has no wheezes. No rales. MSK: No lower back tenderness with palpation.  Normal sensation bilateral lower extremities.  Gait normal. Skin: Skin is warm and dry. Not diaphoretic.  Psychiatric: Normal mood and affect. Behavior is normal.      Assessment & Plan:    See Problem List for Assessment and Plan of chronic medical problems.    This visit occurred during the SARS-CoV-2 public health emergency.  Safety protocols were in place, including screening questions prior to the visit, additional usage of staff PPE, and extensive cleaning of exam room while observing appropriate contact time as indicated for disinfecting solutions.

## 2020-02-28 ENCOUNTER — Encounter: Payer: Self-pay | Admitting: Internal Medicine

## 2020-02-28 ENCOUNTER — Ambulatory Visit (INDEPENDENT_AMBULATORY_CARE_PROVIDER_SITE_OTHER): Payer: Medicare Other | Admitting: Internal Medicine

## 2020-02-28 ENCOUNTER — Other Ambulatory Visit: Payer: Self-pay

## 2020-02-28 VITALS — BP 156/88 | HR 74 | Temp 98.1°F | Ht 73.0 in | Wt 216.0 lb

## 2020-02-28 DIAGNOSIS — M5416 Radiculopathy, lumbar region: Secondary | ICD-10-CM | POA: Diagnosis not present

## 2020-02-28 DIAGNOSIS — M48062 Spinal stenosis, lumbar region with neurogenic claudication: Secondary | ICD-10-CM | POA: Insufficient documentation

## 2020-02-28 DIAGNOSIS — I10 Essential (primary) hypertension: Secondary | ICD-10-CM | POA: Diagnosis not present

## 2020-02-28 MED ORDER — LOSARTAN POTASSIUM 50 MG PO TABS: 50.0000 mg | ORAL_TABLET | Freq: Every day | ORAL | 3 refills | Status: DC

## 2020-02-28 MED ORDER — SPIRONOLACTONE 25 MG PO TABS: 25.0000 mg | ORAL_TABLET | Freq: Every day | ORAL | 5 refills | Status: DC

## 2020-02-28 NOTE — Assessment & Plan Note (Addendum)
Acute- started 1 week-likely related to heavy lifting at work Pain is tolerable he does not think he needs anything for the pain at this time Avoid bending, lifting and twisting Deferred referral to sports medicine or physical therapy at this time Given back exercises to start doing at home Advised him to call if there is no improvement for any worsening of the symptoms so that we can refer for further evaluation and treatment

## 2020-02-28 NOTE — Assessment & Plan Note (Signed)
Chronic Not controlled No improvement in the BP with increasing dose of losartan so we will decrease back to 50 mg daily Start spironolactone 25 mg daily Continue amlodipine, metoprolol Monitor BP at home Follow-up in 3 weeks-we will need BMP at that time

## 2020-03-13 ENCOUNTER — Ambulatory Visit: Payer: Medicare Other | Attending: Internal Medicine

## 2020-03-13 DIAGNOSIS — Z23 Encounter for immunization: Secondary | ICD-10-CM

## 2020-03-13 NOTE — Progress Notes (Signed)
   Covid-19 Vaccination Clinic  Name:  ATREYA TERHAAR    MRN: [id number] DOB: 03-Aug-1946  03/13/2020  Alfred Foster was observed post Covid-19 immunization for 15 minutes without incident. He was provided with Vaccine Information Sheet and instruction to access the V-Safe system.   Mr. Kee was instructed to call 911 with any severe reactions post vaccine: Marland Kitchen Difficulty breathing  . Swelling of face and throat  . A fast heartbeat  . A bad rash all over body  . Dizziness and weakness   Immunizations Administered    Name Date Dose VIS Date Route   Pfizer COVID-19 Vaccine 03/13/2020  8:10 AM 0.3 mL 11/22/2019 Intramuscular   Manufacturer: [employer]   Lot: [id number]   [place]: [id number]

## 2020-03-19 NOTE — Progress Notes (Signed)
Subjective:    Patient ID: Alfred Foster, male    DOB: 1946/10/11, 74 y.o.   MRN: [id number]  HPI The patient is here for follow up of their chronic medical problems, including hypertension, diabetes, hyperlipidemia  He is taking all of his medications as prescribed.    He is active, but not exercising regularly.      He is still has right leg pain.  He has to shift positions to help the pain when sitting.  When he first gets up he walks with a limp, but this gets better.   Medications and allergies reviewed with patient and updated if appropriate.  Patient Active Problem List   Diagnosis Date Noted  . Lumbar radiculopathy, acute 02/28/2020  . Side pain 09/06/2019  . 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 Carotid artery disease ([place]) 10/21/2017  . Right carotid bruit 09/15/2017  . Left shoulder pain 02/17/2016  . Lipoma of skin of abdomen 03/19/2015  . Nonspecific abnormal electrocardiogram (ECG) (EKG) 03/15/2012  . Essential hypertension 10/22/2010  . PROSTATE CANCER, HX OF 01/23/2009  . Diabetes mellitus ([place]) 08/09/2007  . Hyperlipidemia 05/08/2007  . Acute hypoxemic respiratory failure due to COVID-19 [facility]) 2007    Current Outpatient Medications on File Prior to Visit  Medication Sig Dispense Refill  . amLODipine (NORVASC) 10 MG tablet Take 1 tablet (10 mg total) by mouth daily. 90 tablet 3  . apixaban (ELIQUIS) 5 MG TABS tablet Take 1 tablet (5 mg total) by mouth 2 (two) times daily. 60 tablet 3  . Ascorbic Acid (VITAMIN C PO) Take 1 tablet by mouth daily.     Marland Kitchen aspirin 81 MG tablet Take 81 mg by mouth daily.    . Cholecalciferol (VITAMIN D3) 1000 UNITS CAPS Take 1,000 Units by mouth daily.    . dapagliflozin propanediol (FARXIGA) 5 MG TABS tablet Take 5 mg by mouth daily before breakfast. 30 tablet 5  . 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  Dx: I82.461 2 each 0  . feeding  supplement, ENSURE ENLIVE, (ENSURE ENLIVE) LIQD Take 237 mLs by mouth 2 (two) times daily between meals. 123XX123 mL 12  . folic acid (FOLVITE) Q000111Q MCG tablet Take 800 mcg by mouth daily.     . Ipratropium-Albuterol (COMBIVENT) 20-100 MCG/ACT AERS respimat Inhale 1 puff into the lungs every 6 (six) hours as needed for wheezing or shortness of breath. 4 g 0  . Lancets (ONETOUCH ULTRASOFT) lancets use as directed 100 each 1  . losartan (COZAAR) 50 MG tablet Take 1 tablet (50 mg total) by mouth daily. (Patient taking differently: Take 25 mg by mouth daily. ) 90 tablet 3  . metoprolol succinate (TOPROL-XL) 50 MG 24 hr tablet Take 1 tablet (50 mg total) by mouth daily. Take with or immediately following a meal. 30 tablet 0  . niacin 500 MG tablet Take 500 mg by mouth daily.     . Omega-3 Fatty Acids (FISH OIL PO) Take 1,000 mg by mouth daily.     . ONE TOUCH ULTRA TEST test strip use as directed 100 each 1  . rosuvastatin (CRESTOR) 10 MG tablet TAKE 1 TABLET(10 MG) BY MOUTH DAILY (Patient taking differently: Take 10 mg by mouth daily. ) 90 tablet 1  . sitaGLIPtin (JANUVIA) 100 MG tablet Take 1 tablet (100 mg total) by mouth daily. 30 tablet 5  . spironolactone (ALDACTONE) 25 MG tablet Take 1  tablet (25 mg total) by mouth daily. 30 tablet 5  . zinc sulfate 220 (50 Zn) MG capsule Take 1 capsule (220 mg total) by mouth daily. 30 capsule 0   No current facility-administered medications on file prior to visit.    Past Medical History:  Diagnosis Date  . Diabetes mellitus   . Hyperlipidemia   . Hypertension   . Prostate cancer [facility]) 2007   Dr Alinda Money    Past Surgical History:  Procedure Laterality Date  . COLONOSCOPY  2004 & 2014   Negative ;[facility]  . INGUINAL HERNIA REPAIR    . PROSTATECTOMY  04/2006   Dr Alinda Money  . UMBILICAL HERNIA REPAIR      Social History   Socioeconomic History  . Marital status: Married    Spouse name: Not on file  . Number of children: Not on file  . Years of  education: Not on file  . Highest education level: Not on file  Occupational History  . Not on file  Tobacco Use  . Smoking status: Never Smoker  . Smokeless tobacco: Never Used  Substance and Sexual Activity  . Alcohol use: No  . Drug use: No  . Sexual activity: Not on file  Other Topics Concern  . Not on file  Social History Narrative  . Not on file   Social Determinants of Health   Financial Resource Strain:   . Difficulty of Paying Living Expenses:   Food Insecurity:   . Worried About Charity fundraiser in the Last Year:   . Arboriculturist in the Last Year:   Transportation Needs:   . Film/video editor (Medical):   Marland Kitchen Lack of Transportation (Non-Medical):   Physical Activity:   . Days of Exercise per Week:   . Minutes of Exercise per Session:   Stress:   . Feeling of Stress :   Social Connections:   . Frequency of Communication with Friends and Family:   . Frequency of Social Gatherings with Friends and Family:   . Attends Religious Services:   . Active Member of Clubs or Organizations:   . Attends Archivist Meetings:   Marland Kitchen Marital Status:     Family History  Problem Relation Age of Onset  . Diabetes Father   . Hypertension Father   . Alzheimer's disease Father   . Hypertension Mother   . Alzheimer's disease Mother   . Heart attack Mother 46  . Diabetes Brother   . Heart disease Brother   . Heart attack Maternal Aunt        in 16s  . Stroke Neg Hx     Review of Systems  Constitutional: Negative for chills and fever.  Respiratory: Positive for wheezing (rare). Negative for cough and shortness of breath.   Cardiovascular: Negative for chest pain, palpitations and leg swelling.  Neurological: Negative for light-headedness and headaches.       Objective:   Vitals:   03/20/20 0749  BP: 124/72  Pulse: 74  Temp: 98.2 F (36.8 C)  SpO2: 98%   BP Readings from Last 3 Encounters:  03/20/20 124/72  02/28/20 (!) 156/88  01/24/20 (!)  158/96   Wt Readings from Last 3 Encounters:  03/20/20 213 lb (96.6 kg)  02/28/20 216 lb (98 kg)  01/24/20 215 lb (97.5 kg)   Body mass index is 28.1 kg/m.   Physical Exam    Constitutional: Appears well-developed and well-nourished. No distress.  HENT:  Head: Normocephalic  and atraumatic.  Neck: Neck supple. No tracheal deviation present. No thyromegaly present.  No cervical lymphadenopathy Cardiovascular: Normal rate, regular rhythm and normal heart sounds.   No murmur heard. No carotid bruit .  No edema Pulmonary/Chest: Effort normal and breath sounds normal. No respiratory distress. No has no wheezes. No rales.  Skin: Skin is warm and dry. Not diaphoretic.  Psychiatric: Normal mood and affect. Behavior is normal.      Assessment & Plan:    See Problem List for Assessment and Plan of chronic medical problems.    This visit occurred during the SARS-CoV-2 public health emergency.  Safety protocols were in place, including screening questions prior to the visit, additional usage of staff PPE, and extensive cleaning of exam room while observing appropriate contact time as indicated for disinfecting solutions.

## 2020-03-19 NOTE — Patient Instructions (Addendum)
  Blood work was ordered.     Medications reviewed and updated.  Changes include :   None   Your prescription(s) have been submitted to your pharmacy. Please take as directed and contact our office if you believe you are having problem(s) with the medication(s).     Please followup in 6 months   

## 2020-03-20 ENCOUNTER — Encounter: Payer: Self-pay | Admitting: Internal Medicine

## 2020-03-20 ENCOUNTER — Other Ambulatory Visit: Payer: Self-pay

## 2020-03-20 ENCOUNTER — Ambulatory Visit (INDEPENDENT_AMBULATORY_CARE_PROVIDER_SITE_OTHER): Payer: Medicare Other | Admitting: Internal Medicine

## 2020-03-20 VITALS — BP 124/72 | HR 74 | Temp 98.2°F | Ht 73.0 in | Wt 213.0 lb

## 2020-03-20 DIAGNOSIS — E782 Mixed hyperlipidemia: Secondary | ICD-10-CM

## 2020-03-20 DIAGNOSIS — M5416 Radiculopathy, lumbar region: Secondary | ICD-10-CM

## 2020-03-20 DIAGNOSIS — I10 Essential (primary) hypertension: Secondary | ICD-10-CM | POA: Diagnosis not present

## 2020-03-20 DIAGNOSIS — E119 Type 2 diabetes mellitus without complications: Secondary | ICD-10-CM

## 2020-03-20 LAB — LIPID PANEL
Cholesterol: 117 mg/dL (ref 0–200)
HDL: 33.1 mg/dL — ABNORMAL LOW (ref >39.00)
LDL Cholesterol: 71 mg/dL (ref 0–99)
NonHDL: 84.15
Total CHOL/HDL Ratio: 4
Triglycerides: 66 mg/dL (ref 0.0–149.0)
VLDL: 13.2 mg/dL (ref 0.0–40.0)

## 2020-03-20 LAB — COMPREHENSIVE METABOLIC PANEL
ALT: 17 U/L (ref 0–53)
AST: 16 U/L (ref 0–37)
Albumin: 4.2 g/dL (ref 3.5–5.2)
Alkaline Phosphatase: 79 U/L (ref 39–117)
BUN: 28 mg/dL — ABNORMAL HIGH (ref 6–23)
CO2: 24 mEq/L (ref 19–32)
Calcium: 9.3 mg/dL (ref 8.4–10.5)
Chloride: 108 mEq/L (ref 96–112)
Creatinine, Ser: 1.59 mg/dL — ABNORMAL HIGH (ref 0.40–1.50)
GFR: 51.94 mL/min — ABNORMAL LOW (ref >60.00)
Glucose, Bld: 147 mg/dL — ABNORMAL HIGH (ref 70–99)
Potassium: 4.3 mEq/L (ref 3.5–5.1)
Sodium: 140 mEq/L (ref 135–145)
Total Bilirubin: 0.7 mg/dL (ref 0.2–1.2)
Total Protein: 6.9 g/dL (ref 6.0–8.3)

## 2020-03-20 LAB — HEMOGLOBIN A1C: Hgb A1c MFr Bld: 6.7 % — ABNORMAL HIGH (ref 4.6–6.5)

## 2020-03-20 MED ORDER — TETANUS-DIPHTH-ACELL PERTUSSIS 5-2.5-18.5 LF-MCG/0.5 IM SUSP: 0.5000 mL | Freq: Once | INTRAMUSCULAR | 0 refills | Status: AC

## 2020-03-20 NOTE — Assessment & Plan Note (Signed)
Chronic Continue januvia, farxiga - can increase farxiga if needed Check a1c Low sugar / carb diet Stressed regular exercise

## 2020-03-20 NOTE — Assessment & Plan Note (Signed)
Still having right leg pain possible radiculopathy Will refer to sports meds

## 2020-03-20 NOTE — Assessment & Plan Note (Signed)
Chronic Check lipid panel  Continue daily statin Regular exercise and healthy diet encouraged  

## 2020-03-20 NOTE — Assessment & Plan Note (Signed)
Chronic BP well controlled Current regimen effective and well tolerated Continue current medications at current doses cmp  

## 2020-03-26 ENCOUNTER — Other Ambulatory Visit: Payer: Self-pay

## 2020-03-26 ENCOUNTER — Ambulatory Visit: Payer: Medicare Other | Admitting: Orthotics

## 2020-04-03 ENCOUNTER — Telehealth: Payer: Self-pay | Admitting: Internal Medicine

## 2020-04-03 ENCOUNTER — Ambulatory Visit: Payer: Medicare Other | Admitting: Family Medicine

## 2020-04-03 NOTE — Telephone Encounter (Signed)
Called pt and let her know she will need to contact medical records for this information. Pt expressed understanding  

## 2020-04-03 NOTE — Telephone Encounter (Signed)
New message:   Pt's spouse is calling to request medical records. She states she needs 12/09/19-12/14/19 and 01/07/06. She would like 4 copies of each. She states to please mail them. Please advise.

## 2020-04-06 ENCOUNTER — Ambulatory Visit: Payer: Medicare Other | Attending: Internal Medicine

## 2020-04-06 ENCOUNTER — Other Ambulatory Visit: Payer: Self-pay | Admitting: *Deleted

## 2020-04-06 DIAGNOSIS — Z23 Encounter for immunization: Secondary | ICD-10-CM

## 2020-04-06 NOTE — Patient Outreach (Signed)
[facility]) Care Management  04/06/2020  Alfred Foster 09-24-1946 [id number]   Telephone outreach for disease management: Post COVID infection, DVT, DM.  Left message on voice mail to return my call.  Alfred Foster. Alfred Neither, MSN, [facility] Gerontological Nurse Practitioner [facility] Care Management [phone]

## 2020-04-06 NOTE — Progress Notes (Signed)
   Covid-19 Vaccination Clinic  Name:  Alfred Foster    MRN: [id number] DOB: 05-Aug-1946  04/06/2020  Alfred Foster was observed post Covid-19 immunization for 15 minutes without incident. He was provided with Vaccine Information Sheet and instruction to access the V-Safe system.   Alfred Foster was instructed to call 911 with any severe reactions post vaccine: Marland Kitchen Difficulty breathing  . Swelling of face and throat  . A fast heartbeat  . A bad rash all over body  . Dizziness and weakness   Immunizations Administered    Name Date Dose VIS Date Route   Pfizer COVID-19 Vaccine 04/06/2020  3:04 PM 0.3 mL 02/05/2019 Intramuscular   Manufacturer: [employer]   Lot: [id number]   [place]: [id number]

## 2020-04-09 ENCOUNTER — Other Ambulatory Visit: Payer: Self-pay | Admitting: *Deleted

## 2020-04-09 NOTE — Patient Outreach (Signed)
Alfred Foster) Care Management  04/09/2020  Alfred Foster 04/11/1946 [id number]  Second telephone outreach for quarterly disease management call for diabetes follow up and general health.  No answer, left message.  Will send an unsuccessful letter.  [name]. [name], MSN, [facility] Gerontological Nurse Practitioner [facility] Care Management [phone]

## 2020-04-10 ENCOUNTER — Ambulatory Visit: Payer: Medicare Other | Admitting: Family Medicine

## 2020-04-10 ENCOUNTER — Ambulatory Visit (INDEPENDENT_AMBULATORY_CARE_PROVIDER_SITE_OTHER): Payer: Medicare Other

## 2020-04-10 ENCOUNTER — Other Ambulatory Visit: Payer: Self-pay | Admitting: Radiology

## 2020-04-10 ENCOUNTER — Other Ambulatory Visit: Payer: Self-pay

## 2020-04-10 ENCOUNTER — Encounter: Payer: Self-pay | Admitting: Family Medicine

## 2020-04-10 ENCOUNTER — Other Ambulatory Visit: Payer: Self-pay | Admitting: Family Medicine

## 2020-04-10 ENCOUNTER — Other Ambulatory Visit: Payer: Medicare Other

## 2020-04-10 DIAGNOSIS — M7071 Other bursitis of hip, right hip: Secondary | ICD-10-CM

## 2020-04-10 DIAGNOSIS — S300XXA Contusion of lower back and pelvis, initial encounter: Secondary | ICD-10-CM | POA: Diagnosis not present

## 2020-04-10 NOTE — Progress Notes (Signed)
    Subjective:    CC: R leg pain  I, Molly Weber, LAT, ATC, am serving as scribe for Dr. Lynne Leader.  HPI: Pt is a 74 y/o male presenting w/ c/o R leg pain radiating from the R glute into the R LE since mid-March 2021.  He rates his pain as mild and describes his pain as shooting.  He denies any pain radiating past the level of the knee.  He notes pain with prolonged sitting and with standing from a seated position.  He feels pretty well otherwise no fevers or chills.  He denies significant weakness.  Radiating pain: Yes from R glute to R thigh R LE numbness/tingling: No R LE weakness: no Low back pain: No Aggravating factors: Prolonged sitting and driving Treatments tried: lumbar brace/support; exercises;    Pertinent review of Systems: No fevers or chills weight loss or night sweats.  Relevant historical information: History of prostate cancer 2007.  Recent PSA negative no recurrence.   Objective:    Vitals:   04/10/20 0915  BP: 120/76  Pulse: 74  SpO2: 97%   General: Well Developed, well nourished, and in no acute distress.   MSK:  Right hip normal-appearing normal motion. Nontender greater trochanter and piriformis area and buttocks. Tender palpation ischial tuberosity right side. Excellent strength hip abduction external rotation internal rotation adduction and resisted hip extension. Excellent strength with resisted knee flexion Mild antalgic gait when for standing from a seated position.  Lab and Radiology Results  X-ray images pelvis obtained today personally and independently reviewed No lytic lesion. No acute fractures.  Await formal radiology review  Impression and Recommendations:    Assessment and Plan: 74 y.o. male with right posterior hip pain.  Patient has point tenderness at ischial tuberosity.  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  This is akin to piriformis syndrome.  Initial treatment plan for  physical therapy and home exercise teaching is performed in clinic today by ATC.  Additionally we will try offloading using a doughnut pillow and trial of Voltaren gel.  Check back in a month if not better likely will proceed with diagnostic and therapeutic ischial bursitis injection..  [id number]; 15 additional minutes spent for Therapeutic exercises as stated in above notes.  This included exercises focusing on stretching, strengthening, with significant focus on eccentric aspects.   Long term goals include an improvement in range of motion, strength, endurance as well as avoiding reinjury. Patient's frequency would include in 1-2 times a day, 3-5 times a week for a duration of 6-12 weeks.  Proper technique shown and discussed handout in great detail with ATC.  All questions were discussed and answered.    PDMP not reviewed this encounter. Orders Placed This Encounter  Procedures  . Ambulatory referral to Physical Therapy    Referral Priority:   Routine    Referral Type:   Physical Medicine    Referral Reason:   Specialty Services Required    Requested Specialty:   Physical Therapy   No orders of the defined types were placed in this encounter.   Discussed warning signs or symptoms. Please see discharge instructions. Patient expresses understanding.   The above documentation has been reviewed and is accurate and complete Lynne Leader

## 2020-04-10 NOTE — Patient Instructions (Addendum)
Thank you for coming in today. I think you have ischial bursitis.  Plan for PT.  Do the home exercises.   Recheck in about 1 month.  If not better I can do a shot.   Get xray today.   Please perform the exercise program that we have prepared for you and gone over in detail on a daily basis.  In addition to the handout you were provided you can access your program through: [email or web address]   [email or web address] Your unique program code is:

## 2020-04-13 NOTE — Progress Notes (Signed)
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  No obvious change at the area of pain.

## 2020-04-15 ENCOUNTER — Telehealth: Payer: Self-pay | Admitting: Internal Medicine

## 2020-04-15 NOTE — Progress Notes (Signed)
°  Chronic Care Management   Outreach Note  04/15/2020 Name: Alfred Foster MRN: [id number] DOB: February 17, 1946  Referred by: Binnie Rail, MD Reason for referral : No chief complaint on file.   An unsuccessful telephone outreach was attempted today. The patient was referred to the pharmacist for assistance with care management and care coordination.    This note is not being shared with the patient for the following reason: To respect privacy (The patient or proxy has requested that the information not be shared).  Follow Up Plan:   Raynicia Dukes [employer] Scheduler

## 2020-04-23 ENCOUNTER — Encounter: Payer: Self-pay | Admitting: *Deleted

## 2020-04-23 ENCOUNTER — Other Ambulatory Visit: Payer: Self-pay | Admitting: *Deleted

## 2020-04-23 NOTE — Patient Outreach (Signed)
[facility]) Care Management  04/23/2020  Alfred Foster 31-Oct-1946 [id number]  Last outreach to re-establish communications for care management. Call was answered with message that said the recipient is not able to take the call. Left a message that NP is closing case but am available in the future for any needs.  [name]. [name], MSN, [facility] Gerontological Nurse Practitioner [facility] Care Management [phone]

## 2020-04-24 ENCOUNTER — Other Ambulatory Visit: Payer: Self-pay | Admitting: Internal Medicine

## 2020-04-24 ENCOUNTER — Ambulatory Visit: Payer: Medicare Other | Admitting: Podiatry

## 2020-05-08 ENCOUNTER — Other Ambulatory Visit: Payer: Self-pay

## 2020-05-08 ENCOUNTER — Encounter: Payer: Self-pay | Admitting: Family Medicine

## 2020-05-08 ENCOUNTER — Ambulatory Visit: Payer: Medicare Other | Admitting: Family Medicine

## 2020-05-08 VITALS — BP 140/90 | HR 71 | Ht 73.0 in | Wt 215.2 lb

## 2020-05-08 DIAGNOSIS — M7071 Other bursitis of hip, right hip: Secondary | ICD-10-CM

## 2020-05-08 NOTE — Progress Notes (Signed)
   I, Wendy Poet, LAT, ATC, am serving as scribe for Dr. Lynne Leader.  Alfred Foster is a 74 y.o. male who presents to [facility] today for f/u of R glute and leg pain.  He was last seen by Dr. Georgina Snell on 04/10/20 and was provided a HEP focusing on bridging and ITB and piriformis stretching.  He was also referred to outpatient PT but has not completed any visits.  Since his last visit w/ Dr. Georgina Snell, pt reports that he is feeling much better and is having no pain.  He has been doing his HEP 3x/week.  Diagnostic testing: Pelvis XR- 04/10/20  Pertinent review of systems: No fevers or chills  Relevant historical information: Diabetes history DVT prostate cancer carotid artery disease.   Exam:  BP 140/90 (BP Location: Right Arm, Patient Position: Sitting, Cuff Size: Large)   Pulse 71   Ht 6\' 1"  (1.854 m)   Wt 215 lb 3.2 oz (97.6 kg)   SpO2 98%   BMI 28.39 kg/m  General: Well Developed, well nourished, and in no acute distress.   MSK: Gluteus: Normal gait and motion.     Assessment and Plan: 74 y.o. male with significant improvement ischial bursitis.  Plan for continued home exercise program and recheck as needed.    Discussed warning signs or symptoms. Please see discharge instructions. Patient expresses understanding.   The above documentation has been reviewed and is accurate and complete Lynne Leader, M.D.

## 2020-05-14 ENCOUNTER — Other Ambulatory Visit: Payer: Self-pay | Admitting: Internal Medicine

## 2020-05-14 NOTE — Telephone Encounter (Signed)
He is supposed to be taking this right?

## 2020-05-22 ENCOUNTER — Other Ambulatory Visit: Payer: Self-pay

## 2020-05-22 ENCOUNTER — Telehealth: Payer: Self-pay | Admitting: Internal Medicine

## 2020-05-22 ENCOUNTER — Ambulatory Visit: Payer: Medicare Other | Admitting: Orthotics

## 2020-05-22 DIAGNOSIS — M2142 Flat foot [pes planus] (acquired), left foot: Secondary | ICD-10-CM | POA: Diagnosis not present

## 2020-05-22 DIAGNOSIS — E119 Type 2 diabetes mellitus without complications: Secondary | ICD-10-CM | POA: Diagnosis not present

## 2020-05-22 DIAGNOSIS — M2141 Flat foot [pes planus] (acquired), right foot: Secondary | ICD-10-CM | POA: Diagnosis not present

## 2020-05-22 NOTE — Progress Notes (Signed)
  Chronic Care Management   Outreach Note  05/22/2020 Name: MANNING LUNA MRN: [id number] DOB: [birth year]  Referred by: Binnie Rail, MD Reason for referral : No chief complaint on file.   An unsuccessful telephone outreach was attempted today. The patient was referred to the pharmacist for assistance with care management and care coordination. This note is not being shared with the patient for the following reason: To respect privacy (The patient or proxy has requested that the information not be shared).  Follow Up Plan:   [name] [employer] Scheduler

## 2020-05-25 NOTE — Progress Notes (Signed)
  Chronic Care Management   Outreach Note  05/25/2020 Name: Alfred Foster MRN: [id number] DOB: 1946-09-17  Referred by: Binnie Rail, MD Reason for referral : No chief complaint on file.   An unsuccessful telephone outreach was attempted today. The patient was referred to the pharmacist for assistance with care management and care coordination.  This note is not being shared with the patient for the following reason: To respect privacy (The patient or proxy has requested that the information not be shared).  Follow Up Plan:   [name] [employer] Scheduler

## 2020-05-26 ENCOUNTER — Ambulatory Visit: Payer: Medicare Other | Admitting: Orthotics

## 2020-05-26 ENCOUNTER — Other Ambulatory Visit: Payer: Self-pay

## 2020-05-26 ENCOUNTER — Encounter: Payer: Self-pay | Admitting: Sports Medicine

## 2020-05-26 ENCOUNTER — Ambulatory Visit: Payer: Medicare Other | Admitting: Sports Medicine

## 2020-05-26 DIAGNOSIS — M79674 Pain in right toe(s): Secondary | ICD-10-CM | POA: Diagnosis not present

## 2020-05-26 DIAGNOSIS — M79675 Pain in left toe(s): Secondary | ICD-10-CM

## 2020-05-26 DIAGNOSIS — D689 Coagulation defect, unspecified: Secondary | ICD-10-CM | POA: Diagnosis not present

## 2020-05-26 DIAGNOSIS — E119 Type 2 diabetes mellitus without complications: Secondary | ICD-10-CM | POA: Diagnosis not present

## 2020-05-26 DIAGNOSIS — B351 Tinea unguium: Secondary | ICD-10-CM | POA: Diagnosis not present

## 2020-05-26 NOTE — Progress Notes (Signed)
Subjective: Alfred Foster is a 74 y.o. male patient with history of diabetes who returns to office today complaining of long, painful nails while ambulating in shoes; unable to trim. Patient states that the glucose reading this morning was not recorded. No other issues.  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) ([place]) 09/12/2018   Carotid artery disease ([place]) 10/21/2017   Right carotid bruit 09/15/2017   Left shoulder pain 02/17/2016   Lipoma of skin of abdomen 03/19/2015   Nonspecific abnormal electrocardiogram (ECG) (EKG) 03/15/2012   Essential hypertension 10/22/2010   PROSTATE CANCER, HX OF 01/23/2009   Diabetes mellitus ([place]) 08/09/2007   Hyperlipidemia 05/08/2007   Acute hypoxemic respiratory failure due to COVID-19 [facility]) 2007   Current Outpatient Medications on File Prior to Visit  Medication Sig Dispense Refill   amLODipine (NORVASC) 10 MG tablet Take 1 tablet (10 mg total) by mouth daily. 90 tablet 3   apixaban (ELIQUIS) 5 MG TABS tablet Take 1 tablet (5 mg total) by mouth 2 (two) times daily. 60 tablet 3   Ascorbic Acid (VITAMIN C PO) Take 1 tablet by mouth daily.      aspirin 81 MG tablet Take 81 mg by mouth daily.     Cholecalciferol (VITAMIN D3) 1000 UNITS CAPS Take 1,000 Units by mouth daily.     dapagliflozin propanediol (FARXIGA) 5 MG TABS tablet Take 5 mg by mouth daily before breakfast. 30 tablet 5   Elastic Bandages & Supports (MEDICAL COMPRESSION SOCKS) MISC Use daily for leg swelling from DVT.  Dx: I82.461 2 each 0   feeding supplement, ENSURE ENLIVE, (ENSURE ENLIVE) LIQD Take 237 mLs by mouth 2 (two) times daily between meals. 094 mL 12   folic acid (FOLVITE) 709 MCG tablet Take 800 mcg by mouth daily.      Ipratropium-Albuterol (COMBIVENT) 20-100 MCG/ACT AERS respimat Inhale 1  puff into the lungs every 6 (six) hours as needed for wheezing or shortness of breath. 4 g 0   Lancets (ONETOUCH ULTRASOFT) lancets use as directed 100 each 1   losartan (COZAAR) 50 MG tablet Take 1 tablet (50 mg total) by mouth daily. (Patient taking differently: Take 25 mg by mouth daily. ) 90 tablet 3   metoprolol succinate (TOPROL-XL) 25 MG 24 hr tablet TAKE 3 TABLETS(75 MG) BY MOUTH DAILY 270 tablet 0   metoprolol succinate (TOPROL-XL) 50 MG 24 hr tablet Take 1 tablet (50 mg total) by mouth daily. Take with or immediately following a meal. 30 tablet 0   niacin 500 MG tablet Take 500 mg by mouth daily.      Omega-3 Fatty Acids (FISH OIL PO) Take 1,000 mg by mouth daily.      ONE TOUCH ULTRA TEST test strip use as directed 100 each 1   rosuvastatin (CRESTOR) 10 MG tablet TAKE 1 TABLET(10 MG) BY MOUTH DAILY 90 tablet 1   sitaGLIPtin (JANUVIA) 100 MG tablet Take 1 tablet (100 mg total) by mouth daily. 30 tablet 5   spironolactone (ALDACTONE) 25 MG tablet Take 1 tablet (25 mg total) by mouth daily. 30 tablet 5   zinc sulfate 220 (50 Zn) MG capsule Take 1 capsule (220 mg total) by mouth daily. 30 capsule 0   No current facility-administered medications on file prior to visit.   Allergies  Allergen Reactions  Hydrochlorothiazide     SOB, dizzy   Metformin And Related Other (See Comments)    Make him feel full and uncomfortable    Recent Results (from the past 2160 hour(s))  Hemoglobin A1c     Status: Abnormal   Collection Time: 03/20/20  8:14 AM  Result Value Ref Range   Hgb A1c MFr Bld 6.7 (H) 4.6 - 6.5 %    Comment: Glycemic Control Guidelines for People with Diabetes:Non Diabetic:  <6%Goal of Therapy: <7%Additional Action Suggested:  >8%   Comprehensive metabolic panel     Status: Abnormal   Collection Time: 03/20/20  8:14 AM  Result Value Ref Range   Sodium 140 135 - 145 mEq/L   Potassium 4.3 3.5 - 5.1 mEq/L   Chloride 108 96 - 112 mEq/L   CO2 24 19 - 32 mEq/L    Glucose, Bld 147 (H) 70 - 99 mg/dL   BUN 28 (H) 6 - 23 mg/dL   Creatinine, Ser 1.59 (H) 0.40 - 1.50 mg/dL   Total Bilirubin 0.7 0.2 - 1.2 mg/dL   Alkaline Phosphatase 79 39 - 117 U/L   AST 16 0 - 37 U/L   ALT 17 0 - 53 U/L   Total Protein 6.9 6.0 - 8.3 g/dL   Albumin 4.2 3.5 - 5.2 g/dL   GFR 51.94 (L) >60.00 mL/min   Calcium 9.3 8.4 - 10.5 mg/dL  Lipid panel     Status: Abnormal   Collection Time: 03/20/20  8:14 AM  Result Value Ref Range   Cholesterol 117 0 - 200 mg/dL    Comment: ATP III Classification       Desirable:  < 200 mg/dL               Borderline High:  200 - 239 mg/dL          High:  > = 240 mg/dL   Triglycerides 66.0 0 - 149 mg/dL    Comment: Normal:  <150 mg/dLBorderline High:  150 - 199 mg/dL   HDL 33.10 (L) >39.00 mg/dL   VLDL 13.2 0.0 - 40.0 mg/dL   LDL Cholesterol 71 0 - 99 mg/dL   Total CHOL/HDL Ratio 4     Comment:                Men          Women1/2 Average Risk     3.4          3.3Average Risk          5.0          4.42X Average Risk          9.6          7.13X Average Risk          15.0          11.0                       NonHDL 84.15     Comment: NOTE:  Non-HDL goal should be 30 mg/dL higher than patient's LDL goal (i.e. LDL goal of < 70 mg/dL, would have non-HDL goal of < 100 mg/dL)    Objective: General: Patient is awake, alert, and oriented x 3 and in no acute distress.  Integument: Skin is warm, dry and supple bilateral. Nails are tender, long, thickened and dystrophic with subungual debris, consistent with onychomycosis, 1-5 bilateral. No signs of infection. No open lesions. Remaining integument unremarkable.  Vasculature:  Dorsalis Pedis pulse 2/4 bilateral. Posterior Tibial pulse  1/4 left 0/4 on right. Capillary fill time <3 sec 1-5 bilateral. Positive hair growth to the level of the digits.Temperature gradient within normal limits. No varicosities present bilateral.  Neurology: The patient has intact sensation measured with a 5.07/10g Semmes  Weinstein Monofilament at all pedal sites bilateral . Vibratory sensation diminished bilateral with tuning fork. No Babinski sign present bilateral.   Musculoskeletal: Asymptomatic bunion and planus foot type pedal deformities noted bilateral. Muscular strength 5/5 in all lower extremity muscular groups bilateral without pain on range of motion. No tenderness with calf compression bilateral.  Assessment and Plan: Problem List Items Addressed This Visit    None    Visit Diagnoses    Pain due to onychomycosis of toenails of both feet    -  Primary   Controlled type 2 diabetes mellitus without complication, without long-term current use of insulin (HCC)       Coagulopathy ([place])          -Examined patient. -Re-Discussed and educated patient on diabetic foot care, especially with  regards to the vascular, neurological and musculoskeletal systems.  -Mechanically debrided all nails 1-5 bilateral using sterile nail nipper and filed with dremel without incident.  -Continue with diabetic shoes -Patient to return in 3 months for at risk foot care -Patient advised to call the office if any problems or questions arise in the meantime.   Landis Martins, DPM

## 2020-06-10 ENCOUNTER — Telehealth: Payer: Self-pay | Admitting: Internal Medicine

## 2020-06-10 NOTE — Progress Notes (Signed)
  Chronic Care Management   Outreach Note  06/10/2020 Name: Alfred Foster MRN: [id number] DOB: December 16, 1945  Referred by: Binnie Rail, MD Reason for referral : No chief complaint on file.   An unsuccessful telephone outreach was attempted today. The patient was referred to the pharmacist for assistance with care management and care coordination. This note is not being shared with the patient for the following reason: To respect privacy (The patient or proxy has requested that the information not be shared).  Follow Up Plan:   [name] [employer] Scheduler

## 2020-06-17 ENCOUNTER — Telehealth: Payer: Self-pay | Admitting: Internal Medicine

## 2020-06-17 NOTE — Progress Notes (Signed)
  Chronic Care Management   Note  06/17/2020 Name: Alfred Foster MRN: [id number] DOB: 1946/09/12  Alfred Foster is a 74 y.o. year old male who is a primary care patient of Burns, Claudina Lick, MD. I reached out to Alfred Foster by phone today in response to a referral sent by [name]'s PCP, Binnie Rail, MD.   [name] was given information about Chronic Care Management services today including:  1. CCM service includes personalized support from designated clinical staff supervised by his physician, including individualized plan of care and coordination with other care providers 2. 24/7 contact phone numbers for assistance for urgent and routine care needs. 3. Service will only be billed when office clinical staff spend 20 minutes or more in a month to coordinate care. 4. Only one practitioner may furnish and bill the service in a calendar month. 5. The patient may stop CCM services at any time (effective at the end of the month) by phone call to the office staff.   Patient agreed to services and verbal consent obtained.  This note is not being shared with the patient for the following reason: To respect privacy (The patient or proxy has requested that the information not be shared).  Follow up plan:   [name] [employer] Scheduler

## 2020-06-26 ENCOUNTER — Ambulatory Visit: Payer: Medicare Other | Admitting: Podiatry

## 2020-08-14 ENCOUNTER — Telehealth: Payer: Self-pay | Admitting: Internal Medicine

## 2020-08-14 NOTE — Telephone Encounter (Signed)
Patient dropped of [place] papers to care for his wife Patient requesting a copy of the forms and to call him when complete.

## 2020-08-18 ENCOUNTER — Other Ambulatory Visit: Payer: Self-pay | Admitting: Internal Medicine

## 2020-08-19 ENCOUNTER — Other Ambulatory Visit: Payer: Self-pay | Admitting: Internal Medicine

## 2020-08-20 DIAGNOSIS — Z0279 Encounter for issue of other medical certificate: Secondary | ICD-10-CM

## 2020-08-20 NOTE — Telephone Encounter (Signed)
TE needed to be placed in patients chart that the forms are about.  I have placed information in Vertie's chart.

## 2020-09-16 ENCOUNTER — Telehealth: Payer: Self-pay | Admitting: Internal Medicine

## 2020-09-16 ENCOUNTER — Other Ambulatory Visit: Payer: Self-pay

## 2020-09-16 DIAGNOSIS — I10 Essential (primary) hypertension: Secondary | ICD-10-CM

## 2020-09-16 NOTE — Telephone Encounter (Signed)
    Spouse calling to ask if patient is current with all of his immunizations. Does he need a pneumonia shot?

## 2020-09-17 ENCOUNTER — Other Ambulatory Visit: Payer: Self-pay | Admitting: Internal Medicine

## 2020-09-17 IMAGING — DX DG CHEST 1V PORT
1 series · 1 of 1 positions shown · non-contrast
Comparison: 01/29/2018

CLINICAL DATA: Shortness of breath, chest pain

EXAM:
PORTABLE CHEST 1 VIEW

[chest]
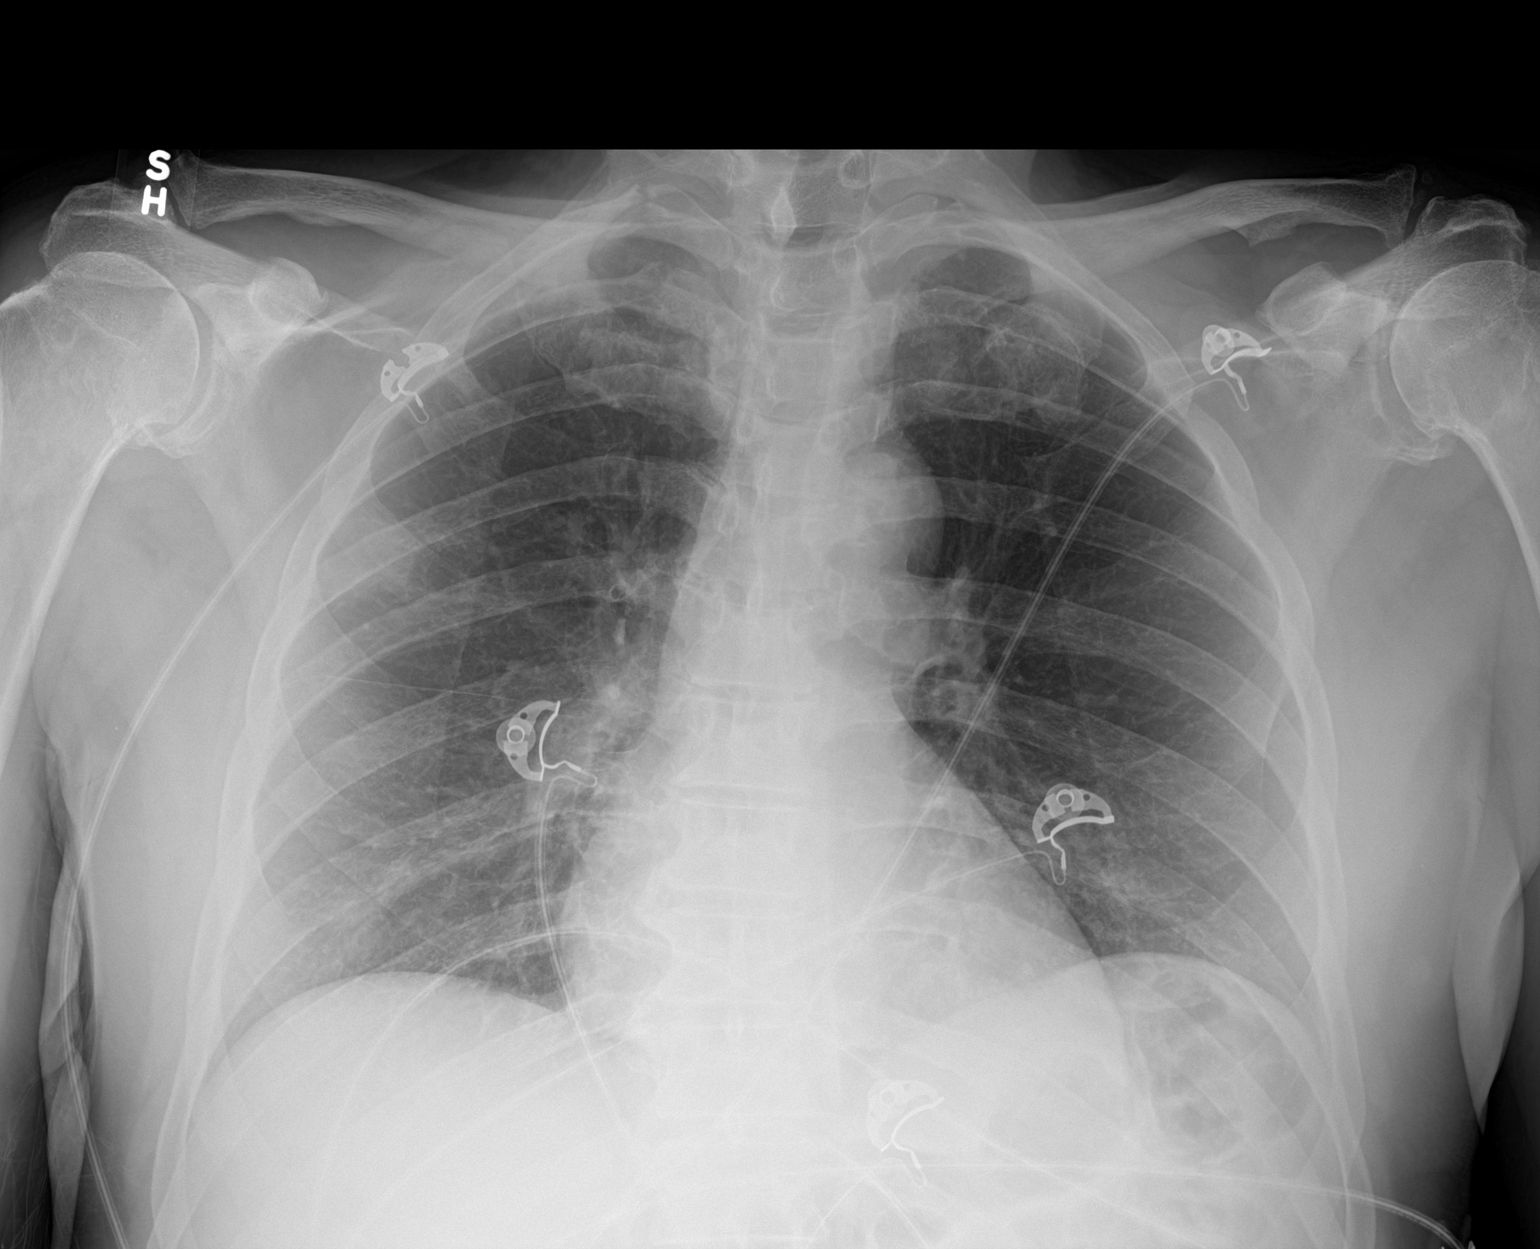

[1 of 1 positions shown; findings below may reference images not displayed]

FINDINGS: The heart size and mediastinal contours are within normal limits.
Subtle patchy bibasilar airspace opacities. No pleural effusion. No
pneumothorax. Degenerative changes of the shoulders, left worse than
right.
IMPRESSION: Subtle patchy bibasilar airspace opacities could reflect atelectasis
or developing infection.

## 2020-09-17 NOTE — Telephone Encounter (Signed)
Spoke with patient's wife today and info given. Patient is due for tetanus/tdap and flu vaccine.

## 2020-09-18 ENCOUNTER — Ambulatory Visit: Payer: Medicare Other

## 2020-09-18 NOTE — Chronic Care Management (AMB) (Deleted)
Chronic Care Management Pharmacy  Name: Alfred Foster  MRN: [id number] DOB: [birth year]   Chief Complaint/ HPI  Alfred Foster,  74 y.o. , male presents for their Initial CCM visit with the clinical pharmacist via telephone due to COVID-19 Pandemic.  PCP : Binnie Rail, MD Patient Care Team: Binnie Rail, MD as PCP - General (Internal Medicine) Charlton Haws, [facility] as Pharmacist (Pharmacist)  Their chronic conditions include: Hypertension, Hyperlipidemia and Diabetes, Chronic DVT, carotid artery disease, back pain  Office Visits: 03/20/20 Dr Quay Burow OV: chronic f/u, kidney function slightly worse (likely d/t spiro), other labs stable, no med changes. Referred to sports med for R leg pain.  02/28/20 Dr Quay Burow OV: f/u for HTN, elevated at home. No improvement after increasing losartan, so decreased back to 50 mg. Added Spironolactone 25 mg.   01/24/20 Dr Quay Burow OV: f/u for HTN, elevated at home. Increased losartan to 100 mg.  Consult Visit: 04/28/20 Dr Georgina Snell (sports med): ischial bursitis, home exercise 03/25/20 Dr Alinda Money (urology): via claims. Hx prostate cancer   Allergies  Allergen Reactions  . Hydrochlorothiazide     SOB, dizzy  . Metformin And Related Other (See Comments)    Make him feel full and uncomfortable    Medications: Outpatient Encounter Medications as of 09/18/2020  Medication Sig  . amLODipine (NORVASC) 10 MG tablet Take 1 tablet (10 mg total) by mouth daily.  Marland Kitchen apixaban (ELIQUIS) 5 MG TABS tablet Take 1 tablet (5 mg total) by mouth 2 (two) times daily.  . Ascorbic Acid (VITAMIN C PO) Take 1 tablet by mouth daily.   Marland Kitchen aspirin 81 MG tablet Take 81 mg by mouth daily.  . Cholecalciferol (VITAMIN D3) 1000 UNITS CAPS Take 1,000 Units by mouth daily.  . dapagliflozin propanediol (FARXIGA) 5 MG TABS tablet Take 5 mg by mouth daily before breakfast.  . 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  Dx: C14.481    . feeding supplement, ENSURE ENLIVE, (ENSURE ENLIVE) LIQD Take 237 mLs by mouth 2 (two) times daily between meals.  . folic acid (FOLVITE) 856 MCG tablet Take 800 mcg by mouth daily.   . Ipratropium-Albuterol (COMBIVENT) 20-100 MCG/ACT AERS respimat Inhale 1 puff into the lungs every 6 (six) hours as needed for wheezing or shortness of breath.  . Lancets (ONETOUCH ULTRASOFT) lancets use as directed  . losartan (COZAAR) 50 MG tablet Take 1 tablet (50 mg total) by mouth daily. (Patient taking differently: Take 25 mg by mouth daily. )  . metoprolol succinate (TOPROL-XL) 25 MG 24 hr tablet Take 3 tablets (75 mg total) by mouth daily. Follow-up appt due in Oct must see provider for future refills  . metoprolol succinate (TOPROL-XL) 50 MG 24 hr tablet Take 1 tablet (50 mg total) by mouth daily. Take with or immediately following a meal.  . niacin 500 MG tablet Take 500 mg by mouth daily.   . Omega-3 Fatty Acids (FISH OIL PO) Take 1,000 mg by mouth daily.   . ONE TOUCH ULTRA TEST test strip use as directed  . rosuvastatin (CRESTOR) 10 MG tablet TAKE 1 TABLET(10 MG) BY MOUTH DAILY  . sitaGLIPtin (JANUVIA) 100 MG tablet Take 1 tablet (100 mg total) by mouth daily.  Marland Kitchen spironolactone (ALDACTONE) 25 MG tablet Take 1 tablet (25 mg total) by mouth daily.  Marland Kitchen zinc sulfate 220 (50 Zn) MG capsule Take 1 capsule (220 mg total) by mouth daily.   No  facility-administered encounter medications on file as of 09/18/2020.    Wt Readings from Last 3 Encounters:  05/08/20 215 lb 3.2 oz (97.6 kg)  04/10/20 215 lb 12.8 oz (97.9 kg)  03/20/20 213 lb (96.6 kg)    Current Diagnosis/Assessment:    Goals Addressed   None     Hypertension   BP goal is:  <130/80  Office blood pressures are  BP Readings from Last 3 Encounters:  05/08/20 140/90  04/10/20 120/76  03/20/20 124/72   Kidney Function Lab Results  Component Value Date/Time   CREATININE 1.59 (H) 03/20/2020 08:14 AM   CREATININE 1.31 01/10/2020  08:47 AM   GFR 51.94 (L) 03/20/2020 08:14 AM   GFRNONAA >60 12/14/2019 05:00 AM   GFRAA >60 12/14/2019 05:00 AM   K 4.3 03/20/2020 08:14 AM   K 4.5 01/10/2020 08:47 AM   Patient checks BP at home {CHL HP BP Monitoring Frequency:[phone]} Patient home BP readings are ranging: ***  Patient has failed these meds in the past: *** Patient is currently {CHL Controlled/Uncontrolled:[phone]} on the following medications:  . Amlodipine 10 mg daily  - not on dispense report . Losartan 50 mg (25 mg) daily . Metoprolol succinate 25 mg - 3 tablets daily . Metoprolol succinate 50 mg daily  - not on dispense report . Spironolactone 25 mg daily  - not on dispense report  We discussed {CHL HP Upstream Pharmacy [phone]}  Plan  Continue {CHL HP Upstream Pharmacy [phone]}   Hyperlipidemia   LDL goal < 100  Lipid Panel     Component Value Date/Time   CHOL 117 03/20/2020 0814   TRIG 66.0 03/20/2020 0814   HDL 33.10 (L) 03/20/2020 0814   LDLCALC 71 03/20/2020 0814    Hepatic Function Latest Ref Rng & Units 03/20/2020 01/10/2020 12/14/2019  Total Protein 6.0 - 8.3 g/dL 6.9 6.8 5.6(L)  Albumin 3.5 - 5.2 g/dL 4.2 4.0 2.7(L)  AST 0 - 37 U/L '16 16 18  ' ALT 0 - 53 U/L '17 14 23  ' Alk Phosphatase 39 - 117 U/L 79 95 54  Total Bilirubin 0.2 - 1.2 mg/dL 0.7 0.5 0.6  Bilirubin, Direct 0.0 - 0.3 mg/dL - - -     The ASCVD Risk score (Talmage., et al., 2013) failed to calculate for the following reasons:   The valid total cholesterol range is 130 to 320 mg/dL   Patient has failed these meds in past: *** Patient is currently {CHL Controlled/Uncontrolled:[phone]} on the following medications:  . Rosuvastatin 10 mg daily . Aspirin 81 mg daily . OTC Fish oil  We discussed:  {CHL HP Upstream Pharmacy [phone]}  Plan  Continue {CHL HP Upstream Pharmacy [phone]}  Diabetes   A1c goal <7%  Recent Relevant Labs: Lab Results  Component Value  Date/Time   HGBA1C 6.7 (H) 03/20/2020 08:14 AM   HGBA1C 7.4 (H) 01/10/2020 08:47 AM   GFR 51.94 (L) 03/20/2020 08:14 AM   GFR 64.99 01/10/2020 08:47 AM   MICROALBUR 1.7 01/10/2020 08:47 AM   MICROALBUR 1.1 09/13/2016 03:10 PM    Last diabetic Eye exam:  Lab Results  Component Value Date/Time   HMDIABEYEEXA No Retinopathy 08/16/2019 12:00 AM    Last diabetic Foot exam:  Lab Results  Component Value Date/Time   HMDIABFOOTEX done 11/29/2019 12:00 AM     Checking BG: {CHL HP Blood Glucose Monitoring [phone]}  Recent FBG Readings: *** Recent pre-meal BG readings: *** Recent 2hr PP BG readings:  *** Recent HS BG  readings: ***  Patient has failed these meds in past: metformin Patient is currently {CHL Controlled/Uncontrolled:[phone]} on the following medications: . Farxiga 5 mg daily - not on dispense report . Januvia 100 mg daily - not on dispense report . BG testing supplies  We discussed: {CHL HP Upstream Pharmacy discussion:[phone]}  Plan  Continue {CHL HP Upstream Pharmacy Plans:[phone]}  DVT   09/2018 - DVT, Eliquis x 6 months 09/2019 - recurrent DVT, lifelong anticoagulation  Patient has failed these meds in past: *** Patient is currently {CHL Controlled/Uncontrolled:[phone]} on the following medications:  . Eliquis 5 mg BID  - LF 30ds 03/29/20  We discussed:  ***  Plan  Continue {CHL HP Upstream Pharmacy EYEMV:[id number]}  Respiratory failure   COVID 09/2019  Patient has failed these meds in past: *** Patient is currently {CHL Controlled/Uncontrolled:[phone]} on the following medications:  . Combivent 1 puff q6h PRN  We discussed:  ***  Plan  Continue {CHL HP Upstream Pharmacy PYYFR:[id number]}  Vaccines   Reviewed and discussed patient's vaccination history.    Immunization History  Administered Date(s) Administered  . Fluad Quad(high Dose 65+) 09/06/2019  . Influenza Split 10/12/2011, 08/24/2012  . Influenza  Whole 09/25/2007, 09/30/2008, 09/09/2009, 09/07/2010  . Influenza, High Dose Seasonal PF 09/03/2014, 08/18/2016, 09/15/2017, 09/19/2018  . Influenza,inj,Quad PF,6+ Mos 09/17/2013, 09/07/2015  . PFIZER SARS-COV-2 Vaccination 03/13/2020, 04/06/2020  . Pneumococcal Conjugate-13 10/28/2014  . Pneumococcal Polysaccharide-23 11/12/2012  . Td 04/21/2010  . Zoster 11/23/2012  . Zoster Recombinat (Shingrix) 06/09/2017, 08/15/2017    Plan  Recommended patient receive *** vaccine in *** office.   Health Maintenance   Patient is currently {CHL Controlled/Uncontrolled:[phone]} on the following medications:  Marland Kitchen Vitamin C . Vitamin D 1000 IU daily . Niacin 500 mg daily . Zinc 220 mg daily . Folic acid 701 mcg daily . Ensure Jeanne Ivan  We discussed:  ***  Plan  Continue {CHL HP Upstream Pharmacy IDCVU:[id number]}  Medication Management   Pt uses *** pharmacy for all medications Uses pill box? {Yes or If no, why not?:20788} Pt endorses ***% compliance  We discussed: {Pharmacy options:24294}  Plan  {US Pharmacy ZVJK:82060}    Follow up: *** month phone visit  ***

## 2020-09-22 ENCOUNTER — Telehealth: Payer: Self-pay | Admitting: Internal Medicine

## 2020-09-22 ENCOUNTER — Ambulatory Visit: Payer: Medicare Other | Attending: Critical Care Medicine

## 2020-09-22 DIAGNOSIS — Z23 Encounter for immunization: Secondary | ICD-10-CM

## 2020-09-22 NOTE — Progress Notes (Signed)
   Covid-19 Vaccination Clinic  Name:  VINOD MIKESELL    MRN: [id number] DOB: [birth year]  09/22/2020  Mr. Fennewald was observed post Covid-19 immunization for 15 minutes without incident. He was provided with Vaccine Information Sheet and instruction to access the V-Safe system.   Mr. Trotta was instructed to call 911 with any severe reactions post vaccine: Marland Kitchen Difficulty breathing  . Swelling of face and throat  . A fast heartbeat  . A bad rash all over body  . Dizziness and weakness

## 2020-09-22 NOTE — Progress Notes (Signed)
°  Chronic Care Management   Outreach Note  09/22/2020 Name: Alfred Foster MRN: [id number] DOB: 12-Jun-1946  Referred by: Binnie Rail, MD Reason for referral : No chief complaint on file.   An unsuccessful telephone outreach was attempted today. The patient was referred to the pharmacist for assistance with care management and care coordination.   Follow Up Plan:   Carley Perdue [facility] Scheduler

## 2020-09-23 ENCOUNTER — Telehealth: Payer: Self-pay | Admitting: Internal Medicine

## 2020-09-23 NOTE — Progress Notes (Signed)
  Chronic Care Management   Outreach Note  09/23/2020 Name: Alfred Foster MRN: [id number] DOB: 03-13-1946  Referred by: Binnie Rail, MD Reason for referral : No chief complaint on file.   A second unsuccessful telephone outreach was attempted today. The patient was referred to pharmacist for assistance with care management and care coordination.  Follow Up Plan:   Carley Perdue [facility] Scheduler

## 2020-09-24 ENCOUNTER — Telehealth: Payer: Self-pay | Admitting: Internal Medicine

## 2020-09-24 NOTE — Progress Notes (Signed)
  Chronic Care Management   Outreach Note  09/24/2020 Name: Alfred Foster MRN: [id number] DOB: 11-26-1946  Referred by: Binnie Rail, MD Reason for referral : No chief complaint on file.   Third unsuccessful telephone outreach was attempted today. The patient was referred to the pharmacist for assistance with care management and care coordination.   Follow Up Plan:   Carley Perdue [facility] Scheduler

## 2020-09-26 ENCOUNTER — Ambulatory Visit (INDEPENDENT_AMBULATORY_CARE_PROVIDER_SITE_OTHER): Payer: Medicare Other

## 2020-09-26 DIAGNOSIS — Z23 Encounter for immunization: Secondary | ICD-10-CM | POA: Diagnosis not present

## 2020-09-30 ENCOUNTER — Other Ambulatory Visit: Payer: Self-pay

## 2020-10-07 ENCOUNTER — Telehealth: Payer: Self-pay | Admitting: Internal Medicine

## 2020-10-07 NOTE — Telephone Encounter (Signed)
LVM for pt to rtn my call to schedule AWV with [facility]. If patient calls the office please schedule AWV with [facility]. °

## 2020-10-08 DIAGNOSIS — D6869 Other thrombophilia: Secondary | ICD-10-CM | POA: Insufficient documentation

## 2020-10-08 NOTE — Progress Notes (Addendum)
Subjective:    Patient ID: Alfred Foster, male    DOB: 07/06/1946, 74 y.o.   MRN: [id number]  HPI He is here for a physical exam.   He has no concerns.   Medications and allergies reviewed with patient and updated if appropriate.  Patient Active Problem List   Diagnosis Date Noted  . Secondary hypercoagulable state ([place]) 10/08/2020  . Ischial bursitis of right side 04/10/2020  . Lumbar radiculopathy, acute 02/28/2020  . Side pain 09/06/2019  . 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) ([place]) 09/12/2018  . Carotid artery disease ([place]) 10/21/2017  . Right carotid bruit 09/15/2017  . Lipoma of skin of abdomen 03/19/2015  . Nonspecific abnormal electrocardiogram (ECG) (EKG) 03/15/2012  . Essential hypertension 10/22/2010  . PROSTATE CANCER, HX OF 01/23/2009  . Diabetes mellitus ([place]) 08/09/2007  . Hyperlipidemia 05/08/2007    Current Outpatient Medications on File Prior to Visit  Medication Sig Dispense Refill  . amLODipine (NORVASC) 10 MG tablet Take 1 tablet (10 mg total) by mouth daily. 90 tablet 3  . Ascorbic Acid (VITAMIN C PO) Take 1 tablet by mouth daily.     Marland Kitchen aspirin 81 MG tablet Take 81 mg by mouth daily.    . Cholecalciferol (VITAMIN D3) 1000 UNITS CAPS Take 1,000 Units by mouth daily.    . 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  Dx: X32.355 2 each 0  . folic acid (FOLVITE) 732 MCG tablet Take 800 mcg by mouth daily.     . Ipratropium-Albuterol (COMBIVENT) 20-100 MCG/ACT AERS respimat Inhale 1 puff into the lungs every 6 (six) hours as needed for wheezing or shortness of breath. 4 g 0  . Lancets (ONETOUCH ULTRASOFT) lancets use as directed 100 each 1  . metoprolol succinate (TOPROL-XL) 50 MG 24 hr tablet Take 1 tablet (50 mg total) by mouth daily. Take with or immediately following a meal. 30 tablet 0  . niacin 500 MG tablet Take 500 mg by mouth daily.     . Omega-3 Fatty Acids (FISH OIL PO) Take 1,000 mg by  mouth daily.     . ONE TOUCH ULTRA TEST test strip use as directed 100 each 1  . rosuvastatin (CRESTOR) 10 MG tablet TAKE 1 TABLET(10 MG) BY MOUTH DAILY 90 tablet 1   No current facility-administered medications on file prior to visit.    Past Medical History:  Diagnosis Date  . Diabetes mellitus   . Hyperlipidemia   . Hypertension   . Prostate cancer [facility] 2007   Dr Alinda Money    Past Surgical History:  Procedure Laterality Date  . COLONOSCOPY  2004 & 2014   Negative ;[facility]  . INGUINAL HERNIA REPAIR    . PROSTATECTOMY  04/2006   Dr Alinda Money  . UMBILICAL HERNIA REPAIR      Social History   Socioeconomic History  . Marital status: Married    Spouse name: Not on file  . Number of children: Not on file  . Years of education: Not on file  . Highest education level: Not on file  Occupational History  . Not on file  Tobacco Use  . Smoking status: Never Smoker  . Smokeless tobacco: Never Used  Substance and Sexual Activity  . Alcohol use: No  . Drug use: No  . Sexual activity: Not on file  Other Topics Concern  . Not on file  Social History Narrative  . Not on file  Social Determinants of Health   Financial Resource Strain:   . Difficulty of Paying Living Expenses: Not on file  Food Insecurity:   . Worried About Charity fundraiser in the Last Year: Not on file  . Ran Out of Food in the Last Year: Not on file  Transportation Needs:   . Lack of Transportation (Medical): Not on file  . Lack of Transportation (Non-Medical): Not on file  Physical Activity:   . Days of Exercise per Week: Not on file  . Minutes of Exercise per Session: Not on file  Stress:   . Feeling of Stress : Not on file  Social Connections:   . Frequency of Communication with Friends and Family: Not on file  . Frequency of Social Gatherings with Friends and Family: Not on file  . Attends Religious Services: Not on file  . Active Member of Clubs or Organizations: Not on file  . Attends English as a second language teacher Meetings: Not on file  . Marital Status: Not on file    Family History  Problem Relation Age of Onset  . Diabetes Father   . Hypertension Father   . Alzheimer's disease Father   . Hypertension Mother   . Alzheimer's disease Mother   . Heart attack Mother 84  . Diabetes Brother   . Heart disease Brother   . Heart attack Maternal Aunt        in 36s  . Stroke Neg Hx     Review of Systems  Constitutional: Negative for fever.  Eyes: Negative for visual disturbance.  Respiratory: Positive for shortness of breath (occ, with moderate exertion). Negative for cough and wheezing.   Cardiovascular: Negative for chest pain, palpitations and leg swelling.  Gastrointestinal: Negative for abdominal pain, blood in stool, constipation, diarrhea and nausea.       No gerd  Genitourinary: Negative for difficulty urinating, dysuria and hematuria.  Musculoskeletal: Positive for back pain (occ lower back pain). Negative for arthralgias.  Skin: Negative for rash.  Neurological: Negative for dizziness, light-headedness, numbness and headaches.  Psychiatric/Behavioral: Negative for dysphoric mood. The patient is not nervous/anxious.        Objective:   Vitals:   10/09/20 0755  BP: (!) 142/90  Pulse: 85  Temp: 98 F (36.7 C)  SpO2: 99%   Filed Weights   10/09/20 0755  Weight: 218 lb (98.9 kg)   Body mass index is 28.76 kg/m.  BP Readings from Last 3 Encounters:  10/09/20 (!) 142/90  05/08/20 140/90  04/10/20 120/76    Wt Readings from Last 3 Encounters:  10/09/20 218 lb (98.9 kg)  05/08/20 215 lb 3.2 oz (97.6 kg)  04/10/20 215 lb 12.8 oz (97.9 kg)   Depression screen [facility] 2/9 10/09/2020 10/04/2019 09/19/2018 09/15/2017 04/04/2017  Decreased Interest 0 0 0 0 0  Down, Depressed, Hopeless 0 0 0 0 0  PHQ - 2 Score 0 0 0 0 0  Altered sleeping 0 - 0 0 -  Tired, decreased energy 0 - 0 0 -  Change in appetite 0 - 0 0 -  Feeling bad or failure about yourself  0 - 0 0 -    Trouble concentrating 0 - 0 0 -  Moving slowly or fidgety/restless 0 - 0 0 -  Suicidal thoughts 0 - 0 0 -  PHQ-9 Score 0 - 0 0 -  Difficult doing work/chores Not difficult at all - - Not difficult at all -      Physical Exam  Constitutional: He appears well-developed and well-nourished. No distress.  HENT:  Head: Normocephalic and atraumatic.  Right Ear: External ear normal.  Left Ear: External ear normal.  Mouth/Throat: Oropharynx is clear and moist.  Normal ear canals and TM b/l  Eyes: Conjunctivae and EOM are normal.  Neck: Neck supple. No tracheal deviation present. No thyromegaly present.  No carotid bruit  Cardiovascular: Normal rate, regular rhythm, normal heart sounds and intact distal pulses.   No murmur heard. Pulmonary/Chest: Effort normal and breath sounds normal. No respiratory distress. He has no wheezes. He has no rales.  Abdominal: Soft. He exhibits no distension. There is no tenderness.  Genitourinary: deferred  Musculoskeletal: He exhibits no edema. Right lower leg slightly larger than left Lymphadenopathy:   He has no cervical adenopathy.  Skin: Skin is warm and dry. He is not diaphoretic.  Psychiatric: He has a normal mood and affect. His behavior is normal.         Assessment & Plan:   Physical exam: Screening blood work  ordered Immunizations  tdap due, others up to date Colonoscopy   Up to date  Eye exams   scheduled Exercise   Very active at work, does some weights, yard work [employer]  [place] for age Substance abuse   None   Screened for depression using the PHQ 9 scale.  No evidence of depression. He does feel down at times which is transient due to his wife's circumstances.     See Problem List for Assessment and Plan of chronic medical problems.   This visit occurred during the SARS-CoV-2 public health emergency.  Safety protocols were in place, including screening questions prior to the visit, additional usage of staff PPE, and extensive  cleaning of exam room while observing appropriate contact time as indicated for disinfecting solutions.

## 2020-10-08 NOTE — Patient Instructions (Addendum)
Blood work was ordered.    All other Health Maintenance issues reviewed.   All recommended immunizations and age-appropriate screenings are up-to-date or discussed.  No immunization administered today.   Medications reviewed and updated.  Changes include :   Restart eliquis, farxiga  Your prescription(s) have been submitted to your pharmacy. Please take as directed and contact our office if you believe you are having problem(s) with the medication(s).   Please followup in 6 months    Health Maintenance, Male Adopting a healthy lifestyle and getting preventive care are important in promoting health and wellness. Ask your health care provider about:  The right schedule for you to have regular tests and exams.  Things you can do on your own to prevent diseases and keep yourself healthy. What should I know about diet, weight, and exercise? Eat a healthy diet   Eat a diet that includes plenty of vegetables, fruits, low-fat dairy products, and lean protein.  Do not eat a lot of foods that are high in solid fats, added sugars, or sodium. Maintain a healthy weight Body mass index (BMI) is a measurement that can be used to identify possible weight problems. It estimates body fat based on height and weight. Your health care provider can help determine your BMI and help you achieve or maintain a healthy weight. Get regular exercise Get regular exercise. This is one of the most important things you can do for your health. Most adults should:  Exercise for at least 150 minutes each week. The exercise should increase your heart rate and make you sweat (moderate-intensity exercise).  Do strengthening exercises at least twice a week. This is in addition to the moderate-intensity exercise.  Spend less time sitting. Even light physical activity can be beneficial. Watch cholesterol and blood lipids Have your blood tested for lipids and cholesterol at 73 years of age, then have this test every 5  years. You may need to have your cholesterol levels checked more often if:  Your lipid or cholesterol levels are high.  You are older than 73 years of age.  You are at high risk for heart disease. What should I know about cancer screening? Many types of cancers can be detected early and may often be prevented. Depending on your health history and family history, you may need to have cancer screening at various ages. This may include screening for:  Colorectal cancer.  Prostate cancer.  Skin cancer.  Lung cancer. What should I know about heart disease, diabetes, and high blood pressure? Blood pressure and heart disease  High blood pressure causes heart disease and increases the risk of stroke. This is more likely to develop in people who have high blood pressure readings, are of African descent, or are overweight.  Talk with your health care provider about your target blood pressure readings.  Have your blood pressure checked: ? Every 3-5 years if you are 47-74 years of age. ? Every year if you are 45 years old or older.  If you are between the ages of 9 and 26 and are a current or former smoker, ask your health care provider if you should have a one-time screening for abdominal aortic aneurysm (AAA). Diabetes Have regular diabetes screenings. This checks your fasting blood sugar level. Have the screening done:  Once every three years after age 69 if you are at a normal weight and have a low risk for diabetes.  More often and at a younger age if you are overweight or have  a high risk for diabetes. What should I know about preventing infection? Hepatitis B If you have a higher risk for hepatitis B, you should be screened for this virus. Talk with your health care provider to find out if you are at risk for hepatitis B infection. Hepatitis C Blood testing is recommended for:  Everyone born from [birth year] through 1964.  Anyone with known risk factors for hepatitis C. Sexually  transmitted infections (STIs)  You should be screened each year for STIs, including gonorrhea and chlamydia, if: ? You are sexually active and are younger than 73 years of age. ? You are older than 73 years of age and your health care provider tells you that you are at risk for this type of infection. ? Your sexual activity has changed since you were last screened, and you are at increased risk for chlamydia or gonorrhea. Ask your health care provider if you are at risk.  Ask your health care provider about whether you are at high risk for HIV. Your health care provider may recommend a prescription medicine to help prevent HIV infection. If you choose to take medicine to prevent HIV, you should first get tested for HIV. You should then be tested every 3 months for as long as you are taking the medicine. Follow these instructions at home: Lifestyle  Do not use any products that contain nicotine or tobacco, such as cigarettes, e-cigarettes, and chewing tobacco. If you need help quitting, ask your health care provider.  Do not use street drugs.  Do not share needles.  Ask your health care provider for help if you need support or information about quitting drugs. Alcohol use  Do not drink alcohol if your health care provider tells you not to drink.  If you drink alcohol: ? Limit how much you have to 0-2 drinks a day. ? Be aware of how much alcohol is in your drink. In the [place] one drink equals one 12 oz bottle of beer (355 mL), one 5 oz glass of wine (148 mL), or one 1 oz glass of hard liquor (44 mL). General instructions  Schedule regular health, dental, and eye exams.  Stay current with your vaccines.  Tell your health care provider if: ? You often feel depressed. ? You have ever been abused or do not feel safe at home. Summary  Adopting a healthy lifestyle and getting preventive care are important in promoting health and wellness.  Follow your health care provider's  instructions about healthy diet, exercising, and getting tested or screened for diseases.  Follow your health care provider's instructions on monitoring your cholesterol and blood pressure. This information is not intended to replace advice given to you by your health care provider. Make sure you discuss any questions you have with your health care provider. Document Revised: 11/21/2018 Document Reviewed: 11/21/2018 Elsevier Patient Education  2020 [employer].

## 2020-10-09 ENCOUNTER — Other Ambulatory Visit: Payer: Self-pay

## 2020-10-09 ENCOUNTER — Encounter: Payer: Self-pay | Admitting: Internal Medicine

## 2020-10-09 ENCOUNTER — Ambulatory Visit (INDEPENDENT_AMBULATORY_CARE_PROVIDER_SITE_OTHER): Payer: Medicare Other | Admitting: Internal Medicine

## 2020-10-09 VITALS — BP 142/90 | HR 85 | Temp 98.0°F | Ht 73.0 in | Wt 218.0 lb

## 2020-10-09 DIAGNOSIS — E782 Mixed hyperlipidemia: Secondary | ICD-10-CM

## 2020-10-09 DIAGNOSIS — I82511 Chronic embolism and thrombosis of right femoral vein: Secondary | ICD-10-CM | POA: Diagnosis not present

## 2020-10-09 DIAGNOSIS — Z0000 Encounter for general adult medical examination without abnormal findings: Secondary | ICD-10-CM | POA: Diagnosis not present

## 2020-10-09 DIAGNOSIS — E119 Type 2 diabetes mellitus without complications: Secondary | ICD-10-CM

## 2020-10-09 DIAGNOSIS — Z8546 Personal history of malignant neoplasm of prostate: Secondary | ICD-10-CM

## 2020-10-09 DIAGNOSIS — I10 Essential (primary) hypertension: Secondary | ICD-10-CM

## 2020-10-09 DIAGNOSIS — D6869 Other thrombophilia: Secondary | ICD-10-CM

## 2020-10-09 LAB — CBC WITH DIFFERENTIAL/PLATELET
Basophils Absolute: 0 10*3/uL (ref 0.0–0.1)
Basophils Relative: 0.7 % (ref 0.0–3.0)
Eosinophils Absolute: 0.2 10*3/uL (ref 0.0–0.7)
Eosinophils Relative: 3.4 % (ref 0.0–5.0)
HCT: 42.8 % (ref 39.0–52.0)
Hemoglobin: 14.4 g/dL (ref 13.0–17.0)
Lymphocytes Relative: 35.2 % (ref 12.0–46.0)
Lymphs Abs: 1.7 10*3/uL (ref 0.7–4.0)
MCHC: 33.7 g/dL (ref 30.0–36.0)
MCV: 95.1 fl (ref 78.0–100.0)
Monocytes Absolute: 0.6 10*3/uL (ref 0.1–1.0)
Monocytes Relative: 12.1 % — ABNORMAL HIGH (ref 3.0–12.0)
Neutro Abs: 2.4 10*3/uL (ref 1.4–7.7)
Neutrophils Relative %: 48.6 % (ref 43.0–77.0)
Platelets: 225 10*3/uL (ref 150.0–400.0)
RBC: 4.49 Mil/uL (ref 4.22–5.81)
RDW: 13.3 % (ref 11.5–15.5)
WBC: 4.8 10*3/uL (ref 4.0–10.5)

## 2020-10-09 LAB — COMPREHENSIVE METABOLIC PANEL
ALT: 19 U/L (ref 0–53)
AST: 20 U/L (ref 0–37)
Albumin: 4.3 g/dL (ref 3.5–5.2)
Alkaline Phosphatase: 85 U/L (ref 39–117)
BUN: 26 mg/dL — ABNORMAL HIGH (ref 6–23)
CO2: 25 mEq/L (ref 19–32)
Calcium: 9.4 mg/dL (ref 8.4–10.5)
Chloride: 107 mEq/L (ref 96–112)
Creatinine, Ser: 1.43 mg/dL (ref 0.40–1.50)
GFR: 48.7 mL/min — ABNORMAL LOW (ref >60.00)
Glucose, Bld: 136 mg/dL — ABNORMAL HIGH (ref 70–99)
Potassium: 4.2 mEq/L (ref 3.5–5.1)
Sodium: 140 mEq/L (ref 135–145)
Total Bilirubin: 0.5 mg/dL (ref 0.2–1.2)
Total Protein: 7.3 g/dL (ref 6.0–8.3)

## 2020-10-09 LAB — LIPID PANEL
Cholesterol: 139 mg/dL (ref 0–200)
HDL: 42.4 mg/dL (ref >39.00)
LDL Cholesterol: 88 mg/dL (ref 0–99)
NonHDL: 96.39
Total CHOL/HDL Ratio: 3
Triglycerides: 44 mg/dL (ref 0.0–149.0)
VLDL: 8.8 mg/dL (ref 0.0–40.0)

## 2020-10-09 LAB — TSH: TSH: 0.83 u[IU]/mL (ref 0.35–4.50)

## 2020-10-09 LAB — HEMOGLOBIN A1C: Hgb A1c MFr Bld: 7.2 % — ABNORMAL HIGH (ref 4.6–6.5)

## 2020-10-09 MED ORDER — LOSARTAN POTASSIUM 50 MG PO TABS
50.0000 mg | ORAL_TABLET | Freq: Every day | ORAL | 3 refills | Status: DC
Start: 2020-10-09 — End: 2021-04-02

## 2020-10-09 MED ORDER — APIXABAN 5 MG PO TABS
5.0000 mg | ORAL_TABLET | Freq: Two times a day (BID) | ORAL | 2 refills | Status: DC
Start: 2020-10-09 — End: 2021-07-05

## 2020-10-09 MED ORDER — DAPAGLIFLOZIN PROPANEDIOL 5 MG PO TABS
5.0000 mg | ORAL_TABLET | Freq: Every day | ORAL | 5 refills | Status: DC
Start: 2020-10-09 — End: 2020-10-10

## 2020-10-09 NOTE — Assessment & Plan Note (Signed)
Chronic Not taking [place] or farxiga ( not refilled) Check a1c Restart farxiga 5 mg daily - will increase in near future, will d/c [place] for now Low sugar / carb diet Stressed regular exercise

## 2020-10-09 NOTE — Addendum Note (Signed)
Addended by: Cresenciano Lick on: 10/09/2020 08:30 AM   Modules accepted: Orders

## 2020-10-09 NOTE — Assessment & Plan Note (Signed)
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

## 2020-10-09 NOTE — Assessment & Plan Note (Signed)
Chronic Check lipid panel  Continue crestor 10 mg daily Regular exercise and healthy diet encouraged  

## 2020-10-09 NOTE — Assessment & Plan Note (Signed)
Chronic No evidence of recurrence

## 2020-10-09 NOTE — Assessment & Plan Note (Signed)
Chronic BP controlled - advised him I do not want it to be any higher Continue losartan 50 mg daily, metoprolol XL 50 mg daily, amlodipine 10 mg dialy  cmp

## 2020-10-10 ENCOUNTER — Ambulatory Visit: Payer: Medicare Other

## 2020-10-10 MED ORDER — DAPAGLIFLOZIN PROPANEDIOL 10 MG PO TABS: 10.0000 mg | ORAL_TABLET | Freq: Every day | ORAL | 1 refills | Status: DC

## 2020-10-10 NOTE — Addendum Note (Signed)
Addended by: Binnie Rail on: 10/10/2020 02:42 PM   Modules accepted: Orders

## 2020-10-15 ENCOUNTER — Ambulatory Visit: Payer: Medicare Other

## 2020-10-16 ENCOUNTER — Ambulatory Visit (INDEPENDENT_AMBULATORY_CARE_PROVIDER_SITE_OTHER): Payer: Medicare Other

## 2020-10-16 ENCOUNTER — Other Ambulatory Visit: Payer: Self-pay

## 2020-10-16 DIAGNOSIS — Z0000 Encounter for general adult medical examination without abnormal findings: Secondary | ICD-10-CM | POA: Diagnosis not present

## 2020-10-16 NOTE — Progress Notes (Signed)
I connected with Alfred Foster today by telephone and verified that I am speaking with the correct person using two identifiers. Location patient: home Location provider: work Persons participating in the virtual visit: Alfred Foster and [employer], LPN.   I discussed the limitations, risks, security and privacy concerns of performing an evaluation and management service by telephone and the availability of in person appointments. I also discussed with the patient that there may be a patient responsible charge related to this service. The patient expressed understanding and verbally consented to this telephonic visit.    Interactive audio and video telecommunications were attempted between this provider and patient, however failed, due to patient having technical difficulties OR patient did not have access to video capability.  We continued and completed visit with audio only.  Some vital signs may be absent or patient reported.   Time Spent with patient on telephone encounter: 30 minutes  Subjective:   Alfred Foster is a 74 y.o. male who presents for Medicare Annual/Subsequent preventive examination.  Review of Systems    No ROS. Medicare Wellness Visit. Additional risk factors are reflected in social history. Cardiac Risk Factors include: advanced age (>49men, >79 women);diabetes mellitus;dyslipidemia;hypertension;family history of premature cardiovascular disease;male gender Sleep Patterns: No sleep issues, feels rested on waking and sleeps 6-7 hours nightly. Home Safety/Smoke Alarms: Feels safe in home; uses home alarm. Smoke alarms in place. Living environment: 2-story home; Lives with spouse and son; no needs for DME; good support system. Seat Belt Safety/Bike Helmet: Wears seat belt.    Objective:    There were no vitals filed for this visit. There is no height or weight on file to calculate BMI.  Advanced Directives 10/16/2020 12/10/2019 12/09/2019 09/11/2018 09/15/2017  03/11/2014  Does Patient Have a Medical Advance Directive? No - No No No Patient would like information  Would patient like information on creating a medical advance directive? Yes (MAU/Ambulatory/Procedural Areas - Information given) No - Patient declined - No - Patient declined Yes (ED - Information included in AVS) -    Current Medications (verified) Outpatient Encounter Medications as of 10/16/2020  Medication Sig  . amLODipine (NORVASC) 10 MG tablet Take 1 tablet (10 mg total) by mouth daily.  Marland Kitchen apixaban (ELIQUIS) 5 MG TABS tablet Take 1 tablet (5 mg total) by mouth 2 (two) times daily.  . Ascorbic Acid (VITAMIN C PO) Take 1 tablet by mouth daily.   Marland Kitchen aspirin 81 MG tablet Take 81 mg by mouth daily.  . Cholecalciferol (VITAMIN D3) 1000 UNITS CAPS Take 1,000 Units by mouth daily.  . dapagliflozin propanediol (FARXIGA) 10 MG TABS tablet Take 1 tablet (10 mg total) by mouth daily before breakfast.  . 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  Dx: T73.220  . folic acid (FOLVITE) 254 MCG tablet Take 800 mcg by mouth daily.   . Ipratropium-Albuterol (COMBIVENT) 20-100 MCG/ACT AERS respimat Inhale 1 puff into the lungs every 6 (six) hours as needed for wheezing or shortness of breath.  . Lancets (ONETOUCH ULTRASOFT) lancets use as directed  . losartan (COZAAR) 50 MG tablet Take 1 tablet (50 mg total) by mouth daily.  . metoprolol succinate (TOPROL-XL) 50 MG 24 hr tablet Take 1 tablet (50 mg total) by mouth daily. Take with or immediately following a meal.  . niacin 500 MG tablet Take 500 mg by mouth daily.   . Omega-3 Fatty Acids (FISH OIL PO) Take 1,000 mg by mouth  daily.   . ONE TOUCH ULTRA TEST test strip use as directed  . rosuvastatin (CRESTOR) 10 MG tablet TAKE 1 TABLET(10 MG) BY MOUTH DAILY   No facility-administered encounter medications on file as of 10/16/2020.    Allergies (verified) Hydrochlorothiazide and Metformin and  related   History: Past Medical History:  Diagnosis Date  . Diabetes mellitus   . Hyperlipidemia   . Hypertension   . Prostate cancer [facility]) 2007   Dr Alinda Money   Past Surgical History:  Procedure Laterality Date  . COLONOSCOPY  2004 & 2014   Negative ;[facility]  . INGUINAL HERNIA REPAIR    . PROSTATECTOMY  04/2006   Dr Alinda Money  . UMBILICAL HERNIA REPAIR     Family History  Problem Relation Age of Onset  . Diabetes Father   . Hypertension Father   . Alzheimer's disease Father   . Hypertension Mother   . Alzheimer's disease Mother   . Heart attack Mother 76  . Diabetes Brother   . Heart disease Brother   . Heart attack Maternal Aunt        in 10s  . Stroke Neg Hx    Social History   Socioeconomic History  . Marital status: Married    Spouse name: Not on file  . Number of children: Not on file  . Years of education: Not on file  . Highest education level: Not on file  Occupational History  . Not on file  Tobacco Use  . Smoking status: Never Smoker  . Smokeless tobacco: Never Used  Substance and Sexual Activity  . Alcohol use: No  . Drug use: No  . Sexual activity: Not on file  Other Topics Concern  . Not on file  Social History Narrative  . Not on file   Social Determinants of Health   Financial Resource Strain: Low Risk   . Difficulty of Paying Living Expenses: Not hard at all  Food Insecurity: No Food Insecurity  . Worried About Charity fundraiser in the Last Year: Never true  . Ran Out of Food in the Last Year: Never true  Transportation Needs: No Transportation Needs  . Lack of Transportation (Medical): No  . Lack of Transportation (Non-Medical): No  Physical Activity: Sufficiently Active  . Days of Exercise per Week: 5 days  . Minutes of Exercise per Session: 30 min  Stress: No Stress Concern Present  . Feeling of Stress : Not at all  Social Connections: Socially Integrated  . Frequency of Communication with Friends and Family: More than three  times a week  . Frequency of Social Gatherings with Friends and Family: Once a week  . Attends Religious Services: More than 4 times per year  . Active Member of Clubs or Organizations: Yes  . Attends Archivist Meetings: More than 4 times per year  . Marital Status: Married    Tobacco Counseling Counseling given: Not Answered   Clinical Intake:  Pre-visit preparation completed: Yes  Pain : No/denies pain     Nutritional Risks: None Diabetes: Yes CBG done?: No Did pt. bring in CBG monitor from home?: No  How often do you need to have someone help you when you read instructions, pamphlets, or other written materials from your doctor or pharmacy?: 1 - Never What is the last grade level you completed in school?: HSG  Diabetic? yes  Interpreter Needed?: No  Information entered by :: Lisette Abu, LPN   Activities of Daily Living In  your present state of health, do you have any difficulty performing the following activities: 10/16/2020 12/10/2019  Hearing? N N  Vision? N N  Difficulty concentrating or making decisions? N N  Walking or climbing stairs? N N  Dressing or bathing? N N  Doing errands, shopping? N N  Preparing Food and eating ? N -  Using the Toilet? N -  In the past six months, have you accidently leaked urine? N -  Do you have problems with loss of bowel control? N -  Managing your Medications? N -  Managing your Finances? N -  Housekeeping or managing your Housekeeping? N -  Some recent data might be hidden    Patient Care Team: Binnie Rail, MD as PCP - General (Internal Medicine) Charlton Haws, [facility] as Pharmacist (Pharmacist)  Indicate any recent Medical Services you may have received from other than [facility] providers in the past year (date may be approximate).     Assessment:   This is a routine wellness examination for [place].  Hearing/Vision screen No exam data present  Dietary issues and exercise activities  discussed: Current Exercise Habits: Home exercise routine, Type of exercise: strength training/weights;stretching;treadmill;walking, Time (Minutes): 30, Frequency (Times/Week): 5, Weekly Exercise (Minutes/Week): 150, Intensity: Moderate, Exercise limited by: None identified  Goals    . maintain current health status     Continue to eat healthy and exercise, enjoy life and family.    . Patient Stated     My goal is to keep my weight in between 200-220 pounds, stay physically and socially active.      Depression Screen PHQ 2/9 Scores 10/09/2020 10/04/2019 09/19/2018 09/15/2017 04/04/2017 03/19/2015 08/28/2013  PHQ - 2 Score 0 0 0 0 0 0 0  PHQ- 9 Score 0 - 0 0 - - -    Fall Risk Fall Risk  10/16/2020 03/20/2020 01/10/2020 09/19/2018 09/15/2017  Falls in the past year? 0 0 0 No No  Number falls in past yr: 0 0 0 - -  Injury with Fall? 0 0 - - -  Risk for fall due to : No Fall Risks - - - -  Follow up Falls evaluation completed;Education provided Falls evaluation completed - - -    Any stairs in or around the home? Yes  If so, are there any without handrails? No  Home free of loose throw rugs in walkways, pet beds, electrical cords, etc? Yes  Adequate lighting in your home to reduce risk of falls? Yes   ASSISTIVE DEVICES UTILIZED TO PREVENT FALLS:  Life alert? No  Use of a cane, walker or w/c? No  Grab bars in the bathroom? Yes  Shower chair or bench in shower? Yes  Elevated toilet seat or a handicapped toilet? Yes   TIMED UP AND GO:  Was the test performed? No .  Length of time to ambulate 10 feet: 0 sec.   Gait steady and fast without use of assistive device  Cognitive Function: Patient is cogitatively intact.        Immunizations Immunization History  Administered Date(s) Administered  . Fluad Quad(high Dose 65+) 09/06/2019, 09/26/2020  . Influenza Split 10/12/2011, 08/24/2012  . Influenza Whole 09/25/2007, 09/30/2008, 09/09/2009, 09/07/2010  . Influenza, High Dose Seasonal  PF 09/03/2014, 08/18/2016, 09/15/2017, 09/19/2018  . Influenza,inj,Quad PF,6+ Mos 09/17/2013, 09/07/2015  . PFIZER SARS-COV-2 Vaccination 03/13/2020, 04/06/2020, 09/22/2020  . Pneumococcal Conjugate-13 10/28/2014  . Pneumococcal Polysaccharide-23 11/12/2012  . Td 04/21/2010  . Zoster 11/23/2012  . Zoster Recombinat (  Shingrix) 06/09/2017, 08/15/2017    TDAP status: Due, Education has been provided regarding the importance of this vaccine. Advised may receive this vaccine at local pharmacy or Health Dept. Aware to provide a copy of the vaccination record if obtained from local pharmacy or Health Dept. Verbalized acceptance and understanding. Flu Vaccine status: Up to date Pneumococcal vaccine status: Up to date Covid-19 vaccine status: Completed vaccines  Qualifies for Shingles Vaccine? Yes   Zostavax completed Yes   Shingrix Completed?: Yes  Screening Tests Health Maintenance  Topic Date Due  . OPHTHALMOLOGY EXAM  08/15/2020  . TETANUS/TDAP  10/09/2021 (Originally 04/21/2020)  . HEMOGLOBIN A1C  04/09/2021  . FOOT EXAM  05/26/2021  . COLONOSCOPY  06/25/2023  . INFLUENZA VACCINE  Completed  . COVID-19 Vaccine  Completed  . Hepatitis C Screening  Completed  . PNA vac Low Risk Adult  Completed    Health Maintenance  Health Maintenance Due  Topic Date Due  . OPHTHALMOLOGY EXAM  08/15/2020    Colorectal cancer screening: Completed 06/24/2013. Repeat every 10 years  Lung Cancer Screening: (Low Dose CT Chest recommended if Age 22-80 years, 30 pack-year currently smoking OR have quit w/in 15years.) does not qualify.   Lung Cancer Screening Referral: no  Additional Screening:  Hepatitis C Screening: does qualify; Completed : yes  Vision Screening: Recommended annual ophthalmology exams for early detection of glaucoma and other disorders of the eye. Is the patient up to date with their annual eye exam?  Yes  Who is the provider or what is the name of the office in which the  patient attends annual eye exams? [facility]  If pt is not established with a provider, would they like to be referred to a provider to establish care? No .   Dental Screening: Recommended annual dental exams for proper oral hygiene  Community Resource Referral / Chronic Care Management: CRR required this visit?  No   CCM required this visit?  No      Plan:     I have personally reviewed and noted the following in the patient's chart:   . Medical and social history . Use of alcohol, tobacco or illicit drugs  . Current medications and supplements . Functional ability and status . Nutritional status . Physical activity . Advanced directives . List of other physicians . Hospitalizations, surgeries, and ER visits in previous 12 months . Vitals . Screenings to include cognitive, depression, and falls . Referrals and appointments  In addition, I have reviewed and discussed with patient certain preventive protocols, quality metrics, and best practice recommendations. A written personalized care plan for preventive services as well as general preventive health recommendations were provided to patient.     Sheral Flow, LPN   24/07/2499   Nurse Notes:  Patient is cogitatively intact. There were no vitals filed for this visit. There is no height or weight on file to calculate BMI. Patient stated that he has no issues with gait or balance; does not use any assistive devices.

## 2020-10-16 NOTE — Patient Instructions (Addendum)
Alfred Foster , Thank you for taking time to come for your Medicare Wellness Visit. I appreciate your ongoing commitment to your health goals. Please review the following plan we discussed and let me know if I can assist you in the future.   Screening recommendations/referrals: Colonoscopy: 06/24/2013; due every 10 years Recommended yearly ophthalmology/optometry visit for glaucoma screening and checkup Recommended yearly dental visit for hygiene and checkup  Vaccinations: Influenza vaccine: 09/26/2020 Pneumococcal vaccine: up to date Tdap vaccine: 04/21/2010; due every 10 years (check with local pharmacy) Shingles vaccine: up to date   Covid-19: up to date  Advanced directives: Advance directive discussed with you today. I have provided a copy for you to complete at home and have notarized. Once this is complete please bring a copy in to our office so we can scan it into your chart.  Conditions/risks identified: Yes; Reviewed health maintenance screenings with patient today and relevant education, vaccines, and/or referrals were provided. Please continue to do your personal lifestyle choices by: daily care of teeth and gums, regular physical activity (goal should be 5 days a week for 30 minutes), eat a healthy diet, avoid tobacco and drug use, limiting any alcohol intake, taking a low-dose aspirin (if not allergic or have been advised by your provider otherwise) and taking vitamins and minerals as recommended by your provider. Continue doing brain stimulating activities (puzzles, reading, adult coloring books, staying active) to keep memory sharp. Continue to eat heart healthy diet (full of fruits, vegetables, whole grains, lean protein, water--limit salt, fat, and sugar intake) and increase physical activity as tolerated.  Next appointment: Please schedule your next Medicare Wellness Visit with your Nurse Health Advisor in 1 year by calling [phone].  Preventive Care 73 Years and Older,  Male Preventive care refers to lifestyle choices and visits with your health care provider that can promote health and wellness. What does preventive care include?  A yearly physical exam. This is also called an annual well check.  Dental exams once or twice a year.  Routine eye exams. Ask your health care provider how often you should have your eyes checked.  Personal lifestyle choices, including:  Daily care of your teeth and gums.  Regular physical activity.  Eating a healthy diet.  Avoiding tobacco and drug use.  Limiting alcohol use.  Practicing safe sex.  Taking low doses of aspirin every day.  Taking vitamin and mineral supplements as recommended by your health care provider. What happens during an annual well check? The services and screenings done by your health care provider during your annual well check will depend on your age, overall health, lifestyle risk factors, and family history of disease. Counseling  Your health care provider may ask you questions about your:  Alcohol use.  Tobacco use.  Drug use.  Emotional well-being.  Home and relationship well-being.  Sexual activity.  Eating habits.  History of falls.  Memory and ability to understand (cognition).  Work and work Statistician. Screening  You may have the following tests or measurements:  Height, weight, and BMI.  Blood pressure.  Lipid and cholesterol levels. These may be checked every 5 years, or more frequently if you are over 62 years old.  Skin check.  Lung cancer screening. You may have this screening every year starting at age 43 if you have a 30-pack-year history of smoking and currently smoke or have quit within the past 15 years.  Fecal occult blood test (FOBT) of the stool. You may have this test  every year starting at age 78.  Flexible sigmoidoscopy or colonoscopy. You may have a sigmoidoscopy every 5 years or a colonoscopy every 10 years starting at age  15.  Prostate cancer screening. Recommendations will vary depending on your family history and other risks.  Hepatitis C blood test.  Hepatitis B blood test.  Sexually transmitted disease (STD) testing.  Diabetes screening. This is done by checking your blood sugar (glucose) after you have not eaten for a while (fasting). You may have this done every 1-3 years.  Abdominal aortic aneurysm (AAA) screening. You may need this if you are a current or former smoker.  Osteoporosis. You may be screened starting at age 29 if you are at high risk. Talk with your health care provider about your test results, treatment options, and if necessary, the need for more tests. Vaccines  Your health care provider may recommend certain vaccines, such as:  Influenza vaccine. This is recommended every year.  Tetanus, diphtheria, and acellular pertussis (Tdap, Td) vaccine. You may need a Td booster every 10 years.  Zoster vaccine. You may need this after age 81.  Pneumococcal 13-valent conjugate (PCV13) vaccine. One dose is recommended after age 4.  Pneumococcal polysaccharide (PPSV23) vaccine. One dose is recommended after age 21. Talk to your health care provider about which screenings and vaccines you need and how often you need them. This information is not intended to replace advice given to you by your health care provider. Make sure you discuss any questions you have with your health care provider. Document Released: 12/25/2015 Document Revised: 08/17/2016 Document Reviewed: 09/29/2015 Elsevier Interactive Patient Education  2017 [place] Prevention in the Home Falls can cause injuries. They can happen to people of all ages. There are many things you can do to make your home safe and to help prevent falls. What can I do on the outside of my home?  Regularly fix the edges of walkways and driveways and fix any cracks.  Remove anything that might make you trip as you walk through a  door, such as a raised step or threshold.  Trim any bushes or trees on the path to your home.  Use bright outdoor lighting.  Clear any walking paths of anything that might make someone trip, such as rocks or tools.  Regularly check to see if handrails are loose or broken. Make sure that both sides of any steps have handrails.  Any raised decks and porches should have guardrails on the edges.  Have any leaves, snow, or ice cleared regularly.  Use sand or salt on walking paths during winter.  Clean up any spills in your garage right away. This includes oil or grease spills. What can I do in the bathroom?  Use night lights.  Install grab bars by the toilet and in the tub and shower. Do not use towel bars as grab bars.  Use non-skid mats or decals in the tub or shower.  If you need to sit down in the shower, use a plastic, non-slip stool.  Keep the floor dry. Clean up any water that spills on the floor as soon as it happens.  Remove soap buildup in the tub or shower regularly.  Attach bath mats securely with double-sided non-slip rug tape.  Do not have throw rugs and other things on the floor that can make you trip. What can I do in the bedroom?  Use night lights.  Make sure that you have a light by your bed  that is easy to reach.  Do not use any sheets or blankets that are too big for your bed. They should not hang down onto the floor.  Have a firm chair that has side arms. You can use this for support while you get dressed.  Do not have throw rugs and other things on the floor that can make you trip. What can I do in the kitchen?  Clean up any spills right away.  Avoid walking on wet floors.  Keep items that you use a lot in easy-to-reach places.  If you need to reach something above you, use a strong step stool that has a grab bar.  Keep electrical cords out of the way.  Do not use floor polish or wax that makes floors slippery. If you must use wax, use  non-skid floor wax.  Do not have throw rugs and other things on the floor that can make you trip. What can I do with my stairs?  Do not leave any items on the stairs.  Make sure that there are handrails on both sides of the stairs and use them. Fix handrails that are broken or loose. Make sure that handrails are as long as the stairways.  Check any carpeting to make sure that it is firmly attached to the stairs. Fix any carpet that is loose or worn.  Avoid having throw rugs at the top or bottom of the stairs. If you do have throw rugs, attach them to the floor with carpet tape.  Make sure that you have a light switch at the top of the stairs and the bottom of the stairs. If you do not have them, ask someone to add them for you. What else can I do to help prevent falls?  Wear shoes that:  Do not have high heels.  Have rubber bottoms.  Are comfortable and fit you well.  Are closed at the toe. Do not wear sandals.  If you use a stepladder:  Make sure that it is fully opened. Do not climb a closed stepladder.  Make sure that both sides of the stepladder are locked into place.  Ask someone to hold it for you, if possible.  Clearly mark and make sure that you can see:  Any grab bars or handrails.  First and last steps.  Where the edge of each step is.  Use tools that help you move around (mobility aids) if they are needed. These include:  Canes.  Walkers.  Scooters.  Crutches.  Turn on the lights when you go into a dark area. Replace any light bulbs as soon as they burn out.  Set up your furniture so you have a clear path. Avoid moving your furniture around.  If any of your floors are uneven, fix them.  If there are any pets around you, be aware of where they are.  Review your medicines with your doctor. Some medicines can make you feel dizzy. This can increase your chance of falling. Ask your doctor what other things that you can do to help prevent falls. This  information is not intended to replace advice given to you by your health care provider. Make sure you discuss any questions you have with your health care provider. Document Released: 09/24/2009 Document Revised: 05/05/2016 Document Reviewed: 01/02/2015 Elsevier Interactive Patient Education  2017 [employer].

## 2020-10-26 ENCOUNTER — Other Ambulatory Visit: Payer: Self-pay | Admitting: Internal Medicine

## 2020-12-02 DIAGNOSIS — H524 Presbyopia: Secondary | ICD-10-CM | POA: Diagnosis not present

## 2020-12-02 DIAGNOSIS — H25011 Cortical age-related cataract, right eye: Secondary | ICD-10-CM | POA: Diagnosis not present

## 2020-12-02 DIAGNOSIS — H5203 Hypermetropia, bilateral: Secondary | ICD-10-CM | POA: Diagnosis not present

## 2020-12-02 DIAGNOSIS — E119 Type 2 diabetes mellitus without complications: Secondary | ICD-10-CM | POA: Diagnosis not present

## 2020-12-02 LAB — HM DIABETES EYE EXAM

## 2021-01-08 ENCOUNTER — Encounter: Payer: Self-pay | Admitting: Internal Medicine

## 2021-01-08 NOTE — Progress Notes (Signed)
Outside notes received. Information abstracted. Notes sent to scan.  

## 2021-01-18 IMAGING — DX DG PELVIS 1-2V
1 series · 1 of 1 positions shown · non-contrast
Comparison: None.

CLINICAL DATA: Bursitis

EXAM:
PELVIS - 1-2 VIEW

[pelvis ap]
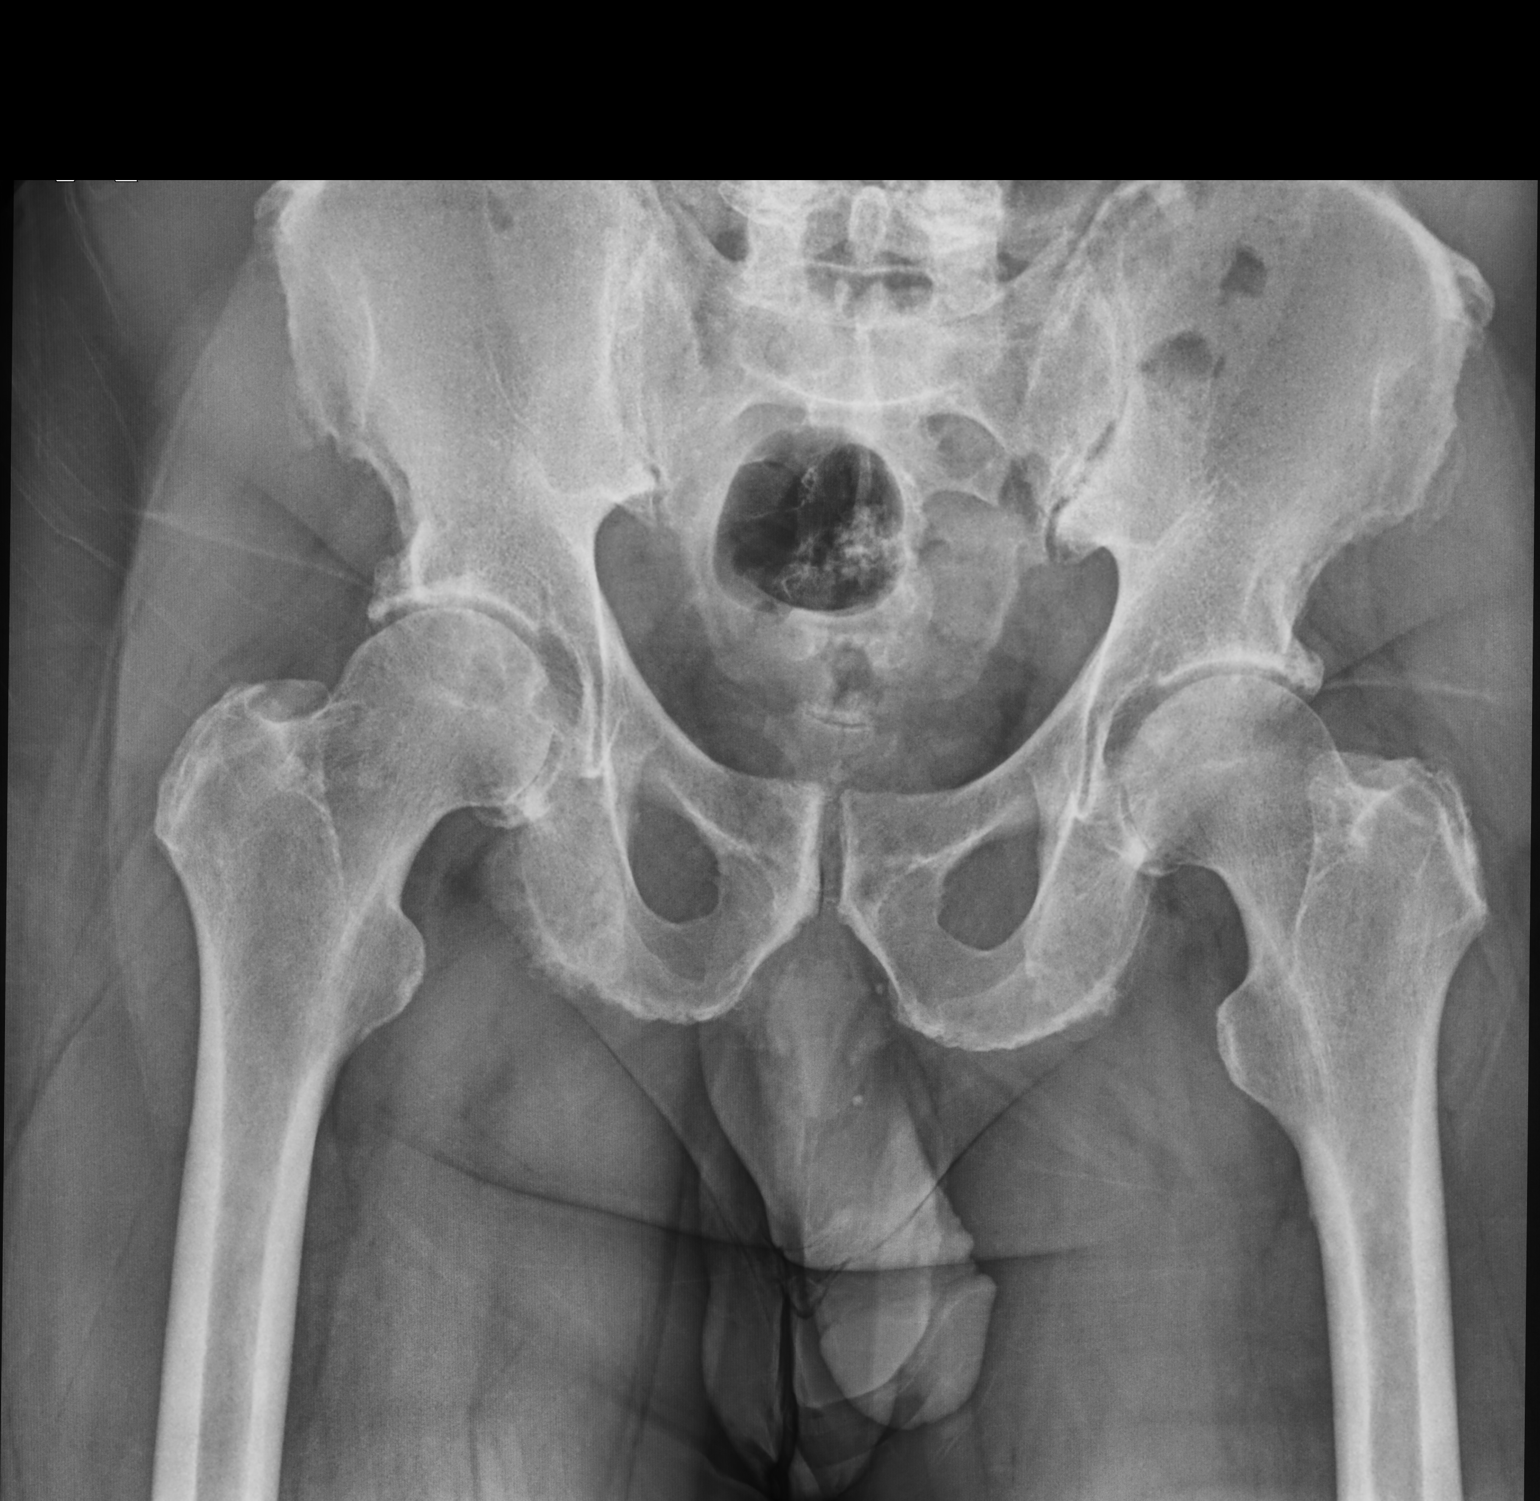

[1 of 1 positions shown; findings below may reference images not displayed]

FINDINGS: Osseous alignment is normal. Bone mineralization is normal. No acute
or suspicious osseous lesion.

Degenerative osteoarthritic changes at both hip joints, mild to
moderate in degree with associated joint space narrowing and mild
osseous spurring. Soft tissues about the pelvis are unremarkable.
IMPRESSION: 1. No acute findings.
2. Degenerative OA at both hip joints, mild to moderate in degree.

## 2021-02-02 ENCOUNTER — Ambulatory Visit: Payer: Medicare Other | Admitting: Orthotics

## 2021-02-02 ENCOUNTER — Ambulatory Visit (INDEPENDENT_AMBULATORY_CARE_PROVIDER_SITE_OTHER): Payer: Medicare Other | Admitting: Podiatry

## 2021-02-02 ENCOUNTER — Encounter: Payer: Self-pay | Admitting: Podiatry

## 2021-02-02 ENCOUNTER — Other Ambulatory Visit: Payer: Self-pay

## 2021-02-02 DIAGNOSIS — L84 Corns and callosities: Secondary | ICD-10-CM

## 2021-02-02 DIAGNOSIS — E119 Type 2 diabetes mellitus without complications: Secondary | ICD-10-CM | POA: Diagnosis not present

## 2021-02-02 DIAGNOSIS — M79675 Pain in left toe(s): Secondary | ICD-10-CM | POA: Diagnosis not present

## 2021-02-02 DIAGNOSIS — M79674 Pain in right toe(s): Secondary | ICD-10-CM | POA: Diagnosis not present

## 2021-02-02 DIAGNOSIS — B351 Tinea unguium: Secondary | ICD-10-CM | POA: Diagnosis not present

## 2021-02-02 NOTE — Progress Notes (Signed)

## 2021-02-06 NOTE — Progress Notes (Signed)
Subjective: Alfred Foster presents today for follow up of preventative diabetic foot care and painful mycotic nails b/l that are difficult to trim. Pain interferes with ambulation. Aggravating factors include wearing enclosed shoe gear. Pain is relieved with periodic professional debridement.   He voices no new pedal problems on today's visit. PCP is Dr. Billey Foster. Last visit was 10/09/2020.  He voices no new pedal problems on today's visit.  Allergies  Allergen Reactions  . Hydrochlorothiazide     SOB, dizzy  . Metformin And Related Other (See Comments)    Make him feel full and uncomfortable     Objective: There were no vitals filed for this visit.  Vascular Examination:  Capillary refill time to digits immediate b/l, palpable DP pulses b/l, palpable PT pulses b/l, pedal hair absent b/l and skin temperature gradient within normal limits b/l  Dermatological Examination: Pedal skin with normal turgor, texture and tone bilaterally and toenails 1-5 b/l elongated, dystrophic, thickened, crumbly with subungual debris  Musculoskeletal: Normal muscle strength 5/5 to all lower extremity muscle groups bilaterally, no pain crepitus or joint limitation noted with ROM b/l, bunion deformity noted b/l and pes planus deformity noted  Neurological: Protective sensation intact 5/5 intact bilaterally with 10g monofilament b/l and vibratory sensation intact b/l  Assessment: 1. Pain due to onychomycosis of toenails of both feet   2. Controlled type 2 diabetes mellitus without complication, without long-term current use of insulin ([name])     Plan: -Continue diabetic foot care principles.  -No new findings. No new orders. -Toenails 1-5 b/l were debrided in length and girth with sterile nail nippers and dremel without iatrogenic bleeding. -Patient to continue soft, supportive shoe gear daily. -Patient/POA to call should there be question/concern in the interim.  Return in about 3 months (around  05/02/2021).

## 2021-04-01 NOTE — Progress Notes (Signed)
Subjective:    Patient ID: Alfred Foster, male    DOB: 1946-03-15, 75 y.o.   MRN: [id number]  HPI The patient is here for follow up of their chronic medical problems, including htn, DM, hyperlipidemia  He is taking all of his medications as prescribed.  He feels he is eating pretty well, but does cheat in a couple weeks ago had ice cream.  He does not do good with limiting carbohydrates.   His left side hurts when he he gets out of bed in the morning and he will feel it for about 12 min and then it goes away.  He does not feel it during the day.  No pain with walking.    Medications and allergies reviewed with patient and updated if appropriate.  Patient Active Problem List   Diagnosis Date Noted  . Secondary hypercoagulable state ([place]) 10/08/2020  . Ischial bursitis of right side 04/10/2020  . Lumbar radiculopathy, acute 02/28/2020  . Side pain 09/06/2019  . 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 Carotid artery disease ([place]) 10/21/2017  . Right carotid bruit 09/15/2017  . Lipoma of skin of abdomen 03/19/2015  . Nonspecific abnormal electrocardiogram (ECG) (EKG) 03/15/2012  . Essential hypertension 10/22/2010  . PROSTATE CANCER, HX OF 01/23/2009  . Diabetes mellitus ([place]) 08/09/2007  . Hyperlipidemia 05/08/2007    Current Outpatient Medications on File Prior to Visit  Medication Sig Dispense Refill  . amLODipine (NORVASC) 10 MG tablet Take 1 tablet (10 mg total) by mouth daily. 90 tablet 3  . apixaban (ELIQUIS) 5 MG TABS tablet Take 1 tablet (5 mg total) by mouth 2 (two) times daily. 180 tablet 2  . Ascorbic Acid (VITAMIN C PO) Take 1 tablet by mouth daily.    Marland Kitchen aspirin 81 MG tablet Take 81 mg by mouth daily.    . Cholecalciferol (VITAMIN D3) 1000 UNITS CAPS Take 1,000 Units by mouth daily.    . dapagliflozin propanediol (FARXIGA) 10 MG TABS tablet Take 1 tablet (10 mg total) by mouth daily before breakfast. 90 tablet 1  . Elastic Bandages & Supports  (MEDICAL COMPRESSION SOCKS) MISC Use daily for leg swelling from DVT.  Dx: M19.622 2 each 0  . folic acid (FOLVITE) 297 MCG tablet Take 800 mcg by mouth daily.    . Ipratropium-Albuterol (COMBIVENT) 20-100 MCG/ACT AERS respimat Inhale 1 puff into the lungs every 6 (six) hours as needed for wheezing or shortness of breath. 4 g 0  . Lancets (ONETOUCH ULTRASOFT) lancets use as directed 100 each 1  . metoprolol succinate (TOPROL-XL) 50 MG 24 hr tablet Take 1 tablet (50 mg total) by mouth daily. Take with or immediately following a meal. 30 tablet 0  . niacin 500 MG tablet Take 500 mg by mouth daily.     . Omega-3 Fatty Acids (FISH OIL PO) Take 1,000 mg by mouth daily.    . ONE TOUCH ULTRA TEST test strip use as directed 100 each 1  . rosuvastatin (CRESTOR) 10 MG tablet TAKE 1 TABLET(10 MG) BY MOUTH DAILY 90 tablet 1   No current facility-administered medications on file prior to visit.    Past Medical History:  Diagnosis Date  . Diabetes mellitus   . Hyperlipidemia   . Hypertension   . Prostate cancer [facility]) 2007   Dr Alinda Money    Past Surgical History:  Procedure Laterality Date  . COLONOSCOPY  2004 & 2014   Negative ;[facility]  . INGUINAL  HERNIA REPAIR    . PROSTATECTOMY  04/2006   Dr Alinda Money  . UMBILICAL HERNIA REPAIR      Social History   Socioeconomic History  . Marital status: Married    Spouse name: Not on file  . Number of children: Not on file  . Years of education: Not on file  . Highest education level: Not on file  Occupational History  . Not on file  Tobacco Use  . Smoking status: Never Smoker  . Smokeless tobacco: Never Used  Substance and Sexual Activity  . Alcohol use: No  . Drug use: No  . Sexual activity: Not on file  Other Topics Concern  . Not on file  Social History Narrative  . Not on file   Social Determinants of Health   Financial Resource Strain: Low Risk   . Difficulty of Paying Living Expenses: Not hard at all  Food Insecurity: No Food  Insecurity  . Worried About Charity fundraiser in the Last Year: Never true  . Ran Out of Food in the Last Year: Never true  Transportation Needs: No Transportation Needs  . Lack of Transportation (Medical): No  . Lack of Transportation (Non-Medical): No  Physical Activity: Sufficiently Active  . Days of Exercise per Week: 5 days  . Minutes of Exercise per Session: 30 min  Stress: No Stress Concern Present  . Feeling of Stress : Not at all  Social Connections: Socially Integrated  . Frequency of Communication with Friends and Family: More than three times a week  . Frequency of Social Gatherings with Friends and Family: Once a week  . Attends Religious Services: More than 4 times per year  . Active Member of Clubs or Organizations: Yes  . Attends Archivist Meetings: More than 4 times per year  . Marital Status: Married    Family History  Problem Relation Age of Onset  . Diabetes Father   . Hypertension Father   . Alzheimer's disease Father   . Hypertension Mother   . Alzheimer's disease Mother   . Heart attack Mother 59  . Diabetes Brother   . Heart disease Brother   . Heart attack Maternal Aunt        in 23s  . Stroke Neg Hx     Review of Systems  Constitutional: Negative for chills and fever.  Respiratory: Positive for shortness of breath (rare). Negative for cough and wheezing.   Cardiovascular: Negative for chest pain, palpitations and leg swelling.  Neurological: Negative for light-headedness and headaches.       Objective:   Vitals:   04/02/21 1253 04/02/21 1629  BP: (!) 164/98 (!) 142/90  Pulse: 86   Temp: 98 F (36.7 C)   SpO2: 96%    BP Readings from Last 3 Encounters:  04/02/21 (!) 142/90  10/09/20 (!) 142/90  05/08/20 140/90   Wt Readings from Last 3 Encounters:  04/02/21 219 lb (99.3 kg)  10/09/20 218 lb (98.9 kg)  05/08/20 215 lb 3.2 oz (97.6 kg)   Body mass index is 28.89 kg/m.   Physical Exam    Constitutional: Appears  well-developed and well-nourished. No distress.  HENT:  Head: Normocephalic and atraumatic.  Neck: Neck supple. No tracheal deviation present. No thyromegaly present.  No cervical lymphadenopathy Cardiovascular: Normal rate, regular rhythm and normal heart sounds.   No murmur heard. No carotid bruit .  No edema Pulmonary/Chest: Effort normal and breath sounds normal. No respiratory distress. No has no wheezes.  No rales.  Skin: Skin is warm and dry. Not diaphoretic.  Psychiatric: Normal mood and affect. Behavior is normal.      Assessment & Plan:    See Problem List for Assessment and Plan of chronic medical problems.    This visit occurred during the SARS-CoV-2 public health emergency.  Safety protocols were in place, including screening questions prior to the visit, additional usage of staff PPE, and extensive cleaning of exam room while observing appropriate contact time as indicated for disinfecting solutions.

## 2021-04-01 NOTE — Patient Instructions (Addendum)
Monitor your BP   Blood work was ordered.     Medications changes include :   none  Your prescription(s) have been submitted to your pharmacy. Please take as directed and contact our office if you believe you are having problem(s) with the medication(s).  .    Please followup in 2 months

## 2021-04-02 ENCOUNTER — Encounter: Payer: Self-pay | Admitting: Internal Medicine

## 2021-04-02 ENCOUNTER — Other Ambulatory Visit: Payer: Self-pay

## 2021-04-02 ENCOUNTER — Ambulatory Visit (INDEPENDENT_AMBULATORY_CARE_PROVIDER_SITE_OTHER): Payer: Medicare Other | Admitting: Internal Medicine

## 2021-04-02 VITALS — BP 142/90 | HR 86 | Temp 98.0°F | Ht 73.0 in | Wt 219.0 lb

## 2021-04-02 DIAGNOSIS — I10 Essential (primary) hypertension: Secondary | ICD-10-CM

## 2021-04-02 DIAGNOSIS — E119 Type 2 diabetes mellitus without complications: Secondary | ICD-10-CM | POA: Diagnosis not present

## 2021-04-02 DIAGNOSIS — M7071 Other bursitis of hip, right hip: Secondary | ICD-10-CM

## 2021-04-02 DIAGNOSIS — E782 Mixed hyperlipidemia: Secondary | ICD-10-CM

## 2021-04-02 LAB — COMPREHENSIVE METABOLIC PANEL
ALT: 23 U/L (ref 0–53)
AST: 21 U/L (ref 0–37)
Albumin: 4.3 g/dL (ref 3.5–5.2)
Alkaline Phosphatase: 93 U/L (ref 39–117)
BUN: 25 mg/dL — ABNORMAL HIGH (ref 6–23)
CO2: 25 mEq/L (ref 19–32)
Calcium: 9.7 mg/dL (ref 8.4–10.5)
Chloride: 106 mEq/L (ref 96–112)
Creatinine, Ser: 1.45 mg/dL (ref 0.40–1.50)
GFR: 47.73 mL/min — ABNORMAL LOW (ref >60.00)
Glucose, Bld: 103 mg/dL — ABNORMAL HIGH (ref 70–99)
Potassium: 4.3 mEq/L (ref 3.5–5.1)
Sodium: 140 mEq/L (ref 135–145)
Total Bilirubin: 0.8 mg/dL (ref 0.2–1.2)
Total Protein: 7.4 g/dL (ref 6.0–8.3)

## 2021-04-02 LAB — HEMOGLOBIN A1C: Hgb A1c MFr Bld: 7.2 % — ABNORMAL HIGH (ref 4.6–6.5)

## 2021-04-02 LAB — LIPID PANEL
Cholesterol: 117 mg/dL (ref 0–200)
HDL: 39.3 mg/dL (ref >39.00)
LDL Cholesterol: 65 mg/dL (ref 0–99)
NonHDL: 77.57
Total CHOL/HDL Ratio: 3
Triglycerides: 61 mg/dL (ref 0.0–149.0)
VLDL: 12.2 mg/dL (ref 0.0–40.0)

## 2021-04-02 MED ORDER — LOSARTAN POTASSIUM 50 MG PO TABS: 50.0000 mg | ORAL_TABLET | Freq: Every day | ORAL | 3 refills | Status: DC

## 2021-04-02 NOTE — Assessment & Plan Note (Signed)
Chronic Blood pressure quite elevated initially, but did improve some on repeat-still not well controlled He has not been monitoring his BP at home-he will start monitoring his blood pressure and let me know if it is elevated, if not we will follow-up in 2 months Continue amlodipine 5 mg daily, metoprolol XL 50 mg daily, losartan 50 mg daily Stressed low-sodium diet CMP

## 2021-04-02 NOTE — Assessment & Plan Note (Signed)
Chronic Lab Results  Component Value Date   HGBA1C 7.2 (H) 10/09/2020   Sugars not ideally controlled 6 months ago A1c today Will adjust medication if needed Stressed low-carb/sugar diet and being active Continue Farxiga 10 mg daily

## 2021-04-02 NOTE — Assessment & Plan Note (Signed)
Chronic Check lipid panel  Continue rosuvastatin 10 mg daily Regular exercise and healthy diet encouraged

## 2021-04-02 NOTE — Assessment & Plan Note (Signed)
Recurrence Saw Dr Georgina Snell one year ago - resolved with exercises at home Will restart exercises  Call if no improvement

## 2021-04-06 MED ORDER — GLIMEPIRIDE 1 MG PO TABS: 1.0000 mg | ORAL_TABLET | Freq: Every day | ORAL | 5 refills | Status: DC

## 2021-04-06 NOTE — Addendum Note (Signed)
Addended by: Binnie Rail on: 04/06/2021 07:49 AM   Modules accepted: Orders

## 2021-04-09 ENCOUNTER — Ambulatory Visit: Payer: Medicare Other | Admitting: Internal Medicine

## 2021-05-03 ENCOUNTER — Telehealth: Payer: Self-pay | Admitting: Internal Medicine

## 2021-05-03 NOTE — Telephone Encounter (Signed)
Spoke with patient's wife today and info given. 

## 2021-05-03 NOTE — Telephone Encounter (Signed)
Tylenol - can take up to 3000 mg in one day

## 2021-05-03 NOTE — Telephone Encounter (Signed)
   Spouse calling for advice  Patient has back pain, what should he take OTC for pain while also taking Eliquis

## 2021-05-06 ENCOUNTER — Ambulatory Visit (INDEPENDENT_AMBULATORY_CARE_PROVIDER_SITE_OTHER): Payer: Medicare Other | Admitting: Sports Medicine

## 2021-05-06 ENCOUNTER — Encounter: Payer: Self-pay | Admitting: Sports Medicine

## 2021-05-06 ENCOUNTER — Other Ambulatory Visit: Payer: Self-pay

## 2021-05-06 DIAGNOSIS — B351 Tinea unguium: Secondary | ICD-10-CM | POA: Diagnosis not present

## 2021-05-06 DIAGNOSIS — M79674 Pain in right toe(s): Secondary | ICD-10-CM

## 2021-05-06 DIAGNOSIS — M79675 Pain in left toe(s): Secondary | ICD-10-CM | POA: Diagnosis not present

## 2021-05-06 DIAGNOSIS — E119 Type 2 diabetes mellitus without complications: Secondary | ICD-10-CM

## 2021-05-06 DIAGNOSIS — M2041 Other hammer toe(s) (acquired), right foot: Secondary | ICD-10-CM

## 2021-05-06 DIAGNOSIS — M2042 Other hammer toe(s) (acquired), left foot: Secondary | ICD-10-CM

## 2021-05-06 NOTE — Progress Notes (Signed)
Subjective: Alfred Foster is a 75 y.o. male patient with history of diabetes who returns to office today complaining of long, painful nails while ambulating in shoes; unable to trim. Patient is here for dispensing of shoes. No other issues.  Patient Active Problem List   Diagnosis Date Noted  . Secondary hypercoagulable state ([place]) 10/08/2020  . Ischial bursitis of right side 04/10/2020  . Lumbar radiculopathy, acute 02/28/2020  . 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 Carotid artery disease ([place]) 10/21/2017  . Right carotid bruit 09/15/2017  . Lipoma of skin of abdomen 03/19/2015  . Nonspecific abnormal electrocardiogram (ECG) (EKG) 03/15/2012  . Essential hypertension 10/22/2010  . PROSTATE CANCER, HX OF 01/23/2009  . Diabetes mellitus ([place]) 08/09/2007  . Hyperlipidemia 05/08/2007   Current Outpatient Medications on File Prior to Visit  Medication Sig Dispense Refill  . amLODipine (NORVASC) 10 MG tablet Take 1 tablet (10 mg total) by mouth daily. 90 tablet 3  . apixaban (ELIQUIS) 5 MG TABS tablet Take 1 tablet (5 mg total) by mouth 2 (two) times daily. 180 tablet 2  . Ascorbic Acid (VITAMIN C PO) Take 1 tablet by mouth daily.    Marland Kitchen aspirin 81 MG tablet Take 81 mg by mouth daily.    . Cholecalciferol (VITAMIN D3) 1000 UNITS CAPS Take 1,000 Units by mouth daily.    . dapagliflozin propanediol (FARXIGA) 10 MG TABS tablet Take 1 tablet (10 mg total) by mouth daily before breakfast. 90 tablet 1  . 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  Dx: B14.782 2 each 0  . folic acid (FOLVITE) 956 MCG tablet Take 800 mcg by mouth daily.    Marland Kitchen glimepiride (AMARYL) 1 MG tablet Take 1 tablet (1 mg total) by mouth daily with breakfast. 30 tablet 5  . Ipratropium-Albuterol (COMBIVENT) 20-100 MCG/ACT AERS respimat Inhale 1 puff into the lungs every 6 (six) hours as needed for wheezing or shortness of breath. 4 g 0  . Lancets  (ONETOUCH ULTRASOFT) lancets use as directed 100 each 1  . losartan (COZAAR) 50 MG tablet Take 1 tablet (50 mg total) by mouth daily. 90 tablet 3  . metoprolol succinate (TOPROL-XL) 50 MG 24 hr tablet Take 1 tablet (50 mg total) by mouth daily. Take with or immediately following a meal. 30 tablet 0  . niacin 500 MG tablet Take 500 mg by mouth daily.     . Omega-3 Fatty Acids (FISH OIL PO) Take 1,000 mg by mouth daily.    . ONE TOUCH ULTRA TEST test strip use as directed 100 each 1  . rosuvastatin (CRESTOR) 10 MG tablet TAKE 1 TABLET(10 MG) BY MOUTH DAILY 90 tablet 1   No current facility-administered medications on file prior to visit.   Allergies  Allergen Reactions  . Hydrochlorothiazide     SOB, dizzy  . Metformin And Related Other (See Comments)    Make him feel full and uncomfortable    Recent Results (from the past 2160 hour(s))  Lipid panel     Status: None   Collection Time: 04/02/21  2:40 PM  Result Value Ref Range   Cholesterol 117 0 - 200 mg/dL    Comment: ATP III Classification       Desirable:  < 200 mg/dL               Borderline High:  200 - 239 mg/dL  High:  > = 240 mg/dL   Triglycerides 61.0 0.0 - 149.0 mg/dL    Comment: Normal:  <150 mg/dLBorderline High:  150 - 199 mg/dL   HDL 39.30 >39.00 mg/dL   VLDL 12.2 0.0 - 40.0 mg/dL   LDL Cholesterol 65 0 - 99 mg/dL   Total CHOL/HDL Ratio 3     Comment:                Men          Women1/2 Average Risk     3.4          3.3Average Risk          5.0          4.42X Average Risk          9.6          7.13X Average Risk          15.0          11.0                       NonHDL 77.57     Comment: NOTE:  Non-HDL goal should be 30 mg/dL higher than patient's LDL goal (i.e. LDL goal of < 70 mg/dL, would have non-HDL goal of < 100 mg/dL)  Hemoglobin A1c     Status: Abnormal   Collection Time: 04/02/21  2:40 PM  Result Value Ref Range   Hgb A1c MFr Bld 7.2 (H) 4.6 - 6.5 %    Comment: Glycemic Control Guidelines for People  with Diabetes:Non Diabetic:  <6%Goal of Therapy: <7%Additional Action Suggested:  >8%   Comprehensive metabolic panel     Status: Abnormal   Collection Time: 04/02/21  2:40 PM  Result Value Ref Range   Sodium 140 135 - 145 mEq/L   Potassium 4.3 3.5 - 5.1 mEq/L   Chloride 106 96 - 112 mEq/L   CO2 25 19 - 32 mEq/L   Glucose, Bld 103 (H) 70 - 99 mg/dL   BUN 25 (H) 6 - 23 mg/dL   Creatinine, Ser 1.45 0.40 - 1.50 mg/dL   Total Bilirubin 0.8 0.2 - 1.2 mg/dL   Alkaline Phosphatase 93 39 - 117 U/L   AST 21 0 - 37 U/L   ALT 23 0 - 53 U/L   Total Protein 7.4 6.0 - 8.3 g/dL   Albumin 4.3 3.5 - 5.2 g/dL   GFR 47.73 (L) >60.00 mL/min    Comment: Calculated using the CKD-EPI Creatinine Equation (2021)   Calcium 9.7 8.4 - 10.5 mg/dL    Objective: General: Patient is awake, alert, and oriented x 3 and in no acute distress.  Integument: Skin is warm, dry and supple bilateral. Nails are tender, long, thickened and dystrophic with subungual debris, consistent with onychomycosis, 1-5 bilateral. No signs of infection. No open lesions. Remaining integument unremarkable.  Vasculature:  Dorsalis Pedis pulse 2/4 bilateral. Posterior Tibial pulse  1/4 left 0/4 on right. Capillary fill time <3 sec 1-5 bilateral. Positive hair growth to the level of the digits.Temperature gradient within normal limits. No varicosities present bilateral.  Neurology: The patient has intact sensation measured with a 5.07/10g Semmes Weinstein Monofilament at all pedal sites bilateral . Vibratory sensation diminished bilateral with tuning fork. No Babinski sign present bilateral.   Musculoskeletal: Asymptomatic bunion and planus foot type pedal deformities noted bilateral. Muscular strength 5/5 in all lower extremity muscular groups bilateral without pain on range of motion. No tenderness  with calf compression bilateral.  Assessment and Plan: Problem List Items Addressed This Visit   None   Visit Diagnoses    Pain due to  onychomycosis of toenails of both feet    -  Primary   Controlled type 2 diabetes mellitus without complication, without long-term current use of insulin ([place])          -Examined patient. -Re-Discussed and educated patient on diabetic foot care, especially with  regards to the vascular, neurological and musculoskeletal systems.  -Mechanically debrided all nails 1-5 bilateral using sterile nail nipper and filed with dremel without incident.  -Diabetic shoes dispensed with wear and break in explained -Patient to return in 3 months for at risk foot care -Patient advised to call the office if any problems or questions arise in the meantime.   Landis Martins, DPM

## 2021-05-25 NOTE — Progress Notes (Signed)
Subjective:    Patient ID: Alfred Foster, male    DOB: 1946/03/21, 75 y.o.   MRN: [id number]  HPI The patient is here for an acute visit.   Left side/leg pain - this did not get better with home exercises --  it starts in the posterior hip area - he has pain radiating down the leg.   It can last all day.  It is better after walking a while.   When he sits he feels it.  He sleeps good.        Here for follow up htn -- He is taking all of his medications as prescribed.  BP has been high at home.    Medications and allergies reviewed with patient and updated if appropriate.  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 90 tablet 3   apixaban (ELIQUIS) 5 MG TABS tablet Take 1 tablet (5 mg total) by mouth 2 (two) times daily. 180 tablet 2   Ascorbic Acid (VITAMIN C PO) Take 1 tablet by mouth daily.     aspirin 81 MG tablet Take 81 mg by mouth daily.     Cholecalciferol (VITAMIN D3) 1000 UNITS CAPS Take 1,000 Units by mouth daily.     dapagliflozin propanediol (FARXIGA) 10 MG TABS tablet Take 1 tablet (10 mg total) by mouth daily before breakfast. 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  Dx: Z02.585 2 each 0   folic acid (FOLVITE)  277 MCG tablet Take 800 mcg by mouth daily.     glimepiride (AMARYL) 1 MG tablet Take 1 tablet (1 mg total) by mouth daily with breakfast. 30 tablet 5   Ipratropium-Albuterol (COMBIVENT) 20-100 MCG/ACT AERS respimat Inhale 1 puff into the lungs every 6 (six) hours as needed for wheezing or shortness of breath. 4 g 0   Lancets (ONETOUCH ULTRASOFT) lancets use as directed 100 each 1   losartan (COZAAR) 50 MG tablet Take 1 tablet (50 mg total) by mouth daily. 90 tablet 3   metoprolol succinate (TOPROL-XL) 50 MG 24 hr tablet Take 1 tablet (50 mg total) by mouth daily. Take with or immediately following a meal. 30 tablet 0   niacin 500 MG tablet Take 500 mg by mouth daily.      Omega-3 Fatty Acids (FISH OIL PO) Take 1,000 mg by mouth daily.     ONE TOUCH ULTRA TEST test strip use as directed 100 each 1   rosuvastatin (CRESTOR) 10 MG tablet TAKE 1 TABLET(10 MG) BY MOUTH DAILY 90 tablet 1   No current facility-administered medications on file prior to visit.    Past Medical History:  Diagnosis Date   Diabetes mellitus    Hyperlipidemia    Hypertension    Prostate  cancer [facility]) 2007   Dr Alinda Money    Past Surgical History:  Procedure Laterality Date   COLONOSCOPY  2004 & 2014   Negative ;[facility]   INGUINAL HERNIA REPAIR     PROSTATECTOMY  04/2006   Dr Alinda Money   UMBILICAL HERNIA REPAIR      Social History   Socioeconomic History   Marital status: Married    Spouse name: Not on file   Number of children: Not on file   Years of education: Not on file   Highest education level: Not on file  Occupational History   Not on file  Tobacco Use   Smoking status: Never   Smokeless tobacco: Never  Substance and Sexual Activity   Alcohol use: No   Drug use: No   Sexual activity: Not on file  Other Topics Concern   Not on file  Social History Narrative   Not on file   Social Determinants of Health   Financial Resource Strain: Low Risk    Difficulty of Paying Living Expenses: Not hard  at all  Food Insecurity: No Food Insecurity   Worried About Running Out of Food in the Last Year: Never true   [place] in the Last Year: Never true  Transportation Needs: No Transportation Needs   Lack of Transportation (Medical): No   Lack of Transportation (Non-Medical): No  Physical Activity: Sufficiently Active   Days of Exercise per Week: 5 days   Minutes of Exercise per Session: 30 min  Stress: No Stress Concern Present   Feeling of Stress : Not at all  Social Connections: Socially Integrated   Frequency of Communication with Friends and Family: More than three times a week   Frequency of Social Gatherings with Friends and Family: Once a week   Attends Religious Services: More than 4 times per year   Active Member of [employer] or Organizations: Yes   Attends Music therapist: More than 4 times per year   Marital Status: Married    Family History  Problem Relation Age of Onset   Diabetes Father    Hypertension Father    Alzheimer's disease Father    Hypertension Mother    Alzheimer's disease Mother    Heart attack Mother 42   Diabetes Brother    Heart disease Brother    Heart attack Maternal Aunt        in 24s   Stroke Neg Hx     Review of Systems  Constitutional:  Negative for fever.  Respiratory:  Negative for cough, shortness of breath and wheezing.   Cardiovascular:  Negative for chest pain, palpitations and leg swelling.  Neurological:  Negative for light-headedness and headaches.      Objective:   Vitals:   05/26/21 1324  BP: (!) 158/92  Pulse: 80  Temp: 98.2 F (36.8 C)  SpO2: 99%   BP Readings from Last 3 Encounters:  05/26/21 (!) 158/92  04/02/21 (!) 142/90  10/09/20 (!) 142/90   Wt Readings from Last 3 Encounters:  05/26/21 221 lb (100.2 kg)  04/02/21 219 lb (99.3 kg)  10/09/20 218 lb (98.9 kg)   Body mass index is 29.16 kg/m.   Physical Exam    Constitutional: Appears well-developed and well-nourished. No distress.   Head: Normocephalic and atraumatic.  Neck: Neck supple. No tracheal deviation present. No thyromegaly present.  No cervical lymphadenopathy Cardiovascular: Normal rate, regular rhythm and normal heart sounds.  No murmur heard. No carotid bruit .  No edema Pulmonary/Chest: Effort normal and breath sounds normal. No respiratory distress. No has no wheezes. No rales.  Msk: pos tenderness with palpation of lateral aspect of left trochanter.  No lumbar spine pain.  Normal sensation/strength b/l LE Skin: Skin is warm and dry. Not diaphoretic.  Psychiatric: Normal mood and affect. Behavior is normal.       Assessment & Plan:    See Problem List for Assessment and Plan of chronic medical problems.    This visit occurred during the SARS-CoV-2 public health emergency.  Safety protocols were in place, including screening questions prior to the visit, additional usage of staff PPE, and extensive cleaning of exam room while observing appropriate contact time as indicated for disinfecting solutions.

## 2021-05-26 ENCOUNTER — Ambulatory Visit: Payer: Medicare Other | Admitting: Internal Medicine

## 2021-05-26 ENCOUNTER — Ambulatory Visit (INDEPENDENT_AMBULATORY_CARE_PROVIDER_SITE_OTHER): Payer: Medicare Other | Admitting: Internal Medicine

## 2021-05-26 ENCOUNTER — Encounter: Payer: Self-pay | Admitting: Internal Medicine

## 2021-05-26 ENCOUNTER — Other Ambulatory Visit: Payer: Self-pay

## 2021-05-26 VITALS — BP 158/92 | HR 80 | Temp 98.2°F | Ht 73.0 in | Wt 221.0 lb

## 2021-05-26 DIAGNOSIS — I10 Essential (primary) hypertension: Secondary | ICD-10-CM | POA: Diagnosis not present

## 2021-05-26 DIAGNOSIS — M79605 Pain in left leg: Secondary | ICD-10-CM | POA: Diagnosis not present

## 2021-05-26 DIAGNOSIS — E1165 Type 2 diabetes mellitus with hyperglycemia: Secondary | ICD-10-CM

## 2021-05-26 LAB — BASIC METABOLIC PANEL
BUN: 24 mg/dL — ABNORMAL HIGH (ref 6–23)
CO2: 22 mEq/L (ref 19–32)
Calcium: 9.3 mg/dL (ref 8.4–10.5)
Chloride: 112 mEq/L (ref 96–112)
Creatinine, Ser: 1.36 mg/dL (ref 0.40–1.50)
GFR: 51.49 mL/min — ABNORMAL LOW (ref >60.00)
Glucose, Bld: 107 mg/dL — ABNORMAL HIGH (ref 70–99)
Potassium: 4 mEq/L (ref 3.5–5.1)
Sodium: 141 mEq/L (ref 135–145)

## 2021-05-26 LAB — HEMOGLOBIN A1C: Hgb A1c MFr Bld: 7.1 % — ABNORMAL HIGH (ref 4.6–6.5)

## 2021-05-26 MED ORDER — METOPROLOL SUCCINATE ER 50 MG PO TB24: 75.0000 mg | ORAL_TABLET | Freq: Every day | ORAL | 1 refills | Status: DC

## 2021-05-26 NOTE — Patient Instructions (Addendum)
  Blood work was ordered.     Medications changes include :     Your prescription(s) have been submitted to your pharmacy. Please take as directed and contact our office if you believe you are having problem(s) with the medication(s).   A referral was ordered for sports medicine - Dr Georgina Snell.         Someone from their office will call you to schedule an appointment.    Please followup in 3 months

## 2021-05-26 NOTE — Assessment & Plan Note (Addendum)
Chronic Not ideally controlled Continue farxiga 10 mg dialy Glimepiride 1 mg daily added two months ago a1c

## 2021-05-26 NOTE — Assessment & Plan Note (Signed)
Subacute Started > 2 months ago - initially on lateral aspect of left hip -- now has pain radiating down leg No lower back pain Will refer to dr Georgina Snell

## 2021-05-26 NOTE — Assessment & Plan Note (Addendum)
Chronic BP not controlled Continue amlodipine 10 mg qd, losartan 50 mg qd Increase metoprolol xl to 75 mg qd Bmp today

## 2021-06-03 NOTE — Progress Notes (Signed)
   I, Wendy Poet, LAT, ATC, am serving as scribe for Dr. Lynne Leader.  Alfred Foster is a 75 y.o. male who presents to [place] at [facility] today for L leg pain.  He was last seen by Dr. Georgina Snell on 05/08/20 for R glute and leg pain.  Since then, pt reports L leg pain x 1 month.  He locates his pain to the lateral aspect of L hip w/ radiating down to L ankle along the lateral aspect. No numbness/tingling reported. Pt reports his wife is handicap and he has to lift her wheel chair and has been sleeping w/ her in an uncomfortable mattress.  Radiating pain: yes Low back pain: no Aggravating factors: sitting (riding in his work truck), squatting Treatments tried: exercises, massaging,   Dx imaging: 04/10/20 Pelvis XR  Pertinent review of systems: No fevers or chills  Relevant historical information: History of prostate cancer.   Exam:  BP (!) 142/88 (BP Location: Right Arm, Patient Position: Sitting, Cuff Size: Normal)   Pulse 68   Ht 6\' 1"  (1.854 m)   Wt 218 lb 9.6 oz (99.2 kg)   SpO2 98%   BMI 28.84 kg/m  General: Well Developed, well nourished, and in no acute distress.   MSK: L-spine normal-appearing Nontender midline. Normal lumbar motion. Lower extremity strength is intact except noted below. Mildly positive left-sided slump test. Left hip normal. Normal hip motion. Minimally tender palpation greater trochanter. Hip abduction strength diminished.  Pulses and capillary refill and sensation are intact distally.  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  No acute fractures. Await formal radiology review     Assessment and Plan: 75 y.o. male with left leg pain predominantly due to L5 radiculopathy left side.  Additionally patient does have some left greater trochanteric bursitis/hip abductor tendinopathy.  Treat with short course of  prednisone and trial gabapentin.  Additionally work on hip abductor strengthening.  Recheck in 6 weeks.  Return sooner if needed.   PDMP not reviewed this encounter. Orders Placed This Encounter  Procedures   DG Lumbar Spine 2-3 Views    Standing Status:   Future    Number of Occurrences:   1    Standing Expiration Date:   06/04/2022    Order Specific Question:   Reason for Exam (SYMPTOM  OR DIAGNOSIS REQUIRED)    Answer:   eval left L5 radiculopathy    Order Specific Question:   Preferred imaging location?    Answer:   [facility]   Meds ordered this encounter  Medications   predniSONE (DELTASONE) 50 MG tablet    Sig: Take 1 tablet (50 mg total) by mouth daily.    Dispense:  5 tablet    Refill:  0   gabapentin (NEURONTIN) 300 MG capsule    Sig: Take 1 capsule (300 mg total) by mouth 3 (three) times daily as needed.    Dispense:  90 capsule    Refill:  3     Discussed warning signs or symptoms. Please see discharge instructions. Patient expresses understanding.   The above documentation has been reviewed and is accurate and complete Lynne Leader, M.D.

## 2021-06-04 ENCOUNTER — Ambulatory Visit: Payer: Medicare Other | Admitting: Internal Medicine

## 2021-06-04 ENCOUNTER — Ambulatory Visit: Payer: Medicare Other | Admitting: Family Medicine

## 2021-06-04 ENCOUNTER — Other Ambulatory Visit: Payer: Self-pay | Admitting: Internal Medicine

## 2021-06-04 ENCOUNTER — Other Ambulatory Visit: Payer: Self-pay

## 2021-06-04 ENCOUNTER — Ambulatory Visit (INDEPENDENT_AMBULATORY_CARE_PROVIDER_SITE_OTHER): Payer: Medicare Other

## 2021-06-04 VITALS — BP 142/88 | HR 68 | Ht 73.0 in | Wt 218.6 lb

## 2021-06-04 DIAGNOSIS — M545 Low back pain, unspecified: Secondary | ICD-10-CM | POA: Diagnosis not present

## 2021-06-04 DIAGNOSIS — M5416 Radiculopathy, lumbar region: Secondary | ICD-10-CM

## 2021-06-04 DIAGNOSIS — M7062 Trochanteric bursitis, left hip: Secondary | ICD-10-CM | POA: Diagnosis not present

## 2021-06-04 MED ORDER — PREDNISONE 50 MG PO TABS: 50.0000 mg | ORAL_TABLET | Freq: Every day | ORAL | 0 refills | Status: DC

## 2021-06-04 MED ORDER — GABAPENTIN 300 MG PO CAPS: 300.0000 mg | ORAL_CAPSULE | Freq: Three times a day (TID) | ORAL | 3 refills | Status: DC | PRN

## 2021-06-04 NOTE — Patient Instructions (Addendum)
Thank you for coming in today.   Take the prednisone for 5 days.   Take the gabapentin as needed but mostly at bedtime.   Please complete the exercises that the athletic trainer went over with you:  View at [email or web address]-[email or web address] using code: [id number]  Please get an Xray today before you leave   Recheck with me in 6 weeks.   Let me know if this is not working.

## 2021-06-07 NOTE — Progress Notes (Signed)
X-ray lumbar spine shows arthritis

## 2021-06-09 ENCOUNTER — Telehealth: Payer: Self-pay | Admitting: Family Medicine

## 2021-06-09 DIAGNOSIS — M5416 Radiculopathy, lumbar region: Secondary | ICD-10-CM

## 2021-06-09 NOTE — Telephone Encounter (Signed)
Pt completed steroids and started Gabapentin, does not feel ANY better. He is a bit difficult to communicate with, as he does not answer direct questions. Based on our conversation, I would say he feels the same amount of pain and is not satisfied with the medication. He also mentioned an occasion of finding stool in his underwear and seemed to be unaware of how this occurred or if it is related to the meds. Does not feel tired or otherwise out of sorts from the medication.  Pt is working tomorrow and requests we call his wife, Alfred Foster on her cell [phone]. She is apparently unable to get to the home phone.

## 2021-06-10 NOTE — Telephone Encounter (Signed)
Called pt's wife Deidre Ala and advised for [place] to stop gabapentin.  She provides ok for MRI order and requests that order be sent to [facility] location.

## 2021-06-10 NOTE — Telephone Encounter (Signed)
Stop the gabapentin.  Next step would be MRI lumbar spine.  Would you like me to order that?

## 2021-06-16 NOTE — Telephone Encounter (Signed)
Lumbar MRI ordered.

## 2021-06-19 ENCOUNTER — Ambulatory Visit
Admission: RE | Admit: 2021-06-19 | Discharge: 2021-06-19 | Disposition: A | Discharge status: Home / Self Care | Payer: Medicare Other | Source: Ambulatory Visit | Attending: Family Medicine | Admitting: Family Medicine

## 2021-06-19 ENCOUNTER — Other Ambulatory Visit: Payer: Self-pay

## 2021-06-19 DIAGNOSIS — M5416 Radiculopathy, lumbar region: Secondary | ICD-10-CM

## 2021-06-19 DIAGNOSIS — M48061 Spinal stenosis, lumbar region without neurogenic claudication: Secondary | ICD-10-CM | POA: Diagnosis not present

## 2021-06-19 DIAGNOSIS — M545 Low back pain, unspecified: Secondary | ICD-10-CM | POA: Diagnosis not present

## 2021-06-22 ENCOUNTER — Telehealth: Payer: Self-pay | Admitting: Family Medicine

## 2021-06-22 DIAGNOSIS — M5416 Radiculopathy, lumbar region: Secondary | ICD-10-CM

## 2021-06-22 NOTE — Progress Notes (Signed)
MRI lumbar spine shows arthritis in the low back to could cause low back pain additionally shows potential for pinched nerve causing pain down your left leg.  I have already ordered an epidural steroid injection to [facility] imaging.  Please contact them at [phone] to schedule the epidural steroid injection.  You need to stop your Eliquis 24 hours prior to the injection and continue to be off of it for 48 hours after the injection.  If you would like to schedule a follow-up appointment with me sooner than August to go over the results and talk about the injection please feel free to schedule that.

## 2021-06-22 NOTE — Telephone Encounter (Signed)
Epidural steroid injection ordered 

## 2021-06-23 ENCOUNTER — Other Ambulatory Visit: Payer: Medicare Other

## 2021-06-28 ENCOUNTER — Ambulatory Visit
Admission: RE | Admit: 2021-06-28 | Discharge: 2021-06-28 | Disposition: A | Discharge status: Home / Self Care | Payer: Medicare Other | Source: Ambulatory Visit | Attending: Family Medicine | Admitting: Family Medicine

## 2021-06-28 ENCOUNTER — Other Ambulatory Visit: Payer: Self-pay

## 2021-06-28 DIAGNOSIS — M5416 Radiculopathy, lumbar region: Secondary | ICD-10-CM

## 2021-06-28 MED ORDER — IOPAMIDOL (ISOVUE-M 200) INJECTION 41%
1.0000 mL | Freq: Once | INTRAMUSCULAR | Status: AC
  Administered 2021-06-28: 1 mL via EPIDURAL

## 2021-06-28 MED ORDER — METHYLPREDNISOLONE ACETATE 40 MG/ML INJ SUSP (RADIOLOG
80.0000 mg | Freq: Once | INTRAMUSCULAR | Status: AC
  Administered 2021-06-28: 80 mg via EPIDURAL

## 2021-06-28 NOTE — Discharge Instructions (Signed)
Post Procedure Spinal Discharge Instruction Sheet  You may resume a regular diet and any medications that you routinely take (including pain medications) unless otherwise noted by MD.  No driving day of procedure.  Light activity throughout the rest of the day.  Do not do any strenuous work, exercise, bending or lifting.  The day following the procedure, you can resume normal physical activity but you should refrain from exercising or physical therapy for at least three days thereafter.  You may apply ice to the injection site, 20 minutes on, 20 minutes off, as needed. Do not apply ice directly to skin.    Common Side Effects:  Headaches- take your usual medications as directed by your physician.  Increase your fluid intake.  Caffeinated beverages may be helpful.  Lie flat in bed until your headache resolves.  Restlessness or inability to sleep- you may have trouble sleeping for the next few days.  Ask your referring physician if you need any medication for sleep.  Facial flushing or redness- should subside within a few days.  Increased pain- a temporary increase in pain a day or two following your procedure is not unusual.  Take your pain medication as prescribed by your referring physician.  Leg cramps  Please contact our office at [phone] for the following symptoms: Fever greater than 100 degrees. Headaches unresolved with medication after 2-3 days. Increased swelling, pain, or redness at injection site.   Thank you for visiting [facility] today.    YOU MAY RESUME YOUR ASPIRIN ANYTIME AFTER INEJECTION TODAY 06/28/21 YOU MAY RESUME YOUR ELIQUIS 24 HOURS AFTER INJECTION (06/29/21 AT 2:00 PM OR AFTER)

## 2021-07-02 ENCOUNTER — Telehealth: Payer: Self-pay | Admitting: Family Medicine

## 2021-07-02 ENCOUNTER — Other Ambulatory Visit: Payer: Self-pay | Admitting: Internal Medicine

## 2021-07-02 MED ORDER — TRAMADOL HCL 50 MG PO TABS: 50.0000 mg | ORAL_TABLET | Freq: Three times a day (TID) | ORAL | 0 refills | Status: DC | PRN

## 2021-07-02 MED ORDER — TIZANIDINE HCL 2 MG PO TABS: 2.0000 mg | ORAL_TABLET | Freq: Every evening | ORAL | 0 refills | Status: DC | PRN

## 2021-07-02 NOTE — Telephone Encounter (Signed)
Pt called, he sounded much clearer than he has in our previous conversations. Epidural was helpful but pt asking for muscle relaxer's for cramping and pain meds for pain.  He gets cramping in his thighs ( almost like a knot ), especially when trying to sleep. After work he is experiencing pain down the side of his leg and would like pain meds for this.

## 2021-07-02 NOTE — Telephone Encounter (Signed)
Gabapentin may be the most effective medicine for the nerve pain that you may be experiencing at bedtime.  I prescribed this at the last visit.  However I did prescribe a muscle relaxer tizanidine to take at bedtime.  I also prescribed tramadol which is a pain medicine.  All of these medicines can make you feel sleepy.  Use them with caution.

## 2021-07-02 NOTE — Telephone Encounter (Signed)
Per pt request, left detailed VM advising meds called in and to use with caution.

## 2021-07-09 ENCOUNTER — Other Ambulatory Visit: Payer: Medicare Other

## 2021-07-15 NOTE — Progress Notes (Signed)
I, Wendy Poet, LAT, ATC, am serving as scribe for Dr. Lynne Leader.  Alfred Foster is a 75 y.o. male who presents to [place] at [facility] today for f/u of L lateral leg pain thought to be due to L5 lumbar radiculopathy.  He was last seen by Dr. Georgina Snell on 06/04/21 and was prescribed Gabapentin and a short course of prednisone that did not help w/ his symptoms.  He was then referred for a lumbar MRI and later for a lumbar epidural that he had on 06/28/21 (L L5-S1 ESI).  Since his last visit w/ Dr. Georgina Snell, pt reports that his L leg is feeling better but he still has intermittent pain in his L leg, rating his improvement at 65%.  He has been taking the Tizanidine and Tramadol prescribed on 07/02/21 but is almost out of them.  Pain is located predominantly left lateral hip and left lateral calf.  Diagnostic testing: L-spine MRI- 06/19/21; L-spine XR- 06/04/21; Pelvis XR- 04/10/21  Pertinent review of systems: No fevers or chills  Relevant historical information: History of DVT.  History of DVT.   Exam:  BP (!) 140/98 (BP Location: Right Arm, Patient Position: Sitting, Cuff Size: Normal)   Pulse 77   Ht '6\' 1"'$  (1.854 m)   Wt 215 lb 3.2 oz (97.6 kg)   SpO2 98%   BMI 28.39 kg/m  General: Well Developed, well nourished, and in no acute distress.   MSK: Left hip normal-appearing Normal motion. Tender palpation greater trochanter. Hip abduction strength diminished 4/5.  L-spine nontender midline.  Normal lumbar motion.  Lower extremity strength is intact except noted above    Lab and Radiology Results  EXAM: MRI LUMBAR SPINE WITHOUT CONTRAST   TECHNIQUE: Multiplanar, multisequence MR imaging of the lumbar spine was performed. No intravenous contrast was administered.   COMPARISON:  None.   FINDINGS: Segmentation:  Standard.   Alignment:  L4-5 grade 1 anterolisthesis.   Vertebrae:  No fracture, evidence of discitis, or bone lesion.   Conus medullaris and cauda  equina: Conus extends to the L1 level. Conus and cauda equina appear normal.   Paraspinal and other soft tissues: Right renal cystic intensity.   Disc levels:   T12- L1: Unremarkable.   L1-L2: Unremarkable.   L2-L3: Mild disc bulging   L3-L4: Disc narrowing and bulging. Mild facet spurring. Mild-to-moderate right foraminal narrowing   L4-L5: Advanced facet osteoarthritis with spurring and anterolisthesis. Disc bulging eccentric to the right. Mild bilateral foraminal narrowing, greater on the right. Mild triangular narrowing of the thecal sac   L5-S1:Facet osteoarthritis on both sides. Disc narrowing and bulging. Left foraminal impingement.   IMPRESSION: 1. 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 2. L5-S1 left foraminal impingement. 3. L3-4 mild to moderate right foraminal narrowing. 4. L4-5 mild spinal stenosis.     Electronically Signed   By: Monte Fantasia M.D.   On: 06/20/2021 10:23 I, Lynne Leader, personally (independently) visualized and performed the interpretation of the images attached in this note.     Assessment and Plan: 75 y.o. male with left leg pain multifactorial. Left lower lateral calf pain is L5 lumbar radiculopathy.  This improved moderately with first lumbar epidural steroid injection.  He had an interlaminar left L5-S1 injection July 18.  He still has some residual symptoms.  Plan for repeat injection in the near future.  Left lateral hip pain thought to be trochanteric bursitis.  Plan for physical therapy.  He previously was taught home exercise program but has effectively not been doing much for this.  Physical therapy should be helpful.  Recheck in 6 weeks.   PDMP not reviewed this encounter. Orders Placed This Encounter  Procedures   DG INJECT DIAG/THERA/INC NEEDLE/CATH/PLC EPI/LUMB/SAC W/IMG    Standing Status:   Future    Standing Expiration Date:   07/16/2022    Order Specific Question:    Reason for Exam (SYMPTOM  OR DIAGNOSIS REQUIRED)    Answer:   ESI #2 left L5 radiculopathy Level and technique per radiology    Order Specific Question:   Preferred Imaging Location?    Answer:   [id number]    Order Specific Question:   Radiology Contrast Protocol - do NOT remove file path    Answer:   [email or web address]   Ambulatory referral to Physical Therapy    Referral Priority:   Routine    Referral Type:   Physical Medicine    Referral Reason:   Specialty Services Required    Requested Specialty:   Physical Therapy    Number of Visits Requested:   1   No orders of the defined types were placed in this encounter.    Discussed warning signs or symptoms. Please see discharge instructions. Patient expresses understanding.   The above documentation has been reviewed and is accurate and complete Lynne Leader, M.D.  Total encounter time 30 minutes including face-to-face time with the patient and, reviewing past medical record, and charting on the date of service.   Treatment plan and options

## 2021-07-16 ENCOUNTER — Encounter: Payer: Self-pay | Admitting: Family Medicine

## 2021-07-16 ENCOUNTER — Other Ambulatory Visit: Payer: Self-pay

## 2021-07-16 ENCOUNTER — Ambulatory Visit: Payer: Medicare Other | Admitting: Family Medicine

## 2021-07-16 VITALS — BP 140/98 | HR 77 | Ht 73.0 in | Wt 215.2 lb

## 2021-07-16 DIAGNOSIS — M7062 Trochanteric bursitis, left hip: Secondary | ICD-10-CM | POA: Diagnosis not present

## 2021-07-16 DIAGNOSIS — M5416 Radiculopathy, lumbar region: Secondary | ICD-10-CM

## 2021-07-16 NOTE — Patient Instructions (Signed)
Thank you for coming in today.   I've referred you to Physical Therapy.  Let us know if you don't hear from them in one week.   Please call [facility] at [phone] to schedule your spine injection.    Recheck in 6 weeks.   If not better we can do more. Next step can be an injection into the hip on your side there.

## 2021-07-20 ENCOUNTER — Telehealth: Payer: Self-pay | Admitting: Internal Medicine

## 2021-07-20 ENCOUNTER — Other Ambulatory Visit: Payer: Self-pay | Admitting: Internal Medicine

## 2021-07-20 ENCOUNTER — Ambulatory Visit: Payer: Medicare Other | Admitting: Internal Medicine

## 2021-07-20 NOTE — Telephone Encounter (Signed)
Pre-op form scanned in Media tab.

## 2021-07-20 NOTE — Telephone Encounter (Signed)
Spouse calling to request directions for patient staying off Eliquis prior  to getting an "injection in hip" [facility]  and Dr Lynne Leader would instructions before moving forward.  Please call

## 2021-07-20 NOTE — Telephone Encounter (Signed)
Form printed and placed in folder to be signed.

## 2021-07-21 NOTE — Telephone Encounter (Signed)
Form faxed today. Spoke with patient's wife and info given.

## 2021-07-29 ENCOUNTER — Telehealth: Payer: Self-pay | Admitting: Family Medicine

## 2021-07-29 MED ORDER — TRAMADOL HCL 50 MG PO TABS: 50.0000 mg | ORAL_TABLET | Freq: Three times a day (TID) | ORAL | 0 refills | Status: DC | PRN

## 2021-07-29 MED ORDER — TIZANIDINE HCL 2 MG PO TABS: 2.0000 mg | ORAL_TABLET | Freq: Every evening | ORAL | 0 refills | Status: DC | PRN

## 2021-07-29 NOTE — Telephone Encounter (Signed)
Pt needs refill of Tizanidine and Tramadol per wife. He is still having pain and is concerned it will be worse after PT tomorrow.

## 2021-07-29 NOTE — Telephone Encounter (Signed)
Medicines refilled

## 2021-07-30 ENCOUNTER — Other Ambulatory Visit: Payer: Self-pay

## 2021-07-30 ENCOUNTER — Ambulatory Visit: Payer: Medicare Other | Admitting: Rehabilitative and Restorative Service Providers"

## 2021-07-30 ENCOUNTER — Encounter: Payer: Self-pay | Admitting: Family Medicine

## 2021-07-30 DIAGNOSIS — M5442 Lumbago with sciatica, left side: Secondary | ICD-10-CM | POA: Diagnosis not present

## 2021-07-30 DIAGNOSIS — R262 Difficulty in walking, not elsewhere classified: Secondary | ICD-10-CM

## 2021-07-30 DIAGNOSIS — M79605 Pain in left leg: Secondary | ICD-10-CM

## 2021-07-30 DIAGNOSIS — G8929 Other chronic pain: Secondary | ICD-10-CM

## 2021-07-30 NOTE — Patient Instructions (Signed)
Access Code: [email or web address] URL: [email or web address] Date: 07/30/2021 Prepared by: Scot Jun  Exercises Prone Press Up - 2 x daily - 7 x weekly - 2-3 sets - 10 reps - 1-2 hold Supine Piriformis Stretch with Leg Straight (Mirrored) - 2 x daily - 7 x weekly - 1 sets - 5 reps - 30 hold Supine 90/90 Sciatic Nerve Glide with Knee Flexion/Extension (Mirrored) - 2 x daily - 7 x weekly - 3 sets - 10 reps

## 2021-07-30 NOTE — Therapy (Signed)
[facility] [address] [place], [place], [address] Phone: [phone]   Fax:  [phone]  Physical Therapy Evaluation  Patient Details  Name: Alfred Foster MRN: [id number] Date of Birth: 27-Feb-1946 Referring Provider (PT): Gregor Hams, MD   Encounter Date: 07/30/2021   PT End of Session - 07/30/21 0958     Visit Number 1    Number of Visits 20    Date for PT Re-Evaluation 10/08/21    Authorization Type [employer] Medicare $30 Copay    Progress Note Due on Visit 10    PT Start Time 1005    PT Stop Time 1046    PT Time Calculation (min) 41 min    Activity Tolerance Patient tolerated treatment well    Behavior During Therapy [facility] for tasks assessed/performed             Past Medical History:  Diagnosis Date   Diabetes mellitus    Hyperlipidemia    Hypertension    Prostate cancer [facility]) 2007   Dr Alinda Money    Past Surgical History:  Procedure Laterality Date   COLONOSCOPY  2004 & 2014   Negative ;[facility]   INGUINAL HERNIA REPAIR     PROSTATECTOMY  04/2006   Dr Alinda Money   UMBILICAL HERNIA REPAIR      There were no vitals filed for this visit.    Subjective Assessment - 07/30/21 1000     Subjective Pt.  came to clinic c complaints of back in back/Lt leg.  Has had imaging as documented below.  Pt. indicated he was going to have more imaging next week.   Pt. indicated coming and going of symptoms, sometimes worse in morning.  Pt. indicated onset of symptoms in June 2022.  Pt. indicated a previous incident of symptoms that improved c time and some exercises at that time. Didn't help this time around.  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  Hasn't missed work due to symptoms to this point.  2nd story bedroom.  Has to use cane in various hands.    Pertinent History Diabetes mellitus Hyperlipidemia  Hypertension Prostate cancer ([place]).  Lumbar epidural 06/28/2021 with some  improvement indicated by MD note following.    Limitations Standing;Walking;House hold activities;Sitting    How long can you sit comfortably? 1 hr    How long can you stand comfortably? 3-4 hours    Diagnostic tests MRI 06/19/2021 with L4-L5 mild spinal stenosis, foraminal narrowing, DDD, L4-L5 anteriolisthesis.   Lumbar, pelvis xray in 2022    Patient Stated Goals Reduce pain    Currently in Pain? Yes    Pain Score 6    pain at worst 8/10   Pain Location Back   Lt leg   Pain Orientation Left    Pain Descriptors / Indicators Aching;Tingling    Pain Type Chronic pain    Pain Radiating Towards Lt lateral leg down to lower leg (tingling into leg at times)    Pain Onset More than a month ago    Pain Frequency Intermittent    Aggravating Factors  lifting/carrying, getting out of bed after lying down, insidious cramping at times, bending, pressure on Lt leg, standing, walking/stairs    Pain Relieving Factors Epidural, OTC                [facility] PT Assessment - 07/30/21 0001       Assessment   Medical  Diagnosis M70.62 (ICD-10-CM) - Trochanteric bursitis of left hip  M54.16 (ICD-10-CM) - Lumbar radiculitis    Referring Provider (PT) Gregor Hams, MD    Onset Date/Surgical Date 05/12/21    Hand Dominance Right      Precautions   Precautions None      Balance Screen   Has the patient fallen in the past 6 months No    Has the patient had a decrease in activity level because of a fear of falling?  No    Is the patient reluctant to leave their home because of a fear of falling?  No      Home Environment   Additional Comments 2 story house with bedroom on second story      Prior Function   Level of [place] Requirements Works full time driving delivery truck, has to Production designer, theatre/television/film car, housework, movie      Cognition   Overall Cognitive Status Within Functional Limits for tasks assessed      Observation/Other Assessments   Focus on  Therapeutic Outcomes (FOTO)  Intake 65%, predicted 70%      Posture/Postural Control   Posture Comments Sitting posture shifting to Rt side.  Standing posture revealed very mild Rt lateral trunk lean, equal iliac crest height.      ROM / Strength   AROM / PROM / Strength Strength;PROM;AROM      AROM   Overall AROM Comments Indications of directional preference    AROM Assessment Site Lumbar;Hip    Right/Left Hip Left;Right    Lumbar Flexion movement to ankles, pain in back/hip/leg at end range    Lumbar Extension 50% WFL no back pain, reduced Lt lateral leg symptoms in calf.  repeated x 5 continued reduced end range symptoms      Strength   Strength Assessment Site Hip;Knee;Ankle    Right/Left Hip Right;Left    Right Hip Flexion 5/5    Left Hip Flexion 5/5    Right/Left Knee Right;Left    Right Knee Flexion 5/5    Right Knee Extension 5/5    Left Knee Flexion 5/5    Left Knee Extension 5/5    Right/Left Ankle Right;Left    Right Ankle Dorsiflexion 5/5    Left Ankle Dorsiflexion 5/5      Palpation   Palpation comment Tenderness in lateral/posterior glute musculature      Special Tests   Other special tests (+) Slump on Lt at full range                        Objective measurements completed on examination: See above findings.       [facility] Adult PT Treatment/Exercise - 07/30/21 0001       Exercises   Exercises Other Exercises;Lumbar    Other Exercises  HEP instruction/performance c cues for techniques, handout provided.  Trial set performed of each for comprehension and symptom assessment.  HEP consisting of prone press ups, supine knee to opposite shoulder, supine sciatic nerve flossing      Manual Therapy   Manual therapy comments Lateral shift correction hips movement to Rt x 10, supine sciatic nerve flossing (Lt knee extension c PF, knee flexion c DF)                    PT Education - 07/30/21 [id number]     Education Details HEP, POC,  directional preference response  information    Person(s) Educated Patient    Methods Explanation;Demonstration;Handout;Verbal cues    Comprehension Returned demonstration;Verbalized understanding              PT Short Term Goals - 07/30/21 0956       PT SHORT TERM GOAL #1   Title Patient will demonstrate independent use of home exercise program to maintain progress from in clinic treatments.    Time 3    Period Weeks    Status New    Target Date 08/20/21               PT Long Term Goals - 07/30/21 0956       PT LONG TERM GOAL #1   Title Patient will demonstrate/report pain at worst less than or equal to 2/10 to facilitate minimal limitation in daily activity secondary to pain symptoms.    Time 10    Period Weeks    Status New    Target Date 10/08/21      PT LONG TERM GOAL #2   Title Patient will demonstrate independent use of home exercise program to facilitate ability to maintain/progress functional gains from skilled physical therapy services.    Time 10    Period Weeks    Status New    Target Date 10/08/21      PT LONG TERM GOAL #3   Title Pt. will demonstrate FOTO outcome > or = 70% to indicated reduced disability due to condition.    Time 10    Period Weeks    Status New    Target Date 10/08/21      PT LONG TERM GOAL #4   Title Pt. will demonstrate lumbar extension 100 % WFL s symptoms to facilitate upright standing/walking posture at PLOF.    Time 10    Period Weeks    Status New    Target Date 10/08/21      PT LONG TERM GOAL #5   Title Pt. will demonstrate/report ability to perform lifting floor to waist at work s restriction, driving s restriction for work activity.    Time 10    Period Weeks    Status New    Target Date 10/08/21                    Plan - 07/30/21 0957     Clinical Impression Statement Patient is a 75 y.o. who comes to clinic with complaints of low back, Lt leg pain with probable directional preference for  extension c mobility deficits noted that impair their ability to perform usual daily and recreational functional activities without increase difficulty/symptoms at this time.  Patient to benefit from skilled PT services to address impairments and limitations to improve to previous level of function without restriction secondary to condition.    Personal Factors and Comorbidities Comorbidity 3+    Comorbidities Diabetes mellitus Hyperlipidemia  Hypertension Prostate cancer ([place])    Examination-Activity Limitations Sit;Bed Mobility;Bend;Squat;Stairs;Carry;Stand;Lift;Locomotion Level    Examination-Participation Restrictions Estate agent;Yard Work;Driving;Occupation    Stability/Clinical Decision Making Stable/Uncomplicated    Clinical Decision Making Low    Rehab Potential Good    PT Frequency --   1-2x/week   PT Duration Other (comment)   10 weeks   PT Treatment/Interventions ADLs/Self Care Home Management;Electrical Stimulation;Cryotherapy;Iontophoresis '4mg'$ /ml Dexamethasone;Therapeutic exercise;Traction;Moist Heat;Balance training;Therapeutic activities;Functional mobility training;Stair training;Gait training;DME Instruction;Ultrasound;Neuromuscular re-education;Patient/family education;Passive range of motion;Spinal Manipulations;Joint Manipulations;Dry needling;Taping;Manual techniques    PT Next Visit Plan Reassess directional preference for extension response, inclusion  of standing lumbar extension if appropriate    PT Home Exercise Plan [email or web address]    Consulted and Agree with Plan of Care Patient             Patient will benefit from skilled therapeutic intervention in order to improve the following deficits and impairments:  Abnormal gait, Hypomobility, Pain, Decreased activity tolerance, Decreased mobility, Difficulty walking, Improper body mechanics, Impaired perceived functional ability, Postural dysfunction, Impaired flexibility, Decreased range of motion  Visit  Diagnosis: Chronic left-sided low back pain with left-sided sciatica  Pain in left leg  Difficulty in walking, not elsewhere classified     Problem List Patient Active Problem List   Diagnosis Date Noted   Trochanteric bursitis of left hip 06/04/2021   Secondary hypercoagulable state ([place]) 10/08/2020   Lumbar radiculitis 02/28/2020   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 [place], [place], [address] Phone: [phone]   Fax:  [phone]  Name: Alfred Foster MRN: [id number] Date of Birth: Mar 15, 1946

## 2021-08-02 ENCOUNTER — Ambulatory Visit
Admission: RE | Admit: 2021-08-02 | Discharge: 2021-08-02 | Disposition: A | Discharge status: Home / Self Care | Payer: Medicare Other | Source: Ambulatory Visit | Attending: Family Medicine | Admitting: Family Medicine

## 2021-08-02 ENCOUNTER — Other Ambulatory Visit: Payer: Self-pay

## 2021-08-02 DIAGNOSIS — M47817 Spondylosis without myelopathy or radiculopathy, lumbosacral region: Secondary | ICD-10-CM | POA: Diagnosis not present

## 2021-08-02 DIAGNOSIS — M5416 Radiculopathy, lumbar region: Secondary | ICD-10-CM

## 2021-08-02 MED ORDER — IOPAMIDOL (ISOVUE-M 200) INJECTION 41%
1.0000 mL | Freq: Once | INTRAMUSCULAR | Status: AC
  Administered 2021-08-02: 1 mL via EPIDURAL

## 2021-08-02 MED ORDER — METHYLPREDNISOLONE ACETATE 40 MG/ML INJ SUSP (RADIOLOG
80.0000 mg | Freq: Once | INTRAMUSCULAR | Status: AC
  Administered 2021-08-02: 80 mg via EPIDURAL

## 2021-08-02 NOTE — Discharge Instructions (Signed)
Post Procedure Spinal Discharge Instruction Sheet  You may resume a regular diet and any medications that you routinely take (including pain medications) unless otherwise noted by MD.  No driving day of procedure.  Light activity throughout the rest of the day.  Do not do any strenuous work, exercise, bending or lifting.  The day following the procedure, you can resume normal physical activity but you should refrain from exercising or physical therapy for at least three days thereafter.  You may apply ice to the injection site, 20 minutes on, 20 minutes off, as needed. Do not apply ice directly to skin.    Common Side Effects:  Headaches- take your usual medications as directed by your physician.  Increase your fluid intake.  Caffeinated beverages may be helpful.  Lie flat in bed until your headache resolves.  Restlessness or inability to sleep- you may have trouble sleeping for the next few days.  Ask your referring physician if you need any medication for sleep.  Facial flushing or redness- should subside within a few days.  Increased pain- a temporary increase in pain a day or two following your procedure is not unusual.  Take your pain medication as prescribed by your referring physician.  Leg cramps  Please contact our office at [phone] for the following symptoms: Fever greater than 100 degrees. Headaches unresolved with medication after 2-3 days. Increased swelling, pain, or redness at injection site.   Thank you for visiting [facility] today.     YOU MAY RESUME YOUR ASPIRIN ANYTIME AFTER INJECTION TODAY.  YOU MAY RESUME YOUR ELIQUIS 24 HOURS AFTER INJECTION.

## 2021-08-12 ENCOUNTER — Encounter: Payer: Self-pay | Admitting: Sports Medicine

## 2021-08-12 ENCOUNTER — Ambulatory Visit: Payer: Medicare Other | Admitting: Sports Medicine

## 2021-08-12 ENCOUNTER — Other Ambulatory Visit: Payer: Self-pay

## 2021-08-12 DIAGNOSIS — M79674 Pain in right toe(s): Secondary | ICD-10-CM

## 2021-08-12 DIAGNOSIS — M79675 Pain in left toe(s): Secondary | ICD-10-CM

## 2021-08-12 DIAGNOSIS — E119 Type 2 diabetes mellitus without complications: Secondary | ICD-10-CM

## 2021-08-12 DIAGNOSIS — B351 Tinea unguium: Secondary | ICD-10-CM

## 2021-08-12 NOTE — Progress Notes (Signed)
Subjective: Alfred Foster is a 75 y.o. male patient with history of diabetes who returns to office today complaining of long, painful nails while ambulating in shoes; unable to trim. Patient reports that his nails are really long at this time.  No other issues.  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 90 tablet 3   Ascorbic Acid (VITAMIN C PO) Take 1 tablet by mouth daily.     aspirin 81 MG tablet Take 81 mg by mouth daily.     Cholecalciferol (VITAMIN D3) 1000 UNITS CAPS Take 1,000 Units by mouth daily.     dapagliflozin propanediol (FARXIGA) 10 MG TABS tablet Take 1 tablet (10 mg total) by mouth daily before breakfast. 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  Dx: I82.461 2 each 0   ELIQUIS 5 MG TABS tablet TAKE 1 TABLET(5 MG) BY MOUTH TWICE DAILY 99991111 tablet 2   folic acid (FOLVITE) Q000111Q MCG tablet Take 800 mcg by mouth daily.     glimepiride (AMARYL) 1 MG tablet Take 1 tablet (1 mg total) by mouth daily with breakfast. 30 tablet 5   Ipratropium-Albuterol (COMBIVENT) 20-100 MCG/ACT AERS respimat Inhale 1 puff into the lungs every 6 (six) hours as needed for wheezing or shortness of breath. 4 g 0   Lancets (ONETOUCH ULTRASOFT) lancets use as  directed 100 each 1   losartan (COZAAR) 50 MG tablet Take 1 tablet (50 mg total) by mouth daily. 90 tablet 3   metoprolol succinate (TOPROL-XL) 50 MG 24 hr tablet Take 1.5 tablets (75 mg total) by mouth daily. Take with or immediately following a meal. 135 tablet 1   niacin 500 MG tablet Take 500 mg by mouth daily.      Omega-3 Fatty Acids (FISH OIL PO) Take 1,000 mg by mouth daily.     ONE TOUCH ULTRA TEST test strip use as directed 100 each 1   rosuvastatin (CRESTOR) 10 MG tablet TAKE 1 TABLET(10 MG) BY MOUTH DAILY 90 tablet 1   tiZANidine (ZANAFLEX) 2 MG tablet Take 1 tablet (2 mg total) by mouth at bedtime as needed for muscle spasms. 30 tablet 0   traMADol (ULTRAM) 50 MG tablet Take 1 tablet (50 mg total) by mouth every 8 (eight) hours as needed for severe pain. 15 tablet 0   No current facility-administered medications on file prior to visit.   Allergies  Allergen Reactions   Hydrochlorothiazide     SOB, dizzy   Metformin And Related Other (See Comments)    Make him feel full and uncomfortable    Recent Results (from the past 2160 hour(s))  Basic metabolic panel     Status: Abnormal   Collection Time: 05/26/21  2:00 PM  Result Value Ref Range  Sodium 141 135 - 145 mEq/L   Potassium 4.0 3.5 - 5.1 mEq/L   Chloride 112 96 - 112 mEq/L   CO2 22 19 - 32 mEq/L   Glucose, Bld 107 (H) 70 - 99 mg/dL   BUN 24 (H) 6 - 23 mg/dL   Creatinine, Ser 1.36 0.40 - 1.50 mg/dL   GFR 51.49 (L) >60.00 mL/min    Comment: Calculated using the CKD-EPI Creatinine Equation (2021)   Calcium 9.3 8.4 - 10.5 mg/dL  Hemoglobin A1c     Status: Abnormal   Collection Time: 05/26/21  2:00 PM  Result Value Ref Range   Hgb A1c MFr Bld 7.1 (H) 4.6 - 6.5 %    Comment: Glycemic Control Guidelines for People with Diabetes:Non Diabetic:  <6%Goal of Therapy: <7%Additional Action Suggested:  >8%     Objective: General: Patient is awake, alert, and oriented x 3 and in no acute distress.  Integument: Skin is warm,  dry and supple bilateral. Nails are tender, long, thickened and dystrophic with subungual debris, consistent with onychomycosis, 1-5 bilateral. No signs of infection. No open lesions. Remaining integument unremarkable.  Vasculature:  Dorsalis Pedis pulse 2/4 bilateral. Posterior Tibial pulse  1/4 left 0/4 on right. Capillary fill time <3 sec 1-5 bilateral. Positive hair growth to the level of the digits.Temperature gradient within normal limits. No varicosities present bilateral.  Neurology: The patient has intact sensation measured with a 5.07/10g Semmes Weinstein Monofilament at all pedal sites bilateral . Vibratory sensation diminished bilateral with tuning fork. No Babinski sign present bilateral.   Musculoskeletal: Asymptomatic bunion and planus foot type pedal deformities noted bilateral. Muscular strength 5/5 in all lower extremity muscular groups bilateral without pain on range of motion. No tenderness with calf compression bilateral.  Assessment and Plan: Problem List Items Addressed This Visit   None Visit Diagnoses     Pain due to onychomycosis of toenails of both feet    -  Primary   Controlled type 2 diabetes mellitus without complication, without long-term current use of insulin ([place])           -Examined patient. -Re-Discussed and educated patient on diabetic foot care, especially with  regards to the vascular, neurological and musculoskeletal systems.  -Mechanically debrided all nails 1-5 bilateral using sterile nail nipper and filed with dremel without incident.  -Patient to return in 3 months for at risk foot care -Patient advised to call the office if any problems or questions arise in the meantime.   Landis Martins, DPM

## 2021-08-13 ENCOUNTER — Ambulatory Visit: Payer: Medicare Other | Admitting: Rehabilitative and Restorative Service Providers"

## 2021-08-13 ENCOUNTER — Telehealth: Payer: Self-pay | Admitting: Internal Medicine

## 2021-08-13 ENCOUNTER — Encounter: Payer: Self-pay | Admitting: Rehabilitative and Restorative Service Providers"

## 2021-08-13 DIAGNOSIS — M5442 Lumbago with sciatica, left side: Secondary | ICD-10-CM

## 2021-08-13 DIAGNOSIS — M79605 Pain in left leg: Secondary | ICD-10-CM

## 2021-08-13 DIAGNOSIS — G8929 Other chronic pain: Secondary | ICD-10-CM

## 2021-08-13 DIAGNOSIS — R262 Difficulty in walking, not elsewhere classified: Secondary | ICD-10-CM

## 2021-08-13 NOTE — Therapy (Addendum)
[place], [place], [address] Phone: [phone]   Fax:  [phone]  Physical Therapy Treatment /Discharge   Patient Details  Name: Alfred Foster MRN: [id number] Date of Birth: 08-Aug-1946 Referring Provider (PT): Gregor Hams, MD   Encounter Date: 08/13/2021   PT End of Session - 08/13/21 0857     Visit Number 2    Number of Visits 20    Date for PT Re-Evaluation 10/08/21    Authorization Type [employer] Medicare $30 Copay    Progress Note Due on Visit 10    PT Start Time 0802    PT Stop Time 9030    PT Time Calculation (min) 42 min    Activity Tolerance Patient tolerated treatment well;No increased pain    Behavior During Therapy [facility] for tasks assessed/performed             Past Medical History:  Diagnosis Date   Diabetes mellitus    Hyperlipidemia    Hypertension    Prostate cancer [facility]) 2007   Dr Alinda Money    Past Surgical History:  Procedure Laterality Date   COLONOSCOPY  2004 & 2014   Negative ;[facility]   INGUINAL HERNIA REPAIR     PROSTATECTOMY  04/2006   Dr Alinda Money   UMBILICAL HERNIA REPAIR      There were no vitals filed for this visit.   Subjective Assessment - 08/13/21 0852     Subjective Alfred Foster reports early HEP compliance.  He had good questions about the mechanics required with his job and with helping care for his disabled wife.    Pertinent History Diabetes mellitus Hyperlipidemia  Hypertension Prostate cancer ([place]).  Lumbar epidural 06/28/2021 with some improvement indicated by MD note following.    Limitations Standing;Walking;House hold activities;Sitting    How long can you sit comfortably? 1 hr    How long can you stand comfortably? 3-4 hours    Diagnostic tests MRI 06/19/2021 with L4-L5 mild spinal stenosis, foraminal narrowing, DDD, L4-L5 anteriolisthesis.   Lumbar, pelvis xray in 2022    Patient Stated Goals Reduce pain    Currently in Pain? Yes    Pain Score 6     Pain Location  Back    Pain Orientation Left    Pain Descriptors / Indicators Aching;Sore;Tightness    Pain Type Chronic pain    Pain Radiating Towards L LE below the knee into the calf    Pain Onset More than a month ago    Pain Frequency Intermittent    Aggravating Factors  Bending, lifting, prolonged postures and movement after prolonged postures (driving and sleeping)    Pain Relieving Factors Walking and change of position    Effect of Pain on Daily Activities Is unable to accomplish all chores required for working and maintaining his house and lawn.    Multiple Pain Sites No                               [facility] Adult PT Treatment/Exercise - 08/13/21 0001       Posture/Postural Control   Posture/Postural Control Postural limitations    Postural Limitations Decreased lumbar lordosis    Posture Comments Mildly slouched posture, good when cued      Therapeutic Activites    Therapeutic Activities ADL's    ADL's Golfer's and diagonal squat lift, mechanics for getting his wife's chair in and out of the car/van  Exercises   Exercises Lumbar      Lumbar Exercises: Stretches   Active Hamstring Stretch Left;Right;4 reps;30 seconds    Other Lumbar Stretch Exercise Knee to opposite shoulder (other leg straight) 4X 20 seconds L and R    Other Lumbar Stretch Exercise Standing trunk extension AROM 2 sets of 10 for 3 seconds      Lumbar Exercises: Standing   Other Standing Lumbar Exercises Shoulder blade pinches 10X 5 seconds      Lumbar Exercises: Prone   Other Prone Lumbar Exercises Prone press-ups within comfort 10X 2 seconds                    PT Education - 08/13/21 0855     Education Details Spent a lot of time working on Engineer, production and diagonal squat lift, specifically 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    Person(s) Educated Patient    Methods Explanation;Demonstration;Tactile  cues;Verbal cues;Handout    Comprehension Verbal cues required;Need further instruction;Returned demonstration;Verbalized understanding;Tactile cues required              PT Short Term Goals - 08/13/21 0856       PT SHORT TERM GOAL #1   Title Patient will demonstrate independent use of home exercise program to maintain progress from in clinic treatments.    Time 3    Period Weeks    Status On-going    Target Date 08/20/21               PT Long Term Goals - 07/30/21 0956       PT LONG TERM GOAL #1   Title Patient will demonstrate/report pain at worst less than or equal to 2/10 to facilitate minimal limitation in daily activity secondary to pain symptoms.    Time 10    Period Weeks    Status New    Target Date 10/08/21      PT LONG TERM GOAL #2   Title Patient will demonstrate independent use of home exercise program to facilitate ability to maintain/progress functional gains from skilled physical therapy services.    Time 10    Period Weeks    Status New    Target Date 10/08/21      PT LONG TERM GOAL #3   Title Pt. will demonstrate FOTO outcome > or = 70% to indicated reduced disability due to condition.    Time 10    Period Weeks    Status New    Target Date 10/08/21      PT LONG TERM GOAL #4   Title Pt. will demonstrate lumbar extension 100 % WFL s symptoms to facilitate upright standing/walking posture at PLOF.    Time 10    Period Weeks    Status New    Target Date 10/08/21      PT LONG TERM GOAL #5   Title Pt. will demonstrate/report ability to perform lifting floor to waist at work s restriction, driving s restriction for work activity.    Time 10    Period Weeks    Status New    Target Date 10/08/21                   Plan - 08/13/21 0857     Clinical Impression Statement Alfred Foster did require feedback and correction with his day 1 HEP.  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  Spent time addressing postural and body mechanics questions Alfred Foster had, specifically relating to helping care for his disabled wife.  Alfred Foster will benefit from continued posture and body mechanics work, LE strength and flexibility and low back strength work to meet LTGs.    Personal Factors and Comorbidities Comorbidity 3+    Comorbidities Diabetes mellitus Hyperlipidemia  Hypertension Prostate cancer ([place])    Examination-Activity Limitations Sit;Bed Mobility;Bend;Squat;Stairs;Carry;Stand;Lift;Locomotion Level    Examination-Participation Restrictions Estate agent;Yard Work;Driving;Occupation    Stability/Clinical Decision Making Stable/Uncomplicated    Rehab Potential Good    PT Frequency --   1-2x/week   PT Duration Other (comment)   10 weeks   PT Treatment/Interventions ADLs/Self Care Home Management;Electrical Stimulation;Cryotherapy;Iontophoresis 101m/ml Dexamethasone;Therapeutic exercise;Traction;Moist Heat;Balance training;Therapeutic activities;Functional mobility training;Stair training;Gait training;DME Instruction;Ultrasound;Neuromuscular re-education;Patient/family education;Passive range of motion;Spinal Manipulations;Joint Manipulations;Dry needling;Taping;Manual techniques    PT Next Visit Plan Review HEP (straight other leg with hamstrings stretch), practical body mechanics, LE and low back strengthening (bridging, prone leg extension, leg press)    PT Home Exercise Plan [email or web address]    Consulted and Agree with Plan of Care Patient             Patient will benefit from skilled therapeutic intervention in order to improve the following deficits and impairments:  Abnormal gait, Hypomobility, Pain, Decreased activity tolerance, Decreased mobility, Difficulty walking, Improper body mechanics, Impaired perceived functional ability, Postural dysfunction, Impaired flexibility, Decreased range of  motion  Visit Diagnosis: Difficulty in walking, not elsewhere classified  Chronic left-sided low back pain with left-sided sciatica  Pain in left leg     Problem List Patient Active Problem List   Diagnosis Date Noted   Trochanteric bursitis of left hip 06/04/2021   Secondary hypercoagulable state ([place] 10/08/2020   Lumbar radiculitis 02/28/2020   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) ([place] 09/12/2018   Carotid artery disease ([place] 10/21/2017   Right carotid bruit 09/15/2017   Lipoma of skin of abdomen 03/19/2015   Nonspecific abnormal electrocardiogram (ECG) (EKG) 03/15/2012   Essential hypertension 10/22/2010   PROSTATE CANCER, HX OF 01/23/2009   Diabetes mellitus ([place] 08/09/2007   Hyperlipidemia 05/08/2007    RFarley LyPT, MPT 08/13/2021, 9:02 AM   PHYSICAL THERAPY DISCHARGE SUMMARY  Visits from Start of Care: 2  Current functional level related to goals / functional outcomes: See note   Remaining deficits: See note   Education / Equipment: HEP   Patient agrees to discharge. Patient goals were partially met. Patient is being discharged due to not returning since the last visit. MScot Jun PT, DPT, OCS, ATC 09/20/21  9:54 AM    [facility] [address] [address]Phone: [phone]  Fax:  [phone] Name: Alfred HIPPEMRN: [id number] of Birth: [birth year]

## 2021-08-13 NOTE — Patient Instructions (Signed)
Access Code: [email or web address] URL: [email or web address] Date: 08/13/2021 Prepared by: Vista Mink  Exercises Prone Press Up - 2 x daily - 7 x weekly - 2-3 sets - 10 reps - 1-2 hold Supine Piriformis Stretch with Leg Straight (Mirrored) - 2 x daily - 7 x weekly - 1 sets - 5 reps - 30 hold Supine 90/90 Sciatic Nerve Glide with Knee Flexion/Extension (Mirrored) - 2 x daily - 7 x weekly - 3 sets - 10 reps Standing Scapular Retraction - 5 x daily - 7 x weekly - 1 sets - 5 reps - 5 second hold Standing Lumbar Extension at [place] - 5 x daily - 7 x weekly - 1 sets - 5 reps - 3 seconds hold

## 2021-08-13 NOTE — Telephone Encounter (Signed)
Type of form received (Home Health, FMLA, disability, handicapped placard, Surgical clearance) FMLA  Form placed in (E-fax folder, Provider mailbox) PROVIDER MAILBOX   Additional instructions from the patient (mail, fax, notify by phone when complete) NOTIFY BY PHONE    Things to remember: [place] office: If form received in person, remind patient that forms take 7-10 business days CMA should attach charge sheet and put on Supervisor's desk

## 2021-08-17 NOTE — Telephone Encounter (Signed)
Paperwork received and waiting to be completed

## 2021-08-19 DIAGNOSIS — Z0279 Encounter for issue of other medical certificate: Secondary | ICD-10-CM

## 2021-08-19 NOTE — Telephone Encounter (Signed)
Forms completed and patient notified.   Placed up front for pick up.  Form given to Amy

## 2021-08-23 ENCOUNTER — Telehealth: Payer: Self-pay | Admitting: *Deleted

## 2021-08-23 NOTE — Telephone Encounter (Signed)
Pt called requesting a refill of tramadol to be sent to [facility] [address].

## 2021-08-24 ENCOUNTER — Encounter: Payer: Medicare Other | Admitting: Rehabilitative and Restorative Service Providers"

## 2021-08-24 MED ORDER — TRAMADOL HCL 50 MG PO TABS: 50.0000 mg | ORAL_TABLET | Freq: Three times a day (TID) | ORAL | 0 refills | Status: DC | PRN

## 2021-08-24 NOTE — Telephone Encounter (Signed)
done

## 2021-08-26 NOTE — Progress Notes (Signed)
I, Wendy Poet, LAT, ATC, am serving as scribe for Dr. Lynne Leader.  Alfred Foster is a 75 y.o. male who presents to [facility] at [facility] today for f/u of L lateral leg pain thought to be due to L5 lumbar radiculopathy.  He was last seen by Dr. Georgina Snell on 07/16/21 and noted 65% improvement in his L leg pain after having a L L5-S1 ESI on 06/28/21.  He was referred to PT for his L lateral hip pain thought to be due to trochanteric bursitis and has completed 2 sessions.  He was also referred for a 2nd lumbar ESI that he had on 08/02/21 (L L5-S1).  Today, pt reports that his L lateral lower leg pain persists intermittently.  He doesn't feel like the epidurals have helped.  He is also still having L lateral hip pain and he is scheduled for his next appt on 08/31/21.  Diagnostic testing: L-spine MRI- 06/19/21; L-spine XR- 06/04/21; Pelvis XR- 04/10/21   Pertinent review of systems: No fevers or chills  Relevant historical information: He has to care for his wife who uses a wheelchair.   Exam:  BP (!) 140/98 (BP Location: Left Arm, Patient Position: Sitting, Cuff Size: Normal)   Pulse 81   Ht '6\' 1"'$  (1.854 m)   Wt 209 lb 9.6 oz (95.1 kg)   SpO2 99%   BMI 27.65 kg/m  General: Well Developed, well nourished, and in no acute distress.   MSK: Left hip tender palpation greater trochanter.   Slight decrease sensation left lateral calf.    Lab and Radiology Results EXAM: MRI LUMBAR SPINE WITHOUT CONTRAST   TECHNIQUE: Multiplanar, multisequence MR imaging of the lumbar spine was performed. No intravenous contrast was administered.   COMPARISON:  None.   FINDINGS: Segmentation:  Standard.   Alignment:  L4-5 grade 1 anterolisthesis.   Vertebrae:  No fracture, evidence of discitis, or bone lesion.   Conus medullaris and cauda equina: Conus extends to the L1 level. Conus and cauda equina appear normal.   Paraspinal and other soft tissues: Right renal cystic intensity.    Disc levels:   T12- L1: Unremarkable.   L1-L2: Unremarkable.   L2-L3: Mild disc bulging   L3-L4: Disc narrowing and bulging. Mild facet spurring. Mild-to-moderate right foraminal narrowing   L4-L5: Advanced facet osteoarthritis with spurring and anterolisthesis. Disc bulging eccentric to the right. Mild bilateral foraminal narrowing, greater on the right. Mild triangular narrowing of the thecal sac   L5-S1:Facet osteoarthritis on both sides. Disc narrowing and bulging. Left foraminal impingement.   IMPRESSION: 1. 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 2. L5-S1 left foraminal impingement. 3. L3-4 mild to moderate right foraminal narrowing. 4. L4-5 mild spinal stenosis.     Electronically Signed   By: Monte Fantasia M.D.   On: 06/20/2021 10:23   I, Lynne Leader, personally (independently) visualized and performed the interpretation of the images attached in this note.     Assessment and Plan: 75 y.o. male with multifactorial left leg pain.  Pain thought to be related to L5 for lumbar radiculopathy and greater trochanteric bursitis.  Unfortunately he is not responding to the typical conservative management strategy of epidural steroid injection and physical therapy for trochanteric bursitis.  His bigger issue today is the pain in the lateral calf and foot that should be from L5 lumbar radiculopathy.  Before proceeding with his third and final epidural steroid injection we will proceed with nerve  conduction study to further characterize source of his pain to confirm diagnosis.  Continue physical therapy for the lateral hip pain.  Check back after epidural steroid injection.  Certainly could do an injection to the lateral hip if needed.   PDMP not reviewed this encounter. Orders Placed This Encounter  Procedures   Ambulatory referral to Neurology    Referral Priority:   Routine    Referral Type:   Consultation    Referral  Reason:   Specialty Services Required    Requested Specialty:   Neurology    Number of Visits Requested:   1   Nerve conduction test    Standing Status:   Future    Standing Expiration Date:   08/27/2022    Order Specific Question:   Where should this test be performed?    Answer:   [facility]   No orders of the defined types were placed in this encounter.    Discussed warning signs or symptoms. Please see discharge instructions. Patient expresses understanding.   The above documentation has been reviewed and is accurate and complete Lynne Leader, M.D.

## 2021-08-26 NOTE — Progress Notes (Signed)
Subjective:    Patient ID: Alfred Foster, male    DOB: 1946-08-14, 75 y.o.   MRN: [id number]  This visit occurred during the SARS-CoV-2 public health emergency.  Safety protocols were in place, including screening questions prior to the visit, additional usage of staff PPE, and extensive cleaning of exam room while observing appropriate contact time as indicated for disinfecting solutions.     HPI The patient is here for follow up of their chronic medical problems, including htn, DM, hichol  Increased his metoprolol 3 months ago.    Left leg pain 3 months ago - saw Dr Georgina Snell.  He has had two injections and there has been no improvement.  He sees Dr Georgina Snell today.  He is taking all of his medications as prescribed.    Medications and allergies reviewed with patient and updated if appropriate.  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 90 tablet 3   Ascorbic Acid (VITAMIN C PO) Take 1 tablet by mouth daily.     aspirin 81 MG tablet Take 81 mg by mouth daily.     Cholecalciferol (VITAMIN D3) 1000 UNITS CAPS Take 1,000 Units by mouth daily.     dapagliflozin propanediol (FARXIGA) 10 MG TABS tablet Take 1 tablet (10 mg total) by mouth daily before breakfast. 90 tablet 1   Elastic Bandages & Supports  (MEDICAL COMPRESSION SOCKS) MISC Use daily for leg swelling from DVT.  Dx: I82.461 2 each 0   ELIQUIS 5 MG TABS tablet TAKE 1 TABLET(5 MG) BY MOUTH TWICE DAILY 99991111 tablet 2   folic acid (FOLVITE) Q000111Q MCG tablet Take 800 mcg by mouth daily.     glimepiride (AMARYL) 1 MG tablet Take 1 tablet (1 mg total) by mouth daily with breakfast. 30 tablet 5   Ipratropium-Albuterol (COMBIVENT) 20-100 MCG/ACT AERS respimat Inhale 1 puff into the lungs every 6 (six) hours as needed for wheezing or shortness of breath. 4 g 0   Lancets (ONETOUCH ULTRASOFT) lancets use as directed 100 each 1   losartan (COZAAR) 50 MG tablet Take 1 tablet (50 mg total) by mouth daily. 90 tablet 3   metoprolol succinate (TOPROL-XL) 50 MG 24 hr tablet Take 1.5 tablets (75 mg total) by mouth daily. Take with or immediately following a meal. 135 tablet 1   niacin 500 MG tablet Take 500 mg by mouth daily.      Omega-3 Fatty Acids (FISH OIL PO) Take 1,000 mg by mouth daily.     ONE TOUCH ULTRA TEST test strip use as directed 100 each 1   rosuvastatin (CRESTOR) 10 MG tablet TAKE 1 TABLET(10 MG) BY MOUTH DAILY 90 tablet 1   tiZANidine (ZANAFLEX) 2 MG tablet Take 1 tablet (2 mg total) by mouth at bedtime as needed for muscle spasms. 30 tablet  0   traMADol (ULTRAM) 50 MG tablet Take 1 tablet (50 mg total) by mouth every 8 (eight) hours as needed for severe pain. 15 tablet 0   No current facility-administered medications on file prior to visit.    Past Medical History:  Diagnosis Date   Diabetes mellitus    Hyperlipidemia    Hypertension    Prostate cancer [facility]) 2007   Dr Alinda Money    Past Surgical History:  Procedure Laterality Date   COLONOSCOPY  2004 & 2014   Negative ;[facility]   INGUINAL HERNIA REPAIR     PROSTATECTOMY  04/2006   Dr Alinda Money   UMBILICAL HERNIA REPAIR      Social History   Socioeconomic History   Marital status: Married    Spouse name: Not on file   Number of children: Not on file   Years of education:  Not on file   Highest education level: Not on file  Occupational History   Not on file  Tobacco Use   Smoking status: Never   Smokeless tobacco: Never  Substance and Sexual Activity   Alcohol use: No   Drug use: No   Sexual activity: Not on file  Other Topics Concern   Not on file  Social History Narrative   Not on file   Social Determinants of Health   Financial Resource Strain: Low Risk    Difficulty of Paying Living Expenses: Not hard at all  Food Insecurity: No Food Insecurity   Worried About Running Out of Food in the Last Year: Never true   [place] in the Last Year: Never true  Transportation Needs: No Transportation Needs   Lack of Transportation (Medical): No   Lack of Transportation (Non-Medical): No  Physical Activity: Sufficiently Active   Days of Exercise per Week: 5 days   Minutes of Exercise per Session: 30 min  Stress: No Stress Concern Present   Feeling of Stress : Not at all  Social Connections: Socially Integrated   Frequency of Communication with Friends and Family: More than three times a week   Frequency of Social Gatherings with Friends and Family: Once a week   Attends Religious Services: More than 4 times per year   Active Member of [employer] or Organizations: Yes   Attends Music therapist: More than 4 times per year   Marital Status: Married    Family History  Problem Relation Age of Onset   Diabetes Father    Hypertension Father    Alzheimer's disease Father    Hypertension Mother    Alzheimer's disease Mother    Heart attack Mother 55   Diabetes Brother    Heart disease Brother    Heart attack Maternal Aunt        in 37s   Stroke Neg Hx     Review of Systems  Constitutional:  Negative for chills and fever.  Respiratory:  Negative for cough, shortness of breath and wheezing.   Cardiovascular:  Positive for leg swelling (left leg - from neuralgia). Negative for chest pain and palpitations.  Musculoskeletal:   Positive for back pain.  Neurological:  Negative for light-headedness, numbness and headaches.  Hematological:  Does not bruise/bleed easily.      Objective:   Vitals:   08/27/21 0803  BP: (!) 140/98  Pulse: 81  Temp: 98 F (36.7 C)  SpO2: 99%   BP Readings from Last 3 Encounters:  08/27/21 (!) 140/98  08/02/21 (!) 182/109  07/16/21 (!) 140/98   Wt Readings from Last 3 Encounters:  08/27/21 209 lb (94.8 kg)  07/16/21 215 lb 3.2 oz (97.6 kg)  06/04/21 218 lb 9.6 oz (99.2 kg)   Body mass index is 27.57 kg/m.   Physical Exam    Constitutional: Appears well-developed and well-nourished. No distress.  HENT:  Head: Normocephalic and atraumatic.  Neck: Neck supple. No tracheal deviation present. No thyromegaly present.  No cervical lymphadenopathy Cardiovascular: Normal rate, regular rhythm and normal heart sounds.   No murmur heard. No carotid bruit .  No edema Pulmonary/Chest: Effort normal and breath sounds normal. No respiratory distress. No has no wheezes. No rales.  Skin: Skin is warm and dry. Not diaphoretic.  Psychiatric: Normal mood and affect. Behavior is normal.      Assessment & Plan:    See Problem List for Assessment and Plan of chronic medical problems.

## 2021-08-26 NOTE — Patient Instructions (Addendum)
  Blood work was ordered.     Flu immunization administered today.     Medications changes include :   increase losartan to 100 mg, increase metoprolol xl to 100 mg daily  Your prescription(s) have been submitted to your pharmacy. Please take as directed and contact our office if you believe you are having problem(s) with the medication(s).     Please followup in 3 months

## 2021-08-27 ENCOUNTER — Ambulatory Visit (INDEPENDENT_AMBULATORY_CARE_PROVIDER_SITE_OTHER): Payer: Medicare Other | Admitting: Internal Medicine

## 2021-08-27 ENCOUNTER — Ambulatory Visit: Payer: Medicare Other | Admitting: Family Medicine

## 2021-08-27 ENCOUNTER — Encounter: Payer: Self-pay | Admitting: Family Medicine

## 2021-08-27 ENCOUNTER — Encounter: Payer: Self-pay | Admitting: Internal Medicine

## 2021-08-27 ENCOUNTER — Other Ambulatory Visit: Payer: Self-pay

## 2021-08-27 VITALS — BP 140/98 | HR 81 | Ht 73.0 in | Wt 209.6 lb

## 2021-08-27 VITALS — BP 140/98 | HR 81 | Temp 98.0°F | Ht 73.0 in | Wt 209.0 lb

## 2021-08-27 DIAGNOSIS — E1165 Type 2 diabetes mellitus with hyperglycemia: Secondary | ICD-10-CM

## 2021-08-27 DIAGNOSIS — I82511 Chronic embolism and thrombosis of right femoral vein: Secondary | ICD-10-CM | POA: Diagnosis not present

## 2021-08-27 DIAGNOSIS — M7062 Trochanteric bursitis, left hip: Secondary | ICD-10-CM

## 2021-08-27 DIAGNOSIS — Z23 Encounter for immunization: Secondary | ICD-10-CM | POA: Diagnosis not present

## 2021-08-27 DIAGNOSIS — E782 Mixed hyperlipidemia: Secondary | ICD-10-CM | POA: Diagnosis not present

## 2021-08-27 DIAGNOSIS — I10 Essential (primary) hypertension: Secondary | ICD-10-CM | POA: Diagnosis not present

## 2021-08-27 DIAGNOSIS — M5416 Radiculopathy, lumbar region: Secondary | ICD-10-CM | POA: Diagnosis not present

## 2021-08-27 LAB — CBC WITH DIFFERENTIAL/PLATELET
Basophils Absolute: 0 10*3/uL (ref 0.0–0.1)
Basophils Relative: 0.6 % (ref 0.0–3.0)
Eosinophils Absolute: 0.2 10*3/uL (ref 0.0–0.7)
Eosinophils Relative: 3.6 % (ref 0.0–5.0)
HCT: 40.9 % (ref 39.0–52.0)
Hemoglobin: 13.6 g/dL (ref 13.0–17.0)
Lymphocytes Relative: 34.7 % (ref 12.0–46.0)
Lymphs Abs: 1.8 10*3/uL (ref 0.7–4.0)
MCHC: 33.4 g/dL (ref 30.0–36.0)
MCV: 95.3 fl (ref 78.0–100.0)
Monocytes Absolute: 0.5 10*3/uL (ref 0.1–1.0)
Monocytes Relative: 10.2 % (ref 3.0–12.0)
Neutro Abs: 2.7 10*3/uL (ref 1.4–7.7)
Neutrophils Relative %: 50.9 % (ref 43.0–77.0)
Platelets: 226 10*3/uL (ref 150.0–400.0)
RBC: 4.29 Mil/uL (ref 4.22–5.81)
RDW: 13.5 % (ref 11.5–15.5)
WBC: 5.3 10*3/uL (ref 4.0–10.5)

## 2021-08-27 LAB — COMPREHENSIVE METABOLIC PANEL
ALT: 20 U/L (ref 0–53)
AST: 18 U/L (ref 0–37)
Albumin: 4 g/dL (ref 3.5–5.2)
Alkaline Phosphatase: 81 U/L (ref 39–117)
BUN: 16 mg/dL (ref 6–23)
CO2: 25 mEq/L (ref 19–32)
Calcium: 9.4 mg/dL (ref 8.4–10.5)
Chloride: 108 mEq/L (ref 96–112)
Creatinine, Ser: 1.41 mg/dL (ref 0.40–1.50)
GFR: 49.22 mL/min — ABNORMAL LOW (ref >60.00)
Glucose, Bld: 105 mg/dL — ABNORMAL HIGH (ref 70–99)
Potassium: 3.8 mEq/L (ref 3.5–5.1)
Sodium: 141 mEq/L (ref 135–145)
Total Bilirubin: 0.7 mg/dL (ref 0.2–1.2)
Total Protein: 6.8 g/dL (ref 6.0–8.3)

## 2021-08-27 LAB — LIPID PANEL
Cholesterol: 102 mg/dL (ref 0–200)
HDL: 41.2 mg/dL (ref >39.00)
LDL Cholesterol: 53 mg/dL (ref 0–99)
NonHDL: 61.28
Total CHOL/HDL Ratio: 2
Triglycerides: 43 mg/dL (ref 0.0–149.0)
VLDL: 8.6 mg/dL (ref 0.0–40.0)

## 2021-08-27 LAB — HEMOGLOBIN A1C: Hgb A1c MFr Bld: 6.8 % — ABNORMAL HIGH (ref 4.6–6.5)

## 2021-08-27 MED ORDER — LOSARTAN POTASSIUM 100 MG PO TABS
100.0000 mg | ORAL_TABLET | Freq: Every day | ORAL | 1 refills | Status: DC
Start: 2021-08-27 — End: 2022-07-08

## 2021-08-27 MED ORDER — METOPROLOL SUCCINATE ER 100 MG PO TB24: 100.0000 mg | ORAL_TABLET | Freq: Every day | ORAL | 3 refills | Status: DC

## 2021-08-27 NOTE — Assessment & Plan Note (Signed)
Chronic Not controlled Continue amlodipine 10 mg qd, Increase metoprolol xl to 100 mg daily, increase losartan to 100 mg daily cmp

## 2021-08-27 NOTE — Patient Instructions (Signed)
Thank you for coming in today.   Continue PT.   Plan for nerve conduction study.   Recheck with me after I get the results back from the nerve test.   I may be doing a shot into the side of your hip if we need to.   Let me know if that nerve test will take a long time.

## 2021-08-27 NOTE — Assessment & Plan Note (Addendum)
Chronic Lab Results  Component Value Date   HGBA1C 7.1 (H) 05/26/2021   Not ideally controlled Check a1c today Continue glimepiride 1 mg daily, farxiga 10 mg daily Has lost weight - continue weight loss efforts continue diabetic diet

## 2021-08-27 NOTE — Assessment & Plan Note (Signed)
Chronic Check lipid panel  Continue crestor 10 mg  Regular exercise and healthy diet encouraged  

## 2021-08-27 NOTE — Addendum Note (Signed)
Addended by: Marcina Millard on: 08/27/2021 04:57 PM   Modules accepted: Orders

## 2021-08-27 NOTE — Assessment & Plan Note (Signed)
Chronic H/o DVT x2 lifelong a/c No side effects Continue eliquis 5 mg bid Cbc, cmp

## 2021-08-31 ENCOUNTER — Encounter: Payer: Medicare Other | Admitting: Rehabilitative and Restorative Service Providers"

## 2021-09-03 ENCOUNTER — Encounter: Payer: Self-pay | Admitting: Neurology

## 2021-09-26 ENCOUNTER — Other Ambulatory Visit: Payer: Self-pay | Admitting: Family Medicine

## 2021-09-27 NOTE — Telephone Encounter (Signed)
Rx refill request approved per Dr. Corey's orders. 

## 2021-09-29 ENCOUNTER — Other Ambulatory Visit: Payer: Self-pay

## 2021-09-29 DIAGNOSIS — R202 Paresthesia of skin: Secondary | ICD-10-CM

## 2021-09-30 ENCOUNTER — Other Ambulatory Visit: Payer: Self-pay

## 2021-09-30 ENCOUNTER — Ambulatory Visit: Payer: Medicare Other | Admitting: Neurology

## 2021-09-30 DIAGNOSIS — M79605 Pain in left leg: Secondary | ICD-10-CM

## 2021-09-30 DIAGNOSIS — R202 Paresthesia of skin: Secondary | ICD-10-CM | POA: Diagnosis not present

## 2021-09-30 NOTE — Procedures (Signed)
[facility]  [address] [place]  [place], [place] [address] Tel: [phone] Fax:  [phone] Test Date:  09/30/2021  Patient: Alfred Foster DOB: 05-04-1946 Physician: Narda Amber, DO  Sex: Male Height: 6\' 1"  Ref Phys: Michiel Cowboy, MD  ID#: [id number]   Technician:    Patient Complaints: This is a 75 year old man referred for evaluation of left leg pain.  NCV & EMG Findings: Extensive electrodiagnostic testing of the left lower extremity shows:  Left sural and superficial peroneal sensory responses are within normal limits. Left peroneal motor response at the extensor digitorum brevis is reduced, and normal at the tibialis anterior.  Left tibial motor responses within normal limits. Left tibial H reflex study is within normal limits. There is no evidence of active or chronic motor axonal loss changes affecting any of the tested muscles.  Motor unit configuration and recruitment pattern is within normal limits.  Impression: This is a normal study of the left lower extremity.  In particular, there is no evidence of a lumbosacral radiculopathy or sensorimotor polyneuropathy.   ___________________________ Narda Amber, DO    Nerve Conduction Studies Anti Sensory Summary Table   Stim Site NR Peak (ms) Norm Peak (ms) P-T Amp (V) Norm P-T Amp  Left Sup Peroneal Anti Sensory (Ant Lat Mall)  35C  12 cm    2.4 <4.6 6.0 >3  Left Sural Anti Sensory (Lat Mall)  35C  Calf    2.8 <4.6 6.1 >3   Motor Summary Table   Stim Site NR Onset (ms) Norm Onset (ms) O-P Amp (mV) Norm O-P Amp Site1 Site2 Delta-0 (ms) Dist (cm) Vel (m/s) Norm Vel (m/s)  Left Peroneal Motor (Ext Dig Brev)  35C  Ankle    4.1 <6.0 1.0 >2.5 B Fib Ankle 9.3 39.0 42 >40  B Fib    13.4  0.9  Poplt B Fib 1.8 10.0 56 >40  Poplt    15.2  0.8         Left Peroneal TA Motor (Tib Ant)  35C  Fib Head    3.4 <4.5 3.1 >3 Poplit Fib Head 1.7 10.0 59 >40  Poplit    5.1  3.0         Left Tibial Motor (Abd  Hall Brev)  35C  Ankle    3.7 <6.0 9.9 >4 Knee Ankle 9.3 42.0 45 >40  Knee    13.0  7.3          H Reflex Studies   NR H-Lat (ms) Lat Norm (ms) L-R H-Lat (ms)  Left Tibial (Gastroc)  35C     34.92 <35    EMG   Side Muscle Ins Act Fibs Psw Fasc Number Recrt Dur Dur. Amp Amp. Poly Poly. Comment  Left AntTibialis Nml Nml Nml Nml Nml Nml Nml Nml Nml Nml Nml Nml N/A  Left Gastroc Nml Nml Nml Nml Nml Nml Nml Nml Nml Nml Nml Nml N/A  Left RectFemoris Nml Nml Nml Nml Nml Nml Nml Nml Nml Nml Nml Nml N/A  Left GluteusMed Nml Nml Nml Nml Nml Nml Nml Nml Nml Nml Nml Nml N/A  Left BicepsFemS Nml Nml Nml Nml Nml Nml Nml Nml Nml Nml Nml Nml N/A      Waveforms:

## 2021-10-01 NOTE — Progress Notes (Signed)
Nerve conduction study is normal.  It does not explain the cause of your leg pain.  Recommend return to clinic to go over the results of all of the testing that you had done so far and try to figure out why your leg hurts.

## 2021-10-04 ENCOUNTER — Other Ambulatory Visit: Payer: Self-pay | Admitting: Internal Medicine

## 2021-10-08 ENCOUNTER — Ambulatory Visit: Payer: Medicare Other | Admitting: Family Medicine

## 2021-10-28 ENCOUNTER — Other Ambulatory Visit: Payer: Self-pay | Admitting: Family Medicine

## 2021-10-29 NOTE — Telephone Encounter (Signed)
Rx refill request approved per Dr. Corey's orders. 

## 2021-11-11 ENCOUNTER — Other Ambulatory Visit: Payer: Self-pay

## 2021-11-11 ENCOUNTER — Ambulatory Visit: Payer: Medicare Other | Admitting: Sports Medicine

## 2021-11-11 ENCOUNTER — Encounter: Payer: Self-pay | Admitting: Sports Medicine

## 2021-11-11 DIAGNOSIS — M79674 Pain in right toe(s): Secondary | ICD-10-CM

## 2021-11-11 DIAGNOSIS — M79675 Pain in left toe(s): Secondary | ICD-10-CM | POA: Diagnosis not present

## 2021-11-11 DIAGNOSIS — B351 Tinea unguium: Secondary | ICD-10-CM

## 2021-11-11 DIAGNOSIS — E119 Type 2 diabetes mellitus without complications: Secondary | ICD-10-CM

## 2021-11-11 NOTE — Progress Notes (Signed)
Subjective: Alfred Foster is a 75 y.o. male patient with history of diabetes who returns to office today complaining of long, painful nails while ambulating in shoes; unable to trim.  FBS not recorded, a1c 6.5, Burns, Claudina Lick, MD, last visit September 2022. No other issues.  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) 08/09/2007   Hyperlipidemia 05/08/2007   Current Outpatient Medications on File Prior to Visit  Medication Sig Dispense Refill   amLODipine (NORVASC) 10 MG tablet Take 1 tablet (10 mg total) by mouth daily. 90 tablet 3   Ascorbic Acid (VITAMIN C PO) Take 1 tablet by mouth daily.     aspirin 81 MG tablet Take 81 mg by mouth daily.     Cholecalciferol (VITAMIN D3) 1000 UNITS CAPS Take 1,000 Units by mouth daily.     dapagliflozin propanediol (FARXIGA) 10 MG TABS tablet Take 1 tablet (10 mg total) by mouth daily before breakfast. 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  Dx: I82.461 2 each 0   ELIQUIS 5 MG TABS tablet TAKE 1 TABLET(5 MG) BY MOUTH TWICE DAILY 846 tablet 2   folic acid (FOLVITE) 962 MCG tablet Take 800 mcg by mouth daily.     glimepiride (AMARYL) 1 MG tablet TAKE 1 TABLET(1 MG) BY MOUTH DAILY WITH BREAKFAST 30 tablet 5   Ipratropium-Albuterol (COMBIVENT) 20-100 MCG/ACT AERS respimat Inhale 1 puff into the lungs every 6 (six) hours as needed for wheezing or shortness of breath. 4 g 0   Lancets (ONETOUCH ULTRASOFT) lancets use  as directed 100 each 1   losartan (COZAAR) 100 MG tablet Take 1 tablet (100 mg total) by mouth daily. 90 tablet 1   losartan (COZAAR) 50 MG tablet Take 50 mg by mouth daily.     metoprolol succinate (TOPROL-XL) 100 MG 24 hr tablet Take 1 tablet (100 mg total) by mouth daily. Take with or immediately following a meal. 90 tablet 3   niacin 500 MG tablet Take 500 mg by mouth daily.      Omega-3 Fatty Acids (FISH OIL PO) Take 1,000 mg by mouth daily.     ONE TOUCH ULTRA TEST test strip use as directed 100 each 1   rosuvastatin (CRESTOR) 10 MG tablet TAKE 1 TABLET(10 MG) BY MOUTH DAILY 90 tablet 1   tiZANidine (ZANAFLEX) 2 MG tablet TAKE 1 TABLET(2 MG) BY MOUTH AT BEDTIME AS NEEDED FOR MUSCLE SPASMS 30 tablet 0   traMADol (ULTRAM) 50 MG tablet Take 1 tablet (50 mg total) by mouth every 8 (eight) hours as needed for severe pain. 15 tablet 0   No current facility-administered medications on file prior to visit.   Allergies  Allergen Reactions   Hydrochlorothiazide     SOB, dizzy   Metformin And Related Other (See Comments)    Make him feel full and uncomfortable    Recent Results (from the past 2160 hour(s))  CBC with Differential/Platelet     Status: None  Collection Time: 08/27/21  9:15 AM  Result Value Ref Range   WBC 5.3 4.0 - 10.5 K/uL   RBC 4.29 4.22 - 5.81 Mil/uL   Hemoglobin 13.6 13.0 - 17.0 g/dL   HCT 40.9 39.0 - 52.0 %   MCV 95.3 78.0 - 100.0 fl   MCHC 33.4 30.0 - 36.0 g/dL   RDW 13.5 11.5 - 15.5 %   Platelets 226.0 150.0 - 400.0 K/uL   Neutrophils Relative % 50.9 43.0 - 77.0 %   Lymphocytes Relative 34.7 12.0 - 46.0 %   Monocytes Relative 10.2 3.0 - 12.0 %   Eosinophils Relative 3.6 0.0 - 5.0 %   Basophils Relative 0.6 0.0 - 3.0 %   Neutro Abs 2.7 1.4 - 7.7 K/uL   Lymphs Abs 1.8 0.7 - 4.0 K/uL   Monocytes Absolute 0.5 0.1 - 1.0 K/uL   Eosinophils Absolute 0.2 0.0 - 0.7 K/uL   Basophils Absolute 0.0 0.0 - 0.1 K/uL  Lipid panel     Status: None   Collection Time:  08/27/21  9:15 AM  Result Value Ref Range   Cholesterol 102 0 - 200 mg/dL    Comment: ATP III Classification       Desirable:  < 200 mg/dL               Borderline High:  200 - 239 mg/dL          High:  > = 240 mg/dL   Triglycerides 43.0 0.0 - 149.0 mg/dL    Comment: Normal:  <150 mg/dLBorderline High:  150 - 199 mg/dL   HDL 41.20 >39.00 mg/dL   VLDL 8.6 0.0 - 40.0 mg/dL   LDL Cholesterol 53 0 - 99 mg/dL   Total CHOL/HDL Ratio 2     Comment:                Men          Women1/2 Average Risk     3.4          3.3Average Risk          5.0          4.42X Average Risk          9.6          7.13X Average Risk          15.0          11.0                       NonHDL 61.28     Comment: NOTE:  Non-HDL goal should be 30 mg/dL higher than patient's LDL goal (i.e. LDL goal of < 70 mg/dL, would have non-HDL goal of < 100 mg/dL)  Hemoglobin A1c     Status: Abnormal   Collection Time: 08/27/21  9:15 AM  Result Value Ref Range   Hgb A1c MFr Bld 6.8 (H) 4.6 - 6.5 %    Comment: Glycemic Control Guidelines for People with Diabetes:Non Diabetic:  <6%Goal of Therapy: <7%Additional Action Suggested:  >8%   Comprehensive metabolic panel     Status: Abnormal   Collection Time: 08/27/21  9:15 AM  Result Value Ref Range   Sodium 141 135 - 145 mEq/L   Potassium 3.8 3.5 - 5.1 mEq/L   Chloride 108 96 - 112 mEq/L   CO2 25 19 - 32 mEq/L   Glucose, Bld 105 (H) 70 - 99 mg/dL   BUN 16 6 - 23  mg/dL   Creatinine, Ser 1.41 0.40 - 1.50 mg/dL   Total Bilirubin 0.7 0.2 - 1.2 mg/dL   Alkaline Phosphatase 81 39 - 117 U/L   AST 18 0 - 37 U/L   ALT 20 0 - 53 U/L   Total Protein 6.8 6.0 - 8.3 g/dL   Albumin 4.0 3.5 - 5.2 g/dL   GFR 49.22 (L) >60.00 mL/min    Comment: Calculated using the CKD-EPI Creatinine Equation (2021)   Calcium 9.4 8.4 - 10.5 mg/dL    Objective: General: Patient is awake, alert, and oriented x 3 and in no acute distress.  Integument: Skin is warm, dry and supple bilateral. Nails are tender, long,  thickened and dystrophic with subungual debris, consistent with onychomycosis, 1-5 bilateral. No signs of infection. No open lesions. Remaining integument unremarkable.  Vasculature:  Dorsalis Pedis pulse 2/4 bilateral. Posterior Tibial pulse  1/4 bilateral today. Capillary fill time <3 sec 1-5 bilateral. Positive hair growth to the level of the digits.Temperature gradient within normal limits. No varicosities present bilateral.  Neurology: Gross sensation present via light touch bilateral.   Musculoskeletal: Asymptomatic bunion and planus foot type pedal deformities noted bilateral. Muscular strength 5/5 in all lower extremity muscular groups bilateral without pain on range of motion. No tenderness with calf compression bilateral.  Assessment and Plan: Problem List Items Addressed This Visit   None Visit Diagnoses     Pain due to onychomycosis of toenails of both feet    -  Primary   Controlled type 2 diabetes mellitus without complication, without long-term current use of insulin (HCC)       Relevant Medications   losartan (COZAAR) 50 MG tablet       -Examined patient. -Re-Discussed and educated patient on diabetic foot care, especially with  regards to the vascular, neurological and musculoskeletal systems.  -Mechanically debrided all nails 1-5 bilateral using sterile nail nipper and filed with dremel without incident.  -Patient to return in 3 months for at risk foot care -Patient advised to call the office if any problems or questions arise in the meantime.   Landis Martins, DPM

## 2021-11-25 NOTE — Progress Notes (Signed)
Subjective:    Patient ID: Alfred Foster, male    DOB: 1946/08/07, 75 y.o.   MRN: [id number]  This visit occurred during the SARS-CoV-2 public health emergency.  Safety protocols were in place, including screening questions prior to the visit, additional usage of staff PPE, and extensive cleaning of exam room while observing appropriate contact time as indicated for disinfecting solutions.     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  He now has pain again in his left leg on the lateral lower leg and sometimes into his toes.  It is intermittent.  He it taking tylenol.    He has DOE.  This is not new.  His wife is concerned about it because she does see shortness of breath.  He does not feel that it has gotten worse.  He denies chest pain.  Medications and allergies reviewed with patient and updated if appropriate.  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 90 tablet 3   Ascorbic Acid (VITAMIN C PO) Take 1 tablet by mouth daily.     aspirin 81 MG tablet Take 81 mg by mouth daily.     Cholecalciferol  (VITAMIN D3) 1000 UNITS CAPS Take 1,000 Units by mouth daily.     dapagliflozin propanediol (FARXIGA) 10 MG TABS tablet Take 1 tablet (10 mg total) by mouth daily before breakfast. 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  Dx: I82.461 2 each 0   ELIQUIS 5 MG TABS tablet TAKE 1 TABLET(5 MG) BY MOUTH TWICE DAILY 650 tablet 2   folic acid (FOLVITE) 354 MCG tablet Take 800 mcg by mouth daily.     glimepiride (AMARYL) 1 MG tablet TAKE 1 TABLET(1 MG) BY MOUTH DAILY WITH BREAKFAST 30 tablet 5   Ipratropium-Albuterol (COMBIVENT) 20-100 MCG/ACT AERS respimat Inhale 1 puff into the lungs every 6 (six) hours as needed for wheezing or shortness of breath. 4 g 0   Lancets (ONETOUCH ULTRASOFT) lancets use as directed 100 each 1   losartan (COZAAR) 100 MG tablet Take 1 tablet (100 mg total) by mouth daily. 90 tablet 1   metoprolol succinate (TOPROL-XL) 100 MG 24 hr tablet Take 1 tablet (100 mg total) by mouth daily. Take with or immediately following a meal. 90 tablet 3   niacin 500 MG tablet Take 500 mg by mouth daily.      Omega-3 Fatty Acids (FISH OIL PO) Take 1,000 mg by mouth daily.     ONE TOUCH ULTRA TEST test strip use as  directed 100 each 1   rosuvastatin (CRESTOR) 10 MG tablet TAKE 1 TABLET(10 MG) BY MOUTH DAILY 90 tablet 1   tiZANidine (ZANAFLEX) 2 MG tablet TAKE 1 TABLET(2 MG) BY MOUTH AT BEDTIME AS NEEDED FOR MUSCLE SPASMS 30 tablet 0   traMADol (ULTRAM) 50 MG tablet Take 1 tablet (50 mg total) by mouth every 8 (eight) hours as needed for severe pain. 15 tablet 0   No current facility-administered medications on file prior to visit.    Past Medical History:  Diagnosis Date   Diabetes mellitus    Hyperlipidemia    Hypertension    Prostate cancer [facility]) 2007   Dr Alinda Money    Past Surgical History:  Procedure Laterality Date   COLONOSCOPY  2004 & 2014   Negative ;[facility]   INGUINAL HERNIA REPAIR     PROSTATECTOMY   04/2006   Dr Alinda Money   UMBILICAL HERNIA REPAIR      Social History   Socioeconomic History   Marital status: Married    Spouse name: Not on file   Number of children: Not on file   Years of education: Not on file   Highest education level: Not on file  Occupational History   Not on file  Tobacco Use   Smoking status: Never   Smokeless tobacco: Never  Substance and Sexual Activity   Alcohol use: No   Drug use: No   Sexual activity: Not on file  Other Topics Concern   Not on file  Social History Narrative   Not on file   Social Determinants of Health   Financial Resource Strain: Not on file  Food Insecurity: Not on file  Transportation Needs: Not on file  Physical Activity: Not on file  Stress: Not on file  Social Connections: Not on file    Family History  Problem Relation Age of Onset   Diabetes Father    Hypertension Father    Alzheimer's disease Father    Hypertension Mother    Alzheimer's disease Mother    Heart attack Mother 90   Diabetes Brother    Heart disease Brother    Heart attack Maternal Aunt        in 8s   Stroke Neg Hx     Review of Systems  Constitutional:  Negative for fever.  Respiratory:  Positive for shortness of breath (DOE). Negative for cough and wheezing.   Cardiovascular:  Negative for chest pain, palpitations and leg swelling.  Neurological:  Negative for light-headedness and headaches.      Objective:   Vitals:   11/26/21 1305  BP: (!) 148/88  Pulse: 78  Temp: 98.1 F (36.7 C)  SpO2: 99%   BP Readings from Last 3 Encounters:  11/26/21 (!) 148/88  08/27/21 (!) 140/98  08/27/21 (!) 140/98   Wt Readings from Last 3 Encounters:  11/26/21 215 lb (97.5 kg)  08/27/21 209 lb 9.6 oz (95.1 kg)  08/27/21 209 lb (94.8 kg)   Body mass index is 28.37 kg/m.   Physical Exam    Constitutional: Appears well-developed and well-nourished. No distress.  HENT:  Head: Normocephalic and atraumatic.  Neck: Neck supple. No tracheal  deviation present. No thyromegaly present.  No cervical lymphadenopathy Cardiovascular: Normal rate, regular rhythm and normal heart sounds.   No murmur heard. No carotid bruit .  No edema Pulmonary/Chest: Effort normal and breath sounds normal. No respiratory distress. No has no wheezes. No rales.  Skin: Skin is warm and dry. Not diaphoretic.  Psychiatric: Normal mood and affect. Behavior is normal.      Assessment & Plan:    See Problem List for Assessment and Plan of chronic medical problems.

## 2021-11-25 NOTE — Patient Instructions (Addendum)
°  Blood work was ordered.     Medications changes include :   gabapentin 100 mg at bedtime, increase metoprolol xl 150 mg daily  Your prescription(s) have been submitted to your pharmacy. Please take as directed and contact our office if you believe you are having problem(s) with the medication(s).   A ultrasound of your carotid arteries was ordered.  Someone will call you to schedule an appointment.    A referral was ordered for cardiology  - they will call you.     Please followup in 3 months

## 2021-11-26 ENCOUNTER — Ambulatory Visit (INDEPENDENT_AMBULATORY_CARE_PROVIDER_SITE_OTHER): Payer: Medicare Other | Admitting: Internal Medicine

## 2021-11-26 ENCOUNTER — Encounter: Payer: Self-pay | Admitting: Internal Medicine

## 2021-11-26 ENCOUNTER — Other Ambulatory Visit: Payer: Self-pay

## 2021-11-26 ENCOUNTER — Ambulatory Visit: Payer: Medicare Other

## 2021-11-26 ENCOUNTER — Ambulatory Visit: Payer: Medicare Other | Admitting: Internal Medicine

## 2021-11-26 VITALS — BP 148/88 | HR 78 | Temp 98.1°F | Ht 73.0 in | Wt 215.0 lb

## 2021-11-26 DIAGNOSIS — I82511 Chronic embolism and thrombosis of right femoral vein: Secondary | ICD-10-CM

## 2021-11-26 DIAGNOSIS — E782 Mixed hyperlipidemia: Secondary | ICD-10-CM | POA: Diagnosis not present

## 2021-11-26 DIAGNOSIS — R0609 Other forms of dyspnea: Secondary | ICD-10-CM | POA: Insufficient documentation

## 2021-11-26 DIAGNOSIS — E1165 Type 2 diabetes mellitus with hyperglycemia: Secondary | ICD-10-CM

## 2021-11-26 DIAGNOSIS — M5416 Radiculopathy, lumbar region: Secondary | ICD-10-CM | POA: Diagnosis not present

## 2021-11-26 DIAGNOSIS — D6869 Other thrombophilia: Secondary | ICD-10-CM | POA: Diagnosis not present

## 2021-11-26 DIAGNOSIS — I6523 Occlusion and stenosis of bilateral carotid arteries: Secondary | ICD-10-CM | POA: Diagnosis not present

## 2021-11-26 DIAGNOSIS — R0602 Shortness of breath: Secondary | ICD-10-CM | POA: Insufficient documentation

## 2021-11-26 DIAGNOSIS — I10 Essential (primary) hypertension: Secondary | ICD-10-CM

## 2021-11-26 LAB — COMPREHENSIVE METABOLIC PANEL
ALT: 14 U/L (ref 0–53)
AST: 16 U/L (ref 0–37)
Albumin: 4.2 g/dL (ref 3.5–5.2)
Alkaline Phosphatase: 84 U/L (ref 39–117)
BUN: 25 mg/dL — ABNORMAL HIGH (ref 6–23)
CO2: 25 mEq/L (ref 19–32)
Calcium: 9.4 mg/dL (ref 8.4–10.5)
Chloride: 110 mEq/L (ref 96–112)
Creatinine, Ser: 1.42 mg/dL (ref 0.40–1.50)
GFR: 48.72 mL/min — ABNORMAL LOW (ref >60.00)
Glucose, Bld: 64 mg/dL — ABNORMAL LOW (ref 70–99)
Potassium: 3.8 mEq/L (ref 3.5–5.1)
Sodium: 144 mEq/L (ref 135–145)
Total Bilirubin: 0.7 mg/dL (ref 0.2–1.2)
Total Protein: 7.2 g/dL (ref 6.0–8.3)

## 2021-11-26 LAB — LIPID PANEL
Cholesterol: 116 mg/dL (ref 0–200)
HDL: 38.3 mg/dL — ABNORMAL LOW (ref >39.00)
LDL Cholesterol: 63 mg/dL (ref 0–99)
NonHDL: 77.41
Total CHOL/HDL Ratio: 3
Triglycerides: 74 mg/dL (ref 0.0–149.0)
VLDL: 14.8 mg/dL (ref 0.0–40.0)

## 2021-11-26 LAB — CBC WITH DIFFERENTIAL/PLATELET
Basophils Absolute: 0 10*3/uL (ref 0.0–0.1)
Basophils Relative: 0.7 % (ref 0.0–3.0)
Eosinophils Absolute: 0.2 10*3/uL (ref 0.0–0.7)
Eosinophils Relative: 3.9 % (ref 0.0–5.0)
HCT: 41.9 % (ref 39.0–52.0)
Hemoglobin: 13.9 g/dL (ref 13.0–17.0)
Lymphocytes Relative: 36.1 % (ref 12.0–46.0)
Lymphs Abs: 2 10*3/uL (ref 0.7–4.0)
MCHC: 33.1 g/dL (ref 30.0–36.0)
MCV: 95.8 fl (ref 78.0–100.0)
Monocytes Absolute: 0.7 10*3/uL (ref 0.1–1.0)
Monocytes Relative: 12.7 % — ABNORMAL HIGH (ref 3.0–12.0)
Neutro Abs: 2.6 10*3/uL (ref 1.4–7.7)
Neutrophils Relative %: 46.6 % (ref 43.0–77.0)
Platelets: 238 10*3/uL (ref 150.0–400.0)
RBC: 4.38 Mil/uL (ref 4.22–5.81)
RDW: 14.1 % (ref 11.5–15.5)
WBC: 5.6 10*3/uL (ref 4.0–10.5)

## 2021-11-26 LAB — HEMOGLOBIN A1C: Hgb A1c MFr Bld: 6.5 % (ref 4.6–6.5)

## 2021-11-26 MED ORDER — GABAPENTIN 100 MG PO CAPS: 100.0000 mg | ORAL_CAPSULE | Freq: Every day | ORAL | 5 refills | Status: DC

## 2021-11-26 MED ORDER — METOPROLOL SUCCINATE ER 100 MG PO TB24: 150.0000 mg | ORAL_TABLET | Freq: Every day | ORAL | 3 refills | Status: DC

## 2021-11-26 NOTE — Assessment & Plan Note (Signed)
Chronic History of DVT x2 Continue Eliquis 5 mg twice daily

## 2021-11-26 NOTE — Assessment & Plan Note (Signed)
Chronic Will order carotid ultrasound to reevaluate carotid disease Continue rosuvastatin 10 mg daily, aspirin 81 mg daily

## 2021-11-26 NOTE — Assessment & Plan Note (Signed)
Chronic Lab Results  Component Value Date   HGBA1C 6.8 (H) 08/27/2021   Controlled Check A1c today Stressed diabetic diet and regular exercise Continue glimepiride 1 mg daily and Farxiga 10 mg daily

## 2021-11-26 NOTE — Assessment & Plan Note (Signed)
Sounds more chronic than acute No chest pain or palpitations ?  Related to deconditioning, being overweight or the activity that is doing He has several risk factors for CAD and will refer to cardiology to rule out cardiac cause

## 2021-11-26 NOTE — Assessment & Plan Note (Signed)
Acute on chronic Left sided Has had two injection in the recent past Start gabapentin 100 mg hs - can titrate up as tolerated Return to sports medicine

## 2021-11-26 NOTE — Assessment & Plan Note (Addendum)
Chronic Blood pressure not well controlled CMP Continue amlodipine 10 mg daily, losartan 100 mg daily, increase metoprolol XL to 150 mg daily

## 2021-11-26 NOTE — Assessment & Plan Note (Signed)
Chronic History of DVT x2 On lifelong anticoagulation Continue Eliquis 5 mg twice daily Denies side effects Continue compression socks daily CBC, CMP

## 2021-11-26 NOTE — Assessment & Plan Note (Signed)
Chronic Regular exercise and healthy diet encouraged Check lipid panel  Continue rosuvastatin 10 mg daily 

## 2021-11-30 ENCOUNTER — Other Ambulatory Visit: Payer: Self-pay | Admitting: Family Medicine

## 2021-11-30 NOTE — Telephone Encounter (Signed)
Rx refill request approved per Dr. Corey's orders. 

## 2021-12-09 DIAGNOSIS — H25011 Cortical age-related cataract, right eye: Secondary | ICD-10-CM | POA: Diagnosis not present

## 2021-12-09 DIAGNOSIS — H524 Presbyopia: Secondary | ICD-10-CM | POA: Diagnosis not present

## 2021-12-09 DIAGNOSIS — H2513 Age-related nuclear cataract, bilateral: Secondary | ICD-10-CM | POA: Diagnosis not present

## 2021-12-09 DIAGNOSIS — E119 Type 2 diabetes mellitus without complications: Secondary | ICD-10-CM | POA: Diagnosis not present

## 2021-12-09 LAB — HM DIABETES EYE EXAM

## 2021-12-16 ENCOUNTER — Other Ambulatory Visit: Payer: Self-pay | Admitting: Internal Medicine

## 2021-12-16 ENCOUNTER — Encounter (HOSPITAL_COMMUNITY): Payer: Medicare Other

## 2021-12-17 ENCOUNTER — Ambulatory Visit (HOSPITAL_COMMUNITY)
Admission: RE | Admit: 2021-12-17 | Discharge: 2021-12-17 | Disposition: A | Discharge status: Home / Self Care | Payer: Medicare Other | Source: Ambulatory Visit | Attending: Cardiology | Admitting: Cardiology

## 2021-12-17 ENCOUNTER — Other Ambulatory Visit: Payer: Self-pay

## 2021-12-17 DIAGNOSIS — I6523 Occlusion and stenosis of bilateral carotid arteries: Secondary | ICD-10-CM | POA: Diagnosis not present

## 2021-12-23 NOTE — Progress Notes (Signed)
Cardiology Office Note   Date:  12/24/2021   ID:  Alfred Foster, DOB November 17, 1946, MRN [id number]  PCP:  Binnie Rail, MD  Cardiologist:   None Referring:  Binnie Rail, MD  Chief Complaint  Patient presents with   Shortness of Breath      History of Present Illness: Alfred Foster is a 76 y.o. male who presents for evaluation of SOB.  He was referred by Binnie Rail, MD.  The patient reported some shortness of breath with activity when he saw Dr. Quay Burow recently.  However, he says to me that he is able to do some treadmill exercising and some other exercising like jumping jacks and might get a little bit short of breath.  However, he is not having any chest pressure, neck or arm discomfort.  He does not really notice any palpitations, presyncope or syncope.  He does have shortness of breath sometimes bending over.  He will get dizzy at times when he stands up but has not had any passing out.  He has multiple cardiovascular risk factors but has not had prior cardiovascular testing.   Past Medical History:  Diagnosis Date   Diabetes mellitus    Hyperlipidemia    Hypertension    Prostate cancer [facility]) 2007   Dr Alinda Money    Past Surgical History:  Procedure Laterality Date   COLONOSCOPY  2004 & 2014   Negative ;[facility]   INGUINAL HERNIA REPAIR     PROSTATECTOMY  04/2006   Dr Alinda Money   UMBILICAL HERNIA REPAIR       Current Outpatient Medications  Medication Sig Dispense Refill   amLODipine (NORVASC) 10 MG tablet Take 1 tablet (10 mg total) by mouth daily. 90 tablet 3   apixaban (ELIQUIS) 2.5 MG TABS tablet Take 1 tablet (2.5 mg total) by mouth 2 (two) times daily. 180 tablet 3   Ascorbic Acid (VITAMIN C PO) Take 1 tablet by mouth daily.     aspirin 81 MG tablet Take 81 mg by mouth daily.     Cholecalciferol (VITAMIN D3) 1000 UNITS CAPS Take 1,000 Units by mouth daily.     dapagliflozin propanediol (FARXIGA) 10 MG TABS tablet Take 1 tablet (10 mg total) by mouth daily  before breakfast. 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  Dx: K35.465 2 each 0   folic acid (FOLVITE) 681 MCG tablet Take 800 mcg by mouth daily.     gabapentin (NEURONTIN) 100 MG capsule Take 1 capsule (100 mg total) by mouth at bedtime. 30 capsule 5   glimepiride (AMARYL) 1 MG tablet TAKE 1 TABLET(1 MG) BY MOUTH DAILY WITH BREAKFAST 30 tablet 5   Ipratropium-Albuterol (COMBIVENT) 20-100 MCG/ACT AERS respimat Inhale 1 puff into the lungs every 6 (six) hours as needed for wheezing or shortness of breath. 4 g 0   Lancets (ONETOUCH ULTRASOFT) lancets use as directed 100 each 1   losartan (COZAAR) 100 MG tablet Take 1 tablet (100 mg total) by mouth daily. 90 tablet 1   metoprolol (TOPROL XL) 200 MG 24 hr tablet Take 1 tablet (200 mg total) by mouth daily. 90 tablet 3   niacin 500 MG tablet Take 500 mg by mouth daily.      Omega-3 Fatty Acids (FISH OIL PO) Take 1,000 mg by mouth daily.     ONE TOUCH ULTRA TEST test strip use as directed 100 each 1  rosuvastatin (CRESTOR) 10 MG tablet TAKE 1 TABLET(10 MG) BY MOUTH DAILY 90 tablet 1   tiZANidine (ZANAFLEX) 2 MG tablet TAKE 1 TABLET(2 MG) BY MOUTH AT BEDTIME AS NEEDED FOR MUSCLE SPASMS 30 tablet 0   traMADol (ULTRAM) 50 MG tablet Take 1 tablet (50 mg total) by mouth every 8 (eight) hours as needed for severe pain. 15 tablet 0   No current facility-administered medications for this visit.    Allergies:   Hydrochlorothiazide and Metformin and related    Social History:  The patient  reports that he has never smoked. He has never used smokeless tobacco. He reports that he does not drink alcohol and does not use drugs.   Family History:  The patient's family history includes Alzheimer's disease in his father and mother; Diabetes in his brother and father; Heart attack in his maternal aunt; Heart attack (age of onset: 72) in his mother; Heart disease in his brother; Hypertension in  his father and mother.    ROS:  Please see the history of present illness.   Otherwise, review of systems are positive for none.   All other systems are reviewed and negative.    PHYSICAL EXAM: VS:  BP (!) 164/106    Pulse 69    Ht 6\' 1"  (1.854 m)    Wt 216 lb 6.4 oz (98.2 kg)    SpO2 99%    BMI 28.55 kg/m  , BMI Body mass index is 28.55 kg/m. GENERAL:  Well appearing HEENT:  Pupils equal round and reactive, fundi not visualized, oral mucosa unremarkable NECK:  No jugular venous distention, waveform within normal limits, carotid upstroke brisk and symmetric, no bruits, no thyromegaly LYMPHATICS:  No cervical, inguinal adenopathy LUNGS:  Clear to auscultation bilaterally BACK:  No CVA tenderness CHEST:  Unremarkable HEART:  PMI not displaced or sustained,S1 and S2 within normal limits, no S3, no S4, no clicks, no rubs, no murmurs ABD:  Flat, positive bowel sounds normal in frequency in pitch, no bruits, no rebound, no guarding, no midline pulsatile mass, no hepatomegaly, no splenomegaly EXT:  2 plus pulses throughout, no edema, no cyanosis no clubbing SKIN:  No rashes no nodules NEURO:  Cranial nerves II through XII grossly intact, motor grossly intact throughout PSYCH:  Cognitively intact, oriented to person place and time    EKG:  EKG is ordered today. The ekg ordered today demonstrates sinus rhythm, rate 69, axis within normal limits, intervals within normal limits, premature ectopic laxity with, no acute ST-T wave changes.   Recent Labs: 11/26/2021: ALT 14; BUN 25; Creatinine, Ser 1.42; Hemoglobin 13.9; Platelets 238.0; Potassium 3.8; Sodium 144    Lipid Panel    Component Value Date/Time   CHOL 116 11/26/2021 1406   TRIG 74.0 11/26/2021 1406   HDL 38.30 (L) 11/26/2021 1406   CHOLHDL 3 11/26/2021 1406   VLDL 14.8 11/26/2021 1406   LDLCALC 63 11/26/2021 1406      Wt Readings from Last 3 Encounters:  12/24/21 216 lb 6.4 oz (98.2 kg)  11/26/21 215 lb (97.5 kg)   08/27/21 209 lb 9.6 oz (95.1 kg)      Other studies Reviewed: Additional studies/ records that were reviewed today include: Labs and primary care office records. Review of the above records demonstrates:  Please see elsewhere in the note.     ASSESSMENT AND PLAN:  SOB: Because of his risk factors I would like to bring him back for a POET (Plain Old Exercise Treadmill).  He also  needs continued primary risk reduction.  DM: A1c is well controlled at 6.5.  Continue meds as listed.  DYSLIPIDEMIA: His LDL is 63 which is at target.  No change in therapy.  HTN: He did just have his beta-blocker increased but his blood pressure still does not look to be at target.  I will increase him to 100 mg of Toprol daily.  DVT: I noted that he was on 5 mg twice daily of Eliquis.  He has been on this for a few years and I went back and saw that he had an unprovoked DVT.  I am going to switch him to maintenance therapy 2.5 twice daily Eliquis    Current medicines are reviewed at length with the patient today.  The patient does not have concerns regarding medicines.  The following changes have been made:  no change  Labs/ tests ordered today include:   Orders Placed This Encounter  Procedures   Exercise Tolerance Test   EKG 12-Lead     Disposition:   FU with me as needed.      Signed, Minus Breeding, MD  12/24/2021 10:55 AM    [facility]

## 2021-12-24 ENCOUNTER — Other Ambulatory Visit: Payer: Self-pay

## 2021-12-24 ENCOUNTER — Encounter: Payer: Self-pay | Admitting: Cardiology

## 2021-12-24 ENCOUNTER — Ambulatory Visit: Payer: Medicare Other | Admitting: Cardiology

## 2021-12-24 VITALS — BP 164/106 | HR 69 | Ht 73.0 in | Wt 216.4 lb

## 2021-12-24 DIAGNOSIS — E118 Type 2 diabetes mellitus with unspecified complications: Secondary | ICD-10-CM | POA: Diagnosis not present

## 2021-12-24 DIAGNOSIS — E785 Hyperlipidemia, unspecified: Secondary | ICD-10-CM | POA: Diagnosis not present

## 2021-12-24 DIAGNOSIS — I10 Essential (primary) hypertension: Secondary | ICD-10-CM | POA: Diagnosis not present

## 2021-12-24 DIAGNOSIS — R0602 Shortness of breath: Secondary | ICD-10-CM

## 2021-12-24 MED ORDER — METOPROLOL SUCCINATE ER 200 MG PO TB24: 200.0000 mg | ORAL_TABLET | Freq: Every day | ORAL | 3 refills | Status: DC

## 2021-12-24 MED ORDER — APIXABAN 2.5 MG PO TABS: 2.5000 mg | ORAL_TABLET | Freq: Two times a day (BID) | ORAL | 3 refills | Status: DC

## 2021-12-24 NOTE — Patient Instructions (Addendum)
Medication Instructions:  Increase Metoprolol to 200 mg daily Decrease Eliquis to 2.5 mg twice a day  Continue all other medications   Lab Work: None ordered   Testing/Procedures: Schedule Treadmill  ( POET )   Follow-Up: At [facility], you and your health needs are our priority.  As part of our continuing mission to provide you with exceptional heart care, we have created designated Provider Care Teams.  These Care Teams include your primary Cardiologist (physician) and Advanced Practice Providers (APPs -  Physician Assistants and Nurse Practitioners) who all work together to provide you with the care you need, when you need it.  We recommend signing up for the patient portal called "MyChart".  Sign up information is provided on this After Visit Summary.  MyChart is used to connect with patients for Virtual Visits (Telemedicine).  Patients are able to view lab/test results, encounter notes, upcoming appointments, etc.  Non-urgent messages can be sent to your provider as well.   To learn more about what you can do with MyChart, go to [email or web address].      Your next appointment:  As Needed    The format for your next appointment: Office   Provider:  Dr.Hochrein

## 2021-12-30 ENCOUNTER — Telehealth (HOSPITAL_COMMUNITY): Payer: Self-pay | Admitting: *Deleted

## 2021-12-30 NOTE — Telephone Encounter (Signed)
Close encounter 

## 2021-12-31 ENCOUNTER — Ambulatory Visit (HOSPITAL_COMMUNITY)
Admission: RE | Admit: 2021-12-31 | Discharge: 2021-12-31 | Disposition: A | Discharge status: Home / Self Care | Payer: Medicare Other | Source: Ambulatory Visit | Attending: Cardiology | Admitting: Cardiology

## 2021-12-31 ENCOUNTER — Encounter (HOSPITAL_COMMUNITY): Payer: Self-pay | Admitting: *Deleted

## 2021-12-31 ENCOUNTER — Other Ambulatory Visit: Payer: Self-pay

## 2021-12-31 DIAGNOSIS — I10 Essential (primary) hypertension: Secondary | ICD-10-CM | POA: Insufficient documentation

## 2021-12-31 DIAGNOSIS — E785 Hyperlipidemia, unspecified: Secondary | ICD-10-CM | POA: Diagnosis not present

## 2021-12-31 DIAGNOSIS — E118 Type 2 diabetes mellitus with unspecified complications: Secondary | ICD-10-CM

## 2021-12-31 DIAGNOSIS — R0602 Shortness of breath: Secondary | ICD-10-CM | POA: Insufficient documentation

## 2021-12-31 LAB — EXERCISE TOLERANCE TEST
Angina Index: 0
Duke Treadmill Score: 9
Estimated workload: 10.4
Exercise duration (min): 9 min
Exercise duration (sec): 0 s
MPHR: 146 {beats}/min
Peak HR: 171 {beats}/min
Percent HR: 117 %
Rest HR: 77 {beats}/min
ST Depression (mm): 0 mm

## 2021-12-31 NOTE — Progress Notes (Signed)
Dr Percival Spanish reviewed ETT and said pt may leave.

## 2022-01-03 ENCOUNTER — Encounter: Payer: Self-pay | Admitting: *Deleted

## 2022-01-24 ENCOUNTER — Encounter: Payer: Self-pay | Admitting: Internal Medicine

## 2022-01-24 NOTE — Progress Notes (Signed)
Outside notes received. Information abstracted. Notes sent to scan.  

## 2022-02-17 ENCOUNTER — Other Ambulatory Visit: Payer: Self-pay

## 2022-02-17 ENCOUNTER — Ambulatory Visit: Payer: Medicare Other

## 2022-02-17 ENCOUNTER — Telehealth: Payer: Self-pay

## 2022-02-17 ENCOUNTER — Encounter: Payer: Self-pay | Admitting: Sports Medicine

## 2022-02-17 ENCOUNTER — Ambulatory Visit (INDEPENDENT_AMBULATORY_CARE_PROVIDER_SITE_OTHER): Payer: Medicare Other | Admitting: Sports Medicine

## 2022-02-17 DIAGNOSIS — M2041 Other hammer toe(s) (acquired), right foot: Secondary | ICD-10-CM

## 2022-02-17 DIAGNOSIS — L84 Corns and callosities: Secondary | ICD-10-CM

## 2022-02-17 DIAGNOSIS — M79674 Pain in right toe(s): Secondary | ICD-10-CM | POA: Diagnosis not present

## 2022-02-17 DIAGNOSIS — M79675 Pain in left toe(s): Secondary | ICD-10-CM

## 2022-02-17 DIAGNOSIS — M2141 Flat foot [pes planus] (acquired), right foot: Secondary | ICD-10-CM

## 2022-02-17 DIAGNOSIS — B351 Tinea unguium: Secondary | ICD-10-CM

## 2022-02-17 DIAGNOSIS — M2042 Other hammer toe(s) (acquired), left foot: Secondary | ICD-10-CM

## 2022-02-17 DIAGNOSIS — E119 Type 2 diabetes mellitus without complications: Secondary | ICD-10-CM

## 2022-02-17 DIAGNOSIS — M2142 Flat foot [pes planus] (acquired), left foot: Secondary | ICD-10-CM

## 2022-02-17 NOTE — Progress Notes (Signed)
SITUATION ?Reason for Consult: Evaluation for Prefabricated Diabetic Shoes and Custom Diabetic Inserts. ?Patient / Caregiver Report: Patient would like well fitting shoes ? ?OBJECTIVE DATA: ?Patient History / Diagnosis:  ?  ICD-10-CM   ?1. Controlled type 2 diabetes mellitus without complication, without long-term current use of insulin (HCC)  E11.9   ?  ?2. Corns  L84   ?  ? ? ?Current or Previous Devices:   Historical user ? ?In-Person Foot Examination: ?Ulcers & Callousing:   Historical ? ?Shoe Size: 11XW ? ?ORTHOTIC RECOMMENDATION ?Recommended Devices: ?- 1x pair prefabricated PDAC approved diabetic shoes; Patient Selected - Apex X801M Size 11XW ?- 3x pair custom-to-patient PDAC approved vacuum formed diabetic insoles. ? ?GOALS OF SHOES AND INSOLES ?- Reduce shear and pressure ?- Reduce / Prevent callus formation ?- Reduce / Prevent ulceration ?- Protect the fragile healing compromised diabetic foot. ? ?Patient would benefit from diabetic shoes and inserts as patient has diabetes mellitus and the patient has one or more of the following conditions: ?- History of partial or complete amputation of the foot ?- History of previous foot ulceration. ?- History of pre-ulcerative callus ?- Peripheral neuropathy with evidence of callus formation ?- Foot deformity ?- Poor circulation ? ?ACTIONS PERFORMED ?Patient was casted for insoles via crush box and measured for shoes via brannock device. Procedure was explained and patient tolerated procedure well. All questions were answered and concerns addressed. ? ?PLAN ?Patient is to be contacted and scheduled for fitting once CMN is obtained from treaing physician and shoes and insoles have been fabricated and received. ? ?

## 2022-02-17 NOTE — Telephone Encounter (Signed)
Casts Sent to [employer] - HOLD FOR CMN ?

## 2022-02-17 NOTE — Progress Notes (Signed)
Subjective: Alfred Foster is a 76 y.o. male patient with history of diabetes who returns to office today complaining of long, painful nails while ambulating in shoes; unable to trim.  Patient is also here for diabetic shoe measurements today.  FBS not recorded, a1c 6.5, Burns, Claudina Lick, MD, last visit January 2023 No other issues.  Patient is assisted by wife today who is also being seen today.  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 90 tablet 3   apixaban (ELIQUIS) 2.5 MG TABS tablet Take 1 tablet (2.5 mg total) by mouth 2 (two) times daily. 180 tablet 3   Ascorbic Acid (VITAMIN C PO) Take 1 tablet by mouth daily.     aspirin 81 MG tablet Take 81 mg by mouth daily.     Cholecalciferol (VITAMIN D3) 1000 UNITS CAPS Take 1,000 Units by mouth daily.     dapagliflozin propanediol (FARXIGA) 10 MG TABS tablet Take 1 tablet (10 mg total) by mouth daily before breakfast. 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  Dx: R51.884 2 each 0   folic acid (FOLVITE) 166 MCG tablet Take 800 mcg by mouth daily.     gabapentin (NEURONTIN) 100 MG capsule Take 1 capsule (100 mg total) by mouth at bedtime. 30 capsule 5    glimepiride (AMARYL) 1 MG tablet TAKE 1 TABLET(1 MG) BY MOUTH DAILY WITH BREAKFAST 30 tablet 5   Ipratropium-Albuterol (COMBIVENT) 20-100 MCG/ACT AERS respimat Inhale 1 puff into the lungs every 6 (six) hours as needed for wheezing or shortness of breath. 4 g 0   Lancets (ONETOUCH ULTRASOFT) lancets use as directed 100 each 1   losartan (COZAAR) 100 MG tablet Take 1 tablet (100 mg total) by mouth daily. 90 tablet 1   losartan (COZAAR) 50 MG tablet Take 50 mg by mouth daily.     metoprolol (TOPROL XL) 200 MG 24 hr tablet Take 1 tablet (200 mg total) by mouth daily. 90 tablet 3   niacin 500 MG tablet Take 500 mg by mouth daily.      Omega-3 Fatty Acids (FISH OIL PO) Take 1,000 mg by mouth daily.     ONE TOUCH ULTRA TEST test strip use as directed 100 each 1   rosuvastatin (CRESTOR) 10 MG tablet TAKE 1 TABLET(10 MG) BY MOUTH DAILY 90 tablet 1   tiZANidine (ZANAFLEX) 2 MG tablet TAKE 1 TABLET(2 MG) BY MOUTH AT BEDTIME AS NEEDED FOR MUSCLE SPASMS 30 tablet 0   traMADol (ULTRAM) 50 MG tablet Take 1 tablet (50 mg total) by mouth every 8 (eight) hours as needed for severe pain. 15 tablet 0   No current facility-administered medications on file prior to visit.   Allergies  Allergen Reactions  Hydrochlorothiazide     SOB, dizzy   Metformin And Related Other (See Comments)    Make him feel full and uncomfortable    Recent Results (from the past 2160 hour(s))  CBC with Differential/Platelet     Status: Abnormal   Collection Time: 11/26/21  2:06 PM  Result Value Ref Range   WBC 5.6 4.0 - 10.5 K/uL   RBC 4.38 4.22 - 5.81 Mil/uL   Hemoglobin 13.9 13.0 - 17.0 g/dL   HCT 41.9 39.0 - 52.0 %   MCV 95.8 78.0 - 100.0 fl   MCHC 33.1 30.0 - 36.0 g/dL   RDW 14.1 11.5 - 15.5 %   Platelets 238.0 150.0 - 400.0 K/uL   Neutrophils Relative % 46.6 43.0 - 77.0 %   Lymphocytes Relative 36.1 12.0 - 46.0 %   Monocytes Relative 12.7 (H) 3.0 - 12.0 %   Eosinophils Relative 3.9 0.0 - 5.0 %   Basophils Relative  0.7 0.0 - 3.0 %   Neutro Abs 2.6 1.4 - 7.7 K/uL   Lymphs Abs 2.0 0.7 - 4.0 K/uL   Monocytes Absolute 0.7 0.1 - 1.0 K/uL   Eosinophils Absolute 0.2 0.0 - 0.7 K/uL   Basophils Absolute 0.0 0.0 - 0.1 K/uL  Lipid panel     Status: Abnormal   Collection Time: 11/26/21  2:06 PM  Result Value Ref Range   Cholesterol 116 0 - 200 mg/dL    Comment: ATP III Classification       Desirable:  < 200 mg/dL               Borderline High:  200 - 239 mg/dL          High:  > = 240 mg/dL   Triglycerides 74.0 0.0 - 149.0 mg/dL    Comment: Normal:  <150 mg/dLBorderline High:  150 - 199 mg/dL   HDL 38.30 (L) >39.00 mg/dL   VLDL 14.8 0.0 - 40.0 mg/dL   LDL Cholesterol 63 0 - 99 mg/dL   Total CHOL/HDL Ratio 3     Comment:                Men          Women1/2 Average Risk     3.4          3.3Average Risk          5.0          4.42X Average Risk          9.6          7.13X Average Risk          15.0          11.0                       NonHDL 77.41     Comment: NOTE:  Non-HDL goal should be 30 mg/dL higher than patient's LDL goal (i.e. LDL goal of < 70 mg/dL, would have non-HDL goal of < 100 mg/dL)  Hemoglobin A1c     Status: None   Collection Time: 11/26/21  2:06 PM  Result Value Ref Range   Hgb A1c MFr Bld 6.5 4.6 - 6.5 %    Comment: Glycemic Control Guidelines for People with Diabetes:Non Diabetic:  <6%Goal of Therapy: <7%Additional Action Suggested:  >8%   Comprehensive metabolic panel     Status: Abnormal   Collection Time: 11/26/21  2:06 PM  Result Value Ref Range  Sodium 144 135 - 145 mEq/L   Potassium 3.8 3.5 - 5.1 mEq/L   Chloride 110 96 - 112 mEq/L   CO2 25 19 - 32 mEq/L   Glucose, Bld 64 (L) 70 - 99 mg/dL   BUN 25 (H) 6 - 23 mg/dL   Creatinine, Ser 1.42 0.40 - 1.50 mg/dL   Total Bilirubin 0.7 0.2 - 1.2 mg/dL   Alkaline Phosphatase 84 39 - 117 U/L   AST 16 0 - 37 U/L   ALT 14 0 - 53 U/L   Total Protein 7.2 6.0 - 8.3 g/dL   Albumin 4.2 3.5 - 5.2 g/dL   GFR 48.72 (L) >60.00 mL/min    Comment:  Calculated using the CKD-EPI Creatinine Equation (2021)   Calcium 9.4 8.4 - 10.5 mg/dL  HM DIABETES EYE EXAM     Status: None   Collection Time: 12/09/21 12:00 AM  Result Value Ref Range   HM Diabetic Eye Exam No Retinopathy No Retinopathy  Exercise Tolerance Test     Status: None   Collection Time: 12/31/21  3:58 PM  Result Value Ref Range   Rest HR 77.0 bpm   Rest BP 184/95 mmHg   Exercise duration (min) 9 min   Exercise duration (sec) 0 sec   Estimated workload 10.4    Peak HR 171 bpm   Peak BP 213/106 mmHg   MPHR 146 bpm   Percent HR 117.0 %   Angina Index 0    Duke Treadmill Score 9    ST Depression (mm) 0 mm    Objective: General: Patient is awake, alert, and oriented x 3 and in no acute distress.  Integument: Skin is warm, dry and supple bilateral. Nails are tender, long, thickened and dystrophic with subungual debris, consistent with onychomycosis, 1-5 bilateral. No signs of infection. No open lesions. Remaining integument unremarkable.  Vasculature:  Dorsalis Pedis pulse 2/4 bilateral. Posterior Tibial pulse  1/4 bilateral today. Capillary fill time <3 sec 1-5 bilateral. Positive hair growth to the level of the digits.Temperature gradient within normal limits. No varicosities present bilateral.  Neurology: Gross sensation present via light touch bilateral.   Musculoskeletal: Asymptomatic bunion, hammertoe, and planus foot type pedal deformities noted bilateral. Muscular strength 5/5 in all lower extremity muscular groups bilateral without pain on range of motion. No tenderness with calf compression bilateral.  Assessment and Plan: Problem List Items Addressed This Visit   None Visit Diagnoses     Pain due to onychomycosis of toenails of both feet    -  Primary   Controlled type 2 diabetes mellitus without complication, without long-term current use of insulin (HCC)       Relevant Medications   losartan (COZAAR) 50 MG tablet   [place]       Hammer toes of both feet        Pes planus of both feet           -Examined patient. -Re-Discussed and educated patient on diabetic foot care, especially with  regards to the vascular, neurological and musculoskeletal systems.  -Mechanically debrided all painful nails 1-5 bilateral using sterile nail nipper and filed with dremel without incident.  -Patient to see Aaron Edelman today for diabetic shoes for the year -Patient to return in 3 months for at risk foot care -Patient advised to call the office if any problems or questions arise in the meantime.   Landis Martins, DPM

## 2022-03-03 ENCOUNTER — Encounter: Payer: Self-pay | Admitting: Internal Medicine

## 2022-03-03 NOTE — Progress Notes (Signed)
? ? ? ? ?Subjective:  ? ? Patient ID: Alfred Foster, male    DOB: November 11, 1946, 76 y.o.   MRN: [id number] ? ?This visit occurred during the SARS-CoV-2 public health emergency.  Safety protocols were in place, including screening questions prior to the visit, additional usage of staff PPE, and extensive cleaning of exam room while observing appropriate contact time as indicated for disinfecting solutions.   ? ? ?HPI ?Leiam is here for follow up of his chronic medical problems, including DM, htn, hld, chronic dvt in RLE on eliquis ? ? ?Wrist cuff today at home 133/90 ? ?? Taking farxiga - he is not sure - has not been filled in a while.  ? ? ? ?Medications and allergies reviewed with patient and updated if appropriate. ? ?Current Outpatient Medications on File Prior to Visit  ?Medication Sig Dispense Refill  ? amLODipine (NORVASC) 10 MG tablet Take 1 tablet (10 mg total) by mouth daily. 90 tablet 3  ? apixaban (ELIQUIS) 2.5 MG TABS tablet Take 1 tablet (2.5 mg total) by mouth 2 (two) times daily. 180 tablet 3  ? Ascorbic Acid (VITAMIN C PO) Take 1 tablet by mouth daily.    ? aspirin 81 MG tablet Take 81 mg by mouth daily.    ? Cholecalciferol (VITAMIN D3) 1000 UNITS CAPS Take 1,000 Units by mouth daily.    ? dapagliflozin propanediol (FARXIGA) 10 MG TABS tablet Take 1 tablet (10 mg total) by mouth daily before breakfast. 90 tablet 1  ? 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  Dx: S50.539 2 each 0  ? folic acid (FOLVITE) 767 MCG tablet Take 800 mcg by mouth daily.    ? gabapentin (NEURONTIN) 100 MG capsule Take 1 capsule (100 mg total) by mouth at bedtime. 30 capsule 5  ? glimepiride (AMARYL) 1 MG tablet TAKE 1 TABLET(1 MG) BY MOUTH DAILY WITH BREAKFAST 30 tablet 5  ? Ipratropium-Albuterol (COMBIVENT) 20-100 MCG/ACT AERS respimat Inhale 1 puff into the lungs every 6 (six) hours as needed for wheezing or shortness of breath. 4 g 0  ? Lancets (ONETOUCH ULTRASOFT)  lancets use as directed 100 each 1  ? losartan (COZAAR) 100 MG tablet Take 1 tablet (100 mg total) by mouth daily. 90 tablet 1  ? metoprolol (TOPROL XL) 200 MG 24 hr tablet Take 1 tablet (200 mg total) by mouth daily. 90 tablet 3  ? niacin 500 MG tablet Take 500 mg by mouth daily.     ? Omega-3 Fatty Acids (FISH OIL PO) Take 1,000 mg by mouth daily.    ? ONE TOUCH ULTRA TEST test strip use as directed 100 each 1  ? rosuvastatin (CRESTOR) 10 MG tablet TAKE 1 TABLET(10 MG) BY MOUTH DAILY 90 tablet 1  ? tiZANidine (ZANAFLEX) 2 MG tablet TAKE 1 TABLET(2 MG) BY MOUTH AT BEDTIME AS NEEDED FOR MUSCLE SPASMS 30 tablet 0  ? traMADol (ULTRAM) 50 MG tablet Take 1 tablet (50 mg total) by mouth every 8 (eight) hours as needed for severe pain. 15 tablet 0  ? ?No current facility-administered medications on file prior to visit.  ? ? ? ?Review of Systems  ?Constitutional:  Negative for chills and fever.  ?Respiratory:  Negative for cough, shortness of breath and wheezing.   ?Cardiovascular:  Negative for chest pain, palpitations and leg swelling.  ?Neurological:  Negative for light-headedness and headaches.  ? ?   ?Objective:  ? ?Vitals:  ? 03/04/22  1256  ?BP: (!) 168/84  ?Pulse: 89  ?Temp: 98.2 ?F (36.8 ?C)  ?SpO2: 99%  ? ?BP Readings from Last 3 Encounters:  ?03/04/22 (!) 168/84  ?12/24/21 (!) 164/106  ?11/26/21 (!) 148/88  ? ?Wt Readings from Last 3 Encounters:  ?03/04/22 220 lb (99.8 kg)  ?12/24/21 216 lb 6.4 oz (98.2 kg)  ?11/26/21 215 lb (97.5 kg)  ? ?Body mass index is 29.03 kg/m?. ? ?  ?Physical Exam ?Constitutional:   ?   General: He is not in acute distress. ?   Appearance: Normal appearance. He is not ill-appearing.  ?HENT:  ?   Head: Normocephalic and atraumatic.  ?Eyes:  ?   Conjunctiva/sclera: Conjunctivae normal.  ?Cardiovascular:  ?   Rate and Rhythm: Normal rate and regular rhythm.  ?   Heart sounds: Normal heart sounds. No murmur heard. ?Pulmonary:  ?   Effort: Pulmonary effort is normal. No respiratory distress.   ?   Breath sounds: Normal breath sounds. No wheezing or rales.  ?Musculoskeletal:  ?   Right lower leg: No edema.  ?   Left lower leg: No edema.  ?Skin: ?   General: Skin is warm and dry.  ?   Findings: No rash.  ?Neurological:  ?   Mental Status: He is alert. Mental status is at baseline.  ?Psychiatric:     ?   Mood and Affect: Mood normal.  ? ?   ? ?Lab Results  ?Component Value Date  ? WBC 5.6 11/26/2021  ? HGB 13.9 11/26/2021  ? HCT 41.9 11/26/2021  ? PLT 238.0 11/26/2021  ? GLUCOSE 64 (L) 11/26/2021  ? CHOL 116 11/26/2021  ? TRIG 74.0 11/26/2021  ? HDL 38.30 (L) 11/26/2021  ? [place] 63 11/26/2021  ? ALT 14 11/26/2021  ? AST 16 11/26/2021  ? NA 144 11/26/2021  ? K 3.8 11/26/2021  ? CL 110 11/26/2021  ? CREATININE 1.42 11/26/2021  ? BUN 25 (H) 11/26/2021  ? CO2 25 11/26/2021  ? TSH 0.83 10/09/2020  ? PSA 0.00 (L) 01/10/2020  ? INR 1.01 12/14/2012  ? HGBA1C 6.5 11/26/2021  ? MICROALBUR 1.7 01/10/2020  ? ? ? ?Assessment & Plan:  ? ? ?See Problem List for Assessment and Plan of chronic medical problems.  ? ? ?

## 2022-03-03 NOTE — Patient Instructions (Addendum)
? ? ? ?  Blood work was ordered.   ? ? ?Medications changes include :   start spironolactone 12.5 mg daily ? ? ?Your prescription(s) have been sent to your pharmacy.  ? ? ? ? ?Return in about 4 weeks (around 04/01/2022) for 3-4 week follow up. ? ?

## 2022-03-04 ENCOUNTER — Ambulatory Visit (INDEPENDENT_AMBULATORY_CARE_PROVIDER_SITE_OTHER): Payer: Medicare Other | Admitting: Internal Medicine

## 2022-03-04 ENCOUNTER — Other Ambulatory Visit: Payer: Self-pay

## 2022-03-04 VITALS — BP 146/90 | HR 89 | Temp 98.2°F | Ht 73.0 in | Wt 220.0 lb

## 2022-03-04 DIAGNOSIS — E1165 Type 2 diabetes mellitus with hyperglycemia: Secondary | ICD-10-CM

## 2022-03-04 DIAGNOSIS — Z125 Encounter for screening for malignant neoplasm of prostate: Secondary | ICD-10-CM | POA: Diagnosis not present

## 2022-03-04 DIAGNOSIS — I10 Essential (primary) hypertension: Secondary | ICD-10-CM | POA: Diagnosis not present

## 2022-03-04 DIAGNOSIS — I82511 Chronic embolism and thrombosis of right femoral vein: Secondary | ICD-10-CM | POA: Diagnosis not present

## 2022-03-04 DIAGNOSIS — E782 Mixed hyperlipidemia: Secondary | ICD-10-CM

## 2022-03-04 LAB — CBC WITH DIFFERENTIAL/PLATELET
Basophils Absolute: 0 10*3/uL (ref 0.0–0.1)
Basophils Relative: 0.7 % (ref 0.0–3.0)
Eosinophils Absolute: 0.2 10*3/uL (ref 0.0–0.7)
Eosinophils Relative: 3.8 % (ref 0.0–5.0)
HCT: 42.3 % (ref 39.0–52.0)
Hemoglobin: 14.2 g/dL (ref 13.0–17.0)
Lymphocytes Relative: 33.1 % (ref 12.0–46.0)
Lymphs Abs: 2.1 10*3/uL (ref 0.7–4.0)
MCHC: 33.6 g/dL (ref 30.0–36.0)
MCV: 96.7 fl (ref 78.0–100.0)
Monocytes Absolute: 0.7 10*3/uL (ref 0.1–1.0)
Monocytes Relative: 10.8 % (ref 3.0–12.0)
Neutro Abs: 3.3 10*3/uL (ref 1.4–7.7)
Neutrophils Relative %: 51.6 % (ref 43.0–77.0)
Platelets: 223 10*3/uL (ref 150.0–400.0)
RBC: 4.38 Mil/uL (ref 4.22–5.81)
RDW: 14 % (ref 11.5–15.5)
WBC: 6.4 10*3/uL (ref 4.0–10.5)

## 2022-03-04 LAB — BASIC METABOLIC PANEL
BUN: 20 mg/dL (ref 6–23)
CO2: 26 mEq/L (ref 19–32)
Calcium: 9.1 mg/dL (ref 8.4–10.5)
Chloride: 111 mEq/L (ref 96–112)
Creatinine, Ser: 1.42 mg/dL (ref 0.40–1.50)
GFR: 48.63 mL/min — ABNORMAL LOW (ref >60.00)
Glucose, Bld: 95 mg/dL (ref 70–99)
Potassium: 4 mEq/L (ref 3.5–5.1)
Sodium: 143 mEq/L (ref 135–145)

## 2022-03-04 LAB — HEMOGLOBIN A1C: Hgb A1c MFr Bld: 6.9 % — ABNORMAL HIGH (ref 4.6–6.5)

## 2022-03-04 LAB — MICROALBUMIN / CREATININE URINE RATIO
Creatinine,U: 164.2 mg/dL
Microalb Creat Ratio: 1.2 mg/g (ref 0.0–30.0)
Microalb, Ur: 1.9 mg/dL (ref 0.0–1.9)

## 2022-03-04 MED ORDER — SPIRONOLACTONE 25 MG PO TABS: 12.5000 mg | ORAL_TABLET | Freq: Every day | ORAL | 5 refills | Status: DC

## 2022-03-04 NOTE — Assessment & Plan Note (Addendum)
Chronic ?Blood pressure not controlled ?CMP ?Continue amlodipine 10 mg daily, losartan 100 mg daily, metoprolol XL 200 mg daily ?Start spironolactone 12.5 mg daily ?F/u in 4 weeks ?

## 2022-03-04 NOTE — Assessment & Plan Note (Addendum)
Chronic ?Lab Results  ?Component Value Date  ? HGBA1C 6.5 11/26/2021  ? ?Sugars controlled ?Check A1c, urine microalbumin ?Continue glimepiride 1 mg daily, ? Taking Farxiga 10 mg daily - he will check at home ?Stressed regular exercise, diabetic diet ?Will adjust medication as needed ? ?

## 2022-03-04 NOTE — Assessment & Plan Note (Signed)
Chronic Regular exercise and healthy diet encouraged Check lipid panel  Continue rosuvastatin 10 mg daily 

## 2022-03-04 NOTE — Assessment & Plan Note (Signed)
Chronic ?DVT x2-requires lifelong anticoagulation ?Completed 6 months of treatment dose Eliquis ? ?Continue prophylactic dose 2.5 mg twice daily of Eliquis ?CBC, CMP ?

## 2022-03-08 LAB — PSA, MEDICARE: PSA: 0 ng/ml — ABNORMAL LOW (ref 0.10–4.00)

## 2022-03-09 ENCOUNTER — Other Ambulatory Visit: Payer: Self-pay

## 2022-03-09 MED ORDER — DAPAGLIFLOZIN PROPANEDIOL 10 MG PO TABS: 10.0000 mg | ORAL_TABLET | Freq: Every day | ORAL | 1 refills | Status: DC

## 2022-03-14 IMAGING — DX DG LUMBAR SPINE 2-3V
3 series · 3 of 3 positions shown · non-contrast
Comparison: None.

CLINICAL DATA: Left L5 radiculopathy, intermittent left leg pain

EXAM:
LUMBAR SPINE - 2-3 VIEW

[l-spine ap]
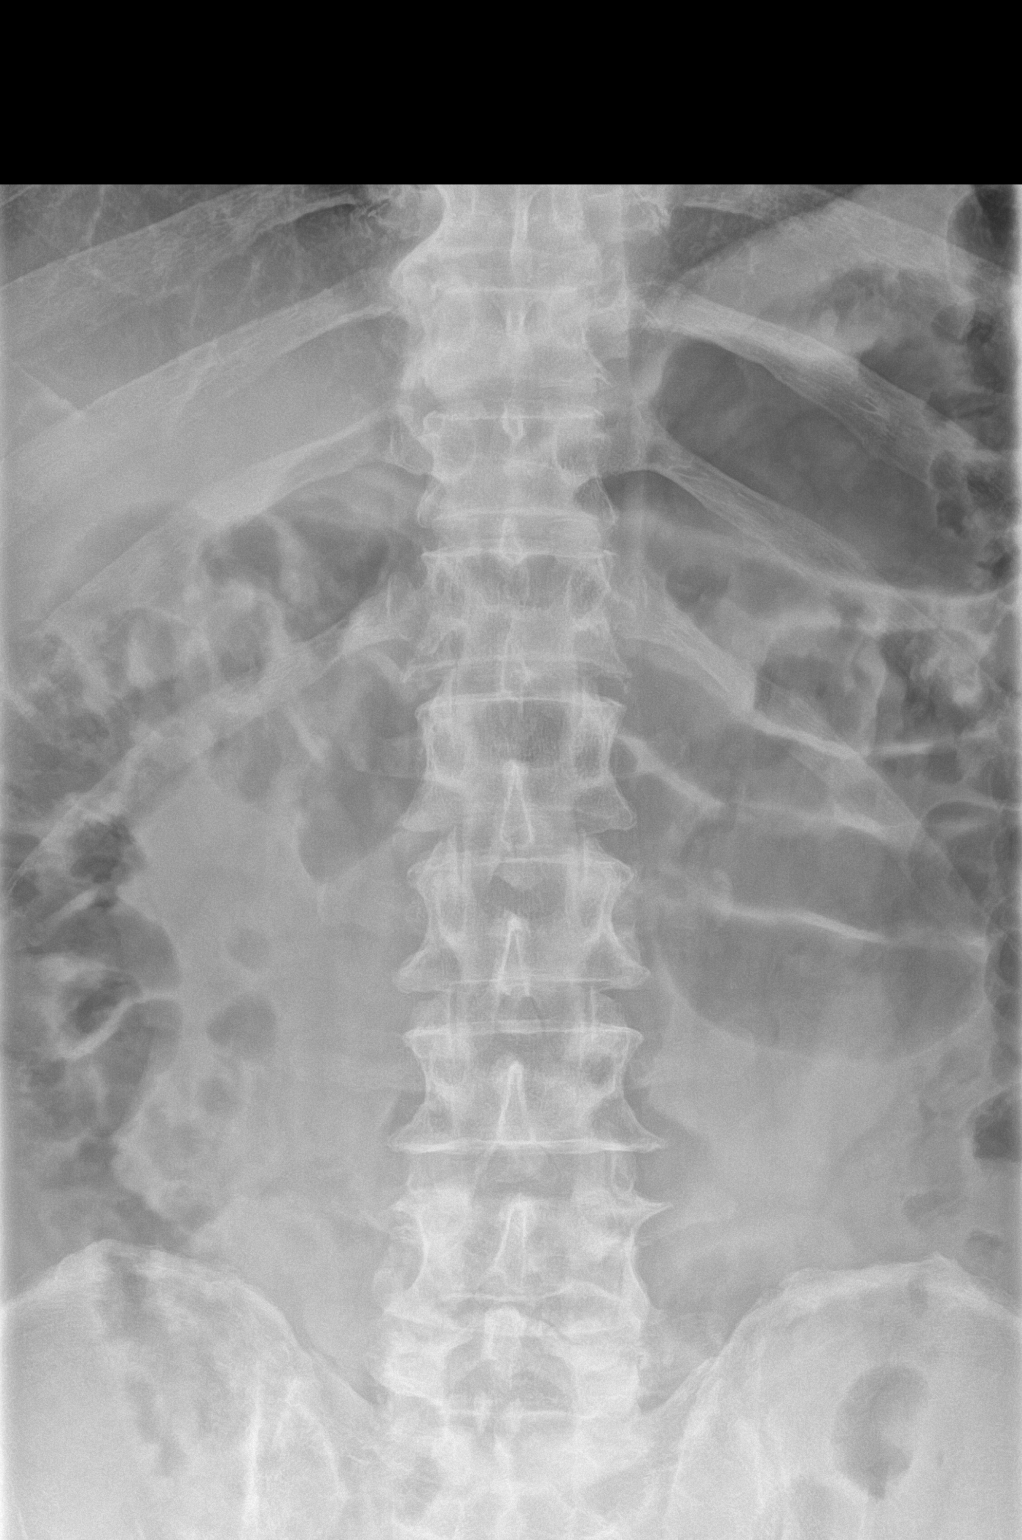

[l-spine lateral (1 of 2)]
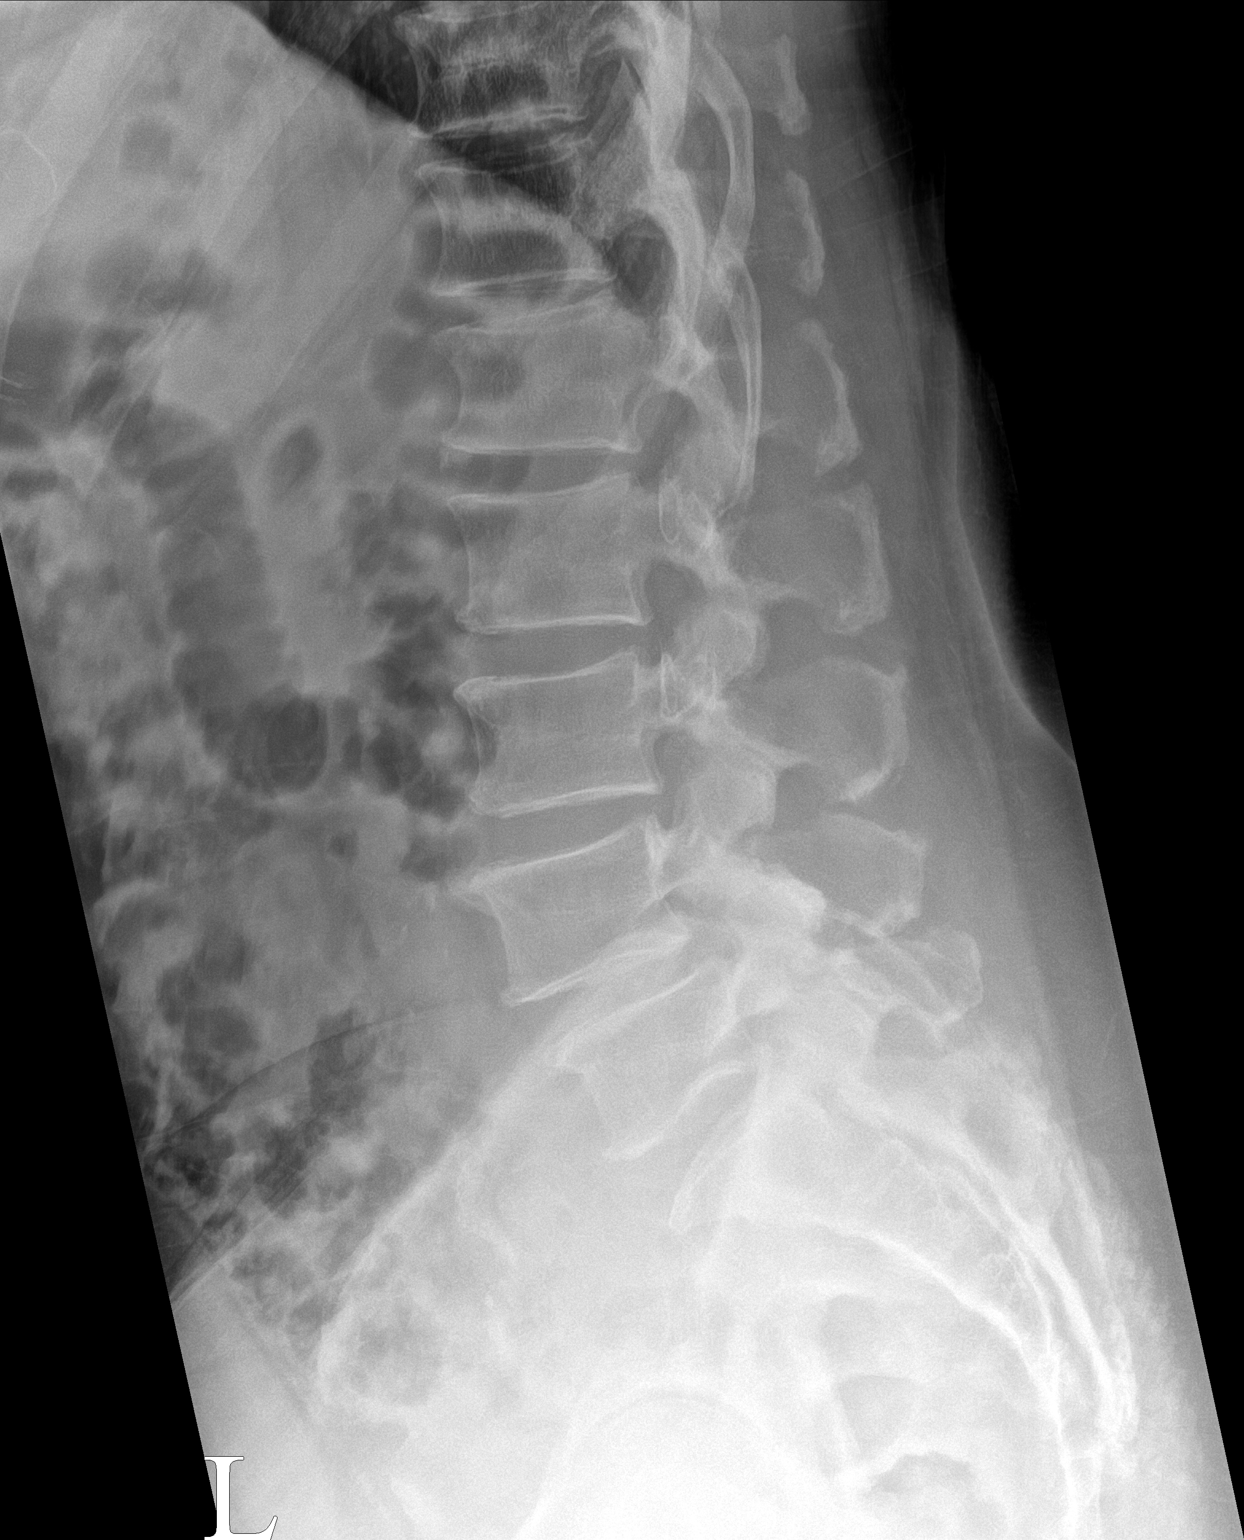

[l-spine lateral (2 of 2)]
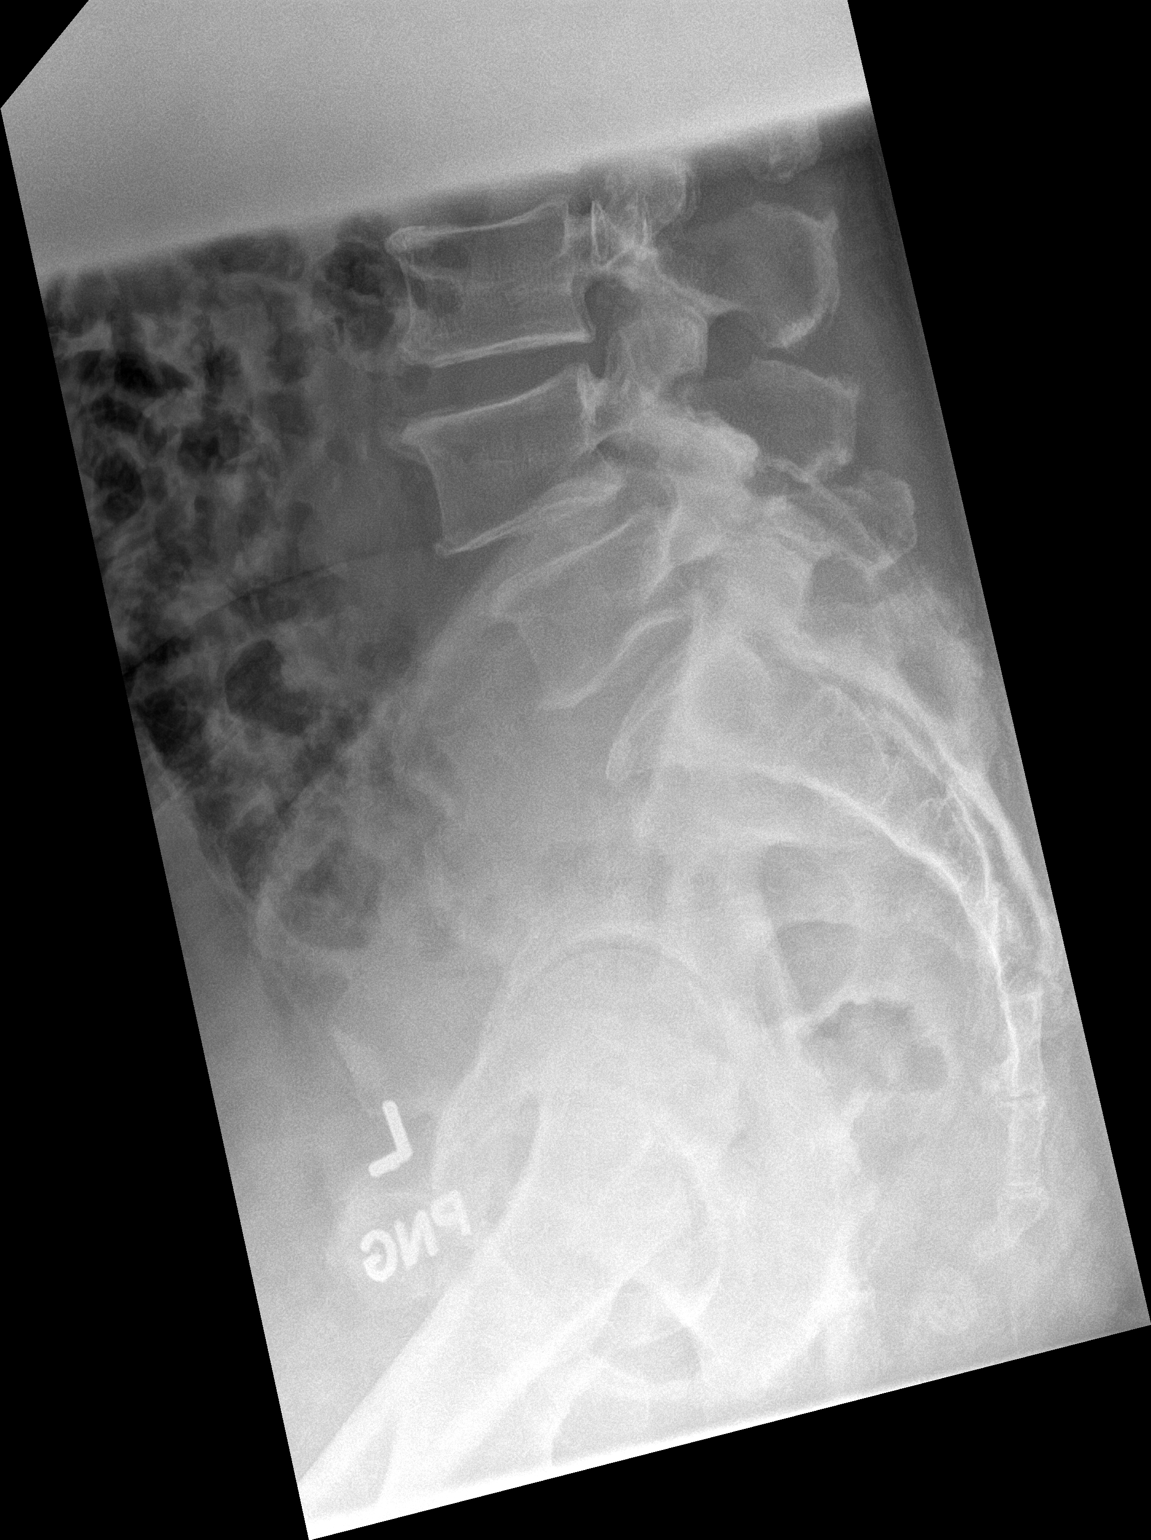

[3 of 3 positions shown; findings below may reference images not displayed]

FINDINGS: Frontal and lateral views of the lumbar spine are obtained. There
are 5 non-rib-bearing lumbar type vertebral bodies in anatomic
alignment. There is prominent spondylosis and facet hypertrophy
within the lower lumbar spine, greatest at L4-5 and L5-S1. No
fractures. Visualized portions of the sacroiliac joints are normal.
IMPRESSION: 1. Prominent lower lumbar spondylosis and facet hypertrophy. No
acute bony abnormality.

## 2022-03-26 ENCOUNTER — Other Ambulatory Visit: Payer: Self-pay | Admitting: Internal Medicine

## 2022-04-03 ENCOUNTER — Encounter: Payer: Self-pay | Admitting: Internal Medicine

## 2022-04-03 NOTE — Progress Notes (Signed)
? ? ? ? ?Subjective:  ? ? Patient ID: Alfred Foster, male    DOB: 06-25-1946, 76 y.o.   MRN: [id number] ? ?This visit occurred during the SARS-CoV-2 public health emergency.  Safety protocols were in place, including screening questions prior to the visit, additional usage of staff PPE, and extensive cleaning of exam room while observing appropriate contact time as indicated for disinfecting solutions.   ? ? ?HPI ?Alfred Foster is here for follow up of his chronic medical problems, including htn, DM ? ?Started spironolactone one month ago.  He has checked his BP at home and it has been about what it is here.   ? ? ?Medications and allergies reviewed with patient and updated if appropriate. ? ?Current Outpatient Medications on File Prior to Visit  ?Medication Sig Dispense Refill  ? amLODipine (NORVASC) 10 MG tablet Take 1 tablet (10 mg total) by mouth daily. 90 tablet 3  ? apixaban (ELIQUIS) 2.5 MG TABS tablet Take 1 tablet (2.5 mg total) by mouth 2 (two) times daily. 180 tablet 3  ? Ascorbic Acid (VITAMIN C PO) Take 1 tablet by mouth daily.    ? aspirin 81 MG tablet Take 81 mg by mouth daily.    ? Cholecalciferol (VITAMIN D3) 1000 UNITS CAPS Take 1,000 Units by mouth daily.    ? dapagliflozin propanediol (FARXIGA) 10 MG TABS tablet Take 1 tablet (10 mg total) by mouth daily before breakfast. 90 tablet 1  ? 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  Dx: K56.256 2 each 0  ? folic acid (FOLVITE) 389 MCG tablet Take 800 mcg by mouth daily.    ? gabapentin (NEURONTIN) 100 MG capsule Take 1 capsule (100 mg total) by mouth at bedtime. 30 capsule 5  ? glimepiride (AMARYL) 1 MG tablet TAKE 1 TABLET(1 MG) BY MOUTH DAILY WITH BREAKFAST 30 tablet 5  ? Ipratropium-Albuterol (COMBIVENT) 20-100 MCG/ACT AERS respimat Inhale 1 puff into the lungs every 6 (six) hours as needed for wheezing or shortness of breath. 4 g 0  ? Lancets (ONETOUCH ULTRASOFT) lancets use as directed 100 each 1   ? losartan (COZAAR) 100 MG tablet Take 1 tablet (100 mg total) by mouth daily. 90 tablet 1  ? metoprolol (TOPROL XL) 200 MG 24 hr tablet Take 1 tablet (200 mg total) by mouth daily. 90 tablet 3  ? niacin 500 MG tablet Take 500 mg by mouth daily.     ? Omega-3 Fatty Acids (FISH OIL PO) Take 1,000 mg by mouth daily.    ? ONE TOUCH ULTRA TEST test strip use as directed 100 each 1  ? rosuvastatin (CRESTOR) 10 MG tablet TAKE 1 TABLET(10 MG) BY MOUTH DAILY 90 tablet 1  ? spironolactone (ALDACTONE) 25 MG tablet Take 0.5 tablets (12.5 mg total) by mouth daily. 30 tablet 5  ? ?No current facility-administered medications on file prior to visit.  ? ? ? ?Review of Systems  ?Constitutional:  Negative for fever.  ?Respiratory:  Negative for shortness of breath.   ?Cardiovascular:  Negative for chest pain, palpitations and leg swelling.  ?Neurological:  Negative for light-headedness and headaches.  ? ?   ?Objective:  ? ?Vitals:  ? 04/08/22 0751  ?BP: 130/86  ?Pulse: 89  ?Temp: 98 ?F (36.7 ?C)  ?SpO2: 99%  ? ?BP Readings from Last 3 Encounters:  ?04/08/22 130/86  ?03/04/22 (!) 146/90  ?12/24/21 (!) 164/106  ? ?Wt Readings from Last 3 Encounters:  ?04/08/22  217 lb (98.4 kg)  ?03/04/22 220 lb (99.8 kg)  ?12/24/21 216 lb 6.4 oz (98.2 kg)  ? ?Body mass index is 28.63 kg/m?. ? ?  ?Physical Exam ?Constitutional:   ?   General: He is not in acute distress. ?   Appearance: Normal appearance. He is not ill-appearing.  ?HENT:  ?   Head: Normocephalic and atraumatic.  ?Eyes:  ?   Conjunctiva/sclera: Conjunctivae normal.  ?Cardiovascular:  ?   Rate and Rhythm: Normal rate and regular rhythm.  ?   Heart sounds: Normal heart sounds. No murmur heard. ?Pulmonary:  ?   Effort: Pulmonary effort is normal. No respiratory distress.  ?   Breath sounds: Normal breath sounds. No wheezing or rales.  ?Musculoskeletal:  ?   Right lower leg: No edema.  ?   Left lower leg: No edema.  ?Skin: ?   General: Skin is warm and dry.  ?   Findings: No rash.   ?Neurological:  ?   Mental Status: He is alert. Mental status is at baseline.  ?Psychiatric:     ?   Mood and Affect: Mood normal.  ? ?   ? ?Lab Results  ?Component Value Date  ? WBC 6.4 03/04/2022  ? HGB 14.2 03/04/2022  ? HCT 42.3 03/04/2022  ? PLT 223.0 03/04/2022  ? GLUCOSE 95 03/04/2022  ? CHOL 116 11/26/2021  ? TRIG 74.0 11/26/2021  ? HDL 38.30 (L) 11/26/2021  ? [place] 63 11/26/2021  ? ALT 14 11/26/2021  ? AST 16 11/26/2021  ? NA 143 03/04/2022  ? K 4.0 03/04/2022  ? CL 111 03/04/2022  ? CREATININE 1.42 03/04/2022  ? BUN 20 03/04/2022  ? CO2 26 03/04/2022  ? TSH 0.83 10/09/2020  ? PSA 0.00 (L) 03/04/2022  ? INR 1.01 12/14/2012  ? HGBA1C 6.9 (H) 03/04/2022  ? MICROALBUR 1.9 03/04/2022  ? ? ? ?Assessment & Plan:  ? ? ?See Problem List for Assessment and Plan of chronic medical problems.  ? ? ?

## 2022-04-03 NOTE — Patient Instructions (Addendum)
? ? ? ?  Blood work was ordered.   ? ? ?Medications changes include :   none ? ? ? ? ?Return in about 3 months (around 07/08/2022) for follow up - same day and as close to time as his wife. ? ?

## 2022-04-08 ENCOUNTER — Ambulatory Visit (INDEPENDENT_AMBULATORY_CARE_PROVIDER_SITE_OTHER): Payer: Medicare Other | Admitting: Internal Medicine

## 2022-04-08 VITALS — BP 130/86 | HR 89 | Temp 98.0°F | Ht 73.0 in | Wt 217.0 lb

## 2022-04-08 DIAGNOSIS — I10 Essential (primary) hypertension: Secondary | ICD-10-CM | POA: Diagnosis not present

## 2022-04-08 DIAGNOSIS — E1165 Type 2 diabetes mellitus with hyperglycemia: Secondary | ICD-10-CM | POA: Diagnosis not present

## 2022-04-08 LAB — BASIC METABOLIC PANEL
BUN: 27 mg/dL — ABNORMAL HIGH (ref 6–23)
CO2: 24 mEq/L (ref 19–32)
Calcium: 9.3 mg/dL (ref 8.4–10.5)
Chloride: 109 mEq/L (ref 96–112)
Creatinine, Ser: 1.7 mg/dL — ABNORMAL HIGH (ref 0.40–1.50)
GFR: 39.16 mL/min — ABNORMAL LOW (ref >60.00)
Glucose, Bld: 125 mg/dL — ABNORMAL HIGH (ref 70–99)
Potassium: 4.6 mEq/L (ref 3.5–5.1)
Sodium: 140 mEq/L (ref 135–145)

## 2022-04-08 NOTE — Assessment & Plan Note (Signed)
Chronic ?Blood pressure well controlled ?CMP ?Continue amlodipine 10 mg daily, losartan 100 mg daily, metoprolol XL 200 mg daily, Spironolactone 12.5 mg daily ?

## 2022-04-08 NOTE — Assessment & Plan Note (Signed)
Chronic ?Lab Results  ?Component Value Date  ? HGBA1C 6.9 (H) 03/04/2022  ? ?Sugars are controlled ?Continue glimepiride 1 mg daily, Farxiga 10 mg daily ?Stressed regular exercise, diabetic diet ? ? ?

## 2022-04-25 ENCOUNTER — Telehealth: Payer: Self-pay

## 2022-04-25 NOTE — Telephone Encounter (Signed)
CMN resubmitted due to system error ?

## 2022-04-28 ENCOUNTER — Telehealth: Payer: Self-pay | Admitting: Internal Medicine

## 2022-04-28 NOTE — Telephone Encounter (Signed)
Patient said that his referral to [facility] has not been received either.  Please resend

## 2022-04-29 NOTE — Telephone Encounter (Signed)
Spoke with patient today.  Have attempted to fax paperwork several times but not able to get it to through.

## 2022-05-03 NOTE — Telephone Encounter (Signed)
Paperwork faxed to [facility] today.

## 2022-05-10 ENCOUNTER — Telehealth: Payer: Self-pay

## 2022-05-10 NOTE — Telephone Encounter (Signed)
Shoes ordered and casts released from fabrication  Apex [id number]

## 2022-05-20 ENCOUNTER — Ambulatory Visit: Payer: Medicare Other | Admitting: Podiatry

## 2022-05-20 DIAGNOSIS — M79675 Pain in left toe(s): Secondary | ICD-10-CM | POA: Diagnosis not present

## 2022-05-20 DIAGNOSIS — B351 Tinea unguium: Secondary | ICD-10-CM

## 2022-05-20 DIAGNOSIS — E119 Type 2 diabetes mellitus without complications: Secondary | ICD-10-CM

## 2022-05-20 DIAGNOSIS — D689 Coagulation defect, unspecified: Secondary | ICD-10-CM | POA: Diagnosis not present

## 2022-05-20 DIAGNOSIS — M79674 Pain in right toe(s): Secondary | ICD-10-CM

## 2022-05-22 NOTE — Progress Notes (Signed)
Subjective: 76 y.o. returns the office today for painful, elongated, thickened toenails which he cannot trim himself. Denies any redness or drainage around the nails. Denies any acute changes since last appointment and no new complaints today. Denies any systemic complaints such as fevers, chills, nausea, vomiting.   PCP: Binnie Rail, MD Last Seen: 04/08/2022  A1c:6.9 on 03/04/2022  Objective: AAO 3, NAD DP pulse 2/4, PT 1/4 b/l, RT < 3 seconds Nails hypertrophic, dystrophic, elongated, brittle, discolored 10. There is tenderness overlying the nails 1-5 bilaterally. There is no surrounding erythema or drainage along the nail sites. No open lesions or pre-ulcerative lesions are identified. No other areas of tenderness bilateral lower extremities. No overlying edema, erythema, increased warmth. No pain with calf compression, swelling, warmth, erythema.  Assessment: Patient presents with symptomatic onychomycosis  Plan: -Treatment options including alternatives, risks, complications were discussed -Nails sharply debrided 10 without complication/bleeding. -Discussed daily foot inspection. If there are any changes, to call the office immediately.  -Follow-up in 3 months or sooner if any problems are to arise. In the meantime, encouraged to call the office with any questions, concerns, changes symptoms.  Celesta Gentile, DPM

## 2022-06-03 ENCOUNTER — Other Ambulatory Visit: Payer: Self-pay | Admitting: Internal Medicine

## 2022-07-01 ENCOUNTER — Ambulatory Visit (INDEPENDENT_AMBULATORY_CARE_PROVIDER_SITE_OTHER): Payer: Medicare Other

## 2022-07-01 DIAGNOSIS — E119 Type 2 diabetes mellitus without complications: Secondary | ICD-10-CM

## 2022-07-01 DIAGNOSIS — M2012 Hallux valgus (acquired), left foot: Secondary | ICD-10-CM | POA: Diagnosis not present

## 2022-07-01 DIAGNOSIS — M2141 Flat foot [pes planus] (acquired), right foot: Secondary | ICD-10-CM | POA: Diagnosis not present

## 2022-07-01 DIAGNOSIS — M2042 Other hammer toe(s) (acquired), left foot: Secondary | ICD-10-CM | POA: Diagnosis not present

## 2022-07-01 DIAGNOSIS — M2011 Hallux valgus (acquired), right foot: Secondary | ICD-10-CM | POA: Diagnosis not present

## 2022-07-01 NOTE — Progress Notes (Signed)
Patient presents today to pick up diabetic shoes and insoles.  Patient was dispensed 1 pair of diabetic shoes and 3 pairs of foam casted diabetic insoles. Fit was satisfactory. Instructions for break-in and wear was reviewed and a copy was given to the patient.   Re-appointment for regularly scheduled diabetic foot care visits or if they should experience any trouble with the shoes or insoles.  

## 2022-07-07 NOTE — Progress Notes (Unsigned)
Subjective:    Patient ID: Alfred Foster, male    DOB: 1946-06-24, 76 y.o.   MRN: [id number]     HPI Alfred Foster is here for follow up of his chronic medical problems, including htn, DM, CKD, Hld, h/o DVT  Left leg nerve pain - it is intermittent.  No back pain.  Worse with sitting.  Going for 2 months.    Has taken advil or aleve on occasion.   Medications and allergies reviewed with patient and updated if appropriate.  Current Outpatient Medications on File Prior to Visit  Medication Sig Dispense Refill   amLODipine (NORVASC) 10 MG tablet Take 1 tablet (10 mg total) by mouth daily. 90 tablet 3   apixaban (ELIQUIS) 2.5 MG TABS tablet Take 1 tablet (2.5 mg total) by mouth 2 (two) times daily. 180 tablet 3   Ascorbic Acid (VITAMIN C PO) Take 1 tablet by mouth daily.     aspirin 81 MG tablet Take 81 mg by mouth daily.     Cholecalciferol (VITAMIN D3) 1000 UNITS CAPS Take 1,000 Units by mouth daily.     dapagliflozin propanediol (FARXIGA) 10 MG TABS tablet Take 1 tablet (10 mg total) by mouth daily before breakfast. 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  Dx: K27.062 2 each 0   folic acid (FOLVITE) 376 MCG tablet Take 800 mcg by mouth daily.     gabapentin (NEURONTIN) 100 MG capsule Take 1 capsule (100 mg total) by mouth at bedtime. 30 capsule 5   glimepiride (AMARYL) 1 MG tablet TAKE 1 TABLET(1 MG) BY MOUTH DAILY WITH BREAKFAST 30 tablet 5   Ipratropium-Albuterol (COMBIVENT) 20-100 MCG/ACT AERS respimat Inhale 1 puff into the lungs every 6 (six) hours as needed for wheezing or shortness of breath. 4 g 0   Lancets (ONETOUCH ULTRASOFT) lancets use as directed 100 each 1   metoprolol (TOPROL XL) 200 MG 24 hr tablet Take 1 tablet (200 mg total) by mouth daily. 90 tablet 3   niacin 500 MG tablet Take 500 mg by mouth daily.      Omega-3 Fatty Acids (FISH OIL PO) Take 1,000 mg by mouth daily.     ONE TOUCH ULTRA  TEST test strip use as directed 100 each 1   rosuvastatin (CRESTOR) 10 MG tablet TAKE 1 TABLET(10 MG) BY MOUTH DAILY 90 tablet 1   spironolactone (ALDACTONE) 25 MG tablet Take 0.5 tablets (12.5 mg total) by mouth daily. 30 tablet 5   No current facility-administered medications on file prior to visit.     Review of Systems  Constitutional:  Negative for fever.  Respiratory:  Positive for cough (sligth at times). Negative for shortness of breath and wheezing.   Cardiovascular:  Negative for chest pain, palpitations and leg swelling.  Musculoskeletal:        Leg left pain  Neurological:  Negative for light-headedness and headaches.       Objective:   Vitals:   07/08/22 1319  BP: (!) 138/98  Pulse: 85  Temp: 98.7 F (37.1 C)  SpO2: 98%   BP Readings from Last 3 Encounters:  07/08/22 (!) 138/98  04/08/22 130/86  03/04/22 (!) 146/90   Wt Readings from Last 3 Encounters:  07/08/22 211 lb (95.7 kg)  04/08/22 217 lb (98.4 kg)  03/04/22 220 lb (99.8 kg)   Body mass index is 27.84 kg/m.    Physical Exam Constitutional:  General: He is not in acute distress.    Appearance: Normal appearance. He is not ill-appearing.  HENT:     Head: Normocephalic and atraumatic.  Eyes:     Conjunctiva/sclera: Conjunctivae normal.  Cardiovascular:     Rate and Rhythm: Normal rate and regular rhythm.     Heart sounds: Normal heart sounds. No murmur heard. Pulmonary:     Effort: Pulmonary effort is normal. No respiratory distress.     Breath sounds: Normal breath sounds. No wheezing or rales.  Musculoskeletal:     Right lower leg: No edema.     Left lower leg: No edema.  Skin:    General: Skin is warm and dry.     Findings: No rash.  Neurological:     Mental Status: He is alert. Mental status is at baseline.  Psychiatric:        Mood and Affect: Mood normal.        Lab Results  Component Value Date   WBC 6.4 03/04/2022   HGB 14.2 03/04/2022   HCT 42.3 03/04/2022   PLT  223.0 03/04/2022   GLUCOSE 125 (H) 04/08/2022   CHOL 116 11/26/2021   TRIG 74.0 11/26/2021   HDL 38.30 (L) 11/26/2021   LDLCALC 63 11/26/2021   ALT 14 11/26/2021   AST 16 11/26/2021   NA 140 04/08/2022   K 4.6 04/08/2022   CL 109 04/08/2022   CREATININE 1.70 (H) 04/08/2022   BUN 27 (H) 04/08/2022   CO2 24 04/08/2022   TSH 0.83 10/09/2020   PSA 0.00 (L) 03/04/2022   INR 1.01 12/14/2012   HGBA1C 6.9 (H) 03/04/2022   MICROALBUR 1.9 03/04/2022     Assessment & Plan:    See Problem List for Assessment and Plan of chronic medical problems.

## 2022-07-07 NOTE — Patient Instructions (Addendum)
   Follow up with Dr Georgina Snell for your leg pain - you may need another back injection.     Blood work was ordered.     Medications changes include :   none   Your prescription(s) have been sent to your pharmacy.    A referral was ordered for nephrology - Dr Hollie Salk     Someone from that office will call you to schedule an appointment.    Return in about 4 months (around 11/08/2022) for follow up.

## 2022-07-08 ENCOUNTER — Encounter: Payer: Self-pay | Admitting: Internal Medicine

## 2022-07-08 ENCOUNTER — Ambulatory Visit (INDEPENDENT_AMBULATORY_CARE_PROVIDER_SITE_OTHER): Payer: Medicare Other | Admitting: Internal Medicine

## 2022-07-08 VITALS — BP 160/100 | HR 85 | Temp 98.7°F | Ht 73.0 in | Wt 211.0 lb

## 2022-07-08 DIAGNOSIS — E1165 Type 2 diabetes mellitus with hyperglycemia: Secondary | ICD-10-CM

## 2022-07-08 DIAGNOSIS — E782 Mixed hyperlipidemia: Secondary | ICD-10-CM

## 2022-07-08 DIAGNOSIS — N183 Chronic kidney disease, stage 3 unspecified: Secondary | ICD-10-CM | POA: Insufficient documentation

## 2022-07-08 DIAGNOSIS — Z86718 Personal history of other venous thrombosis and embolism: Secondary | ICD-10-CM | POA: Diagnosis not present

## 2022-07-08 DIAGNOSIS — I10 Essential (primary) hypertension: Secondary | ICD-10-CM | POA: Diagnosis not present

## 2022-07-08 DIAGNOSIS — N1832 Chronic kidney disease, stage 3b: Secondary | ICD-10-CM | POA: Diagnosis not present

## 2022-07-08 LAB — COMPREHENSIVE METABOLIC PANEL
ALT: 17 U/L (ref 0–53)
AST: 18 U/L (ref 0–37)
Albumin: 4.5 g/dL (ref 3.5–5.2)
Alkaline Phosphatase: 100 U/L (ref 39–117)
BUN: 14 mg/dL (ref 6–23)
CO2: 26 mEq/L (ref 19–32)
Calcium: 9.6 mg/dL (ref 8.4–10.5)
Chloride: 109 mEq/L (ref 96–112)
Creatinine, Ser: 1.64 mg/dL — ABNORMAL HIGH (ref 0.40–1.50)
GFR: 40.81 mL/min — ABNORMAL LOW (ref >60.00)
Glucose, Bld: 107 mg/dL — ABNORMAL HIGH (ref 70–99)
Potassium: 3.6 mEq/L (ref 3.5–5.1)
Sodium: 142 mEq/L (ref 135–145)
Total Bilirubin: 0.9 mg/dL (ref 0.2–1.2)
Total Protein: 7.4 g/dL (ref 6.0–8.3)

## 2022-07-08 LAB — CBC WITH DIFFERENTIAL/PLATELET
Basophils Absolute: 0 10*3/uL (ref 0.0–0.1)
Basophils Relative: 0.5 % (ref 0.0–3.0)
Eosinophils Absolute: 0.2 10*3/uL (ref 0.0–0.7)
Eosinophils Relative: 2.2 % (ref 0.0–5.0)
HCT: 43.3 % (ref 39.0–52.0)
Hemoglobin: 14.6 g/dL (ref 13.0–17.0)
Lymphocytes Relative: 30.7 % (ref 12.0–46.0)
Lymphs Abs: 2.4 10*3/uL (ref 0.7–4.0)
MCHC: 33.7 g/dL (ref 30.0–36.0)
MCV: 96.7 fl (ref 78.0–100.0)
Monocytes Absolute: 0.8 10*3/uL (ref 0.1–1.0)
Monocytes Relative: 10.5 % (ref 3.0–12.0)
Neutro Abs: 4.3 10*3/uL (ref 1.4–7.7)
Neutrophils Relative %: 56.1 % (ref 43.0–77.0)
Platelets: 223 10*3/uL (ref 150.0–400.0)
RBC: 4.48 Mil/uL (ref 4.22–5.81)
RDW: 13.6 % (ref 11.5–15.5)
WBC: 7.7 10*3/uL (ref 4.0–10.5)

## 2022-07-08 LAB — HEMOGLOBIN A1C: Hgb A1c MFr Bld: 6.7 % — ABNORMAL HIGH (ref 4.6–6.5)

## 2022-07-08 LAB — LIPID PANEL
Cholesterol: 156 mg/dL (ref 0–200)
HDL: 37.9 mg/dL — ABNORMAL LOW (ref >39.00)
LDL Cholesterol: 100 mg/dL — ABNORMAL HIGH (ref 0–99)
NonHDL: 117.66
Total CHOL/HDL Ratio: 4
Triglycerides: 89 mg/dL (ref 0.0–149.0)
VLDL: 17.8 mg/dL (ref 0.0–40.0)

## 2022-07-08 MED ORDER — LOSARTAN POTASSIUM 100 MG PO TABS
100.0000 mg | ORAL_TABLET | Freq: Every day | ORAL | 3 refills | Status: AC
Start: 2022-07-08 — End: ?

## 2022-07-09 NOTE — Assessment & Plan Note (Signed)
Chronic Check lipid panel  Continue crestor 10 mg daily Regular exercise and healthy diet encouraged  

## 2022-07-09 NOTE — Assessment & Plan Note (Signed)
Chronic  Lab Results  Component Value Date   HGBA1C 6.7 (H) 07/08/2022   Sugars adequately controlled Testing sugars 1 times a day Check A1c Continue glimepiride 1 mg, farxiga 10 mg daily,   Stressed regular exercise, diabetic diet

## 2022-07-09 NOTE — Assessment & Plan Note (Signed)
Chronic discussed decreased kidney function Stressed good BP and sugar control Has been taking some nsaids - stressed no nsaids - only tylenol if needed Advised good water intake, keeping weight down, healthy diet Refer to nephrology

## 2022-07-09 NOTE — Assessment & Plan Note (Signed)
Chronic DVT x2  Lifelong a/c Continue eliquis 2.5 mg bid Cmp, cbc

## 2022-07-09 NOTE — Assessment & Plan Note (Signed)
Chronic BP not ideally controlled - he ran out of his losartan and has not taken it in a few days Stressed compliance - stressed not to run out of medication or not take even for a few days Discussed CKD and the harm his high BP may have on his kidneys Continue amlodipine 10 mg daily losartan 100 mg daily, metoprolol xl 200 mg daily, spironolactone 12.5 mg daily cmp

## 2022-07-11 ENCOUNTER — Other Ambulatory Visit: Payer: Self-pay

## 2022-07-11 ENCOUNTER — Ambulatory Visit
Admission: RE | Admit: 2022-07-11 | Discharge: 2022-07-11 | Disposition: A | Discharge status: Home / Self Care | Payer: Medicare Other | Source: Ambulatory Visit | Attending: Internal Medicine | Admitting: Internal Medicine

## 2022-07-11 ENCOUNTER — Telehealth: Payer: Self-pay | Admitting: Internal Medicine

## 2022-07-11 ENCOUNTER — Telehealth: Payer: Self-pay

## 2022-07-11 DIAGNOSIS — N189 Chronic kidney disease, unspecified: Secondary | ICD-10-CM | POA: Diagnosis not present

## 2022-07-11 DIAGNOSIS — N1832 Chronic kidney disease, stage 3b: Secondary | ICD-10-CM

## 2022-07-11 DIAGNOSIS — N281 Cyst of kidney, acquired: Secondary | ICD-10-CM | POA: Diagnosis not present

## 2022-07-11 MED ORDER — ROSUVASTATIN CALCIUM 10 MG PO TABS: ORAL_TABLET | ORAL | 1 refills | Status: DC

## 2022-07-11 NOTE — Telephone Encounter (Signed)
Sent in today 

## 2022-07-11 NOTE — Telephone Encounter (Signed)
Caller & Relationship to patient: Alfred Foster. Spouse  Call back number: [phone]  Date of last office visit: 07/08/22  Date of next office visit: 11/08/22  Medication(s) to be refilled: rosuvastatin (CRESTOR) 10 MG tablet  Preferred Pharmacy:  [facility] [place], [place] AT [place] Phone:  [phone]  Fax:  [phone]

## 2022-07-11 NOTE — Telephone Encounter (Signed)
Would recommend blood pressures less than 150/90 ideally.    Please call patient and make sure he is checking his blood pressure on a regular basis.  Is he okay with me having a nurse call him to help follow-up with his blood pressure make sure his blood pressure is becoming controlled and having to make sure sugars are controlled?     I need him to call us weekly with blood pressure readings until we get his blood pressure ideally controlled definitely less than 140/90-Ideally lower than that

## 2022-07-12 ENCOUNTER — Encounter: Payer: Self-pay | Admitting: Internal Medicine

## 2022-07-12 ENCOUNTER — Telehealth: Payer: Self-pay

## 2022-07-12 DIAGNOSIS — N2889 Other specified disorders of kidney and ureter: Secondary | ICD-10-CM | POA: Insufficient documentation

## 2022-07-12 NOTE — Telephone Encounter (Signed)
Spoke with patient today and info given. 

## 2022-07-12 NOTE — Telephone Encounter (Signed)
Sorry I need to clarify - call was for dentist -- BP goal for dental work - ideally < 150/90   Goal for pt for BP is < 130/80

## 2022-07-12 NOTE — Telephone Encounter (Signed)
Spoke with Legrand Como today and info given. He said note would be made on chart

## 2022-07-12 NOTE — Telephone Encounter (Signed)
Please call patient-let him know his ultrasound showed a possible mass on the right kidney.  We need to get an MRI for further evaluation.  I have ordered this and someone will call him to schedule this.

## 2022-07-14 ENCOUNTER — Ambulatory Visit
Admission: RE | Admit: 2022-07-14 | Discharge: 2022-07-14 | Disposition: A | Discharge status: Home / Self Care | Payer: Medicare Other | Source: Ambulatory Visit | Attending: Internal Medicine | Admitting: Internal Medicine

## 2022-07-14 ENCOUNTER — Other Ambulatory Visit: Payer: Self-pay | Admitting: Internal Medicine

## 2022-07-14 DIAGNOSIS — N1832 Chronic kidney disease, stage 3b: Secondary | ICD-10-CM

## 2022-07-14 DIAGNOSIS — N281 Cyst of kidney, acquired: Secondary | ICD-10-CM | POA: Diagnosis not present

## 2022-07-14 DIAGNOSIS — K7689 Other specified diseases of liver: Secondary | ICD-10-CM | POA: Diagnosis not present

## 2022-07-14 DIAGNOSIS — E119 Type 2 diabetes mellitus without complications: Secondary | ICD-10-CM | POA: Diagnosis not present

## 2022-07-14 DIAGNOSIS — H2513 Age-related nuclear cataract, bilateral: Secondary | ICD-10-CM | POA: Diagnosis not present

## 2022-07-14 DIAGNOSIS — I10 Essential (primary) hypertension: Secondary | ICD-10-CM

## 2022-07-14 DIAGNOSIS — H25011 Cortical age-related cataract, right eye: Secondary | ICD-10-CM | POA: Diagnosis not present

## 2022-07-14 DIAGNOSIS — N2889 Other specified disorders of kidney and ureter: Secondary | ICD-10-CM

## 2022-07-14 DIAGNOSIS — E1165 Type 2 diabetes mellitus with hyperglycemia: Secondary | ICD-10-CM

## 2022-07-14 DIAGNOSIS — H524 Presbyopia: Secondary | ICD-10-CM | POA: Diagnosis not present

## 2022-07-14 LAB — HM DIABETES EYE EXAM

## 2022-07-14 MED ORDER — GADOBENATE DIMEGLUMINE 529 MG/ML IV SOLN
20.0000 mL | Freq: Once | INTRAVENOUS | Status: AC | PRN
  Administered 2022-07-14: 20 mL via INTRAVENOUS

## 2022-07-20 ENCOUNTER — Telehealth: Payer: Self-pay | Admitting: Internal Medicine

## 2022-07-20 ENCOUNTER — Ambulatory Visit (INDEPENDENT_AMBULATORY_CARE_PROVIDER_SITE_OTHER): Payer: Medicare Other

## 2022-07-20 DIAGNOSIS — Z0000 Encounter for general adult medical examination without abnormal findings: Secondary | ICD-10-CM | POA: Diagnosis not present

## 2022-07-20 NOTE — Telephone Encounter (Signed)
Ok to continue.    See what his BP and sugars have been running.

## 2022-07-20 NOTE — Telephone Encounter (Signed)
Pt wife Alfred Foster is requesting a callback. She stated Dr. Quay Burow advised Orpah Greek not to take Aleve or Advil with eliquis because it is a blood thinner. She said Dr. Quay Burow advised that he can take tylenol. Vertie said Jarid has been taking 81 MG aspirin with the eliquis. She would like to know if he can take the aspirin with the eliquis or will that cause a drug interaction as well?   Please advise

## 2022-07-20 NOTE — Progress Notes (Signed)
I connected with Lupe Carney today by telephone and verified that I am speaking with the correct person using two identifiers. Location patient: home Location provider: work Persons participating in the virtual visit: patient, provider.   I discussed the limitations, risks, security and privacy concerns of performing an evaluation and management service by telephone and the availability of in person appointments. I also discussed with the patient that there may be a patient responsible charge related to this service. The patient expressed understanding and verbally consented to this telephonic visit.    Interactive audio and video telecommunications were attempted between this provider and patient, however failed, due to patient having technical difficulties OR patient did not have access to video capability.  We continued and completed visit with audio only.  Some vital signs may be absent or patient reported.   Time Spent with patient on telephone encounter: 30 minutes  Subjective:   Alfred Foster is a 76 y.o. male who presents for Medicare Annual/Subsequent preventive examination.  Review of Systems     Cardiac Risk Factors include: advanced age (>96mn, >>91women);diabetes mellitus;dyslipidemia;family history of premature cardiovascular disease;hypertension;male gender     Objective:    There were no vitals filed for this visit. There is no height or weight on file to calculate BMI.     07/20/2022    4:08 PM 07/30/2021   10:04 AM 10/16/2020    3:47 PM 12/10/2019    4:55 AM 12/09/2019   10:45 AM 09/11/2018    8:28 PM 09/15/2017    3:15 PM  Advanced Directives  Does Patient Have a Medical Advance Directive? Yes No No  No No No  Type of Advance Directive Living will;Healthcare Power of Attorney        Does patient want to make changes to medical advance directive? No - Patient declined        Copy of [place]? No - copy requested        Would  patient like information on creating a medical advance directive?  No - Patient declined Yes (MAU/Ambulatory/Procedural Areas - Information given) No - Patient declined  No - Patient declined Yes (ED - Information included in AVS)    Current Medications (verified) Outpatient Encounter Medications as of 07/20/2022  Medication Sig   amLODipine (NORVASC) 10 MG tablet Take 1 tablet (10 mg total) by mouth daily.   apixaban (ELIQUIS) 2.5 MG TABS tablet Take 1 tablet (2.5 mg total) by mouth 2 (two) times daily.   Ascorbic Acid (VITAMIN C PO) Take 1 tablet by mouth daily.   aspirin 81 MG tablet Take 81 mg by mouth daily.   Cholecalciferol (VITAMIN D3) 1000 UNITS CAPS Take 1,000 Units by mouth daily.   dapagliflozin propanediol (FARXIGA) 10 MG TABS tablet Take 1 tablet (10 mg total) by mouth daily before breakfast.   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  Dx: IP59.163  folic acid (FOLVITE) 8846MCG tablet Take 800 mcg by mouth daily.   gabapentin (NEURONTIN) 100 MG capsule Take 1 capsule (100 mg total) by mouth at bedtime.   glimepiride (AMARYL) 1 MG tablet TAKE 1 TABLET(1 MG) BY MOUTH DAILY WITH BREAKFAST   Ipratropium-Albuterol (COMBIVENT) 20-100 MCG/ACT AERS respimat Inhale 1 puff into the lungs every 6 (six) hours as needed for wheezing or shortness of breath.   Lancets (ONETOUCH ULTRASOFT) lancets use as directed   losartan (COZAAR) 100 MG tablet Take  1 tablet (100 mg total) by mouth daily.   metoprolol (TOPROL XL) 200 MG 24 hr tablet Take 1 tablet (200 mg total) by mouth daily.   niacin 500 MG tablet Take 500 mg by mouth daily.    Omega-3 Fatty Acids (FISH OIL PO) Take 1,000 mg by mouth daily.   ONE TOUCH ULTRA TEST test strip use as directed   rosuvastatin (CRESTOR) 10 MG tablet TAKE 1 TABLET(10 MG) BY MOUTH DAILY   spironolactone (ALDACTONE) 25 MG tablet Take 0.5 tablets (12.5 mg total) by mouth daily.   No facility-administered  encounter medications on file as of 07/20/2022.    Allergies (verified) Hydrochlorothiazide and Metformin and related   History: Past Medical History:  Diagnosis Date   Diabetes mellitus    Hyperlipidemia    Hypertension    Prostate cancer [facility]) 2007   Dr Alinda Money   Past Surgical History:  Procedure Laterality Date   COLONOSCOPY  2004 & 2014   Negative ;[facility]   INGUINAL HERNIA REPAIR     PROSTATECTOMY  04/2006   Dr Alinda Money   UMBILICAL HERNIA REPAIR     Family History  Problem Relation Age of Onset   Diabetes Father    Hypertension Father    Alzheimer's disease Father    Hypertension Mother    Alzheimer's disease Mother    Heart attack Mother 74   Diabetes Brother    Heart disease Brother    Heart attack Maternal Aunt        in 90s   Stroke Neg Hx    Social History   Socioeconomic History   Marital status: Married    Spouse name: Not on file   Number of children: Not on file   Years of education: Not on file   Highest education level: Not on file  Occupational History   Not on file  Tobacco Use   Smoking status: Never   Smokeless tobacco: Never  Substance and Sexual Activity   Alcohol use: No   Drug use: No   Sexual activity: Not on file  Other Topics Concern   Not on file  Social History Narrative   Not on file   Social Determinants of Health   Financial Resource Strain: Low Risk  (07/20/2022)   Overall Financial Resource Strain (CARDIA)    Difficulty of Paying Living Expenses: Not hard at all  Food Insecurity: No Food Insecurity (07/20/2022)   Hunger Vital Sign    Worried About Running Out of Food in the Last Year: Never true    [place] in the Last Year: Never true  Transportation Needs: No Transportation Needs (07/20/2022)   PRAPARE - Hydrologist (Medical): No    Lack of Transportation (Non-Medical): No  Physical Activity: Sufficiently Active (07/20/2022)   Exercise Vital Sign    Days of Exercise per Week: 7 days     Minutes of Exercise per Session: 60 min  Stress: No Stress Concern Present (07/20/2022)   [place]    Feeling of Stress : Not at all  Social Connections: [place] (07/20/2022)   Social Connection and Isolation Panel [NHANES]    Frequency of Communication with Friends and Family: More than three times a week    Frequency of Social Gatherings with Friends and Family: Three times a week    Attends Religious Services: More than 4 times per year    Active Member of Clubs  or Organizations: Yes    Attends Music therapist: More than 4 times per year    Marital Status: Married    Tobacco Counseling Counseling given: Not Answered   Clinical Intake:  Pre-visit preparation completed: Yes  Pain : No/denies pain     BMI - recorded: 27.84 (07/08/2022) Nutritional Status: BMI 25 -29 Overweight Nutritional Risks: None Diabetes: Yes CBG done?: No Did pt. bring in CBG monitor from home?: No  How often do you need to have someone help you when you read instructions, pamphlets, or other written materials from your doctor or pharmacy?: 1 - Never What is the last grade level you completed in school?: HSG; some college  Diabetic? yes  Interpreter Needed?: No  Information entered by :: Lisette Abu, LPN   Activities of Daily Living    07/20/2022    4:11 PM 11/26/2021    1:05 PM  In your present state of health, do you have any difficulty performing the following activities:  Hearing? 0 0  Vision? 0 0  Difficulty concentrating or making decisions? 0 0  Walking or climbing stairs? 0 0  Dressing or bathing? 0 0  Doing errands, shopping? 0 0  Preparing Food and eating ? N   Using the Toilet? N   In the past six months, have you accidently leaked urine? N   Do you have problems with loss of bowel control? N   Managing your Medications? N   Managing your Finances? N   Housekeeping or managing  your Housekeeping? N     Patient Care Team: Binnie Rail, MD as PCP - General (Internal Medicine) Charlton Haws, [facility] as Pharmacist (Pharmacist)  Indicate any recent Medical Services you may have received from other than [facility] providers in the past year (date may be approximate).     Assessment:   This is a routine wellness examination for [place].  Hearing/Vision screen Hearing Screening - Comments:: Patient denied any hearing difficulty.   No hearing aids.  Vision Screening - Comments:: Patient does wear corrective lenses/contacts.  Eye exam done by: Katy Apo, MD.   Dietary issues and exercise activities discussed: Current Exercise Habits: Home exercise routine, Type of exercise: walking, Time (Minutes): 60, Frequency (Times/Week): 7, Weekly Exercise (Minutes/Week): 420, Intensity: Moderate, Exercise limited by: None identified   Goals Addressed             This Visit's Progress    My goal is to eliminate this sciatic nerve pain.        Depression Screen    07/20/2022    4:07 PM 10/09/2020    8:29 AM 10/04/2019    8:06 AM 09/19/2018    8:14 AM 09/15/2017    3:21 PM 04/04/2017   10:42 AM 03/19/2015    8:03 AM  PHQ 2/9 Scores  PHQ - 2 Score 0 0 0 0 0 0 0  PHQ- 9 Score  0  0 0      Fall Risk    07/20/2022    4:11 PM 11/26/2021    1:05 PM 10/16/2020    3:49 PM 03/20/2020    7:53 AM 01/10/2020    8:19 AM  [place] in the past year? 0 0 0 0 0  Number falls in past yr: 0 0 0 0 0  Injury with Fall? 0 0 0 0   Risk for fall due to : No Fall Risks No Fall Risks No Fall Risks  Follow up Falls evaluation completed Falls evaluation completed Falls evaluation completed;Education provided Falls evaluation completed     FALL RISK PREVENTION PERTAINING TO THE HOME:  Any stairs in or around the home? Yes  If so, are there any without handrails? No  Home free of loose throw rugs in walkways, pet beds, electrical cords, etc? Yes  Adequate lighting in your  home to reduce risk of falls? Yes   ASSISTIVE DEVICES UTILIZED TO PREVENT FALLS:  Life alert? No  Use of a cane, walker or w/c? No  Grab bars in the bathroom? Yes  Shower chair or bench in shower? Yes  Elevated toilet seat or a handicapped toilet? No   TIMED UP AND GO:  Was the test performed? No .  Length of time to ambulate 10 feet: n/a sec.   Appearance of gait: Gait not evaluated during this visit.  Cognitive Function:        07/20/2022    4:14 PM  6CIT Screen  What Year? 0 points  What month? 0 points  What time? 0 points  Count back from 20 0 points  Months in reverse 0 points  Repeat phrase 0 points  Total Score 0 points    Immunizations Immunization History  Administered Date(s) Administered   Fluad Quad(high Dose 65+) 09/06/2019, 09/26/2020, 08/27/2021   Influenza Split 10/12/2011, 08/24/2012   Influenza Whole 09/25/2007, 09/30/2008, 09/09/2009, 09/07/2010   Influenza, High Dose Seasonal PF 09/03/2014, 08/18/2016, 09/15/2017, 09/19/2018   Influenza,inj,Quad PF,6+ Mos 09/17/2013, 09/07/2015   PFIZER(Purple Top)SARS-COV-2 Vaccination 03/13/2020, 04/06/2020, 09/22/2020   Pfizer Covid-19 Vaccine Bivalent Booster 64yr & up 09/17/2021   Pneumococcal Conjugate-13 10/28/2014   Pneumococcal Polysaccharide-23 11/12/2012   Td 04/21/2010   Zoster Recombinat (Shingrix) 06/09/2017, 08/15/2017   Zoster, Live 11/23/2012    TDAP status: Due, Education has been provided regarding the importance of this vaccine. Advised may receive this vaccine at local pharmacy or Health Dept. Aware to provide a copy of the vaccination record if obtained from local pharmacy or Health Dept. Verbalized acceptance and understanding.  Flu Vaccine status: Up to date  Pneumococcal vaccine status: Up to date  Covid-19 vaccine status: Completed vaccines  Qualifies for Shingles Vaccine? Yes   Zostavax completed Yes   Shingrix Completed?: Yes  Screening Tests Health Maintenance  Topic Date  Due   TETANUS/TDAP  04/21/2020   INFLUENZA VACCINE  07/12/2022   OPHTHALMOLOGY EXAM  12/09/2022   HEMOGLOBIN A1C  01/08/2023   FOOT EXAM  02/18/2023   Diabetic kidney evaluation - Urine ACR  03/05/2023   COLONOSCOPY (Pts 45-483yrInsurance coverage will need to be confirmed)  06/25/2023   Diabetic kidney evaluation - GFR measurement  07/09/2023   Pneumonia Vaccine 6556Years old  Completed   COVID-19 Vaccine  Completed   Hepatitis C Screening  Completed   Zoster Vaccines- Shingrix  Completed   HPV VACCINES  Aged Out    Health Maintenance  Health Maintenance Due  Topic Date Due   TETANUS/TDAP  04/21/2020   INFLUENZA VACCINE  07/12/2022    Colorectal cancer screening: Type of screening: Colonoscopy. Completed 06/24/2013. Repeat every 10 years  Lung Cancer Screening: (Low Dose CT Chest recommended if Age 451-80ears, 30 pack-year currently smoking OR have quit w/in 15years.) does not qualify.   Lung Cancer Screening Referral: no  Additional Screening:  Hepatitis C Screening: does qualify; Completed 02/17/2016  Vision Screening: Recommended annual ophthalmology exams for early detection of glaucoma and other disorders of the eye. Is the  patient up to date with their annual eye exam?  Yes  Who is the provider or what is the name of the office in which the patient attends annual eye exams? Katy Apo, MD. If pt is not established with a provider, would they like to be referred to a provider to establish care? No .   Dental Screening: Recommended annual dental exams for proper oral hygiene  Community Resource Referral / Chronic Care Management: CRR required this visit?  No   CCM required this visit?  No      Plan:     I have personally reviewed and noted the following in the patient's chart:   Medical and social history Use of alcohol, tobacco or illicit drugs  Current medications and supplements including opioid prescriptions. Patient is not currently taking opioid  prescriptions. Functional ability and status Nutritional status Physical activity Advanced directives List of other physicians Hospitalizations, surgeries, and ER visits in previous 12 months Vitals Screenings to include cognitive, depression, and falls Referrals and appointments  In addition, I have reviewed and discussed with patient certain preventive protocols, quality metrics, and best practice recommendations. A written personalized care plan for preventive services as well as general preventive health recommendations were provided to patient.     Sheral Flow, LPN   05/14/8465   Nurse Notes:  Patient is cogitatively intact. There were no vitals filed for this visit. There is no height or weight on file to calculate BMI. Patient stated that he has no issues with gait or balance; does not use any assistive devices.

## 2022-07-20 NOTE — Patient Instructions (Signed)
Alfred Foster , Thank you for taking time to come for your Medicare Wellness Visit. I appreciate your ongoing commitment to your health goals. Please review the following plan we discussed and let me know if I can assist you in the future.   Screening recommendations/referrals: Colonoscopy: 06/24/2013; due every 10 years Recommended yearly ophthalmology/optometry visit for glaucoma screening and checkup Recommended yearly dental visit for hygiene and checkup  Vaccinations: Influenza vaccine: 08/27/2021 Pneumococcal vaccine: 11/12/2012, 10/28/2014 Tdap vaccine: 04/21/2010; due every 10 years (overdue) Shingles vaccine: 06/09/2017, 08/15/2017   Covid-19: 03/13/2020, 04/06/2020, 09/22/2020, 09/17/2021  Advanced directives: Yes; Please bring a copy of your health care power of attorney and living will to the office at your convenience.  Conditions/risks identified: Yes  Next appointment: Please schedule your next Medicare Wellness Visit with your Nurse Health Advisor in 1 year by calling [phone].  Preventive Care 75 Years and Older, Male Preventive care refers to lifestyle choices and visits with your health care provider that can promote health and wellness. What does preventive care include? A yearly physical exam. This is also called an annual well check. Dental exams once or twice a year. Routine eye exams. Ask your health care provider how often you should have your eyes checked. Personal lifestyle choices, including: Daily care of your teeth and gums. Regular physical activity. Eating a healthy diet. Avoiding tobacco and drug use. Limiting alcohol use. Practicing safe sex. Taking low doses of aspirin every day. Taking vitamin and mineral supplements as recommended by your health care provider. What happens during an annual well check? The services and screenings done by your health care provider during your annual well check will depend on your age, overall health, lifestyle risk  factors, and family history of disease. Counseling  Your health care provider may ask you questions about your: Alcohol use. Tobacco use. Drug use. Emotional well-being. Home and relationship well-being. Sexual activity. Eating habits. History of falls. Memory and ability to understand (cognition). Work and work Statistician. Screening  You may have the following tests or measurements: Height, weight, and BMI. Blood pressure. Lipid and cholesterol levels. These may be checked every 5 years, or more frequently if you are over 46 years old. Skin check. Lung cancer screening. You may have this screening every year starting at age 25 if you have a 30-pack-year history of smoking and currently smoke or have quit within the past 15 years. Fecal occult blood test (FOBT) of the stool. You may have this test every year starting at age 41. Flexible sigmoidoscopy or colonoscopy. You may have a sigmoidoscopy every 5 years or a colonoscopy every 10 years starting at age 23. Prostate cancer screening. Recommendations will vary depending on your family history and other risks. Hepatitis C blood test. Hepatitis B blood test. Sexually transmitted disease (STD) testing. Diabetes screening. This is done by checking your blood sugar (glucose) after you have not eaten for a while (fasting). You may have this done every 1-3 years. Abdominal aortic aneurysm (AAA) screening. You may need this if you are a current or former smoker. Osteoporosis. You may be screened starting at age 42 if you are at high risk. Talk with your health care provider about your test results, treatment options, and if necessary, the need for more tests. Vaccines  Your health care provider may recommend certain vaccines, such as: Influenza vaccine. This is recommended every year. Tetanus, diphtheria, and acellular pertussis (Tdap, Td) vaccine. You may need a Td booster every 10 years. Zoster vaccine. You  may need this after age  76. Pneumococcal 13-valent conjugate (PCV13) vaccine. One dose is recommended after age 63. Pneumococcal polysaccharide (PPSV23) vaccine. One dose is recommended after age 2. Talk to your health care provider about which screenings and vaccines you need and how often you need them. This information is not intended to replace advice given to you by your health care provider. Make sure you discuss any questions you have with your health care provider. Document Released: 12/25/2015 Document Revised: 08/17/2016 Document Reviewed: 09/29/2015 Elsevier Interactive Patient Education  2017 [place] Prevention in the Home Falls can cause injuries. They can happen to people of all ages. There are many things you can do to make your home safe and to help prevent falls. What can I do on the outside of my home? Regularly fix the edges of walkways and driveways and fix any cracks. Remove anything that might make you trip as you walk through a door, such as a raised step or threshold. Trim any bushes or trees on the path to your home. Use bright outdoor lighting. Clear any walking paths of anything that might make someone trip, such as rocks or tools. Regularly check to see if handrails are loose or broken. Make sure that both sides of any steps have handrails. Any raised decks and porches should have guardrails on the edges. Have any leaves, snow, or ice cleared regularly. Use sand or salt on walking paths during winter. Clean up any spills in your garage right away. This includes oil or grease spills. What can I do in the bathroom? Use night lights. Install grab bars by the toilet and in the tub and shower. Do not use towel bars as grab bars. Use non-skid mats or decals in the tub or shower. If you need to sit down in the shower, use a plastic, non-slip stool. Keep the floor dry. Clean up any water that spills on the floor as soon as it happens. Remove soap buildup in the tub or shower  regularly. Attach bath mats securely with double-sided non-slip rug tape. Do not have throw rugs and other things on the floor that can make you trip. What can I do in the bedroom? Use night lights. Make sure that you have a light by your bed that is easy to reach. Do not use any sheets or blankets that are too big for your bed. They should not hang down onto the floor. Have a firm chair that has side arms. You can use this for support while you get dressed. Do not have throw rugs and other things on the floor that can make you trip. What can I do in the kitchen? Clean up any spills right away. Avoid walking on wet floors. Keep items that you use a lot in easy-to-reach places. If you need to reach something above you, use a strong step stool that has a grab bar. Keep electrical cords out of the way. Do not use floor polish or wax that makes floors slippery. If you must use wax, use non-skid floor wax. Do not have throw rugs and other things on the floor that can make you trip. What can I do with my stairs? Do not leave any items on the stairs. Make sure that there are handrails on both sides of the stairs and use them. Fix handrails that are broken or loose. Make sure that handrails are as long as the stairways. Check any carpeting to make sure that it is firmly  attached to the stairs. Fix any carpet that is loose or worn. Avoid having throw rugs at the top or bottom of the stairs. If you do have throw rugs, attach them to the floor with carpet tape. Make sure that you have a light switch at the top of the stairs and the bottom of the stairs. If you do not have them, ask someone to add them for you. What else can I do to help prevent falls? Wear shoes that: Do not have high heels. Have rubber bottoms. Are comfortable and fit you well. Are closed at the toe. Do not wear sandals. If you use a stepladder: Make sure that it is fully opened. Do not climb a closed stepladder. Make sure that  both sides of the stepladder are locked into place. Ask someone to hold it for you, if possible. Clearly mark and make sure that you can see: Any grab bars or handrails. First and last steps. Where the edge of each step is. Use tools that help you move around (mobility aids) if they are needed. These include: Canes. Walkers. Scooters. Crutches. Turn on the lights when you go into a dark area. Replace any light bulbs as soon as they burn out. Set up your furniture so you have a clear path. Avoid moving your furniture around. If any of your floors are uneven, fix them. If there are any pets around you, be aware of where they are. Review your medicines with your doctor. Some medicines can make you feel dizzy. This can increase your chance of falling. Ask your doctor what other things that you can do to help prevent falls. This information is not intended to replace advice given to you by your health care provider. Make sure you discuss any questions you have with your health care provider. Document Released: 09/24/2009 Document Revised: 05/05/2016 Document Reviewed: 01/02/2015 Elsevier Interactive Patient Education  2017 [employer].

## 2022-07-21 MED ORDER — HYDRALAZINE HCL 10 MG PO TABS: 10.0000 mg | ORAL_TABLET | Freq: Two times a day (BID) | ORAL | 5 refills | Status: DC

## 2022-07-21 NOTE — Telephone Encounter (Signed)
Yes continue aspirin.    Do not stop any medications for dental cleaning if it is just a cleaning.    Start additional BP med - hydralazine 10 mg twice daily - [facility] to [facility]

## 2022-07-21 NOTE — Telephone Encounter (Signed)
Spoke with wife today and info given. ?

## 2022-07-28 ENCOUNTER — Encounter: Payer: Self-pay | Admitting: Internal Medicine

## 2022-07-28 NOTE — Progress Notes (Signed)
Outside notes received. Information abstracted. Notes sent to scan.  

## 2022-08-01 ENCOUNTER — Telehealth: Payer: Self-pay

## 2022-08-01 DIAGNOSIS — I10 Essential (primary) hypertension: Secondary | ICD-10-CM

## 2022-08-01 NOTE — Telephone Encounter (Signed)
Pt wife is asking for a referral for a cardiology. She states that his BP has been running high. She reports that several immediate family members have died from [place].  Please advise

## 2022-08-02 ENCOUNTER — Other Ambulatory Visit: Payer: Self-pay | Admitting: Internal Medicine

## 2022-08-02 MED ORDER — HYDRALAZINE HCL 25 MG PO TABS: 25.0000 mg | ORAL_TABLET | Freq: Three times a day (TID) | ORAL | 1 refills | Status: DC

## 2022-08-02 NOTE — Telephone Encounter (Signed)
Cardiology referral ordered for Dr Oval Linsey who specializes in BP  Hopefully he will be able to see her soon, but I am not sure how long it takes to get in with her.   We may need to increase the hydralazine to 25 mg three times a day and have him f/u with me in two weeks.  [facility] rx sent to [facility].  Pls schedule 2 week f/u

## 2022-08-02 NOTE — Telephone Encounter (Signed)
Message left for patient to return call to clinic.  If he calls back please schedule appointment per Dr. Quay Burow recommendation.  Okay to let him know about referral and to pick up new prescription.

## 2022-08-08 ENCOUNTER — Telehealth: Payer: Self-pay | Admitting: *Deleted

## 2022-08-08 NOTE — Chronic Care Management (AMB) (Unsigned)
  Chronic Care Management   Outreach Note  08/08/2022 Name: JORI THRALL MRN: [id number] DOB: [birth year]  [name] is a 76 y.o. year old male who is a primary care patient of Burns, Claudina Lick, MD. I reached out to [name] by phone today in response to a referral sent by Mr. Renella Cunas Funk's primary care provider.  An unsuccessful telephone outreach was attempted today. The patient was referred to the case management team for assistance with care management and care coordination.   Follow Up Plan: A HIPAA compliant phone message was left for the patient providing contact information and requesting a return call.   Julian Hy, [place] Direct Dial: [phone]

## 2022-08-17 ENCOUNTER — Encounter: Payer: Self-pay | Admitting: Cardiovascular Disease

## 2022-08-22 ENCOUNTER — Telehealth: Payer: Self-pay | Admitting: *Deleted

## 2022-08-22 NOTE — Chronic Care Management (AMB) (Signed)
  Care Coordination  Outreach Note  08/22/2022 Name: Alfred Foster MRN: [id number] DOB: 1946/07/26   Care Coordination Outreach Attempts: A second unsuccessful outreach was attempted today to offer the patient with information about available care coordination services as a benefit of their health plan.     Follow Up Plan:  Additional outreach attempts will be made to offer the patient care coordination information and services.   Encounter Outcome:  No Answer  [place]  Direct Dial: [phone]

## 2022-08-23 ENCOUNTER — Ambulatory Visit: Payer: Medicare Other | Admitting: Podiatry

## 2022-08-25 NOTE — Chronic Care Management (AMB) (Signed)
  Care Coordination  Outreach Note  08/25/2022 Name: Alfred Foster MRN: [id number] DOB: 1946/03/13   Care Coordination Outreach Attempts: A third unsuccessful outreach was attempted today to offer the patient with information about available care coordination services as a benefit of their health plan.   Follow Up Plan:  No further outreach attempts will be made at this time. We have been unable to contact the patient to offer or enroll patient in care coordination services  Encounter Outcome:  No Answer   [place]: [phone]

## 2022-08-26 ENCOUNTER — Ambulatory Visit (INDEPENDENT_AMBULATORY_CARE_PROVIDER_SITE_OTHER): Payer: Medicare Other | Admitting: Podiatry

## 2022-08-26 DIAGNOSIS — D689 Coagulation defect, unspecified: Secondary | ICD-10-CM | POA: Diagnosis not present

## 2022-08-26 DIAGNOSIS — M79674 Pain in right toe(s): Secondary | ICD-10-CM | POA: Diagnosis not present

## 2022-08-26 DIAGNOSIS — M79675 Pain in left toe(s): Secondary | ICD-10-CM

## 2022-08-26 DIAGNOSIS — B351 Tinea unguium: Secondary | ICD-10-CM | POA: Diagnosis not present

## 2022-08-26 DIAGNOSIS — E119 Type 2 diabetes mellitus without complications: Secondary | ICD-10-CM | POA: Diagnosis not present

## 2022-08-26 MED ORDER — CICLOPIROX 8 % EX SOLN: Freq: Every day | CUTANEOUS | 2 refills | Status: DC

## 2022-08-26 NOTE — Progress Notes (Unsigned)
Subjective: Chief Complaint  Patient presents with   Diabetes    Diabetic foot care, a1c-6.5, BG- 98 Nail trim     76 y.o. returns the office today for painful, elongated, thickened toenails which he cannot trim himself. Denies any redness or drainage around the nails. Denies any acute changes since last appointment and no new complaints today. Denies any systemic complaints such as fevers, chills, nausea, vomiting.   PCP: Binnie Rail, MD Last Seen: 04/08/2022  A1c:6.9 on 03/04/2022  Objective: AAO 3, NAD DP pulse 2/4, PT 1/4 b/l, RT < 3 seconds Nails hypertrophic, dystrophic, elongated, brittle, discolored 10. There is tenderness overlying the nails 1-5 bilaterally. There is no surrounding erythema or drainage along the nail sites. No open lesions or pre-ulcerative lesions are identified. 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  No pain with calf compression, swelling, warmth, erythema.  Assessment: Patient presents with symptomatic onychomycosis  Plan: -Treatment options including alternatives, risks, complications were discussed -Nails sharply debrided 10 without complication/bleeding. -Discussed daily foot inspection. If there are any changes, to call the office immediately.  -Follow-up in 3 months or sooner if any problems are to arise. In the meantime, encouraged to call the office with any questions, concerns, changes symptoms.  Celesta Gentile, DPM

## 2022-08-26 NOTE — Patient Instructions (Signed)
Ciclopirox Topical Solution What is this medication? CICLOPIROX (sye kloe PEER ox) treats fungal infections of the nails. It belongs to a group of medications called antifungals. It will not treat infections caused by bacteria or viruses. This medicine may be used for other purposes; ask your health care provider or pharmacist if you have questions. COMMON BRAND NAME(S): Ciclodan Nail Solution, CNL8, [place] What should I tell my care team before I take this medication? They need to know if you have any of these conditions: Diabetes (high blood sugar) Immune system problems Organ transplant Receiving steroid inhalers, cream, or lotion Seizures Tingling of the fingers or toes or other nerve disorder An unusual or allergic reaction to ciclopirox, other medications, foods, dyes, or preservatives Pregnant or trying to get pregnant Breast-feeding How should I use this medication? This medication is for external use only. Do not take by mouth. Wash your hands before and after use. If you are treating your hands, only wash your hands before use. Do not get it in your eyes. If you do, rinse your eyes with plenty of cool tap water. Use it as directed on the prescription label at the same time every day. Do not use it more often than directed. Use the medication for the full course as directed by your care team, even if you think you are better. Do not stop using it unless your care team tells you to stop it early. Apply a thin film of the medication to the affected area. Talk to your care team about the use of this medication in children. While it may be prescribed for children as young as 12 years for selected conditions, precautions do apply. Overdosage: If you think you have taken too much of this medicine contact a poison control center or emergency room at once. NOTE: This medicine is only for you. Do not share this medicine with others. What if I miss a dose? If you miss a dose, use it as soon as  you can. If it is almost time for your next dose, use only that dose. Do not use double or extra doses. What may interact with this medication? Interactions are not expected. Do not use any other skin products without telling your care team. This list may not describe all possible interactions. Give your health care provider a list of all the medicines, herbs, non-prescription drugs, or dietary supplements you use. Also tell them if you smoke, drink alcohol, or use illegal drugs. Some items may interact with your medicine. What should I watch for while using this medication? Visit your care team for regular checks on your progress. It may be some time before you see the benefit from this medication. Do not use nail polish or other nail cosmetic products on the treated nails. Removal of the unattached, infected nail by your care team is needed with use of this medication. If you have diabetes or numbness in your fingers or toes, talk to your care team about proper nail care. What side effects may I notice from receiving this medication? Side effects that you should report to your care team as soon as possible: Allergic reactions--skin rash, itching, hives, swelling of the face, lips, tongue, or throat Burning, itching, crusting, or peeling of treated skin Side effects that usually do not require medical attention (report to your care team if they continue or are bothersome): Change in nail shape, thickness, or color Mild skin irritation, redness, or dryness This list may not describe all possible side   effects. Call your doctor for medical advice about side effects. You may report side effects to FDA at 1-800-FDA-1088. Where should I keep my medication? Keep out of the reach of children and pets. Store at room temperature between 20 and 25 degrees C (68 and 77 degrees F). This medication is flammable. Avoid exposure to heat, fire, flame, and smoking. Get rid of medications that are no longer needed  or have expired: Take the medication to a medication take-back program. Check with your pharmacy or law enforcement to find a location. If you cannot return the medication, check the label or package insert to see if the medication should be thrown out in the garbage or flushed down the toilet. If you are not sure, ask your care team. If it is safe to put in the trash, take the medication out of the container. Mix the medication with cat litter, dirt, coffee grounds, or other unwanted substance. Seal the mixture in a bag or container. Put it in the trash. NOTE: This sheet is a summary. It may not cover all possible information. If you have questions about this medicine, talk to your doctor, pharmacist, or health care provider.  2023 Elsevier/Gold Standard (2021-11-09 00:00:00)  

## 2022-09-06 DIAGNOSIS — H21561 Pupillary abnormality, right eye: Secondary | ICD-10-CM | POA: Diagnosis not present

## 2022-09-06 DIAGNOSIS — H25811 Combined forms of age-related cataract, right eye: Secondary | ICD-10-CM | POA: Diagnosis not present

## 2022-09-06 DIAGNOSIS — H269 Unspecified cataract: Secondary | ICD-10-CM | POA: Diagnosis not present

## 2022-09-06 DIAGNOSIS — H25011 Cortical age-related cataract, right eye: Secondary | ICD-10-CM | POA: Diagnosis not present

## 2022-09-06 HISTORY — PX: EYE SURGERY: SHX253

## 2022-09-16 ENCOUNTER — Ambulatory Visit (INDEPENDENT_AMBULATORY_CARE_PROVIDER_SITE_OTHER): Payer: Medicare Other

## 2022-09-16 DIAGNOSIS — Z23 Encounter for immunization: Secondary | ICD-10-CM | POA: Diagnosis not present

## 2022-09-16 NOTE — Progress Notes (Signed)
Flu shot given w/o complications

## 2022-09-28 ENCOUNTER — Telehealth: Payer: Self-pay

## 2022-09-28 NOTE — Telephone Encounter (Signed)
Patient received the [place] Covid vaccine on 10/13.

## 2022-10-03 NOTE — Telephone Encounter (Signed)
Documented already

## 2022-10-07 DIAGNOSIS — Z961 Presence of intraocular lens: Secondary | ICD-10-CM | POA: Diagnosis not present

## 2022-11-06 ENCOUNTER — Encounter: Payer: Self-pay | Admitting: Internal Medicine

## 2022-11-06 NOTE — Progress Notes (Signed)
Subjective:    Patient ID: Alfred Foster, male    DOB: 12/08/1946, 76 y.o.   MRN: [id number]     HPI Gradie is here for follow up of his chronic medical problems, including htn, DM, hld, CKD, h/o DVT   Still pain going down left leg - intermittently.  It is a tingling like pain.  Has had 2 injections in the past and it did help, but symptoms never resolved completely   BP at home fluctuating.  He did not bring his BP log with him.  His blood pressure at times is good, but it still high frequently.  He does eat out nightly and sometimes more than once a day.  Medications and allergies reviewed with patient and updated if appropriate.  Current Outpatient Medications on File Prior to Visit  Medication Sig Dispense Refill   amLODipine (NORVASC) 10 MG tablet Take 1 tablet (10 mg total) by mouth daily. 90 tablet 3   apixaban (ELIQUIS) 2.5 MG TABS tablet Take 1 tablet (2.5 mg total) by mouth 2 (two) times daily. 180 tablet 3   Ascorbic Acid (VITAMIN C PO) Take 1 tablet by mouth daily.     aspirin 81 MG tablet Take 81 mg by mouth daily.     Cholecalciferol (VITAMIN D3) 1000 UNITS CAPS Take 1,000 Units by mouth daily.     ciclopirox (PENLAC) 8 % solution Apply topically at bedtime. Apply over nail and surrounding skin. Apply daily over previous coat. After seven (7) days, may remove with alcohol and continue cycle. 6.6 mL 2   dapagliflozin propanediol (FARXIGA) 10 MG TABS tablet Take 1 tablet (10 mg total) by mouth daily before breakfast. 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  Dx: N62.952 2 each 0   folic acid (FOLVITE) 841 MCG tablet Take 800 mcg by mouth daily.     glimepiride (AMARYL) 1 MG tablet TAKE 1 TABLET(1 MG) BY MOUTH DAILY WITH BREAKFAST 30 tablet 5   Ipratropium-Albuterol (COMBIVENT) 20-100 MCG/ACT AERS respimat Inhale 1 puff into the lungs every 6 (six) hours as needed for wheezing or shortness of  breath. 4 g 0   Lancets (ONETOUCH ULTRASOFT) lancets use as directed 100 each 1   losartan (COZAAR) 100 MG tablet Take 1 tablet (100 mg total) by mouth daily. 90 tablet 3   metoprolol (TOPROL XL) 200 MG 24 hr tablet Take 1 tablet (200 mg total) by mouth daily. 90 tablet 3   niacin 500 MG tablet Take 500 mg by mouth daily.      Omega-3 Fatty Acids (FISH OIL PO) Take 1,000 mg by mouth daily.     ONE TOUCH ULTRA TEST test strip use as directed 100 each 1   spironolactone (ALDACTONE) 25 MG tablet Take 0.5 tablets (12.5 mg total) by mouth daily. 30 tablet 5   No current facility-administered medications on file prior to visit.     Review of Systems  Constitutional:  Negative for chills and fever.  Respiratory:  Negative for cough, shortness of breath and wheezing.   Cardiovascular:  Negative for chest pain, palpitations and leg swelling.  Neurological:  Positive for dizziness (occ with bending over). Negative for light-headedness and headaches.       Objective:   Vitals:   11/08/22 0941 11/08/22 1042  BP: (!) 150/90 (!) 140/82  Pulse: 88   Temp: 98.4 F (36.9 C)   SpO2: 99%  BP Readings from Last 3 Encounters:  11/08/22 (!) 140/82  07/08/22 (!) 160/100  04/08/22 130/86   Wt Readings from Last 3 Encounters:  11/08/22 217 lb 12.8 oz (98.8 kg)  07/08/22 211 lb (95.7 kg)  04/08/22 217 lb (98.4 kg)   Body mass index is 28.74 kg/m.    Physical Exam Constitutional:      General: He is not in acute distress.    Appearance: Normal appearance. He is not ill-appearing.  HENT:     Head: Normocephalic and atraumatic.  Eyes:     Conjunctiva/sclera: Conjunctivae normal.  Cardiovascular:     Rate and Rhythm: Normal rate and regular rhythm.     Heart sounds: Normal heart sounds. No murmur heard. Pulmonary:     Effort: Pulmonary effort is normal. No respiratory distress.     Breath sounds: Normal breath sounds. No wheezing or rales.  Musculoskeletal:     Right lower leg: No  edema.     Left lower leg: No edema.  Skin:    General: Skin is warm and dry.     Findings: No rash.  Neurological:     Mental Status: He is alert. Mental status is at baseline.  Psychiatric:        Mood and Affect: Mood normal.        Lab Results  Component Value Date   WBC 7.7 07/08/2022   HGB 14.6 07/08/2022   HCT 43.3 07/08/2022   PLT 223.0 07/08/2022   GLUCOSE 107 (H) 07/08/2022   CHOL 156 07/08/2022   TRIG 89.0 07/08/2022   HDL 37.90 (L) 07/08/2022   LDLCALC 100 (H) 07/08/2022   ALT 17 07/08/2022   AST 18 07/08/2022   NA 142 07/08/2022   K 3.6 07/08/2022   CL 109 07/08/2022   CREATININE 1.64 (H) 07/08/2022   BUN 14 07/08/2022   CO2 26 07/08/2022   TSH 0.83 10/09/2020   PSA 0.00 (L) 03/04/2022   INR 1.01 12/14/2012   HGBA1C 6.7 (H) 07/08/2022   MICROALBUR 1.9 03/04/2022     Assessment & Plan:    See Problem List for Assessment and Plan of chronic medical problems.

## 2022-11-06 NOTE — Patient Instructions (Addendum)
     Blood work was ordered.   The lab is on the first floor.    Medications changes include :   increase gabapentin to 100 mg twice a day.  Increase hydralazine 50 mg three times a day.  Increase crestor to 20 mg daily    Return in about 6 months (around 05/09/2023) for Physical Exam.

## 2022-11-08 ENCOUNTER — Ambulatory Visit (INDEPENDENT_AMBULATORY_CARE_PROVIDER_SITE_OTHER): Payer: Medicare Other | Admitting: Internal Medicine

## 2022-11-08 VITALS — BP 140/82 | HR 88 | Temp 98.4°F | Ht 73.0 in | Wt 217.8 lb

## 2022-11-08 DIAGNOSIS — Z86718 Personal history of other venous thrombosis and embolism: Secondary | ICD-10-CM | POA: Diagnosis not present

## 2022-11-08 DIAGNOSIS — N1832 Chronic kidney disease, stage 3b: Secondary | ICD-10-CM | POA: Diagnosis not present

## 2022-11-08 DIAGNOSIS — M5416 Radiculopathy, lumbar region: Secondary | ICD-10-CM

## 2022-11-08 DIAGNOSIS — E1165 Type 2 diabetes mellitus with hyperglycemia: Secondary | ICD-10-CM | POA: Diagnosis not present

## 2022-11-08 DIAGNOSIS — I10 Essential (primary) hypertension: Secondary | ICD-10-CM | POA: Diagnosis not present

## 2022-11-08 DIAGNOSIS — E782 Mixed hyperlipidemia: Secondary | ICD-10-CM | POA: Diagnosis not present

## 2022-11-08 LAB — CBC WITH DIFFERENTIAL/PLATELET
Basophils Absolute: 0 10*3/uL (ref 0.0–0.1)
Basophils Relative: 0.6 % (ref 0.0–3.0)
Eosinophils Absolute: 0.2 10*3/uL (ref 0.0–0.7)
Eosinophils Relative: 2.5 % (ref 0.0–5.0)
HCT: 42.8 % (ref 39.0–52.0)
Hemoglobin: 14.9 g/dL (ref 13.0–17.0)
Lymphocytes Relative: 34.6 % (ref 12.0–46.0)
Lymphs Abs: 2.3 10*3/uL (ref 0.7–4.0)
MCHC: 34.8 g/dL (ref 30.0–36.0)
MCV: 96.1 fl (ref 78.0–100.0)
Monocytes Absolute: 0.6 10*3/uL (ref 0.1–1.0)
Monocytes Relative: 8.3 % (ref 3.0–12.0)
Neutro Abs: 3.6 10*3/uL (ref 1.4–7.7)
Neutrophils Relative %: 54 % (ref 43.0–77.0)
Platelets: 267 10*3/uL (ref 150.0–400.0)
RBC: 4.46 Mil/uL (ref 4.22–5.81)
RDW: 14.2 % (ref 11.5–15.5)
WBC: 6.7 10*3/uL (ref 4.0–10.5)

## 2022-11-08 LAB — COMPREHENSIVE METABOLIC PANEL
ALT: 18 U/L (ref 0–53)
AST: 20 U/L (ref 0–37)
Albumin: 4.4 g/dL (ref 3.5–5.2)
Alkaline Phosphatase: 88 U/L (ref 39–117)
BUN: 21 mg/dL (ref 6–23)
CO2: 24 mEq/L (ref 19–32)
Calcium: 9.4 mg/dL (ref 8.4–10.5)
Chloride: 111 mEq/L (ref 96–112)
Creatinine, Ser: 1.48 mg/dL (ref 0.40–1.50)
GFR: 46.05 mL/min — ABNORMAL LOW (ref >60.00)
Glucose, Bld: 104 mg/dL — ABNORMAL HIGH (ref 70–99)
Potassium: 4.3 mEq/L (ref 3.5–5.1)
Sodium: 143 mEq/L (ref 135–145)
Total Bilirubin: 0.4 mg/dL (ref 0.2–1.2)
Total Protein: 7.4 g/dL (ref 6.0–8.3)

## 2022-11-08 LAB — LIPID PANEL
Cholesterol: 126 mg/dL (ref 0–200)
HDL: 39.4 mg/dL (ref >39.00)
LDL Cholesterol: 74 mg/dL (ref 0–99)
NonHDL: 86.91
Total CHOL/HDL Ratio: 3
Triglycerides: 64 mg/dL (ref 0.0–149.0)
VLDL: 12.8 mg/dL (ref 0.0–40.0)

## 2022-11-08 LAB — HEMOGLOBIN A1C: Hgb A1c MFr Bld: 6.7 % — ABNORMAL HIGH (ref 4.6–6.5)

## 2022-11-08 MED ORDER — HYDRALAZINE HCL 50 MG PO TABS: 50.0000 mg | ORAL_TABLET | Freq: Three times a day (TID) | ORAL | 5 refills | Status: DC

## 2022-11-08 MED ORDER — GABAPENTIN 100 MG PO CAPS: 100.0000 mg | ORAL_CAPSULE | Freq: Two times a day (BID) | ORAL | 5 refills | Status: DC

## 2022-11-08 MED ORDER — ROSUVASTATIN CALCIUM 20 MG PO TABS: 20.0000 mg | ORAL_TABLET | Freq: Every day | ORAL | 3 refills | Status: DC

## 2022-11-08 NOTE — Assessment & Plan Note (Addendum)
Chronic Stressed good blood pressure, sugar control Encouraged weight loss No NSAIDs Continue increased water intake CBC, CMP Has been referred to nephrology, but has not gotten a call for an appointment

## 2022-11-08 NOTE — Assessment & Plan Note (Addendum)
Chronic Blood pressure not controlled Discussed goal of BP less than 130/80 consistently Stressed the importance of getting his blood pressure well-controlled to prevent further kidney damage He is eating a high sodium diet-stressed that he needs to change his eating habits Encouraged more exercise-he is active, but is not exercising regularly Encouraged losing 5 pounds CMP Continue amlodipine 10 mg daily, losartan 100 mg daily, metoprolol XL 200 mg daily, spironolactone 12.5 mg daily Increase hydralazine to 50 mg 3 times daily Has appointment to see Dr. Oval Linsey this week

## 2022-11-08 NOTE — Assessment & Plan Note (Addendum)
chronic Has had 2 injections in the past and is seeing sports medicine Encouraged him to follow-up with sports medicine and consider another injection Currently taking gabapentin 300 mg at night-can increase to 100 mg twice daily to see if that helps-discussed possibility of drowsiness

## 2022-11-08 NOTE — Assessment & Plan Note (Signed)
Chronic Regular exercise and healthy diet encouraged Check lipid panel today LDL above goal after last blood work Architectural technologist, but increase to 20 mg daily

## 2022-11-08 NOTE — Assessment & Plan Note (Signed)
Chronic   Lab Results  Component Value Date   HGBA1C 6.7 (H) 07/08/2022   Sugars controlled Testing sugars 1 times a day Check A1c  Continue glimepiride 1 mg daily, Farxiga 10 mg daily Stressed regular exercise, diabetic diet

## 2022-11-08 NOTE — Assessment & Plan Note (Signed)
Chronic History of DVT x 2 Lifelong AC Continue Eliquis 2.5 mg twice daily CBC, CMP

## 2022-11-09 NOTE — Progress Notes (Signed)
[facility] Initial Assessment:    Date:  11/10/2022   ID:  Alfred Foster, DOB 1946-08-01, MRN [id number]  PCP:  Binnie Rail, MD  Cardiologist:  None  Nephrologist:  Referring MD: Binnie Rail, MD   CC: Hypertension  History of Present Illness:    Alfred Foster is a 76 y.o. male with a hx of carotid stenosis, DVT, CKD III, hypertension, hyperlipidemia, diabetes mellitus, and prostate cancer, here to establish care in the [facility]. He last saw Dr. Percival Spanish 12/2021 for shortness of breath. He had an ETT that showed frequent PVCs and up to 7 beats of NSVT. His blood pressure was 185/95 at rest, and increased to 213/106 with exertion. It was negative for ischemia. Carotid dopplers at that time revealed mild stenosis bilaterally. Metoprolol was increased. He last saw Dr. Quay Burow two days ago, and his blood pressure was 150/90. His home blood pressures were mostly elevated but fluctuated. Hydralazine was increased, and he remained on amlodipine, losartan, metoprolol, and spironolactone.  Today, he is accompanied by his wife. Initially in clinic his blood pressure was 134/60 in his left arm, and a short time later 124/62 in his right arm. On recheck his left arm BP is 132/52. He confirms his BP has been labile despite compliance with his antihypertensives. At home he is using a wrist cuff. We reviewed his current medication regimen: AM: Hydralazine, Losartan, Amlodipine, Metoprolol Mid-day: Hydralazine, PM: Hydralazine, Metoprolol  Additionally he complains of sciatic nerve pain, mainly with pain radiating distally to his left foot. This occurs only at certain times of the day. He has received 2 steroidal injections in his back but may not pursue further injections. Sometimes when bending over or he may become dizzy and short of breath. This has been occurring more frequently because he needs to change positions often with his daily work. Overall, he feels  that his symptoms are better now than during his stress test in 12/2021. Lately he does not formally exercise too often. 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 When he is able to exercise usually with weights or calisthenics, he feels fine. No chest pain or pressure, no shortness of breath. They enjoy going out to eat, but have been working on cooking more meals at home and monitoring salt intake. Occasionally he will drink sugar-free sodas. He endorses snoring as well. He denies any chest pain, headaches, syncope, orthopnea, or PND.   Past Medical History:  Diagnosis Date   Diabetes mellitus    Hyperlipidemia    Hypertension    Prostate cancer ([place]) 12/12/2005   Dr Alinda Money   Snoring 11/10/2022    Past Surgical History:  Procedure Laterality Date   COLONOSCOPY  2004 & 2014   Negative ;[facility]   INGUINAL HERNIA REPAIR     PROSTATECTOMY  04/2006   Dr Alinda Money   UMBILICAL HERNIA REPAIR      Current Medications: Current Meds  Medication Sig   apixaban (ELIQUIS) 2.5 MG TABS tablet Take 1 tablet (2.5 mg total) by mouth 2 (two) times daily.   Ascorbic Acid (VITAMIN C PO) Take 1 tablet by mouth daily.   aspirin 81 MG tablet Take 81 mg by mouth daily.   Cholecalciferol (VITAMIN D3) 1000 UNITS CAPS Take 1,000 Units by mouth daily.   ciclopirox (PENLAC) 8 % solution Apply topically at bedtime. Apply over nail and surrounding skin. Apply daily over previous coat.  After seven (7) days, may remove with alcohol and continue cycle.   dapagliflozin propanediol (FARXIGA) 10 MG TABS tablet Take 1 tablet (10 mg total) by mouth daily before breakfast.   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  Dx: W29.562   folic acid (FOLVITE) 130 MCG tablet Take 800 mcg by mouth daily.   gabapentin (NEURONTIN) 100 MG capsule Take 1 capsule (100 mg total) by mouth 2 (two) times daily.   glimepiride (AMARYL) 1 MG tablet  TAKE 1 TABLET(1 MG) BY MOUTH DAILY WITH BREAKFAST   hydrALAZINE (APRESOLINE) 50 MG tablet Take 1 tablet (50 mg total) by mouth 3 (three) times daily.   Ipratropium-Albuterol (COMBIVENT) 20-100 MCG/ACT AERS respimat Inhale 1 puff into the lungs every 6 (six) hours as needed for wheezing or shortness of breath.   Lancets (ONETOUCH ULTRASOFT) lancets use as directed   losartan (COZAAR) 100 MG tablet Take 1 tablet (100 mg total) by mouth daily.   metoprolol (TOPROL XL) 200 MG 24 hr tablet Take 1 tablet (200 mg total) by mouth daily.   niacin 500 MG tablet Take 500 mg by mouth daily.    Omega-3 Fatty Acids (FISH OIL PO) Take 1,000 mg by mouth daily.   ONE TOUCH ULTRA TEST test strip use as directed   rosuvastatin (CRESTOR) 20 MG tablet Take 1 tablet (20 mg total) by mouth daily.   spironolactone (ALDACTONE) 25 MG tablet Take 0.5 tablets (12.5 mg total) by mouth daily.     Allergies:   Hydrochlorothiazide and Metformin and related   Social History   Socioeconomic History   Marital status: Married    Spouse name: Not on file   Number of children: Not on file   Years of education: Not on file   Highest education level: Not on file  Occupational History   Not on file  Tobacco Use   Smoking status: Never   Smokeless tobacco: Never  Substance and Sexual Activity   Alcohol use: No   Drug use: No   Sexual activity: Not on file  Other Topics Concern   Not on file  Social History Narrative   Not on file   Social Determinants of Health   Financial Resource Strain: Low Risk  (07/20/2022)   Overall Financial Resource Strain (CARDIA)    Difficulty of Paying Living Expenses: Not hard at all  Food Insecurity: No Food Insecurity (07/20/2022)   Hunger Vital Sign    Worried About Running Out of Food in the Last Year: Never true    [place] in the Last Year: Never true  Transportation Needs: No Transportation Needs (07/20/2022)   PRAPARE - Hydrologist (Medical):  No    Lack of Transportation (Non-Medical): No  Physical Activity: Sufficiently Active (07/20/2022)   Exercise Vital Sign    Days of Exercise per Week: 7 days    Minutes of Exercise per Session: 60 min  Stress: No Stress Concern Present (07/20/2022)   [place]    Feeling of Stress : Not at all  Social Connections: [place] (07/20/2022)   Social Connection and Isolation Panel [NHANES]    Frequency of Communication with Friends and Family: More than three times a week    Frequency of Social Gatherings with Friends and Family: Three times a week    Attends Religious Services: More than 4 times per year  Active Member of Clubs or Organizations: Yes    Attends Music therapist: More than 4 times per year    Marital Status: Married     Family History: The patient's family history includes Alzheimer's disease in his father and mother; Diabetes in his brother and father; Heart attack in his brother and maternal aunt; Heart attack (age of onset: 27) in his sister; Heart attack (age of onset: 86) in his mother; Heart disease in his brother; Hypertension in his father and mother. There is no history of Stroke.  ROS:   Please see the history of present illness.    (+) Sciatic pain (+) Dizziness (+) Positional shortness of breath All other systems reviewed and are negative.  EKGs/Labs/Other Studies Reviewed:    ETT  12/31/2021:   No ST deviation was noted.   ETT with fair exercise tolerance (9:00); no chest pain; hypertensive blood pressure response; no diagnostic ST changes; frequent PVCs and 7 beats nonsustained ventricular tachycardia with exercise; negative adequate ETT.  Bilateral Carotid Dopplers  12/17/2021: Summary:  Right Carotid: Velocities in the right ICA are consistent with a 1-39%  stenosis.   Left Carotid: Velocities in the left ICA are consistent with a 1-39%  stenosis.                Non-hemodynamically significant plaque <50% noted in the  CCA.   Vertebrals: Bilateral vertebral arteries demonstrate antegrade flow.  Subclavians: Normal flow hemodynamics were seen in bilateral subclavian               arteries.    EKG:  EKG is personally reviewed. 11/10/2022: Sinus tachycardia. PACs. Rate 103 bpm.  Recent Labs: 11/08/2022: ALT 18; BUN 21; Creatinine, Ser 1.48; Hemoglobin 14.9; Platelets 267.0; Potassium 4.3; Sodium 143   Recent Lipid Panel    Component Value Date/Time   CHOL 126 11/08/2022 1053   TRIG 64.0 11/08/2022 1053   HDL 39.40 11/08/2022 1053   CHOLHDL 3 11/08/2022 1053   VLDL 12.8 11/08/2022 1053   LDLCALC 74 11/08/2022 1053    Physical Exam:    VS:  BP (!) 132/52 (BP Location: Left Arm, Patient Position: Sitting, Cuff Size: Normal)   Pulse (!) 103   Ht '6\' 1"'$  (1.854 m)   Wt 216 lb (98 kg)   BMI 28.50 kg/m  , BMI Body mass index is 28.5 kg/m. GENERAL:  Well appearing HEENT: Pupils equal round and reactive, fundi not visualized, oral mucosa unremarkable NECK:  No jugular venous distention, waveform within normal limits, carotid upstroke brisk and symmetric, no bruits, no thyromegaly LUNGS:  Clear to auscultation bilaterally HEART:  Tachycardic.  Regular rhythm with frequent ectopy.  PMI not displaced or sustained,S1 and S2 within normal limits, no S3, no S4, no clicks, no rubs, no murmurs ABD:  Flat, positive bowel sounds normal in frequency in pitch, no bruits, no rebound, no guarding, no midline pulsatile mass, no hepatomegaly, no splenomegaly EXT:  2 plus pulses throughout, no edema, no cyanosis no clubbing SKIN:  No rashes no nodules NEURO:  Cranial nerves II through XII grossly intact, motor grossly intact throughout PSYCH:  Cognitively intact, oriented to person place and time   ASSESSMENT/PLAN:    Essential hypertension Blood pressure is well-controlled today.  Hydralazine was recently increased, which is likely helping.  BP seems to  be very labile and he isn't sure that his machine is accurate.  We will get a 24 hour BP monitor.  We will also get renal artery  Dopplers, renin/aldosterone, and TSH levels.  He will continue to work on limiting sodium intake and exercising at least 150 minute weekly.  He is unsure whether he is taking amlodipine, and it was not on record with his pharmacy.  He will bring his medication and BP machine to follow up.  Shortness of breath Mr. Douthat reports exertional dyspnea.  He had a stress test earlier this year that was within normal limits.  We will get an echocardiogram to ensure there is no evidence of heart failure or valvular heart disease.  Snoring He notes that he does snore and his wife has noted some disordered breathing.  He also has been very fatigued at times.  We will check a sleep study.   Screening for Secondary Hypertension:     11/10/2022   11:53 AM  Causes  Drugs/Herbals Screened     - Comments occasional soda, no EtOH, limits salt with cooking but eats out.  No NSAIDs  Renovascular HTN Screened     - Comments check renal Doppler  Sleep Apnea Screened     - Comments check sleep study  Thyroid Disease Screened  Hyperaldosteronism Screened  Pheochromocytoma N/A  Coarctation of the Aorta Screened     - Comments BP symmetric  Compliance Screened     - Comments Unclear about whether he is taking amloidpine    Relevant Labs/Studies:    Latest Ref Rng & Units 11/08/2022   10:53 AM 07/08/2022    2:51 PM 04/08/2022    8:18 AM  Basic Labs  Sodium 135 - 145 mEq/L 143  142  140   Potassium 3.5 - 5.1 mEq/L 4.3  3.6  4.6   Creatinine 0.40 - 1.50 mg/dL 1.48  1.64  1.70        Latest Ref Rng & Units 10/09/2020    8:30 AM 09/21/2018    8:09 AM  Thyroid   TSH 0.35 - 4.50 uIU/mL 0.83  1.35                 11/10/2022   12:25 PM  Renovascular   Renal Artery [place] Completed Yes     Disposition:    FU with APP/PharmD in 1-2 months. FU with Arieh Bogue C. Oval Linsey, MD,  [facility] in 6 months.  Medication Adjustments/Labs and Tests Ordered: Current medicines are reviewed at length with the patient today.  Concerns regarding medicines are outlined above.   Orders Placed This Encounter  Procedures   TSH   Aldosterone + renin activity w/ ratio   CAR 24HR BLOOD PRESSURE MONITOR   EKG 12-Lead   ECHOCARDIOGRAM COMPLETE   Split night study   VAS US RENAL ARTERY DUPLEX   No orders of the defined types were placed in this encounter.  I,Mathew Stumpf,acting as a Education administrator for Skeet Latch, MD.,have documented all relevant documentation on the behalf of Skeet Latch, MD,as directed by  Skeet Latch, MD while in the presence of Skeet Latch, MD.  I, Hammond Oval Linsey, MD have reviewed all documentation for this visit.  The documentation of the exam, diagnosis, procedures, and orders on 11/10/2022 are all accurate and complete.   Signed, Skeet Latch, MD  11/10/2022 1:47 PM    [facility]

## 2022-11-10 ENCOUNTER — Encounter (HOSPITAL_BASED_OUTPATIENT_CLINIC_OR_DEPARTMENT_OTHER): Payer: Self-pay | Admitting: Cardiovascular Disease

## 2022-11-10 ENCOUNTER — Ambulatory Visit (HOSPITAL_BASED_OUTPATIENT_CLINIC_OR_DEPARTMENT_OTHER): Payer: Medicare Other | Admitting: Cardiovascular Disease

## 2022-11-10 VITALS — BP 132/52 | HR 103 | Ht 73.0 in | Wt 216.0 lb

## 2022-11-10 DIAGNOSIS — R400 Somnolence: Secondary | ICD-10-CM | POA: Diagnosis not present

## 2022-11-10 DIAGNOSIS — R0683 Snoring: Secondary | ICD-10-CM | POA: Diagnosis not present

## 2022-11-10 DIAGNOSIS — I10 Essential (primary) hypertension: Secondary | ICD-10-CM

## 2022-11-10 DIAGNOSIS — G4733 Obstructive sleep apnea (adult) (pediatric): Secondary | ICD-10-CM

## 2022-11-10 DIAGNOSIS — R0602 Shortness of breath: Secondary | ICD-10-CM

## 2022-11-10 HISTORY — DX: Obstructive sleep apnea (adult) (pediatric): G47.33

## 2022-11-10 HISTORY — DX: Snoring: R06.83

## 2022-11-10 NOTE — Assessment & Plan Note (Signed)
Mr. Dill reports exertional dyspnea.  He had a stress test earlier this year that was within normal limits.  We will get an echocardiogram to ensure there is no evidence of heart failure or valvular heart disease.

## 2022-11-10 NOTE — Assessment & Plan Note (Addendum)
Blood pressure is well-controlled today.  Hydralazine was recently increased, which is likely helping.  BP seems to be very labile and he isn't sure that his machine is accurate.  We will get a 24 hour BP monitor.  We will also get renal artery Dopplers, renin/aldosterone, and TSH levels.  He will continue to work on limiting sodium intake and exercising at least 150 minute weekly.  He is unsure whether he is taking amlodipine, and it was not on record with his pharmacy.  He will bring his medication and BP machine to follow up.

## 2022-11-10 NOTE — Assessment & Plan Note (Signed)
He notes that he does snore and his wife has noted some disordered breathing.  He also has been very fatigued at times.  We will check a sleep study.

## 2022-11-10 NOTE — Patient Instructions (Signed)
Medication Instructions:  Your physician recommends that you continue on your current medications as directed. Please refer to the Current Medication list given to you today.    Labwork: TSH/RENIN/ALDOSTERONE LEVEL SOON    Testing/Procedures: Your physician has requested that you have a renal artery duplex. During this test, an ultrasound is used to evaluate blood flow to the kidneys. Allow one hour for this exam. Do not eat after midnight the day before and avoid carbonated beverages. Take your medications as you usually do.  Your physician has requested that you have an echocardiogram. Echocardiography is a painless test that uses sound waves to create images of your heart. It provides your doctor with information about the size and shape of your heart and how well your heart's chambers and valves are working. This procedure takes approximately one hour. There are no restrictions for this procedure. Please do NOT wear cologne, perfume, aftershave, or lotions (deodorant is allowed). Please arrive 15 minutes prior to your appointment time.  Your physician has recommended that you have a sleep study. This test records several body functions during sleep, including: brain activity, eye movement, oxygen and carbon dioxide blood levels, heart rate and rhythm, breathing rate and rhythm, the flow of air through your mouth and nose, snoring, body muscle movements, and chest and belly movement. ONCE INSURANCE HAS BEEN REVIEWED THE OFFICE WILL CALL YOU WITH DATE AND TIME   24 HOUR BLOOD PRESSURE MONITOR THE OFFICE WILL CALL YOU TO ARRANGE    Follow-Up: 12/29/2022 8:00 am with Overton Mam NP  05/03/2023 8:15 am with Dr Oval Linsey   Special Instructions:  [place], LOG IN THE BOOK PROVIDED. BRING THE BOOK AND YOUR BLOOD PRESSURE MACHINE AND MEDICATOINS TO YOUR FOLLOW UP   DASH Eating Plan DASH stands for "Dietary Approaches to Stop Hypertension." The DASH eating plan is a  healthy eating plan that has been shown to reduce high blood pressure (hypertension). It may also reduce your risk for type 2 diabetes, heart disease, and stroke. The DASH eating plan may also help with weight loss. What are tips for following this plan?  General guidelines Avoid eating more than 2,300 mg (milligrams) of salt (sodium) a day. If you have hypertension, you may need to reduce your sodium intake to 1,500 mg a day. Limit alcohol intake to no more than 1 drink a day for nonpregnant women and 2 drinks a day for men. One drink equals 12 oz of beer, 5 oz of wine, or 1 oz of hard liquor. Work with your health care provider to maintain a healthy body weight or to lose weight. Ask what an ideal weight is for you. Get at least 30 minutes of exercise that causes your heart to beat faster (aerobic exercise) most days of the week. Activities may include walking, swimming, or biking. Work with your health care provider or diet and nutrition specialist (dietitian) to adjust your eating plan to your individual calorie needs. Reading food labels  Check food labels for the amount of sodium per serving. Choose foods with less than 5 percent of the Daily Value of sodium. Generally, foods with less than 300 mg of sodium per serving fit into this eating plan. To find whole grains, look for the word "whole" as the first word in the ingredient list. Shopping Buy products labeled as "low-sodium" or "no salt added." Buy fresh foods. Avoid canned foods and premade or frozen meals. Cooking Avoid adding salt when cooking. Use salt-free  seasonings or herbs instead of table salt or sea salt. Check with your health care provider or pharmacist before using salt substitutes. Do not fry foods. Cook foods using healthy methods such as baking, boiling, grilling, and broiling instead. Cook with heart-healthy oils, such as olive, canola, soybean, or sunflower oil. Meal planning Eat a balanced diet that includes: 5 or  more servings of fruits and vegetables each day. At each meal, try to fill half of your plate with fruits and vegetables. Up to 6-8 servings of whole grains each day. Less than 6 oz of lean meat, poultry, or fish each day. A 3-oz serving of meat is about the same size as a deck of cards. One egg equals 1 oz. 2 servings of low-fat dairy each day. A serving of nuts, seeds, or beans 5 times each week. Heart-healthy fats. Healthy fats called Omega-3 fatty acids are found in foods such as flaxseeds and coldwater fish, like sardines, salmon, and mackerel. Limit how much you eat of the following: Canned or prepackaged foods. Food that is high in trans fat, such as fried foods. Food that is high in saturated fat, such as fatty meat. Sweets, desserts, sugary drinks, and other foods with added sugar. Full-fat dairy products. Do not salt foods before eating. Try to eat at least 2 vegetarian meals each week. Eat more home-cooked food and less restaurant, buffet, and fast food. When eating at a restaurant, ask that your food be prepared with less salt or no salt, if possible. What foods are recommended? The items listed may not be a complete list. Talk with your dietitian about what dietary choices are best for you. Grains Whole-grain or whole-wheat bread. Whole-grain or whole-wheat pasta. Brown rice. [place]. Bulgur. Whole-grain and low-sodium cereals. Pita bread. Low-fat, low-sodium crackers. Whole-wheat flour tortillas. Vegetables Fresh or frozen vegetables (raw, steamed, roasted, or grilled). Low-sodium or reduced-sodium tomato and vegetable juice. Low-sodium or reduced-sodium tomato sauce and tomato paste. Low-sodium or reduced-sodium canned vegetables. Fruits All fresh, dried, or frozen fruit. Canned fruit in natural juice (without added sugar). Meat and other protein foods Skinless chicken or [place]. Ground chicken or [place]. Pork with fat trimmed off. Fish and seafood. Egg whites. Dried  beans, peas, or lentils. Unsalted nuts, nut butters, and seeds. Unsalted canned beans. Lean cuts of beef with fat trimmed off. Low-sodium, lean deli meat. Dairy Low-fat (1%) or fat-free (skim) milk. Fat-free, low-fat, or reduced-fat cheeses. Nonfat, low-sodium ricotta or cottage cheese. Low-fat or nonfat yogurt. Low-fat, low-sodium cheese. Fats and oils Soft margarine without trans fats. Vegetable oil. Low-fat, reduced-fat, or light mayonnaise and salad dressings (reduced-sodium). Canola, safflower, olive, soybean, and sunflower oils. Avocado. Seasoning and other foods Herbs. Spices. Seasoning mixes without salt. Unsalted popcorn and pretzels. Fat-free sweets. What foods are not recommended? The items listed may not be a complete list. Talk with your dietitian about what dietary choices are best for you. Grains Baked goods made with fat, such as croissants, muffins, or some breads. Dry pasta or rice meal packs. Vegetables Creamed or fried vegetables. Vegetables in a cheese sauce. Regular canned vegetables (not low-sodium or reduced-sodium). Regular canned tomato sauce and paste (not low-sodium or reduced-sodium). Regular tomato and vegetable juice (not low-sodium or reduced-sodium). Angie Fava. Olives. Fruits Canned fruit in a light or heavy syrup. Fried fruit. Fruit in cream or butter sauce. Meat and other protein foods Fatty cuts of meat. Ribs. Fried meat. Berniece Salines. Sausage. Bologna and other processed lunch meats. Salami. Fatback. Hotdogs. Bratwurst. Salted nuts and  seeds. Canned beans with added salt. Canned or smoked fish. Whole eggs or egg yolks. Chicken or [place] with skin. Dairy Whole or 2% milk, cream, and half-and-half. Whole or full-fat cream cheese. Whole-fat or sweetened yogurt. Full-fat cheese. Nondairy creamers. Whipped toppings. Processed cheese and cheese spreads. Fats and oils Butter. Stick margarine. Lard. Shortening. Ghee. Bacon fat. Tropical oils, such as coconut, palm kernel, or  palm oil. Seasoning and other foods Salted popcorn and pretzels. Onion salt, garlic salt, seasoned salt, table salt, and sea salt. Worcestershire sauce. Tartar sauce. Barbecue sauce. Teriyaki sauce. Soy sauce, including reduced-sodium. Steak sauce. Canned and packaged gravies. Fish sauce. Oyster sauce. Cocktail sauce. Horseradish that you find on the shelf. Ketchup. Mustard. Meat flavorings and tenderizers. Bouillon cubes. Hot sauce and Tabasco sauce. Premade or packaged marinades. Premade or packaged taco seasonings. Relishes. Regular salad dressings. Where to find more information: National Heart, Lung, and [place]: https://wilson-eaton.com/ American Heart Association: www.heart.org Summary The DASH eating plan is a healthy eating plan that has been shown to reduce high blood pressure (hypertension). It may also reduce your risk for type 2 diabetes, heart disease, and stroke. With the DASH eating plan, you should limit salt (sodium) intake to 2,300 mg a day. If you have hypertension, you may need to reduce your sodium intake to 1,500 mg a day. When on the DASH eating plan, aim to eat more fresh fruits and vegetables, whole grains, lean proteins, low-fat dairy, and heart-healthy fats. Work with your health care provider or diet and nutrition specialist (dietitian) to adjust your eating plan to your individual calorie needs. This information is not intended to replace advice given to you by your health care provider. Make sure you discuss any questions you have with your health care provider. Document Released: 11/17/2011 Document Revised: 11/10/2017 Document Reviewed: 11/21/2016 Elsevier Patient Education  2020 [employer].

## 2022-11-17 DIAGNOSIS — I10 Essential (primary) hypertension: Secondary | ICD-10-CM | POA: Diagnosis not present

## 2022-11-25 ENCOUNTER — Encounter: Payer: Self-pay | Admitting: Student

## 2022-11-25 ENCOUNTER — Ambulatory Visit: Payer: Medicare Other | Admitting: Podiatry

## 2022-11-25 DIAGNOSIS — B351 Tinea unguium: Secondary | ICD-10-CM | POA: Diagnosis not present

## 2022-11-25 DIAGNOSIS — M79675 Pain in left toe(s): Secondary | ICD-10-CM | POA: Diagnosis not present

## 2022-11-25 DIAGNOSIS — M79674 Pain in right toe(s): Secondary | ICD-10-CM | POA: Diagnosis not present

## 2022-11-25 DIAGNOSIS — D689 Coagulation defect, unspecified: Secondary | ICD-10-CM

## 2022-11-25 LAB — ALDOSTERONE + RENIN ACTIVITY W/ RATIO
Aldosterone: <1 ng/dL (ref 0.0–30.0)
Renin Activity, Plasma: 2.813 ng/mL/hr (ref 0.167–5.380)

## 2022-11-25 LAB — TSH: TSH: 0.815 u[IU]/mL (ref 0.450–4.500)

## 2022-11-27 NOTE — Progress Notes (Signed)
Subjective: Chief Complaint  Patient presents with   Diabetes    Diabetic foot care, A1c- 6.7 BG- not taken, Nail trim      76 y.o. returns the office today for painful, elongated, thickened toenails which he cannot trim himself. Denies any redness or drainage around the nails.  The nails grow out long and they cause discomfort.  No open lesions.  He is on Eliquis  PCP: Binnie Rail, MD Last Seen: November 08, 2022 A1c: 6.7 on November 08, 2022  Objective: AAO 3, NAD DP pulse 2/4, PT 1/4 b/l, RT < 3 seconds Nails hypertrophic, dystrophic, elongated, brittle, discolored 10. There is tenderness overlying the nails 1-5 bilaterally. There is no surrounding erythema or drainage along the nail sites. No open lesions or pre-ulcerative lesions are identified. 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  No pain with calf compression, swelling, warmth, erythema.  Assessment: Patient presents with symptomatic onychomycosis  Plan: -Treatment options including alternatives, risks, complications were discussed -Nails sharply debrided 10 without complication/bleeding. Penlac for the nail fungus.  -Daily foot section. -Follow-up in 3 months or sooner if any problems are to arise. In the meantime, encouraged to call the office with any questions, concerns, changes symptoms.  Celesta Gentile, DPM

## 2022-11-28 ENCOUNTER — Telehealth: Payer: Self-pay | Admitting: Internal Medicine

## 2022-11-28 NOTE — Telephone Encounter (Signed)
Patient wants to know if we have any eliquis samples.  Please let patient know.

## 2022-11-28 NOTE — Telephone Encounter (Signed)
Spoke with patient today. 

## 2022-12-01 ENCOUNTER — Encounter (HOSPITAL_BASED_OUTPATIENT_CLINIC_OR_DEPARTMENT_OTHER): Payer: Medicare Other

## 2022-12-01 ENCOUNTER — Other Ambulatory Visit (HOSPITAL_BASED_OUTPATIENT_CLINIC_OR_DEPARTMENT_OTHER): Payer: Medicare Other

## 2022-12-08 ENCOUNTER — Ambulatory Visit (HOSPITAL_BASED_OUTPATIENT_CLINIC_OR_DEPARTMENT_OTHER): Payer: Medicare Other

## 2022-12-08 ENCOUNTER — Ambulatory Visit (INDEPENDENT_AMBULATORY_CARE_PROVIDER_SITE_OTHER): Payer: Medicare Other

## 2022-12-08 DIAGNOSIS — I10 Essential (primary) hypertension: Secondary | ICD-10-CM

## 2022-12-08 DIAGNOSIS — R0602 Shortness of breath: Secondary | ICD-10-CM | POA: Diagnosis not present

## 2022-12-08 LAB — ECHOCARDIOGRAM COMPLETE
Area-P 1/2: 4.15 cm2
S' Lateral: 2.78 cm

## 2022-12-16 ENCOUNTER — Ambulatory Visit (HOSPITAL_BASED_OUTPATIENT_CLINIC_OR_DEPARTMENT_OTHER): Payer: Medicare Other | Attending: Cardiovascular Disease | Admitting: Cardiovascular Disease

## 2022-12-16 VITALS — Ht 73.0 in | Wt 211.0 lb

## 2022-12-16 DIAGNOSIS — R0683 Snoring: Secondary | ICD-10-CM | POA: Diagnosis not present

## 2022-12-16 DIAGNOSIS — I10 Essential (primary) hypertension: Secondary | ICD-10-CM | POA: Insufficient documentation

## 2022-12-16 DIAGNOSIS — R400 Somnolence: Secondary | ICD-10-CM | POA: Diagnosis not present

## 2022-12-16 DIAGNOSIS — G4733 Obstructive sleep apnea (adult) (pediatric): Secondary | ICD-10-CM | POA: Insufficient documentation

## 2022-12-19 ENCOUNTER — Telehealth: Payer: Self-pay | Admitting: Internal Medicine

## 2022-12-19 ENCOUNTER — Other Ambulatory Visit (HOSPITAL_COMMUNITY): Payer: Self-pay

## 2022-12-19 MED ORDER — APIXABAN 2.5 MG PO TABS
2.5000 mg | ORAL_TABLET | Freq: Two times a day (BID) | ORAL | 2 refills | Status: DC
  Filled 2022-12-19: qty 120, 60d supply, fill #0

## 2022-12-19 NOTE — Telephone Encounter (Signed)
Patient called stating that he needs a refill on his medication apixaban (ELIQUIS) 2.5 MG TABS tablet. Patient is requesting a 2 month supply. Patient is requesting this medication be sent to the [facility]. Best callback number is [phone].

## 2022-12-19 NOTE — Telephone Encounter (Signed)
Sent today

## 2022-12-20 ENCOUNTER — Other Ambulatory Visit (HOSPITAL_COMMUNITY): Payer: Self-pay

## 2022-12-23 ENCOUNTER — Other Ambulatory Visit (HOSPITAL_COMMUNITY): Payer: Self-pay

## 2022-12-26 ENCOUNTER — Telehealth (HOSPITAL_BASED_OUTPATIENT_CLINIC_OR_DEPARTMENT_OTHER): Payer: Self-pay

## 2022-12-26 ENCOUNTER — Other Ambulatory Visit (HOSPITAL_COMMUNITY): Payer: Self-pay

## 2022-12-26 NOTE — Telephone Encounter (Addendum)
Call attempted, no answer, unable to leave message due to full VM.     ----- Message from Loel Dubonnet, NP sent at 12/24/2022  3:58 PM EST ----- Left renal artery 1-59% stenosis. Repeat duplex 1 year for monitoring.

## 2022-12-28 NOTE — Progress Notes (Signed)
Advanced Hypertension Clinic Assessment:    Date:  12/29/2022   ID:  Alfred Foster, DOB December 08, 1946, MRN [id number]  PCP:  Binnie Rail, MD  Cardiologist:  None  Nephrologist:  Referring MD: Binnie Rail, MD   CC: Hypertension  History of Present Illness:    Alfred Foster is a 77 y.o. male with a hx of carotid stenosis, DVT, CKDIII, HTN, HLD, renal artery stenosis, DM2, prostate cancer here to follow up in the Advanced Hypertension Clinic.   Saw Dr. Percival Spanish 12/2021 for shortness of breath. ETT frequent PVC and up to 7 beats of NSVT with no ischemia. Carotid duplex mild stenosis bilaterally. Metoprolol increased. Hydralazine later increased by PCP.   Established with Dr. Oval Linsey and ADV HTN clinic 11/10/22. He noted labile blood pressure and sciatic nerve pain. 24h blood pressure monitor was ordered. Renal artery dopplers with left 1-59% stenosis. Renin/aldosterone and TSH were normal. Due to exertional dyspnea, echocardiogram ordered which revealed normal LVEF, no significant valvular abnormalities. Sleep study ordered due to snoring revealed performed but results not yet available.   He presents today for follow up with his wife. He has no concerns and says he feels great and takes all his medications everyday on time. Denies fatigue, dizziness, headaches, chest pain nor swelling in the lower extremities. He works full- time and is on his feet a lot but wears compression hose. He is doing his best to eat healthy and watchs his sodium intake.  He recently got a new blood pressure cuff and has not yet been checking routinely at home.  Previous antihypertensives: HCTZ - shortness of breath, dizziness  Past Medical History:  Diagnosis Date   Diabetes mellitus    Hyperlipidemia    Hypertension    Prostate cancer ([place]) 12/12/2005   Dr Alinda Money   Snoring 11/10/2022    Past Surgical History:  Procedure Laterality Date   COLONOSCOPY  2004 & 2014   Negative ;[facility]   INGUINAL  HERNIA REPAIR     PROSTATECTOMY  04/2006   Dr Alinda Money   UMBILICAL HERNIA REPAIR      Current Medications: Current Meds  Medication Sig   amLODipine (NORVASC) 10 MG tablet Take 1 tablet (10 mg total) by mouth daily.   apixaban (ELIQUIS) 2.5 MG TABS tablet Take 1 tablet (2.5 mg total) by mouth 2 (two) times daily.   Ascorbic Acid (VITAMIN C PO) Take 1 tablet by mouth daily.   aspirin 81 MG tablet Take 81 mg by mouth daily.   Cholecalciferol (VITAMIN D3) 1000 UNITS CAPS Take 1,000 Units by mouth daily.   ciclopirox (PENLAC) 8 % solution Apply topically at bedtime. Apply over nail and surrounding skin. Apply daily over previous coat. After seven (7) days, may remove with alcohol and continue cycle.   dapagliflozin propanediol (FARXIGA) 10 MG TABS tablet Take 1 tablet (10 mg total) by mouth daily before breakfast.   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  Dx: L89.211   folic acid (FOLVITE) 941 MCG tablet Take 800 mcg by mouth daily.   gabapentin (NEURONTIN) 100 MG capsule Take 1 capsule (100 mg total) by mouth 2 (two) times daily.   glimepiride (AMARYL) 1 MG tablet TAKE 1 TABLET(1 MG) BY MOUTH DAILY WITH BREAKFAST   hydrALAZINE (APRESOLINE) 50 MG tablet Take 1 tablet (50 mg total) by mouth 3 (three) times daily.   Ipratropium-Albuterol (COMBIVENT) 20-100 MCG/ACT AERS respimat Inhale 1 puff into  the lungs every 6 (six) hours as needed for wheezing or shortness of breath.   Lancets (ONETOUCH ULTRASOFT) lancets use as directed   losartan (COZAAR) 100 MG tablet Take 1 tablet (100 mg total) by mouth daily.   metoprolol (TOPROL XL) 200 MG 24 hr tablet Take 1 tablet (200 mg total) by mouth daily.   niacin 500 MG tablet Take 500 mg by mouth daily.    Omega-3 Fatty Acids (FISH OIL PO) Take 1,000 mg by mouth daily.   ONE TOUCH ULTRA TEST test strip use as directed   rosuvastatin (CRESTOR) 20 MG tablet Take 1 tablet (20 mg total) by mouth daily.    spironolactone (ALDACTONE) 25 MG tablet Take 0.5 tablets (12.5 mg total) by mouth daily.     Allergies:   Hydrochlorothiazide and Metformin and related   Social History   Socioeconomic History   Marital status: Married    Spouse name: Not on file   Number of children: Not on file   Years of education: Not on file   Highest education level: Not on file  Occupational History   Not on file  Tobacco Use   Smoking status: Never   Smokeless tobacco: Never  Substance and Sexual Activity   Alcohol use: No   Drug use: No   Sexual activity: Not on file  Other Topics Concern   Not on file  Social History Narrative   Not on file   Social Determinants of Health   Financial Resource Strain: Low Risk  (07/20/2022)   Overall Financial Resource Strain (CARDIA)    Difficulty of Paying Living Expenses: Not hard at all  Food Insecurity: No Food Insecurity (07/20/2022)   Hunger Vital Sign    Worried About Running Out of Food in the Last Year: Never true    [place] in the Last Year: Never true  Transportation Needs: No Transportation Needs (07/20/2022)   PRAPARE - Hydrologist (Medical): No    Lack of Transportation (Non-Medical): No  Physical Activity: Sufficiently Active (07/20/2022)   Exercise Vital Sign    Days of Exercise per Week: 7 days    Minutes of Exercise per Session: 60 min  Stress: No Stress Concern Present (07/20/2022)   [place]    Feeling of Stress : Not at all  Social Connections: [place] (07/20/2022)   Social Connection and Isolation Panel [NHANES]    Frequency of Communication with Friends and Family: More than three times a week    Frequency of Social Gatherings with Friends and Family: Three times a week    Attends Religious Services: More than 4 times per year    Active Member of Clubs or Organizations: Yes    Attends Music therapist: More than 4  times per year    Marital Status: Married    Family History: The patient's family history includes Alzheimer's disease in his father and mother; Diabetes in his brother and father; Heart attack in his brother and maternal aunt; Heart attack (age of onset: 67) in his sister; Heart attack (age of onset: 13) in his mother; Heart disease in his brother; Hypertension in his father and mother. There is no history of Stroke.  ROS:   Please see the history of present illness.     All other systems reviewed and are negative.  EKGs/Labs/Other Studies Reviewed:    EKG:  EKG is not ordered today.  Echo 12/08/22  1. Left ventricular ejection fraction, by estimation, is 65 to 70%. Left  ventricular ejection fraction by PLAX is 67 %. The left ventricle has  normal function. The left ventricle has no regional wall motion  abnormalities. Left ventricular diastolic  parameters were normal.   2. Right ventricular systolic function is normal. The right ventricular  size is normal. Tricuspid regurgitation signal is inadequate for assessing  PA pressure.   3. The mitral valve is abnormal. Trivial mitral valve regurgitation.   4. The aortic valve is tricuspid. Aortic valve regurgitation is not  visualized.   5. The inferior vena cava is normal in size with greater than 50%  respiratory variability, suggesting right atrial pressure of 3 mmHg.  Renal duplex 12/08/22 Right: Abnormal right Resistive Index. Normal cortical thickness of         right kidney. Abnormal size for the right kidney. RRV flow         present. No evidence of right renal artery stenosis.  Left:  1-59% stenosis of the left renal artery. Abnormal left         Resisitve Index. Normal cortical thickness of the left         kidney. Abnormal size for the left kidney. LRV flow present.  Mesenteric:  Areas of limited visceral study include right kidney size, left kidney  size and  mesenteric arteries.  ETT  12/31/2021:   No ST  deviation was noted.   ETT with fair exercise tolerance (9:00); no chest pain; hypertensive blood pressure response; no diagnostic ST changes; frequent PVCs and 7 beats nonsustained ventricular tachycardia with exercise; negative adequate ETT.   Bilateral Carotid Dopplers  12/17/2021: Summary:  Right Carotid: Velocities in the right ICA are consistent with a 1-39%  stenosis.   Left Carotid: Velocities in the left ICA are consistent with a 1-39%  stenosis.               Non-hemodynamically significant plaque <50% noted in the  CCA.   Vertebrals: Bilateral vertebral arteries demonstrate antegrade flow.  Subclavians: Normal flow hemodynamics were seen in bilateral subclavian               arteries.     Recent Labs: 11/08/2022: ALT 18; BUN 21; Creatinine, Ser 1.48; Hemoglobin 14.9; Platelets 267.0; Potassium 4.3; Sodium 143 11/17/2022: TSH 0.815   Recent Lipid Panel    Component Value Date/Time   CHOL 126 11/08/2022 1053   TRIG 64.0 11/08/2022 1053   HDL 39.40 11/08/2022 1053   CHOLHDL 3 11/08/2022 1053   VLDL 12.8 11/08/2022 1053   LDLCALC 74 11/08/2022 1053    Physical Exam:   VS:  BP 119/62   Pulse 68   Ht '6\' 1"'$  (1.854 m)   Wt 213 lb (96.6 kg)   BMI 28.10 kg/m  , BMI Body mass index is 28.1 kg/m. GENERAL:  Well appearing HEENT: Pupils equal round and reactive, fundi not visualized, oral mucosa unremarkable NECK:  No jugular venous distention, waveform within normal limits, carotid upstroke brisk and symmetric, no bruits, no thyromegaly LYMPHATICS:  No cervical adenopathy LUNGS:  Clear to auscultation bilaterally HEART:  RRR.  PMI not displaced or sustained,S1 and S2 within normal limits, no S3, no S4, no clicks, no rubs, no murmurs ABD:  Flat, positive bowel sounds normal in frequency in pitch, no bruits, no rebound, no guarding, no midline pulsatile mass, no hepatomegaly, no splenomegaly EXT:  2 plus pulses throughout, no edema, no cyanosis no  clubbing SKIN:  No rashes  no nodules NEURO:  Cranial nerves II through XII grossly intact, motor grossly intact throughout PSYCH:  Cognitively intact, oriented to person place and time   ASSESSMENT/PLAN:    HTN - Labile BP pending 24h BP monitor. Per our team should be able to complete in next 2 weeks. BP controlled in clinic, continue same medications. Sleep study results pending.  SOB - Echo 12/2022 normal LVEF, no significant valvular abnormalities. ETT 12/31/21 low risk study. Reports no recurrent shortness of breath, no further workup indicated.   Snoring - Sleep study performed 12/16/22. Results pending.   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 Continue to follow with PCP. Continue Aspirin, Rosuvastatin.   Renal artery stenosis - 12/2022 right renal artery 1-59% stenosis. Repeat duplex in one year already ordered.   Screening for Secondary Hypertension:     11/10/2022   11:53 AM  Causes  Drugs/Herbals Screened     - Comments occasional soda, no EtOH, limits salt with cooking but eats out.  No NSAIDs  Renovascular HTN Screened     - Comments check renal Doppler  Sleep Apnea Screened     - Comments check sleep study  Thyroid Disease Screened  Hyperaldosteronism Screened  Pheochromocytoma N/A  Coarctation of the Aorta Screened     - Comments BP symmetric  Compliance Screened     - Comments Unclear about whether he is taking amloidpine    Relevant Labs/Studies:    Latest Ref Rng & Units 11/08/2022   10:53 AM 07/08/2022    2:51 PM 04/08/2022    8:18 AM  Basic Labs  Sodium 135 - 145 mEq/L 143  142  140   Potassium 3.5 - 5.1 mEq/L 4.3  3.6  4.6   Creatinine 0.40 - 1.50 mg/dL 1.48  1.64  1.70        Latest Ref Rng & Units 11/17/2022    9:53 AM 10/09/2020    8:30 AM  Thyroid   TSH 0.450 - 4.500 uIU/mL 0.815  0.83        Latest Ref Rng & Units 11/17/2022    9:53 AM  Renin/Aldosterone   Aldosterone 0.0 - 30.0 ng/dL <1.0   Aldos/Renin Ratio 0.0 - 30.0 <.4               12/08/2022    8:16 AM  Renovascular   Renal Artery [place] Completed Yes    Disposition:    FU with MD/PharmD in 4 months    Medication Adjustments/Labs and Tests Ordered: Current medicines are reviewed at length with the patient today.  Concerns regarding medicines are outlined above.  No orders of the defined types were placed in this encounter.  No orders of the defined types were placed in this encounter.    Signed, Loel Dubonnet, NP  12/29/2022 10:26 AM    [place]

## 2022-12-29 ENCOUNTER — Ambulatory Visit (HOSPITAL_BASED_OUTPATIENT_CLINIC_OR_DEPARTMENT_OTHER): Payer: Medicare Other | Admitting: Family

## 2022-12-29 ENCOUNTER — Telehealth: Payer: Self-pay | Admitting: *Deleted

## 2022-12-29 ENCOUNTER — Encounter (HOSPITAL_BASED_OUTPATIENT_CLINIC_OR_DEPARTMENT_OTHER): Payer: Self-pay | Admitting: Family

## 2022-12-29 ENCOUNTER — Other Ambulatory Visit (HOSPITAL_COMMUNITY): Payer: Self-pay

## 2022-12-29 VITALS — BP 119/62 | HR 68 | Ht 73.0 in | Wt 213.0 lb

## 2022-12-29 DIAGNOSIS — I10 Essential (primary) hypertension: Secondary | ICD-10-CM | POA: Diagnosis not present

## 2022-12-29 DIAGNOSIS — I701 Atherosclerosis of renal artery: Secondary | ICD-10-CM

## 2022-12-29 DIAGNOSIS — R0683 Snoring: Secondary | ICD-10-CM | POA: Diagnosis not present

## 2022-12-29 DIAGNOSIS — I6523 Occlusion and stenosis of bilateral carotid arteries: Secondary | ICD-10-CM

## 2022-12-29 DIAGNOSIS — R0602 Shortness of breath: Secondary | ICD-10-CM | POA: Diagnosis not present

## 2022-12-29 NOTE — Telephone Encounter (Signed)
Calling from [facility] office.  We are calling to schedule your 24 hour ambulatory blood pressure monitor ordered by Laurann Montana.  Please call Darrick Penna or Valetta Fuller in the monitor department at [phone].  We should be able to get you in next week for your test.

## 2022-12-29 NOTE — Patient Instructions (Signed)
Medication Instructions:  Continue your same medications.     Testing/Procedures: We will call you when the 24 h BP monitor is available.    Follow-Up: In 4 months with Dr. Oval Linsey in [facility]

## 2022-12-30 ENCOUNTER — Telehealth: Payer: Self-pay | Admitting: Internal Medicine

## 2022-12-30 MED ORDER — DAPAGLIFLOZIN PROPANEDIOL 10 MG PO TABS: 10.0000 mg | ORAL_TABLET | Freq: Every day | ORAL | 1 refills | Status: DC

## 2022-12-30 NOTE — Telephone Encounter (Signed)
Caller & Relationship to patient:  Alfred Foster - wife   Call back number:[phone]   Date of last office visit:  11/08/2022   Date of next office visit: 05/12/2023   Medication(s) to be refilled:  [place]        Preferred Pharmacy:  [facility] on [address], [place]

## 2022-12-30 NOTE — Telephone Encounter (Signed)
Notified pt rx sent to [facility].Marland KitchenJohny Foster

## 2023-01-01 ENCOUNTER — Encounter (HOSPITAL_BASED_OUTPATIENT_CLINIC_OR_DEPARTMENT_OTHER): Payer: Self-pay | Admitting: Cardiovascular Disease

## 2023-01-01 NOTE — Procedures (Signed)
Patient Name: Alfred Foster, Alfred Foster Date: 12/16/2022 Gender: Male D.O.B: 1946-01-04 Age (years): 61 Referring Provider: Skeet Latch Height (inches): 71 Interpreting Physician: Shelva Majestic MD, ABSM Weight (lbs): 211 RPSGT: Baxter Flattery BMI: 29 MRN: [id number] Neck Size: 17.00  CLINICAL INFORMATION Sleep Study Type: Split Night CPAP  Indication for sleep study: OSA, Snoring, Witnessed Apneas, HTN  Epworth Sleepiness Score: 3  SLEEP STUDY TECHNIQUE As per the AASM Manual for the Scoring of Sleep and Associated Events v2.3 (April 2016) with a hypopnea requiring 4% desaturations.  The channels recorded and monitored were frontal, central and occipital EEG, electrooculogram (EOG), submentalis EMG (chin), nasal and oral airflow, thoracic and abdominal wall motion, anterior tibialis EMG, snore microphone, electrocardiogram, and pulse oximetry. Continuous positive airway pressure (CPAP) was initiated when the patient met split night criteria and was titrated according to treat sleep-disordered breathing.  MEDICATIONS amLODipine (NORVASC) 10 MG tablet apixaban (ELIQUIS) 2.5 MG TABS tablet Ascorbic Acid (VITAMIN C PO) aspirin 81 MG tablet Cholecalciferol (VITAMIN D3) 1000 UNITS CAPS ciclopirox (PENLAC) 8 % solution dapagliflozin propanediol (FARXIGA) 10 MG TABS tablet Elastic Bandages & Supports (MEDICAL COMPRESSION SOCKS) MISC folic acid (FOLVITE) 253 MCG tablet gabapentin (NEURONTIN) 100 MG capsule glimepiride (AMARYL) 1 MG tablet hydrALAZINE (APRESOLINE) 50 MG tablet Ipratropium-Albuterol (COMBIVENT) 20-100 MCG/ACT AERS respimat Lancets (ONETOUCH ULTRASOFT) lancets losartan (COZAAR) 100 MG tablet metoprolol (TOPROL XL) 200 MG 24 hr tablet niacin 500 MG tablet Omega-3 Fatty Acids (FISH OIL PO) ONE TOUCH ULTRA TEST test strip rosuvastatin (CRESTOR) 20 MG tablet spironolactone (ALDACTONE) 25 MG tablet Medications self-administered by patient taken the night of  the study : N/A  RESPIRATORY PARAMETERS Diagnostic Total AHI (/hr): 60.5 RDI (/hr): 60.5 OA Index (/hr): 58.7 CA Index (/hr): 0.0 REM AHI (/hr): 48.0 NREM AHI (/hr): 60.7 Supine AHI (/hr): 60.5 Non-supine AHI (/hr): 0 Min O2 Sat (%): 91.0 Mean O2 (%): 97.2 Time below 88% (min): 0   Titration Optimal Pressure (cm): 9 AHI at Optimal Pressure (/hr): 0 Min O2 at Optimal Pressure (%): 97.0 Supine % at Optimal (%): 100 Sleep % at Optimal (%): 100   SLEEP ARCHITECTURE The recording time for the entire night was 419.5 minutes.  During a baseline period of 239.8 minutes, the patient slept for 135.9 minutes in REM and nonREM, yielding a sleep efficiency of 56.7%. Sleep onset after lights out was 0.9 minutes with a REM latency of 176.5 minutes. The patient spent 6.6% of the night in stage N1 sleep, 91.5% in stage N2 sleep, 0.0% in stage N3 and 1.8% in REM.  During the titration period of 172.4 minutes, the patient slept for 168.6 minutes in REM and nonREM, yielding a sleep efficiency of 97.8%. Sleep onset after CPAP initiation was 1.3 minutes with a REM latency of 22.0 minutes. The patient spent 3.6% of the night in stage N1 sleep, 49.0% in stage N2 sleep, 0.0% in stage N3 and 47.5% in REM.  CARDIAC DATA The 2 lead EKG demonstrated sinus rhythm. The mean heart rate was 100.0 beats per minute. Other EKG findings include: None.  LEG MOVEMENT DATA The total Periodic Limb Movements of Sleep (PLMS) were 0. The PLMS index was 0.0 .  IMPRESSIONS - Severe obstructive sleep apnea occurred during the diagnostic portion of the study (AHI  60.5/hour). CPAP was initiated at 5 cm and was titrated to optimal PAP pressure at 9 cm of water (AHI 0; O2 nadir 97%). Reduced REM sleep on the diagnostic evaluation (  1.8%) with REM rebound with CPAP therapy (47.5%).  - No significant central sleep apnea occurred during the diagnostic portion of the study (CAI = 0.0/hour). - The patient had no oxygen desaturation during the  diagnostic portion of the study (Min O2 = 91.0%) - No snoring was audible during the diagnostic portion of the study. - No cardiac abnormalities were noted during this study. - Clinically significant periodic limb movements did not occur during sleep.  DIAGNOSIS - Obstructive Sleep Apnea (G47.33)  RECOMMENDATIONS - Recommend an initial trial of CPAP therapy with EPR of 2 at 9 cm H2O with eated humidification. A Medium size Fisher&Paykel Full Face Simplus mask was used for the titration. - Effort should be made to optimize nasal  and oropharyngeal patency. - Avoid alcohol, sedatives and other CNS depressants that may worsen sleep apnea and disrupt normal sleep architecture. - Sleep hygiene should be reviewed to assess factors that may improve sleep quality. - Weight management and regular exercise should be initiated or continued. - Recommend a download and sleep clinic evaluation 4 weeks of therapy.  [Electronically signed] 01/01/2023 10:31 AM  Shelva Majestic MD, [facility], [place], American Board of Sleep Medicine  NPI: [id number]  [place] PH: [phone]   FX: [phone] [place]

## 2023-01-02 ENCOUNTER — Other Ambulatory Visit (HOSPITAL_BASED_OUTPATIENT_CLINIC_OR_DEPARTMENT_OTHER): Payer: Self-pay | Admitting: Cardiovascular Disease

## 2023-01-02 DIAGNOSIS — R0683 Snoring: Secondary | ICD-10-CM

## 2023-01-02 DIAGNOSIS — R0989 Other specified symptoms and signs involving the circulatory and respiratory systems: Secondary | ICD-10-CM

## 2023-01-02 DIAGNOSIS — R400 Somnolence: Secondary | ICD-10-CM

## 2023-01-02 DIAGNOSIS — I10 Essential (primary) hypertension: Secondary | ICD-10-CM

## 2023-01-02 DIAGNOSIS — R0602 Shortness of breath: Secondary | ICD-10-CM

## 2023-01-02 NOTE — Telephone Encounter (Signed)
Appointment scheduled.

## 2023-01-02 NOTE — Telephone Encounter (Signed)
Patient's wife calling back. °

## 2023-01-03 ENCOUNTER — Ambulatory Visit: Payer: Medicare Other | Attending: Cardiovascular Disease

## 2023-01-03 DIAGNOSIS — I10 Essential (primary) hypertension: Secondary | ICD-10-CM

## 2023-01-03 DIAGNOSIS — R0989 Other specified symptoms and signs involving the circulatory and respiratory systems: Secondary | ICD-10-CM | POA: Diagnosis not present

## 2023-01-03 NOTE — Progress Notes (Unsigned)
24 hour ambulatory blood pressure monitor applied to patients left arm using standard adult cuff.

## 2023-01-05 ENCOUNTER — Telehealth: Payer: Self-pay | Admitting: *Deleted

## 2023-01-05 NOTE — Telephone Encounter (Signed)
Spoke with patient's wife. ([place] per dpr). Notified sleep study shows severe OSA. CPAP machine will be ordered. Process explained to her. She will inform the patient. He will callback if he has any questions. CPAP order has been sent to [facility] via [facility] portal.

## 2023-01-05 NOTE — Telephone Encounter (Signed)
-----  Message from Troy Sine, MD sent at 01/01/2023 10:38 AM EST ----- Mariann Laster, please notify pt and set up with DME for CPAP initiation

## 2023-01-17 DIAGNOSIS — G4733 Obstructive sleep apnea (adult) (pediatric): Secondary | ICD-10-CM | POA: Diagnosis not present

## 2023-01-24 ENCOUNTER — Telehealth: Payer: Self-pay | Admitting: Internal Medicine

## 2023-01-24 NOTE — Telephone Encounter (Signed)
Patient's wife called and asked for a medication list and immunization records to be left at the front for her to pick up for patient. She has an appointment on 01/27/23 at 9:40 and would like to pick it up then, if possible. Best callback number is [phone].

## 2023-01-24 NOTE — Telephone Encounter (Signed)
Info printed out

## 2023-01-27 ENCOUNTER — Encounter: Payer: Self-pay | Admitting: Internal Medicine

## 2023-01-27 DIAGNOSIS — N1832 Chronic kidney disease, stage 3b: Secondary | ICD-10-CM | POA: Diagnosis not present

## 2023-01-27 DIAGNOSIS — Z86718 Personal history of other venous thrombosis and embolism: Secondary | ICD-10-CM | POA: Diagnosis not present

## 2023-01-27 DIAGNOSIS — I701 Atherosclerosis of renal artery: Secondary | ICD-10-CM | POA: Diagnosis not present

## 2023-01-27 DIAGNOSIS — E1122 Type 2 diabetes mellitus with diabetic chronic kidney disease: Secondary | ICD-10-CM | POA: Diagnosis not present

## 2023-01-27 DIAGNOSIS — E785 Hyperlipidemia, unspecified: Secondary | ICD-10-CM | POA: Diagnosis not present

## 2023-02-13 ENCOUNTER — Telehealth (HOSPITAL_BASED_OUTPATIENT_CLINIC_OR_DEPARTMENT_OTHER): Payer: Self-pay | Admitting: *Deleted

## 2023-02-13 MED ORDER — HYDRALAZINE HCL 100 MG PO TABS: 100.0000 mg | ORAL_TABLET | Freq: Three times a day (TID) | ORAL | 3 refills | Status: DC

## 2023-02-13 NOTE — Telephone Encounter (Signed)
-----   Message from Skeet Latch, MD sent at 02/11/2023  1:37 PM EST ----- Blood pressure was consistently elevated when he wore the monitor.  Does he remember if he took his BP medication that day?  It was much higher than it has been in the office.

## 2023-02-13 NOTE — Telephone Encounter (Signed)
Per patient he did take his medications that day He does check his blood pressure at home and it is still running high (140's-180's) Discussed with Dr Oval Linsey and will have patient Hydralazine 100 mg three times a day   Advised patient, verbalized understanding

## 2023-02-15 DIAGNOSIS — G4733 Obstructive sleep apnea (adult) (pediatric): Secondary | ICD-10-CM | POA: Diagnosis not present

## 2023-02-24 ENCOUNTER — Ambulatory Visit: Payer: Medicare Other | Admitting: Podiatry

## 2023-02-24 ENCOUNTER — Ambulatory Visit: Payer: Medicare Other

## 2023-02-24 DIAGNOSIS — D689 Coagulation defect, unspecified: Secondary | ICD-10-CM

## 2023-02-24 DIAGNOSIS — B351 Tinea unguium: Secondary | ICD-10-CM | POA: Diagnosis not present

## 2023-02-24 DIAGNOSIS — M79675 Pain in left toe(s): Secondary | ICD-10-CM

## 2023-02-24 DIAGNOSIS — M79674 Pain in right toe(s): Secondary | ICD-10-CM

## 2023-02-27 NOTE — Progress Notes (Signed)
Subjective: Chief Complaint  Patient presents with   routine foot care     77 y.o. returns the office today for painful, elongated, thickened toenails which he cannot trim himself. Denies any redness or drainage around the nails.  The nails grow out long and they cause discomfort.  No open lesions.  He is on Eliquis  PCP: Binnie Rail, MD Last Seen: November 08, 2022 A1c: 6.7 on November 08, 2022  Objective: AAO 3, NAD DP pulse 2/4, PT 1/4 b/l, RT < 3 seconds Nails hypertrophic, dystrophic, elongated, brittle, discolored 10. There is tenderness overlying the nails 1-5 bilaterally. There is no surrounding erythema or drainage along the nail sites. No open lesions or pre-ulcerative lesions are identified. 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  No pain with calf compression, swelling, warmth, erythema. Overall, stable  Assessment: Patient presents with symptomatic onychomycosis  Plan: -Treatment options including alternatives, risks, complications were discussed -Nails sharply debrided 10 without complication/bleeding. Penlac for the nail fungus.  -Daily foot section. -Follow-up in 3 months or sooner if any problems are to arise. In the meantime, encouraged to call the office with any questions, concerns, changes symptoms.  Celesta Gentile, DPM

## 2023-03-18 DIAGNOSIS — G4733 Obstructive sleep apnea (adult) (pediatric): Secondary | ICD-10-CM | POA: Diagnosis not present

## 2023-03-23 ENCOUNTER — Other Ambulatory Visit: Payer: Self-pay

## 2023-03-23 ENCOUNTER — Telehealth: Payer: Self-pay | Admitting: Internal Medicine

## 2023-03-23 MED ORDER — GLIMEPIRIDE 1 MG PO TABS: ORAL_TABLET | ORAL | 5 refills | Status: DC

## 2023-03-23 NOTE — Telephone Encounter (Signed)
Prescription Request  03/23/2023  LOV: 11/08/2022  What is the name of the medication or equipment? Glimeperide   Have you contacted your pharmacy to request a refill? Yes   Which pharmacy would you like this sent to?  [id number] - [place], [place] - [address] AT [address] [place] [address] Phone: [phone] Fax: [phone]     Patient notified that their request is being sent to the clinical staff for review and that they should receive a response within 2 business days.   Please advise at Mobile [phone] (mobile)

## 2023-03-23 NOTE — Telephone Encounter (Signed)
Refill has been sent.  °

## 2023-03-24 ENCOUNTER — Ambulatory Visit (INDEPENDENT_AMBULATORY_CARE_PROVIDER_SITE_OTHER): Payer: Medicare Other

## 2023-03-24 DIAGNOSIS — M2141 Flat foot [pes planus] (acquired), right foot: Secondary | ICD-10-CM

## 2023-03-24 DIAGNOSIS — E119 Type 2 diabetes mellitus without complications: Secondary | ICD-10-CM

## 2023-03-24 DIAGNOSIS — M2042 Other hammer toe(s) (acquired), left foot: Secondary | ICD-10-CM

## 2023-03-24 DIAGNOSIS — M2041 Other hammer toe(s) (acquired), right foot: Secondary | ICD-10-CM

## 2023-03-24 DIAGNOSIS — M2142 Flat foot [pes planus] (acquired), left foot: Secondary | ICD-10-CM

## 2023-03-24 NOTE — Progress Notes (Signed)
Patient presents to the office today for diabetic shoe and insole measuring.  ABN signed.   Documentation of medical necessity will be sent to patient's treating diabetic doctor to verify and sign.   Patient's diabetic provider: Pincus Sanes, MD   Shoes and insoles will be ordered at that time and patient will be notified for an appointment for fitting when they arrive.   Patient shoe selection-   1st   Shoe choice:   [id number]  Shoe size ordered: 11XW

## 2023-04-06 ENCOUNTER — Other Ambulatory Visit: Payer: Self-pay | Admitting: Cardiovascular Disease

## 2023-04-06 ENCOUNTER — Other Ambulatory Visit: Payer: Self-pay

## 2023-04-06 ENCOUNTER — Telehealth: Payer: Self-pay | Admitting: Internal Medicine

## 2023-04-06 MED ORDER — APIXABAN 2.5 MG PO TABS: 2.5000 mg | ORAL_TABLET | Freq: Two times a day (BID) | ORAL | 2 refills | Status: DC

## 2023-04-06 NOTE — Telephone Encounter (Signed)
Sent in today 

## 2023-04-06 NOTE — Telephone Encounter (Signed)
Prescription Request  04/06/2023  LOV: 11/08/2022  What is the name of the medication or equipment? apixaban (ELIQUIS) 2.5 MG TABS tablet   Have you contacted your pharmacy to request a refill? Yes   Which pharmacy would you like this sent to?  [facility][id number] - [place], [place] - [address] [place] [address] Phone: [phone] Fax: [phone]    Patient notified that their request is being sent to the clinical staff for review and that they should receive a response within 2 business days.   Please advise at Mobile [phone] (mobile)    Next OV is 05/12/2023

## 2023-04-17 DIAGNOSIS — G4733 Obstructive sleep apnea (adult) (pediatric): Secondary | ICD-10-CM | POA: Diagnosis not present

## 2023-04-28 ENCOUNTER — Other Ambulatory Visit: Payer: Medicare Other

## 2023-05-03 ENCOUNTER — Ambulatory Visit (HOSPITAL_BASED_OUTPATIENT_CLINIC_OR_DEPARTMENT_OTHER): Payer: Medicare Other | Admitting: Cardiovascular Disease

## 2023-05-03 ENCOUNTER — Encounter (HOSPITAL_BASED_OUTPATIENT_CLINIC_OR_DEPARTMENT_OTHER): Payer: Self-pay | Admitting: Cardiovascular Disease

## 2023-05-03 VITALS — BP 116/66 | HR 80 | Ht 73.0 in | Wt 209.4 lb

## 2023-05-03 DIAGNOSIS — E782 Mixed hyperlipidemia: Secondary | ICD-10-CM | POA: Diagnosis not present

## 2023-05-03 DIAGNOSIS — I10 Essential (primary) hypertension: Secondary | ICD-10-CM

## 2023-05-03 NOTE — Progress Notes (Signed)
[facility] Follow-up:    Date:  05/03/2023   ID:  Alfred Foster, DOB 1946/06/07, MRN [id number]  PCP:  Pincus Sanes, MD  Cardiologist:  None  Nephrologist:  Referring MD: Pincus Sanes, MD   CC: Hypertension  History of Present Illness:    Alfred Foster is a 77 y.o. male with a hx of carotid stenosis, DVT, CKD III, hypertension, hyperlipidemia, diabetes mellitus, prostate cancer, OSA on CPAP, here for follow-up. He was initially seen 11/10/2022 in the [facility]. He last saw Dr. Antoine Poche 12/2021 for shortness of breath. He had an ETT that showed frequent PVCs and up to 7 beats of NSVT. His blood pressure was 185/95 at rest, and increased to 213/106 with exertion. It was negative for ischemia. Carotid dopplers at that time revealed mild stenosis bilaterally. Metoprolol was increased. He saw Dr. Lawerance Bach 11/08/2022, and his blood pressure was 150/90. His home blood pressures were mostly elevated but fluctuated. Hydralazine was increased, and he remained on amlodipine, losartan, metoprolol, and spironolactone.  At his initial appointment, he endorsed labile blood pressures despite antihypertensive compliance. He was monitoring at home with a wrist cuff. He complained of sciatic nerve pain mostly radiating distally to his left foot. He had occasional dizziness and shortness of breath when bending over, more frequent with his daily work. However he noted overall improvement in his symptoms since his stress test in 12/2021. His hydralazine had recent been increased, and his blood pressure was well controlled in the office. As he wasn't sure if his machine was accurate given labile readings, he wore a 24 hr blood pressure monitor 12/2022 showing overall average blood pressure 154/80 with overall average HR of 79 bpm. Blood pressures were uncontrolled during all periods. He was instructed to increase his hydralazine to 100 mg TID. Renal artery dopplers 11/2022 showed  1-59% stenosis of the left renal artery, no evidence of stenosis in the right. Renin/aldosterone, and TSH levels were normal. Echo 11/2022 revealed LVEF 65-70%, normal diastolic parameters, no significant valvular abnormalities. He also endorsed snoring and his wife had previously noted disordered breathing. He had a sleep study 12/16/2022 that showed severe OSA and he was recommended for CPAP. In 12/2022 he followed up with Gillian Shields, NP and his blood pressure was 119/62. It was noted that he had obtained a new BP cuff and was not yet using it routinely. He denied recurrent shortness of breath.   Today, he is accompanied by his wife. His wife notes that he becomes fatigued and short of breath, especially with going up and downstairs. He denies any lightheadedness with bending over but may be short of breath. This is usually dependent on how intense his activities are that day. He remains busy with his work and with yard work. He is exercising routinely, and also performs some heavy lifting at work. He complains of some back pain with bending over or climbing stairs which he associates with the incisional site of his prior surgery as well as his sciatic nerve. His blood pressure has been pretty good at home and is well controlled in clinic today. In a typical day, he may drink up to 2-3 sodas a day. He doesn't care for drinking coffee. He has started using his CPAP machine. At this time he is not yet using it routinely as he may fall asleep quickly without putting it on. He has not noticed much of a difference in the mornings whether or not he uses the  CPAP. He denies any palpitations, chest pain, peripheral edema, lightheadedness, headaches, syncope, orthopnea, or PND.   Prior Antihypertensives: HCTZ - shortness of breath, dizziness  Past Medical History:  Diagnosis Date   Diabetes mellitus    Hyperlipidemia    Hypertension    Prostate cancer (HCC) 12/12/2005   Dr Laverle Patter   Snoring 11/10/2022     Past Surgical History:  Procedure Laterality Date   COLONOSCOPY  2004 & 2014   Negative ;[facility]   INGUINAL HERNIA REPAIR     PROSTATECTOMY  04/2006   Dr Laverle Patter   UMBILICAL HERNIA REPAIR      Current Medications: Current Meds  Medication Sig   amLODipine (NORVASC) 10 MG tablet Take 1 tablet (10 mg total) by mouth daily.   apixaban (ELIQUIS) 2.5 MG TABS tablet Take 1 tablet (2.5 mg total) by mouth 2 (two) times daily.   Ascorbic Acid (VITAMIN C PO) Take 1 tablet by mouth daily.   aspirin 81 MG tablet Take 81 mg by mouth daily.   Cholecalciferol (VITAMIN D3) 1000 UNITS CAPS Take 1,000 Units by mouth daily.   ciclopirox (PENLAC) 8 % solution Apply topically at bedtime. Apply over nail and surrounding skin. Apply daily over previous coat. After seven (7) days, may remove with alcohol and continue cycle.   dapagliflozin propanediol (FARXIGA) 10 MG TABS tablet Take 1 tablet (10 mg total) by mouth daily before breakfast.   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  Dx: Z61.096   folic acid (FOLVITE) 800 MCG tablet Take 800 mcg by mouth daily.   gabapentin (NEURONTIN) 100 MG capsule Take 1 capsule (100 mg total) by mouth 2 (two) times daily.   glimepiride (AMARYL) 1 MG tablet TAKE 1 TABLET(1 MG) BY MOUTH DAILY WITH BREAKFAST   hydrALAZINE (APRESOLINE) 100 MG tablet Take 1 tablet (100 mg total) by mouth 3 (three) times daily.   Ipratropium-Albuterol (COMBIVENT) 20-100 MCG/ACT AERS respimat Inhale 1 puff into the lungs every 6 (six) hours as needed for wheezing or shortness of breath.   Lancets (ONETOUCH ULTRASOFT) lancets use as directed   losartan (COZAAR) 100 MG tablet Take 1 tablet (100 mg total) by mouth daily.   metoprolol (TOPROL XL) 200 MG 24 hr tablet Take 1 tablet (200 mg total) by mouth daily.   niacin 500 MG tablet Take 500 mg by mouth daily.    Omega-3 Fatty Acids (FISH OIL PO) Take 1,000 mg by mouth daily.   ONE TOUCH  ULTRA TEST test strip use as directed   rosuvastatin (CRESTOR) 20 MG tablet Take 1 tablet (20 mg total) by mouth daily.   spironolactone (ALDACTONE) 25 MG tablet Take 0.5 tablets (12.5 mg total) by mouth daily.     Allergies:   Hydrochlorothiazide and Metformin and related   Social History   Socioeconomic History   Marital status: Married    Spouse name: Not on file   Number of children: Not on file   Years of education: Not on file   Highest education level: Not on file  Occupational History   Not on file  Tobacco Use   Smoking status: Never   Smokeless tobacco: Never  Substance and Sexual Activity   Alcohol use: No   Drug use: No   Sexual activity: Not on file  Other Topics Concern   Not on file  Social History Narrative   Not on file   Social Determinants of Health   Financial Resource  Strain: Low Risk  (07/20/2022)   Overall Financial Resource Strain (CARDIA)    Difficulty of Paying Living Expenses: Not hard at all  Food Insecurity: No Food Insecurity (07/20/2022)   Hunger Vital Sign    Worried About Running Out of Food in the Last Year: Never true    Ran Out of Food in the Last Year: Never true  Transportation Needs: No Transportation Needs (07/20/2022)   PRAPARE - Administrator, Civil Service (Medical): No    Lack of Transportation (Non-Medical): No  Physical Activity: Sufficiently Active (07/20/2022)   Exercise Vital Sign    Days of Exercise per Week: 7 days    Minutes of Exercise per Session: 60 min  Stress: No Stress Concern Present (07/20/2022)   [employer] of Occupational Health - Occupational Stress Questionnaire    Feeling of Stress : Not at all  Social Connections: Socially Integrated (07/20/2022)   Social Connection and Isolation Panel [NHANES]    Frequency of Communication with Friends and Family: More than three times a week    Frequency of Social Gatherings with Friends and Family: Three times a week    Attends Religious Services: More  than 4 times per year    Active Member of Clubs or Organizations: Yes    Attends Engineer, structural: More than 4 times per year    Marital Status: Married     Family History: The patient's family history includes Alzheimer's disease in his father and mother; Diabetes in his brother and father; Heart attack in his brother and maternal aunt; Heart attack (age of onset: 74) in his sister; Heart attack (age of onset: 28) in his mother; Heart disease in his brother; Hypertension in his father and mother. There is no history of Stroke.  ROS:   Please see the history of present illness.    (+) Fatigue (+) Shortness of breath (+) Lower back pain All other systems reviewed and are negative.  EKGs/Labs/Other Studies Reviewed:    24 HR Blood Pressure Monitor  01/03/2023: 24 Hour Ambulatory Blood Pressure Report   Overall Average BP: 153/80 Overall Average HR: 79   Awake Average BP: 152/80 Awake Average HR:80  Asleep Average BP: 156/79 Asleep Average HR: 75  White Coat Period: 143/84, Heart rate 69   Impressions: Uncontrolled hypertension during all periods  Echo  12/08/2022:  1. Left ventricular ejection fraction, by estimation, is 65 to 70%. Left  ventricular ejection fraction by PLAX is 67 %. The left ventricle has  normal function. The left ventricle has no regional wall motion  abnormalities. Left ventricular diastolic  parameters were normal.   2. Right ventricular systolic function is normal. The right ventricular  size is normal. Tricuspid regurgitation signal is inadequate for assessing  PA pressure.   3. The mitral valve is abnormal. Trivial mitral valve regurgitation.   4. The aortic valve is tricuspid. Aortic valve regurgitation is not  visualized.   5. The inferior vena cava is normal in size with greater than 50%  respiratory variability, suggesting right atrial pressure of 3 mmHg.   Comparison(s): No prior Echocardiogram.   Renal Artery Doppler   12/08/2022: Summary:  Renal:    Right: Abnormal right Resistive Index. Normal cortical thickness of         right kidney. Abnormal size for the right kidney. RRV flow         present. No evidence of right renal artery stenosis.  Left:  1-59% stenosis of the left  renal artery. Abnormal left         Resisitve Index. Normal cortical thickness of the left         kidney. Abnormal size for the left kidney. LRV flow present.  Mesenteric:  Areas of limited visceral study include right kidney size, left kidney size and mesenteric arteries.   ETT  12/31/2021:   No ST deviation was noted.   ETT with fair exercise tolerance (9:00); no chest pain; hypertensive blood pressure response; no diagnostic ST changes; frequent PVCs and 7 beats nonsustained ventricular tachycardia with exercise; negative adequate ETT.  Bilateral Carotid Dopplers  12/17/2021: Summary:  Right Carotid: Velocities in the right ICA are consistent with a 1-39%  stenosis.   Left Carotid: Velocities in the left ICA are consistent with a 1-39%  stenosis.               Non-hemodynamically significant plaque <50% noted in the  CCA.   Vertebrals: Bilateral vertebral arteries demonstrate antegrade flow.  Subclavians: Normal flow hemodynamics were seen in bilateral subclavian               arteries.    EKG:  EKG is personally reviewed. 05/03/2023:  EKG was not ordered. 11/10/2022: Sinus tachycardia. PACs. Rate 103 bpm.  Recent Labs: 11/08/2022: ALT 18; BUN 21; Creatinine, Ser 1.48; Hemoglobin 14.9; Platelets 267.0; Potassium 4.3; Sodium 143 11/17/2022: TSH 0.815   Recent Lipid Panel    Component Value Date/Time   CHOL 126 11/08/2022 1053   TRIG 64.0 11/08/2022 1053   HDL 39.40 11/08/2022 1053   CHOLHDL 3 11/08/2022 1053   VLDL 12.8 11/08/2022 1053   LDLCALC 74 11/08/2022 1053    Physical Exam:    VS:  BP 116/66 (BP Location: Right Arm, Patient Position: Sitting, Cuff Size: Normal)   Pulse 80   Ht 6\' 1"  (1.854 m)    Wt 209 lb 6.4 oz (95 kg)   BMI 27.63 kg/m  , BMI Body mass index is 27.63 kg/m. GENERAL:  Well appearing HEENT: Pupils equal round and reactive, fundi not visualized, oral mucosa unremarkable NECK:  No jugular venous distention, waveform within normal limits, carotid upstroke brisk and symmetric, no bruits, no thyromegaly LUNGS:  Clear to auscultation bilaterally HEART:  RRR with frequent ectopy.  PMI not displaced or sustained,S1 and S2 within normal limits, no S3, no S4, no clicks, no rubs, no murmurs ABD:  Flat, positive bowel sounds normal in frequency in pitch, no bruits, no rebound, no guarding, no midline pulsatile mass, no hepatomegaly, no splenomegaly EXT:  2 plus pulses throughout, no edema, no cyanosis no clubbing SKIN:  No rashes no nodules NEURO:  Cranial nerves II through XII grossly intact, motor grossly intact throughout PSYCH:  Cognitively intact, oriented to person place and time   ASSESSMENT/PLAN:    #  Hypertension: - Patient reports good compliance with medications, including hydralazine 100 mg three times a day. - Blood pressure well-controlled during today's visit. - Continue amlodipine, hydralazine, losartan, metoprolol and spironolactone and monitor blood pressure regularly.  # Tachycardia: - Heart rate initially elevated at 101 bpm, but rechecked and found to be 80 bpm. - Recommend limiting caffeine to 1 cup daily. - Labs show normal thyroid function, no anemia, and stable kidney function. - Continue to monitor heart rate and consider further evaluation if tachycardia persists. - Continue metoprolol.   # Shortness of Breath and Fatigue: - Patient reports occasional shortness of breath and fatigue, but no chest pain or pressure. -  Stress test and echocardiogram from one year ago were normal. - No significant change in symptoms over the past year. - Continue to monitor symptoms and encourage regular exercise.  # Sleep Apnea: - Patient using CPAP machine  intermittently. - Encourage consistent use of CPAP machine and follow up with sleep specialist as needed.   Screening for Secondary Hypertension:     11/10/2022   11:53 AM  Causes  Drugs/Herbals Screened     - Comments occasional soda, no EtOH, limits salt with cooking but eats out.  No NSAIDs  Renovascular HTN Screened     - Comments check renal Doppler  Sleep Apnea Screened     - Comments check sleep study  Thyroid Disease Screened  Hyperaldosteronism Screened  Pheochromocytoma N/A  Coarctation of the Aorta Screened     - Comments BP symmetric  Compliance Screened     - Comments Unclear about whether he is taking amloidpine    Relevant Labs/Studies:    Latest Ref Rng & Units 11/08/2022   10:53 AM 07/08/2022    2:51 PM 04/08/2022    8:18 AM  Basic Labs  Sodium 135 - 145 mEq/L 143  142  140   Potassium 3.5 - 5.1 mEq/L 4.3  3.6  4.6   Creatinine 0.40 - 1.50 mg/dL 1.61  0.96  0.45        Latest Ref Rng & Units 11/17/2022    9:53 AM 10/09/2020    8:30 AM  Thyroid   TSH 0.450 - 4.500 uIU/mL 0.815  0.83        Latest Ref Rng & Units 11/17/2022    9:53 AM  Renin/Aldosterone   Aldosterone 0.0 - 30.0 ng/dL <4.0   Aldos/Renin Ratio 0.0 - 30.0 <.4              12/08/2022    8:16 AM  Renovascular   Renal Artery [place] Completed Yes     Disposition:    FU with Chun Sellen C. Duke Salvia, MD, [facility] in 6 months. - If any issues arise with blood pressure or other symptoms, patient may return sooner for evaluation. - If everything remains stable at the six-month follow-up, patient may return to following up with Dr. Antoine Poche.  Medication Adjustments/Labs and Tests Ordered: Current medicines are reviewed at length with the patient today.  Concerns regarding medicines are outlined above.   No orders of the defined types were placed in this encounter.  No orders of the defined types were placed in this encounter.  I,Mathew Stumpf,acting as a Neurosurgeon for Chilton Si, MD.,have  documented all relevant documentation on the behalf of Chilton Si, MD,as directed by  Chilton Si, MD while in the presence of Chilton Si, MD.  I, Kaizen Ibsen C. Duke Salvia, MD have reviewed all documentation for this visit.  The documentation of the exam, diagnosis, procedures, and orders on 05/03/2023 are all accurate and complete.  Signed, Chilton Si, MD  05/03/2023 9:10 AM    [facility]

## 2023-05-03 NOTE — Patient Instructions (Addendum)
Medication Instructions:  Your physician recommends that you continue on your current medications as directed. Please refer to the Current Medication list given to you today.   Labwork: NONE  Testing/Procedures: NONE  Follow-Up: 10/25/2023 AT 8:00 AM WITH DR [facility]   If you need a refill on your cardiac medications before your next appointment, please call your pharmacy.

## 2023-05-11 ENCOUNTER — Encounter: Payer: Self-pay | Admitting: Internal Medicine

## 2023-05-11 NOTE — Progress Notes (Signed)
Subjective:    Patient ID: Alfred Foster, male    DOB: Feb 08, 1946, 77 y.o.   MRN: [id number]     HPI Cleon is here for follow up of his chronic medical problems.  Has lost some weight  Lower back pain - physical at work and home.  Intermittent radiation to left foot.  Only feels it when he does certain activities.   Medications and allergies reviewed with patient and updated if appropriate.  Current Outpatient Medications on File Prior to Visit  Medication Sig Dispense Refill   amLODipine (NORVASC) 10 MG tablet Take 1 tablet (10 mg total) by mouth daily. 90 tablet 3   apixaban (ELIQUIS) 2.5 MG TABS tablet Take 1 tablet (2.5 mg total) by mouth 2 (two) times daily. 120 tablet 2   Ascorbic Acid (VITAMIN C PO) Take 1 tablet by mouth daily.     aspirin 81 MG tablet Take 81 mg by mouth daily.     Cholecalciferol (VITAMIN D3) 1000 UNITS CAPS Take 1,000 Units by mouth daily.     ciclopirox (PENLAC) 8 % solution Apply topically at bedtime. Apply over nail and surrounding skin. Apply daily over previous coat. After seven (7) days, may remove with alcohol and continue cycle. 6.6 mL 2   dapagliflozin propanediol (FARXIGA) 10 MG TABS tablet Take 1 tablet (10 mg total) by mouth daily before breakfast. 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  Dx: Q65.784 2 each 0   folic acid (FOLVITE) 800 MCG tablet Take 800 mcg by mouth daily.     gabapentin (NEURONTIN) 100 MG capsule Take 1 capsule (100 mg total) by mouth 2 (two) times daily. 60 capsule 5   glimepiride (AMARYL) 1 MG tablet TAKE 1 TABLET(1 MG) BY MOUTH DAILY WITH BREAKFAST 30 tablet 5   hydrALAZINE (APRESOLINE) 100 MG tablet Take 1 tablet (100 mg total) by mouth 3 (three) times daily. 270 tablet 3   Ipratropium-Albuterol (COMBIVENT) 20-100 MCG/ACT AERS respimat Inhale 1 puff into the lungs every 6 (six) hours as needed for wheezing or shortness of breath. 4 g 0    Lancets (ONETOUCH ULTRASOFT) lancets use as directed 100 each 1   losartan (COZAAR) 100 MG tablet Take 1 tablet (100 mg total) by mouth daily. 90 tablet 3   metoprolol (TOPROL XL) 200 MG 24 hr tablet Take 1 tablet (200 mg total) by mouth daily. 90 tablet 3   niacin 500 MG tablet Take 500 mg by mouth daily.      Omega-3 Fatty Acids (FISH OIL PO) Take 1,000 mg by mouth daily.     ONE TOUCH ULTRA TEST test strip use as directed 100 each 1   rosuvastatin (CRESTOR) 20 MG tablet Take 1 tablet (20 mg total) by mouth daily. 90 tablet 3   spironolactone (ALDACTONE) 25 MG tablet Take 0.5 tablets (12.5 mg total) by mouth daily. 30 tablet 5   No current facility-administered medications on file prior to visit.     Review of Systems  Constitutional:  Negative for fever.  Respiratory:  Positive for shortness of breath (when bending over and coming back up). Negative for cough and wheezing.   Cardiovascular:  Negative for chest pain, palpitations and leg swelling.  Neurological:  Positive for dizziness (when he stands or bends). Negative for headaches.       Objective:   Vitals:   05/12/23 0751  BP: 126/78  Pulse:  75  Temp: 98.1 F (36.7 C)  SpO2: 98%   BP Readings from Last 3 Encounters:  05/12/23 126/78  05/03/23 116/66  12/29/22 119/62   Wt Readings from Last 3 Encounters:  05/12/23 207 lb 3.2 oz (94 kg)  05/03/23 209 lb 6.4 oz (95 kg)  12/29/22 213 lb (96.6 kg)   Body mass index is 27.34 kg/m.    Physical Exam Constitutional:      General: He is not in acute distress.    Appearance: Normal appearance. He is not ill-appearing.  HENT:     Head: Normocephalic and atraumatic.  Eyes:     Conjunctiva/sclera: Conjunctivae normal.  Cardiovascular:     Rate and Rhythm: Normal rate and regular rhythm.     Heart sounds: Normal heart sounds.  Pulmonary:     Effort: Pulmonary effort is normal. No respiratory distress.     Breath sounds: Normal breath sounds. No wheezing or rales.   Musculoskeletal:     Right lower leg: No edema.     Left lower leg: No edema.  Skin:    General: Skin is warm and dry.     Findings: No rash.  Neurological:     Mental Status: He is alert. Mental status is at baseline.  Psychiatric:        Mood and Affect: Mood normal.        Lab Results  Component Value Date   WBC 6.7 11/08/2022   HGB 14.9 11/08/2022   HCT 42.8 11/08/2022   PLT 267.0 11/08/2022   GLUCOSE 104 (H) 11/08/2022   CHOL 126 11/08/2022   TRIG 64.0 11/08/2022   HDL 39.40 11/08/2022   LDLCALC 74 11/08/2022   ALT 18 11/08/2022   AST 20 11/08/2022   NA 143 11/08/2022   K 4.3 11/08/2022   CL 111 11/08/2022   CREATININE 1.48 11/08/2022   BUN 21 11/08/2022   CO2 24 11/08/2022   TSH 0.815 11/17/2022   PSA 0.00 (L) 03/04/2022   INR 1.01 12/14/2012   HGBA1C 6.7 (H) 11/08/2022   MICROALBUR 1.9 03/04/2022     Assessment & Plan:    See Problem List for Assessment and Plan of chronic medical problems.

## 2023-05-11 NOTE — Patient Instructions (Addendum)
      Blood work was ordered.   The lab is on the first floor.    Medications changes include :   none      Return in about 6 months (around 11/11/2023) for Physical Exam.

## 2023-05-12 ENCOUNTER — Ambulatory Visit (INDEPENDENT_AMBULATORY_CARE_PROVIDER_SITE_OTHER): Payer: Medicare Other | Admitting: Internal Medicine

## 2023-05-12 VITALS — BP 126/78 | HR 75 | Temp 98.1°F | Ht 73.0 in | Wt 207.2 lb

## 2023-05-12 DIAGNOSIS — Z125 Encounter for screening for malignant neoplasm of prostate: Secondary | ICD-10-CM

## 2023-05-12 DIAGNOSIS — N1832 Chronic kidney disease, stage 3b: Secondary | ICD-10-CM | POA: Diagnosis not present

## 2023-05-12 DIAGNOSIS — E1142 Type 2 diabetes mellitus with diabetic polyneuropathy: Secondary | ICD-10-CM | POA: Diagnosis not present

## 2023-05-12 DIAGNOSIS — E782 Mixed hyperlipidemia: Secondary | ICD-10-CM

## 2023-05-12 DIAGNOSIS — E1165 Type 2 diabetes mellitus with hyperglycemia: Secondary | ICD-10-CM | POA: Diagnosis not present

## 2023-05-12 DIAGNOSIS — I6523 Occlusion and stenosis of bilateral carotid arteries: Secondary | ICD-10-CM

## 2023-05-12 DIAGNOSIS — I10 Essential (primary) hypertension: Secondary | ICD-10-CM

## 2023-05-12 DIAGNOSIS — D6869 Other thrombophilia: Secondary | ICD-10-CM | POA: Diagnosis not present

## 2023-05-12 DIAGNOSIS — Z86718 Personal history of other venous thrombosis and embolism: Secondary | ICD-10-CM

## 2023-05-12 LAB — COMPREHENSIVE METABOLIC PANEL
ALT: 19 U/L (ref 0–53)
AST: 19 U/L (ref 0–37)
Albumin: 4.2 g/dL (ref 3.5–5.2)
Alkaline Phosphatase: 81 U/L (ref 39–117)
BUN: 31 mg/dL — ABNORMAL HIGH (ref 6–23)
CO2: 23 mEq/L (ref 19–32)
Calcium: 9.5 mg/dL (ref 8.4–10.5)
Chloride: 108 mEq/L (ref 96–112)
Creatinine, Ser: 2.12 mg/dL — ABNORMAL HIGH (ref 0.40–1.50)
GFR: 29.81 mL/min — ABNORMAL LOW (ref >60.00)
Glucose, Bld: 111 mg/dL — ABNORMAL HIGH (ref 70–99)
Potassium: 4.9 mEq/L (ref 3.5–5.1)
Sodium: 138 mEq/L (ref 135–145)
Total Bilirubin: 0.7 mg/dL (ref 0.2–1.2)
Total Protein: 6.9 g/dL (ref 6.0–8.3)

## 2023-05-12 LAB — MICROALBUMIN / CREATININE URINE RATIO
Creatinine,U: 262.2 mg/dL
Microalb Creat Ratio: 0.4 mg/g (ref 0.0–30.0)
Microalb, Ur: 1.1 mg/dL (ref 0.0–1.9)

## 2023-05-12 LAB — PSA, MEDICARE: PSA: 0 ng/ml — ABNORMAL LOW (ref 0.10–4.00)

## 2023-05-12 LAB — HEMOGLOBIN A1C: Hgb A1c MFr Bld: 6.2 % (ref 4.6–6.5)

## 2023-05-12 LAB — CBC WITH DIFFERENTIAL/PLATELET
Basophils Absolute: 0 10*3/uL (ref 0.0–0.1)
Basophils Relative: 0.8 % (ref 0.0–3.0)
Eosinophils Absolute: 0.1 10*3/uL (ref 0.0–0.7)
Eosinophils Relative: 1.8 % (ref 0.0–5.0)
HCT: 41.8 % (ref 39.0–52.0)
Hemoglobin: 13.9 g/dL (ref 13.0–17.0)
Lymphocytes Relative: 27 % (ref 12.0–46.0)
Lymphs Abs: 1.6 10*3/uL (ref 0.7–4.0)
MCHC: 33.2 g/dL (ref 30.0–36.0)
MCV: 96.5 fl (ref 78.0–100.0)
Monocytes Absolute: 0.6 10*3/uL (ref 0.1–1.0)
Monocytes Relative: 10.2 % (ref 3.0–12.0)
Neutro Abs: 3.6 10*3/uL (ref 1.4–7.7)
Neutrophils Relative %: 60.2 % (ref 43.0–77.0)
Platelets: 258 10*3/uL (ref 150.0–400.0)
RBC: 4.34 Mil/uL (ref 4.22–5.81)
RDW: 14.2 % (ref 11.5–15.5)
WBC: 5.9 10*3/uL (ref 4.0–10.5)

## 2023-05-12 LAB — LIPID PANEL
Cholesterol: 121 mg/dL (ref 0–200)
HDL: 36.8 mg/dL — ABNORMAL LOW (ref >39.00)
LDL Cholesterol: 70 mg/dL (ref 0–99)
NonHDL: 84.68
Total CHOL/HDL Ratio: 3
Triglycerides: 74 mg/dL (ref 0.0–149.0)
VLDL: 14.8 mg/dL (ref 0.0–40.0)

## 2023-05-12 NOTE — Assessment & Plan Note (Signed)
Chronic Regular exercise and healthy diet encouraged Check lipid panel today Continue Crestor 20 mg daily

## 2023-05-12 NOTE — Assessment & Plan Note (Addendum)
asymptomatic He is not sure why he is taking the gabapentin-advised it was either for his neuropathy or lumbar radiculopathy-he will try stopping this and seeing if he really needs it

## 2023-05-12 NOTE — Assessment & Plan Note (Signed)
Chronic Blood pressure controlled Discussed goal of BP less than 130/80 consistently Stressed low-sodium diet Encouraged more exercise-he is active, but is not exercising regularly Encouraged weight loss CMP Continue amlodipine 10 mg daily, losartan 100 mg daily, metoprolol XL 200 mg daily, spironolactone 12.5 mg daily, hydralazine to 100 mg 3 times daily

## 2023-05-12 NOTE — Assessment & Plan Note (Signed)
Chronic History of DVT x 2 Lifelong AC Continue Eliquis 2.5 mg twice daily CBC, CMP 

## 2023-05-12 NOTE — Assessment & Plan Note (Signed)
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

## 2023-05-12 NOTE — Assessment & Plan Note (Signed)
Chronic   Lab Results  Component Value Date   HGBA1C 6.7 (H) 11/08/2022   Sugars controlled Testing sugars 1 times a day Check A1c  Continue glimepiride 1 mg daily, Farxiga 10 mg daily Stressed regular exercise, diabetic diet

## 2023-05-12 NOTE — Assessment & Plan Note (Signed)
Chronic °History of DVT x2 °Continue Eliquis 5 mg twice daily °

## 2023-05-12 NOTE — Assessment & Plan Note (Signed)
Chronic Stressed good blood pressure, sugar control Encouraged weight loss No NSAIDs Continue increased water intake CBC, CMP

## 2023-05-14 NOTE — Addendum Note (Signed)
Addended by: Pincus Sanes on: 05/14/2023 03:51 PM   Modules accepted: Orders

## 2023-05-16 LAB — BASIC METABOLIC PANEL
BUN: 22 mg/dL (ref 6–23)
CO2: 23 mEq/L (ref 19–32)
Calcium: 9.4 mg/dL (ref 8.4–10.5)
Chloride: 113 mEq/L — ABNORMAL HIGH (ref 96–112)
Creatinine, Ser: 1.69 mg/dL — ABNORMAL HIGH (ref 0.40–1.50)
GFR: 39.13 mL/min — ABNORMAL LOW (ref >60.00)
Glucose, Bld: 94 mg/dL (ref 70–99)
Potassium: 4.6 mEq/L (ref 3.5–5.1)
Sodium: 143 mEq/L (ref 135–145)

## 2023-05-18 DIAGNOSIS — G4733 Obstructive sleep apnea (adult) (pediatric): Secondary | ICD-10-CM | POA: Diagnosis not present

## 2023-05-22 ENCOUNTER — Ambulatory Visit (INDEPENDENT_AMBULATORY_CARE_PROVIDER_SITE_OTHER): Payer: Medicare Other | Admitting: Podiatry

## 2023-05-22 DIAGNOSIS — M2042 Other hammer toe(s) (acquired), left foot: Secondary | ICD-10-CM | POA: Diagnosis not present

## 2023-05-22 DIAGNOSIS — M2141 Flat foot [pes planus] (acquired), right foot: Secondary | ICD-10-CM | POA: Diagnosis not present

## 2023-05-22 DIAGNOSIS — M2041 Other hammer toe(s) (acquired), right foot: Secondary | ICD-10-CM | POA: Diagnosis not present

## 2023-05-22 DIAGNOSIS — E119 Type 2 diabetes mellitus without complications: Secondary | ICD-10-CM

## 2023-05-22 DIAGNOSIS — M2142 Flat foot [pes planus] (acquired), left foot: Secondary | ICD-10-CM | POA: Diagnosis not present

## 2023-05-22 DIAGNOSIS — L84 Corns and callosities: Secondary | ICD-10-CM

## 2023-05-22 NOTE — Progress Notes (Signed)
Patient presents today to pick up diabetic shoes and insoles.  Patient was dispensed 1 pair of diabetic shoes and 3 pairs of foam casted diabetic insoles. He tried on the shoes with the insoles and they fit satisfactorily. Medicare Guidelines were given as well as wearing instructions.    ABN was signed.  Marland Kitchen

## 2023-05-23 ENCOUNTER — Other Ambulatory Visit: Payer: Self-pay | Admitting: Internal Medicine

## 2023-05-24 DIAGNOSIS — N1832 Chronic kidney disease, stage 3b: Secondary | ICD-10-CM | POA: Diagnosis not present

## 2023-05-25 ENCOUNTER — Telehealth: Payer: Self-pay | Admitting: Internal Medicine

## 2023-05-25 NOTE — Telephone Encounter (Signed)
Patient's wife called to let Dr. Lawerance Bach know that the patient had his tetanus shot on 05/16/2023. Best callback is [phone].

## 2023-05-25 NOTE — Telephone Encounter (Signed)
Pulled and updated.

## 2023-06-02 ENCOUNTER — Ambulatory Visit: Payer: Medicare Other | Admitting: Podiatry

## 2023-06-02 DIAGNOSIS — M79674 Pain in right toe(s): Secondary | ICD-10-CM

## 2023-06-02 DIAGNOSIS — B351 Tinea unguium: Secondary | ICD-10-CM | POA: Diagnosis not present

## 2023-06-02 DIAGNOSIS — M79675 Pain in left toe(s): Secondary | ICD-10-CM | POA: Diagnosis not present

## 2023-06-02 DIAGNOSIS — D689 Coagulation defect, unspecified: Secondary | ICD-10-CM

## 2023-06-02 DIAGNOSIS — E119 Type 2 diabetes mellitus without complications: Secondary | ICD-10-CM | POA: Diagnosis not present

## 2023-06-03 NOTE — Progress Notes (Signed)
Subjective: Chief Complaint  Patient presents with   Nail Problem    Routine Foot Care- Nail trim     77 y.o. returns the office today for painful, elongated, thickened toenails which he cannot trim himself. Denies any redness or drainage around the nails.  The nails grow out long and they cause discomfort.  No open lesions.  He is on Eliquis  PCP: Pincus Sanes, MD Last Seen: May 12, 2023 A1c: 6.2 on May 12, 2023  Objective: AAO 3, NAD DP pulse 2/4, PT 1/4 b/l, RT < 3 seconds Nails hypertrophic, dystrophic, elongated, brittle, discolored 10. There is tenderness overlying the nails 1-5 bilaterally. There is no surrounding erythema or drainage along the nail sites. Decreased range of motion of the first MPJ bilaterally right side worse than left.  No significant pain at this area. No pain with calf compression, swelling, warmth, erythema.  Assessment: Patient presents with symptomatic onychomycosis  Plan: -Treatment options including alternatives, risks, complications were discussed -Nails sharply debrided 10 without complication/bleeding. Penlac for the nail fungus.  -Daily foot inspection. -Follow-up in 3 months or sooner if any problems are to arise. In the meantime, encouraged to call the office with any questions, concerns, changes symptoms.  Ovid Curd, DPM

## 2023-06-17 DIAGNOSIS — G4733 Obstructive sleep apnea (adult) (pediatric): Secondary | ICD-10-CM | POA: Diagnosis not present

## 2023-07-14 ENCOUNTER — Ambulatory Visit (INDEPENDENT_AMBULATORY_CARE_PROVIDER_SITE_OTHER): Payer: Medicare Other

## 2023-07-14 VITALS — Ht 73.0 in | Wt 207.0 lb

## 2023-07-14 DIAGNOSIS — Z1211 Encounter for screening for malignant neoplasm of colon: Secondary | ICD-10-CM

## 2023-07-14 DIAGNOSIS — Z1212 Encounter for screening for malignant neoplasm of rectum: Secondary | ICD-10-CM

## 2023-07-14 DIAGNOSIS — Z0000 Encounter for general adult medical examination without abnormal findings: Secondary | ICD-10-CM

## 2023-07-14 NOTE — Patient Instructions (Signed)
[name] , Thank you for taking time to come for your Medicare Wellness Visit. I appreciate your ongoing commitment to your health goals. Please review the following plan we discussed and let me know if I can assist you in the future.   Referrals/Orders/Follow-Ups/Clinician Recommendations: Remember to call [facility] on [address] [phone]) to schedule your colonoscopy.  Also remember to schedule your AWV when you are at your office visit.  Keep up the good work.  This is a list of the screening recommended for you and due dates:  Health Maintenance  Topic Date Due   Flu Shot  10/11/2023*   Eye exam for diabetics  07/15/2023   Hemoglobin A1C  11/11/2023   Complete foot exam   02/24/2024   Yearly kidney health urinalysis for diabetes  05/11/2024   Yearly kidney function blood test for diabetes  05/15/2024   Medicare Annual Wellness Visit  07/13/2024   DTaP/Tdap/Td vaccine (3 - Td or Tdap) 05/15/2033   Pneumonia Vaccine  Completed   Hepatitis C Screening  Completed   Zoster (Shingles) Vaccine  Completed   HPV Vaccine  Aged Out   Colon Cancer Screening  Discontinued   COVID-19 Vaccine  Discontinued  *Topic was postponed. The date shown is not the original due date.    Advanced directives: (Copy Requested) Please bring a copy of your health care power of attorney and living will to the office to be added to your chart at your convenience.  Next Medicare Annual Wellness Visit scheduled for next year: No  Preventive Care 76 Years and Older, Male  Preventive care refers to lifestyle choices and visits with your health care provider that can promote health and wellness. What does preventive care include? A yearly physical exam. This is also called an annual well check. Dental exams once or twice a year. Routine eye exams. Ask your health care provider how often you should have your eyes checked. Personal lifestyle choices, including: Daily care of your teeth and gums. Regular physical  activity. Eating a healthy diet. Avoiding tobacco and drug use. Limiting alcohol use. Practicing safe sex. Taking low doses of aspirin every day. Taking vitamin and mineral supplements as recommended by your health care provider. What happens during an annual well check? The services and screenings done by your health care provider during your annual well check will depend on your age, overall health, lifestyle risk factors, and family history of disease. Counseling  Your health care provider may ask you questions about your: Alcohol use. Tobacco use. Drug use. Emotional well-being. Home and relationship well-being. Sexual activity. Eating habits. History of falls. Memory and ability to understand (cognition). Work and work Astronomer. Screening  You may have the following tests or measurements: Height, weight, and BMI. Blood pressure. Lipid and cholesterol levels. These may be checked every 5 years, or more frequently if you are over 70 years old. Skin check. Lung cancer screening. You may have this screening every year starting at age 76 if you have a 30-pack-year history of smoking and currently smoke or have quit within the past 15 years. Fecal occult blood test (FOBT) of the stool. You may have this test every year starting at age 76. Flexible sigmoidoscopy or colonoscopy. You may have a sigmoidoscopy every 5 years or a colonoscopy every 10 years starting at age 17. Prostate cancer screening. Recommendations will vary depending on your family history and other risks. Hepatitis C blood test. Hepatitis B blood test. Sexually transmitted disease (STD) testing. Diabetes  screening. This is done by checking your blood sugar (glucose) after you have not eaten for a while (fasting). You may have this done every 1-3 years. Abdominal aortic aneurysm (AAA) screening. You may need this if you are a current or former smoker. Osteoporosis. You may be screened starting at age 76 if you are  at high risk. Talk with your health care provider about your test results, treatment options, and if necessary, the need for more tests. Vaccines  Your health care provider may recommend certain vaccines, such as: Influenza vaccine. This is recommended every year. Tetanus, diphtheria, and acellular pertussis (Tdap, Td) vaccine. You may need a Td booster every 10 years. Zoster vaccine. You may need this after age 76. Pneumococcal 13-valent conjugate (PCV13) vaccine. One dose is recommended after age 76. Pneumococcal polysaccharide (PPSV23) vaccine. One dose is recommended after age 76. Talk to your health care provider about which screenings and vaccines you need and how often you need them. This information is not intended to replace advice given to you by your health care provider. Make sure you discuss any questions you have with your health care provider. Document Released: 12/25/2015 Document Revised: 08/17/2016 Document Reviewed: 09/29/2015 Elsevier Interactive Patient Education  2017 [employer].  Fall Prevention in the Home Falls can cause injuries. They can happen to people of all ages. There are many things you can do to make your home safe and to help prevent falls. What can I do on the outside of my home? Regularly fix the edges of walkways and driveways and fix any cracks. Remove anything that might make you trip as you walk through a door, such as a raised step or threshold. Trim any bushes or trees on the path to your home. Use bright outdoor lighting. Clear any walking paths of anything that might make someone trip, such as rocks or tools. Regularly check to see if handrails are loose or broken. Make sure that both sides of any steps have handrails. Any raised decks and porches should have guardrails on the edges. Have any leaves, snow, or ice cleared regularly. Use sand or salt on walking paths during winter. Clean up any spills in your garage right away. This includes oil  or grease spills. What can I do in the bathroom? Use night lights. Install grab bars by the toilet and in the tub and shower. Do not use towel bars as grab bars. Use non-skid mats or decals in the tub or shower. If you need to sit down in the shower, use a plastic, non-slip stool. Keep the floor dry. Clean up any water that spills on the floor as soon as it happens. Remove soap buildup in the tub or shower regularly. Attach bath mats securely with double-sided non-slip rug tape. Do not have throw rugs and other things on the floor that can make you trip. What can I do in the bedroom? Use night lights. Make sure that you have a light by your bed that is easy to reach. Do not use any sheets or blankets that are too big for your bed. They should not hang down onto the floor. Have a firm chair that has side arms. You can use this for support while you get dressed. Do not have throw rugs and other things on the floor that can make you trip. What can I do in the kitchen? Clean up any spills right away. Avoid walking on wet floors. Keep items that you use a lot in easy-to-reach places.  If you need to reach something above you, use a strong step stool that has a grab bar. Keep electrical cords out of the way. Do not use floor polish or wax that makes floors slippery. If you must use wax, use non-skid floor wax. Do not have throw rugs and other things on the floor that can make you trip. What can I do with my stairs? Do not leave any items on the stairs. Make sure that there are handrails on both sides of the stairs and use them. Fix handrails that are broken or loose. Make sure that handrails are as long as the stairways. Check any carpeting to make sure that it is firmly attached to the stairs. Fix any carpet that is loose or worn. Avoid having throw rugs at the top or bottom of the stairs. If you do have throw rugs, attach them to the floor with carpet tape. Make sure that you have a light  switch at the top of the stairs and the bottom of the stairs. If you do not have them, ask someone to add them for you. What else can I do to help prevent falls? Wear shoes that: Do not have high heels. Have rubber bottoms. Are comfortable and fit you well. Are closed at the toe. Do not wear sandals. If you use a stepladder: Make sure that it is fully opened. Do not climb a closed stepladder. Make sure that both sides of the stepladder are locked into place. Ask someone to hold it for you, if possible. Clearly mark and make sure that you can see: Any grab bars or handrails. First and last steps. Where the edge of each step is. Use tools that help you move around (mobility aids) if they are needed. These include: Canes. Walkers. Scooters. Crutches. Turn on the lights when you go into a dark area. Replace any light bulbs as soon as they burn out. Set up your furniture so you have a clear path. Avoid moving your furniture around. If any of your floors are uneven, fix them. If there are any pets around you, be aware of where they are. Review your medicines with your doctor. Some medicines can make you feel dizzy. This can increase your chance of falling. Ask your doctor what other things that you can do to help prevent falls. This information is not intended to replace advice given to you by your health care provider. Make sure you discuss any questions you have with your health care provider. Document Released: 09/24/2009 Document Revised: 05/05/2016 Document Reviewed: 01/02/2015 Elsevier Interactive Patient Education  2017 [employer].

## 2023-07-14 NOTE — Progress Notes (Signed)
Subjective:   Alfred Foster is a 77 y.o. male who presents for Medicare Annual/Subsequent preventive examination.  Visit Complete: Virtual  I connected with  Alfred Foster on 07/14/23 by a audio enabled telemedicine application and verified that I am speaking with the correct person using two identifiers.  Patient Location: Home  Provider Location: Home Office  I discussed the limitations of evaluation and management by telemedicine. The patient expressed understanding and agreed to proceed.  Vital Signs: Per patient no change in vitals since last visit.   Review of Systems    Cardiac Risk Factors include: advanced age (>19men, >69 women);male gender;dyslipidemia;hypertension;Other (see comment);diabetes mellitus, Risk factor comments: CAD, CKD, OSA     Objective:    Today's Vitals   07/14/23 1513  Weight: 207 lb (93.9 kg)  Height: 6\' 1"  (1.854 m)   Body mass index is 27.31 kg/m.     07/14/2023    3:20 PM 07/20/2022    4:08 PM 07/30/2021   10:04 AM 10/16/2020    3:47 PM 12/10/2019    4:55 AM 12/09/2019   10:45 AM 09/11/2018    8:28 PM  Advanced Directives  Does Patient Have a Medical Advance Directive? Yes Yes No No  No No  Type of Estate agent of [place];Living will Living will;Healthcare Power of Attorney       Does patient want to make changes to medical advance directive?  No - Patient declined       Copy of Healthcare Power of Attorney in Chart? No - copy requested No - copy requested       Would patient like information on creating a medical advance directive?   No - Patient declined Yes (MAU/Ambulatory/Procedural Areas - Information given) No - Patient declined  No - Patient declined    Current Medications (verified) Outpatient Encounter Medications as of 07/14/2023  Medication Sig   amLODipine (NORVASC) 10 MG tablet Take 1 tablet (10 mg total) by mouth daily.   apixaban (ELIQUIS) 2.5 MG TABS tablet Take 1 tablet (2.5 mg total) by mouth 2  (two) times daily.   Ascorbic Acid (VITAMIN C PO) Take 1 tablet by mouth daily.   aspirin 81 MG tablet Take 81 mg by mouth daily.   Cholecalciferol (VITAMIN D3) 1000 UNITS CAPS Take 1,000 Units by mouth daily.   ciclopirox (PENLAC) 8 % solution Apply topically at bedtime. Apply over nail and surrounding skin. Apply daily over previous coat. After seven (7) days, may remove with alcohol and continue cycle.   dapagliflozin propanediol (FARXIGA) 10 MG TABS tablet Take 1 tablet (10 mg total) by mouth daily before breakfast.   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  Dx: Z61.096   folic acid (FOLVITE) 800 MCG tablet Take 800 mcg by mouth daily.   gabapentin (NEURONTIN) 100 MG capsule Take 1 capsule (100 mg total) by mouth 2 (two) times daily.   glimepiride (AMARYL) 1 MG tablet TAKE 1 TABLET(1 MG) BY MOUTH DAILY WITH BREAKFAST   hydrALAZINE (APRESOLINE) 100 MG tablet Take 1 tablet (100 mg total) by mouth 3 (three) times daily.   Ipratropium-Albuterol (COMBIVENT) 20-100 MCG/ACT AERS respimat Inhale 1 puff into the lungs every 6 (six) hours as needed for wheezing or shortness of breath.   Lancets (ONETOUCH ULTRASOFT) lancets use as directed   losartan (COZAAR) 100 MG tablet Take 1 tablet (100 mg total) by mouth daily.   metoprolol (TOPROL XL) 200 MG  24 hr tablet Take 1 tablet (200 mg total) by mouth daily.   niacin 500 MG tablet Take 500 mg by mouth daily.    Omega-3 Fatty Acids (FISH OIL PO) Take 1,000 mg by mouth daily.   ONE TOUCH ULTRA TEST test strip use as directed   rosuvastatin (CRESTOR) 20 MG tablet Take 1 tablet (20 mg total) by mouth daily.   spironolactone (ALDACTONE) 25 MG tablet TAKE 1/2 TABLET(12.5 MG) BY MOUTH DAILY   No facility-administered encounter medications on file as of 07/14/2023.    Allergies (verified) Hydrochlorothiazide and Metformin and related   History: Past Medical History:  Diagnosis Date   Diabetes  mellitus    Hyperlipidemia    Hypertension    OSA on CPAP 11/10/2022   Prostate cancer (HCC) 12/12/2005   Dr Laverle Patter   Snoring 11/10/2022   Past Surgical History:  Procedure Laterality Date   COLONOSCOPY  2004 & 2014   Negative ;[facility]   INGUINAL HERNIA REPAIR     PROSTATECTOMY  04/2006   Dr Laverle Patter   UMBILICAL HERNIA REPAIR     Family History  Problem Relation Age of Onset   Hypertension Mother    Alzheimer's disease Mother    Heart attack Mother 64   Diabetes Father    Hypertension Father    Alzheimer's disease Father    Heart attack Sister 47   Heart attack Brother    Diabetes Brother    Heart disease Brother    Heart attack Maternal Aunt        in 68s   Stroke Neg Hx    Social History   Socioeconomic History   Marital status: Married    Spouse name: Vertie   Number of children: 1   Years of education: Not on file   Highest education level: Not on file  Occupational History   Occupation: Part Scientist, research (medical)  Tobacco Use   Smoking status: Never   Smokeless tobacco: Never  Vaping Use   Vaping status: Never Used  Substance and Sexual Activity   Alcohol use: No   Drug use: No   Sexual activity: Not on file  Other Topics Concern   Not on file  Social History Narrative   Not on file   Social Determinants of Health   Financial Resource Strain: Low Risk  (07/14/2023)   Overall Financial Resource Strain (CARDIA)    Difficulty of Paying Living Expenses: Not hard at all  Food Insecurity: No Food Insecurity (07/14/2023)   Hunger Vital Sign    Worried About Running Out of Food in the Last Year: Never true    Ran Out of Food in the Last Year: Never true  Transportation Needs: No Transportation Needs (07/14/2023)   PRAPARE - Administrator, Civil Service (Medical): No    Lack of Transportation (Non-Medical): No  Physical Activity: Sufficiently Active (07/14/2023)   Exercise Vital Sign    Days of Exercise per Week: 5 days    Minutes of Exercise per  Session: 30 min  Stress: No Stress Concern Present (07/14/2023)   [employer] of Occupational Health - Occupational Stress Questionnaire    Feeling of Stress : Not at all  Social Connections: Moderately Isolated (07/14/2023)   Social Connection and Isolation Panel [NHANES]    Frequency of Communication with Friends and Family: Once a week    Frequency of Social Gatherings with Friends and Family: Once a week    Attends Religious Services: 1 to 4 times  per year    Active Member of Clubs or Organizations: No    Attends Banker Meetings: Never    Marital Status: Married    Tobacco Counseling Counseling given: Not Answered   Clinical Intake:  Pre-visit preparation completed: Yes  Pain : No/denies pain     BMI - recorded: 27.31 Nutritional Status: BMI of 19-24  Normal Diabetes: No  How often do you need to have someone help you when you read instructions, pamphlets, or other written materials from your doctor or pharmacy?: 1 - Never     Information entered by ::  , RMA   Activities of Daily Living    07/14/2023    3:17 PM 07/20/2022    4:11 PM  In your present state of health, do you have any difficulty performing the following activities:  Hearing? 0 0  Vision? 0 0  Difficulty concentrating or making decisions? 0 0  Walking or climbing stairs? 0 0  Dressing or bathing? 0 0  Doing errands, shopping? 1 0  Preparing Food and eating ? N N  Using the Toilet? N N  In the past six months, have you accidently leaked urine? N N  Do you have problems with loss of bowel control? N N  Managing your Medications? N N  Managing your Finances? N N  Housekeeping or managing your Housekeeping? N N    Patient Care Team: Pincus Sanes, MD as PCP - General (Internal Medicine) Kathyrn Sheriff, [facility] (Inactive) as Pharmacist (Pharmacist)  Indicate any recent Medical Services you may have received from other than [facility] providers in the past year (date may  be approximate).     Assessment:   This is a routine wellness examination for [place].  Hearing/Vision screen Hearing Screening - Comments:: Denies hearing difficulties    Dietary issues and exercise activities discussed:     Goals Addressed             This Visit's Progress    maintain current health status   On track    Continue to eat healthy and exercise, enjoy life and family.      Depression Screen    07/14/2023    3:24 PM 11/08/2022   10:03 AM 07/20/2022    4:07 PM 10/09/2020    8:29 AM 10/04/2019    8:06 AM 09/19/2018    8:14 AM 09/15/2017    3:21 PM  PHQ 2/9 Scores  PHQ - 2 Score 0 0 0 0 0 0 0  PHQ- 9 Score 1 0  0  0 0    Fall Risk    07/14/2023    3:22 PM 11/08/2022   10:03 AM 07/20/2022    4:11 PM 11/26/2021    1:05 PM 10/16/2020    3:49 PM  Fall Risk   Falls in the past year? 0 0 0 0 0  Number falls in past yr: 0 0 0 0 0  Injury with Fall? 0 0 0 0 0  Risk for fall due to : No Fall Risks No Fall Risks No Fall Risks No Fall Risks No Fall Risks  Follow up Falls prevention discussed Falls evaluation completed Falls evaluation completed Falls evaluation completed Falls evaluation completed;Education provided    MEDICARE RISK AT HOME:  Medicare Risk at Home - 07/14/23 1520     Any stairs in or around the home? Yes    If so, are there any without handrails? Yes    Home free of loose  throw rugs in walkways, pet beds, electrical cords, etc? Yes    Adequate lighting in your home to reduce risk of falls? Yes    Life alert? No    Use of a cane, walker or w/c? No    Grab bars in the bathroom? No    Shower chair or bench in shower? Yes    Elevated toilet seat or a handicapped toilet? Yes             TIMED UP AND GO:  Was the test performed?  No    Cognitive Function:        07/20/2022    4:14 PM  6CIT Screen  What Year? 0 points  What month? 0 points  What time? 0 points  Count back from 20 0 points  Months in reverse 0 points  Repeat phrase 0  points  Total Score 0 points    Immunizations Immunization History  Administered Date(s) Administered   Fluad Quad(high Dose 65+) 09/06/2019, 09/26/2020, 08/27/2021, 09/16/2022   Influenza Split 10/12/2011, 08/24/2012   Influenza Whole 09/25/2007, 09/30/2008, 09/09/2009, 09/07/2010   Influenza, High Dose Seasonal PF 09/03/2014, 08/18/2016, 09/15/2017, 09/19/2018   Influenza,inj,Quad PF,6+ Mos 09/17/2013, 09/07/2015   PFIZER Comirnaty(Gray Top)Covid-19 Tri-Sucrose Vaccine 09/23/2022   PFIZER(Purple Top)SARS-COV-2 Vaccination 03/13/2020, 04/06/2020, 09/22/2020   Pfizer Covid-19 Vaccine Bivalent Booster 44yrs & up 09/17/2021   Pneumococcal Conjugate-13 10/28/2014   Pneumococcal Polysaccharide-23 11/12/2012   Td 04/21/2010   Tdap 05/16/2023   Zoster Recombinant(Shingrix) 06/09/2017, 08/15/2017   Zoster, Live 11/23/2012    TDAP status: Up to date  Flu Vaccine status: Up to date  Pneumococcal vaccine status: Up to date  Covid-19 vaccine status: Completed vaccines  Qualifies for Shingles Vaccine? Yes   Zostavax completed Yes   Shingrix Completed?: Yes  Screening Tests Health Maintenance  Topic Date Due   COVID-19 Vaccine (6 - 2023-24 season) 11/18/2022   INFLUENZA VACCINE  10/11/2023 (Originally 07/13/2023)   OPHTHALMOLOGY EXAM  07/15/2023   HEMOGLOBIN A1C  11/11/2023   FOOT EXAM  02/24/2024   Diabetic kidney evaluation - Urine ACR  05/11/2024   Diabetic kidney evaluation - eGFR measurement  05/15/2024   Medicare Annual Wellness (AWV)  07/13/2024   DTaP/Tdap/Td (3 - Td or Tdap) 05/15/2033   Pneumonia Vaccine 98+ Years old  Completed   Hepatitis C Screening  Completed   Zoster Vaccines- Shingrix  Completed   HPV VACCINES  Aged Out   Colonoscopy  Discontinued    Health Maintenance  Health Maintenance Due  Topic Date Due   COVID-19 Vaccine (6 - 2023-24 season) 11/18/2022    Colorectal cancer screening: Referral to GI placed 07/14/2023. Pt aware the office will call re:  appt.  Lung Cancer Screening: (Low Dose CT Chest recommended if Age 70-80 years, 20 pack-year currently smoking OR have quit w/in 15years.) does not qualify.   Lung Cancer Screening Referral: N/A  Additional Screening:  Hepatitis C Screening: does qualify; Completed 02/17/2016  Vision Screening: Recommended annual ophthalmology exams for early detection of glaucoma and other disorders of the eye. Is the patient up to date with their annual eye exam?  Yes  Who is the provider or what is the name of the office in which the patient attends annual eye exams? Dr. Randon Goldsmith If pt is not established with a provider, would they like to be referred to a provider to establish care? No .   Dental Screening: Recommended annual dental exams for proper oral hygiene  Diabetic Foot Exam: Diabetic  Foot Exam: Completed 02/24/2023  Community Resource Referral / Chronic Care Management: CRR required this visit?  No   CCM required this visit?  No     Plan:     I have personally reviewed and noted the following in the patient's chart:   Medical and social history Use of alcohol, tobacco or illicit drugs  Current medications and supplements including opioid prescriptions. Patient is not currently taking opioid prescriptions. Functional ability and status Nutritional status Physical activity Advanced directives List of other physicians Hospitalizations, surgeries, and ER visits in previous 12 months Vitals Screenings to include cognitive, depression, and falls Referrals and appointments  In addition, I have reviewed and discussed with patient certain preventive protocols, quality metrics, and best practice recommendations. A written personalized care plan for preventive services as well as general preventive health recommendations were provided to patient.      L , CMA   07/14/2023   After Visit Summary: (Mail) Due to this being a telephonic visit, the after visit summary with patients  personalized plan was offered to patient via mail   Nurse Notes: A referral has been placed for a colonoscopy (patient requested) today.  Patient had no other concerns to address today.  He will schedule his AWVs for next year during his office visit in December.

## 2023-07-18 DIAGNOSIS — G4733 Obstructive sleep apnea (adult) (pediatric): Secondary | ICD-10-CM | POA: Diagnosis not present

## 2023-08-02 ENCOUNTER — Ambulatory Visit (INDEPENDENT_AMBULATORY_CARE_PROVIDER_SITE_OTHER): Payer: Medicare Other | Admitting: Internal Medicine

## 2023-08-02 ENCOUNTER — Encounter: Payer: Self-pay | Admitting: Internal Medicine

## 2023-08-02 VITALS — BP 126/86 | HR 80 | Temp 97.3°F | Ht 73.0 in | Wt 209.0 lb

## 2023-08-02 DIAGNOSIS — J069 Acute upper respiratory infection, unspecified: Secondary | ICD-10-CM | POA: Insufficient documentation

## 2023-08-02 NOTE — Progress Notes (Signed)
Subjective:    Patient ID: Alfred Foster, male    DOB: [birth year], 77 y.o.   MRN: [id number]  HPI Here with wife due to respiratory illness  Felt he got sick after visit to restaurant for take out Last month Some dizziness--tried onion under bed and it may have helped some Still with slight cough--dry No fever, chills, aches No SOB No headache  Current Outpatient Medications on File Prior to Visit  Medication Sig Dispense Refill   amLODipine (NORVASC) 10 MG tablet Take 1 tablet (10 mg total) by mouth daily. 90 tablet 3   apixaban (ELIQUIS) 2.5 MG TABS tablet Take 1 tablet (2.5 mg total) by mouth 2 (two) times daily. 120 tablet 2   Ascorbic Acid (VITAMIN C PO) Take 1 tablet by mouth daily.     aspirin 81 MG tablet Take 81 mg by mouth daily.     Cholecalciferol (VITAMIN D3) 1000 UNITS CAPS Take 1,000 Units by mouth daily.     ciclopirox (PENLAC) 8 % solution Apply topically at bedtime. Apply over nail and surrounding skin. Apply daily over previous coat. After seven (7) days, may remove with alcohol and continue cycle. 6.6 mL 2   dapagliflozin propanediol (FARXIGA) 10 MG TABS tablet Take 1 tablet (10 mg total) by mouth daily before breakfast. 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  Dx: W09.811 2 each 0   folic acid (FOLVITE) 800 MCG tablet Take 800 mcg by mouth daily.     gabapentin (NEURONTIN) 100 MG capsule Take 1 capsule (100 mg total) by mouth 2 (two) times daily. 60 capsule 5   glimepiride (AMARYL) 1 MG tablet TAKE 1 TABLET(1 MG) BY MOUTH DAILY WITH BREAKFAST 30 tablet 5   hydrALAZINE (APRESOLINE) 100 MG tablet Take 1 tablet (100 mg total) by mouth 3 (three) times daily. 270 tablet 3   Ipratropium-Albuterol (COMBIVENT) 20-100 MCG/ACT AERS respimat Inhale 1 puff into the lungs every 6 (six) hours as needed for wheezing or shortness of breath. 4 g 0   Lancets (ONETOUCH ULTRASOFT) lancets use as directed 100  each 1   losartan (COZAAR) 100 MG tablet Take 1 tablet (100 mg total) by mouth daily. 90 tablet 3   metoprolol (TOPROL XL) 200 MG 24 hr tablet Take 1 tablet (200 mg total) by mouth daily. 90 tablet 3   Omega-3 Fatty Acids (FISH OIL PO) Take 1,000 mg by mouth daily.     ONE TOUCH ULTRA TEST test strip use as directed 100 each 1   rosuvastatin (CRESTOR) 20 MG tablet Take 1 tablet (20 mg total) by mouth daily. 90 tablet 3   spironolactone (ALDACTONE) 25 MG tablet TAKE 1/2 TABLET(12.5 MG) BY MOUTH DAILY 30 tablet 5   No current facility-administered medications on file prior to visit.    Allergies  Allergen Reactions   Hydrochlorothiazide     SOB, dizzy   Metformin And Related Other (See Comments)    Make him feel full and uncomfortable    Past Medical History:  Diagnosis Date   Diabetes mellitus    Hyperlipidemia    Hypertension    OSA on CPAP 11/10/2022   Prostate cancer (HCC) 12/12/2005   Dr Laverle Patter   Snoring 11/10/2022    Past Surgical History:  Procedure Laterality Date   COLONOSCOPY  2004 & 2014   Negative ;[facility]   EYE SURGERY  09/06/2022   Cataract surgery   INGUINAL  HERNIA REPAIR     PROSTATECTOMY  04/11/2006   Dr Laverle Patter   UMBILICAL HERNIA REPAIR      Family History  Problem Relation Age of Onset   Hypertension Mother    Alzheimer's disease Mother    Heart attack Mother 68   Diabetes Father    Hypertension Father    Alzheimer's disease Father    Heart attack Sister 63   Heart attack Brother    Diabetes Brother    Heart disease Brother    Heart attack Maternal Aunt        in 30s   Stroke Neg Hx     Social History   Socioeconomic History   Marital status: Married    Spouse name: Vertie   Number of children: 1   Years of education: Not on file   Highest education level: Not on file  Occupational History   Occupation: Part Scientist, research (medical)  Tobacco Use   Smoking status: Never   Smokeless tobacco: Never  Vaping Use   Vaping status: Never Used   Substance and Sexual Activity   Alcohol use: No   Drug use: No   Sexual activity: Not on file  Other Topics Concern   Not on file  Social History Narrative   Not on file   Social Determinants of Health   Financial Resource Strain: Low Risk  (07/14/2023)   Overall Financial Resource Strain (CARDIA)    Difficulty of Paying Living Expenses: Not hard at all  Food Insecurity: No Food Insecurity (07/14/2023)   Hunger Vital Sign    Worried About Running Out of Food in the Last Year: Never true    Ran Out of Food in the Last Year: Never true  Transportation Needs: No Transportation Needs (07/14/2023)   PRAPARE - Administrator, Civil Service (Medical): No    Lack of Transportation (Non-Medical): No  Physical Activity: Sufficiently Active (07/14/2023)   Exercise Vital Sign    Days of Exercise per Week: 5 days    Minutes of Exercise per Session: 30 min  Stress: No Stress Concern Present (07/14/2023)   [employer] of Occupational Health - Occupational Stress Questionnaire    Feeling of Stress : Not at all  Social Connections: Moderately Isolated (07/14/2023)   Social Connection and Isolation Panel [NHANES]    Frequency of Communication with Friends and Family: Once a week    Frequency of Social Gatherings with Friends and Family: Once a week    Attends Religious Services: 1 to 4 times per year    Active Member of [employer] or Organizations: No    Attends Banker Meetings: Never    Marital Status: Married  Catering manager Violence: Not At Risk (07/14/2023)   Humiliation, Afraid, Rape, and Kick questionnaire    Fear of Current or Ex-Partner: No    Emotionally Abused: No    Physically Abused: No    Sexually Abused: No   Review of Systems No loss of sense of smell No N/V Eating okay    Objective:   Physical Exam Constitutional:      Appearance: Normal appearance.  HENT:     Head:     Comments: No sinus tenderness    Right Ear: Tympanic membrane and ear canal  normal.     Left Ear: Tympanic membrane and ear canal normal.     Mouth/Throat:     Comments: Mild pharyngeal injection without tonsillar enlargement or exudates Pulmonary:     Effort: Pulmonary  effort is normal.     Breath sounds: Normal breath sounds. No wheezing or rales.  Musculoskeletal:     Cervical back: Neck supple.  Lymphadenopathy:     Cervical: No cervical adenopathy.  Neurological:     Mental Status: He is alert.            Assessment & Plan:

## 2023-08-02 NOTE — Assessment & Plan Note (Signed)
Likely had COVID with mild residual symptoms No evidence of secondary bacterial infection Mostly feels better Can use analgesics Is back to work If worsens, would consider empiric Rx with amoxil/augmentin

## 2023-08-17 ENCOUNTER — Telehealth: Payer: Self-pay | Admitting: Internal Medicine

## 2023-08-17 DIAGNOSIS — N2581 Secondary hyperparathyroidism of renal origin: Secondary | ICD-10-CM | POA: Diagnosis not present

## 2023-08-17 DIAGNOSIS — I701 Atherosclerosis of renal artery: Secondary | ICD-10-CM | POA: Diagnosis not present

## 2023-08-17 DIAGNOSIS — Z86718 Personal history of other venous thrombosis and embolism: Secondary | ICD-10-CM | POA: Diagnosis not present

## 2023-08-17 DIAGNOSIS — N1832 Chronic kidney disease, stage 3b: Secondary | ICD-10-CM | POA: Diagnosis not present

## 2023-08-17 DIAGNOSIS — E1122 Type 2 diabetes mellitus with diabetic chronic kidney disease: Secondary | ICD-10-CM | POA: Diagnosis not present

## 2023-08-17 NOTE — Telephone Encounter (Signed)
Patient would like to know if PCP thinks they should get a Covid shot. His wife would like a call back at [phone].

## 2023-08-17 NOTE — Telephone Encounter (Signed)
Spoke with spouse today.

## 2023-08-18 ENCOUNTER — Telehealth: Payer: Self-pay | Admitting: Internal Medicine

## 2023-08-18 NOTE — Telephone Encounter (Signed)
Patient called and wants to know if we have any samples of  Farxiga 10 mg and Elliquis 2.5.  Please call patient and let them know.  Patient's phone:  [phone]

## 2023-08-21 ENCOUNTER — Other Ambulatory Visit: Payer: Self-pay

## 2023-08-21 MED ORDER — APIXABAN 2.5 MG PO TABS: 2.5000 mg | ORAL_TABLET | Freq: Two times a day (BID) | ORAL | Status: DC

## 2023-08-21 MED ORDER — DAPAGLIFLOZIN PROPANEDIOL 10 MG PO TABS: 10.0000 mg | ORAL_TABLET | Freq: Every day | ORAL | Status: AC

## 2023-08-21 NOTE — Telephone Encounter (Addendum)
Spoke with the patient's wife and advised her that we do have the samples and they are available for pickup at the front desk. She gave a verbal understanding.   Farxiga 10mg  Quantity 4 boxes  Lot number: [id number] EXP: 02/08/2026  Eliquis 2.5mg  Quantity 4 boxes  Lot Number: [id number] EXP: April 2026

## 2023-08-21 NOTE — Telephone Encounter (Signed)
Patient's daughter called back to check on the status of their request. Best callback is [phone].

## 2023-09-01 ENCOUNTER — Ambulatory Visit: Payer: Medicare Other | Admitting: Podiatrist

## 2023-09-01 ENCOUNTER — Ambulatory Visit: Payer: Medicare Other | Admitting: Podiatry

## 2023-09-12 ENCOUNTER — Other Ambulatory Visit: Payer: Self-pay | Admitting: Internal Medicine

## 2023-09-15 ENCOUNTER — Ambulatory Visit: Payer: Medicare Other

## 2023-09-18 ENCOUNTER — Ambulatory Visit: Payer: Medicare Other

## 2023-09-21 ENCOUNTER — Ambulatory Visit: Payer: Medicare Other | Admitting: Radiology

## 2023-09-21 DIAGNOSIS — Z23 Encounter for immunization: Secondary | ICD-10-CM | POA: Diagnosis not present

## 2023-09-21 NOTE — Progress Notes (Signed)
Patient here for HD flu shot patient tolerated well with no complicaitons

## 2023-10-01 NOTE — Progress Notes (Unsigned)
10/01/2023 VIRGIE DAMER [id number] Oct 31, 1946  CHIEF COMPLAINT: Discuss scheduling a colonoscopy   HISTORY OF PRESENT ILLNESS: Max L. Vandewater is a 77 year old male with a past medical history of hypertension, carotid artery stenosis, RLE DVT 5 to 10 years ago on Eliquis, DM type II, CKD stage III, OSA uses CPAP and prostate cancer 2007 s/p prostatectomy. He presents today as referred by Dr. Cheryll Cockayne to discuss scheduling a colonoscopy. His most recent colonoscopy was 06/2013 which was normal, no polyps. Colonoscopy 12/2002 was normal.  He denies having any abdominal pain.  He passes a normal brown formed bowel movement daily.  No rectal bleeding or black stools.  No known family history of colon polyps or colorectal cancer.  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  He denies having any chest pain.  He infrequently has mild shortness of breath when he assist his obese wife in and out of the wheelchair.  No cough.  No GERD symptoms or dysphagia.     Latest Ref Rng & Units 05/12/2023    8:50 AM 11/08/2022   10:53 AM 07/08/2022    2:51 PM  CBC  WBC 4.0 - 10.5 K/uL 5.9  6.7  7.7   Hemoglobin 13.0 - 17.0 g/dL 30.8  65.7  84.6   Hematocrit 39.0 - 52.0 % 41.8  42.8  43.3   Platelets 150.0 - 400.0 K/uL 258.0  267.0  223.0        Latest Ref Rng & Units 05/16/2023    2:42 PM 05/12/2023    8:50 AM 11/08/2022   10:53 AM  CMP  Glucose 70 - 99 mg/dL 94  962  952   BUN 6 - 23 mg/dL 22  31  21    Creatinine 0.40 - 1.50 mg/dL 8.41  3.24  4.01   Sodium 135 - 145 mEq/L 143  138  143   Potassium 3.5 - 5.1 mEq/L 4.6  4.9  4.3   Chloride 96 - 112 mEq/L 113  108  111   CO2 19 - 32 mEq/L 23  23  24    Calcium 8.4 - 10.5 mg/dL 9.4  9.5  9.4   Total Protein 6.0 - 8.3 g/dL  6.9  7.4   Total Bilirubin 0.2 - 1.2 mg/dL  0.7  0.4   Alkaline Phos 39 - 117 U/L  81  88   AST 0 - 37 U/L  19  20   ALT 0 - 53 U/L  19  18      ECHO 12/08/2022:  1. Left ventricular ejection  fraction, by estimation, is 65 to 70%. Left  ventricular ejection fraction by PLAX is 67 %. The left ventricle has  normal function. The left ventricle has no regional wall motion  abnormalities. Left ventricular diastolic  parameters were normal.   2. Right ventricular systolic function is normal. The right ventricular  size is normal. Tricuspid regurgitation signal is inadequate for assessing  PA pressure.   3. The mitral valve is abnormal. Trivial mitral valve regurgitation.   4. The aortic valve is tricuspid. Aortic valve regurgitation is not  visualized.   5. The inferior vena cava is normal in size with greater than 50%  respiratory variability, suggesting right atrial pressure of 3 mmHg.   Bilateral Carotid Dopplers  12/17/2021: Summary:  Right Carotid: Velocities in the right ICA are consistent with a 1-39%  stenosis.   Left Carotid: Velocities in the  left ICA are consistent with a 1-39%  stenosis.  Non-hemodynamically significant plaque <50% noted in the  CCA.   PAST GI PROCEDURES:   Colonoscopy 06/24/2013 by Dr. Jarold Motto: Normal Colonoscopy Recall colonoscopy 10 years  Colonoscopy 12/23/2002: Normal Colonoscopy   Past Medical History:  Diagnosis Date   Diabetes mellitus    Hyperlipidemia    Hypertension    OSA on CPAP 11/10/2022   Prostate cancer (HCC) 12/12/2005   Dr Laverle Patter   Snoring 11/10/2022   Past Surgical History:  Procedure Laterality Date   COLONOSCOPY  2004 & 2014   Negative ; GI   EYE SURGERY  09/06/2022   Cataract surgery   INGUINAL HERNIA REPAIR     PROSTATECTOMY  04/11/2006   Dr Laverle Patter   UMBILICAL HERNIA REPAIR     Social History: He is married.  He has 1 son.  Non-smoker.  No alcohol use.  No drug use.  Family History: family history includes Alzheimer's disease in his father and mother; Diabetes in his brother and father; Heart attack in his brother and maternal aunt; Heart attack (age of onset: 72) in his sister; Heart attack (age  of onset: 49) in his mother; Heart disease in his brother; Hypertension in his father and mother.  No known family history of esophageal, gastric or colon cancer.   Allergies  Allergen Reactions   Hydrochlorothiazide     SOB, dizzy   Metformin And Related Other (See Comments)    Make him feel full and uncomfortable      Outpatient Encounter Medications as of 10/02/2023  Medication Sig   amLODipine (NORVASC) 10 MG tablet Take 1 tablet (10 mg total) by mouth daily.   apixaban (ELIQUIS) 2.5 MG TABS tablet Take 1 tablet (2.5 mg total) by mouth 2 (two) times daily.   apixaban (ELIQUIS) 2.5 MG TABS tablet Take 1 tablet (2.5 mg total) by mouth 2 (two) times daily.   Ascorbic Acid (VITAMIN C PO) Take 1 tablet by mouth daily.   aspirin 81 MG tablet Take 81 mg by mouth daily.   Cholecalciferol (VITAMIN D3) 1000 UNITS CAPS Take 1,000 Units by mouth daily.   ciclopirox (PENLAC) 8 % solution Apply topically at bedtime. Apply over nail and surrounding skin. Apply daily over previous coat. After seven (7) days, may remove with alcohol and continue cycle.   dapagliflozin propanediol (FARXIGA) 10 MG TABS tablet Take 1 tablet (10 mg total) by mouth daily before breakfast.   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  Dx: W09.811   folic acid (FOLVITE) 800 MCG tablet Take 800 mcg by mouth daily.   gabapentin (NEURONTIN) 100 MG capsule Take 1 capsule (100 mg total) by mouth 2 (two) times daily.   glimepiride (AMARYL) 1 MG tablet TAKE 1 TABLET(1 MG) BY MOUTH DAILY WITH BREAKFAST   hydrALAZINE (APRESOLINE) 100 MG tablet Take 1 tablet (100 mg total) by mouth 3 (three) times daily.   Ipratropium-Albuterol (COMBIVENT) 20-100 MCG/ACT AERS respimat Inhale 1 puff into the lungs every 6 (six) hours as needed for wheezing or shortness of breath.   Lancets (ONETOUCH ULTRASOFT) lancets use as directed   losartan (COZAAR) 100 MG tablet TAKE 1 TABLET(100 MG) BY MOUTH  DAILY   metoprolol (TOPROL XL) 200 MG 24 hr tablet Take 1 tablet (200 mg total) by mouth daily.   Omega-3 Fatty Acids (FISH OIL PO) Take 1,000 mg by mouth daily.   ONE TOUCH ULTRA TEST test strip  use as directed   rosuvastatin (CRESTOR) 20 MG tablet Take 1 tablet (20 mg total) by mouth daily.   spironolactone (ALDACTONE) 25 MG tablet TAKE 1/2 TABLET(12.5 MG) BY MOUTH DAILY   No facility-administered encounter medications on file as of 10/02/2023.   REVIEW OF SYSTEMS:  Gen: Denies fever, sweats or chills. No weight loss.  CV: Denies chest pain, palpitations or edema. Resp: + SOB. Denies cough or hemoptysis.  GI: See HPI. GU: Denies urinary burning, blood in urine, increased urinary frequency or incontinence. MS: + back pain and sciatica  Derm: Denies rash, itchiness, skin lesions or unhealing ulcers. Psych: Denies depression, anxiety, memory loss or confusion. Heme: Denies bruising, easy bleeding. Neuro:  Denies headaches, dizziness or paresthesias. Endo: + DM type II.  PHYSICAL EXAM: BP 124/78   Pulse 82   Ht 6\' 1"  (1.854 m)   Wt 206 lb 3.2 oz (93.5 kg)   BMI 27.20 kg/m   General: 77 year old male in no acute distress. Head: Normocephalic and atraumatic. Eyes:  Sclerae non-icteric, conjunctive pink. Ears: Normal auditory acuity. Mouth: Dentition intact. No ulcers or lesions.  Neck: Supple, no lymphadenopathy or thyromegaly.  Lungs: Clear bilaterally to auscultation without wheezes, crackles or rhonchi. Heart: Regular rate and rhythm. No murmur, rub or gallop appreciated.  Abdomen: Soft, nontender, nondistended. LUQ lipoma (patient stated had lipoma for years, monitored by PCP), no hepatosplenomegaly. Normoactive bowel sounds x 4 quadrants.  No bruit. Rectal: Deferred. Musculoskeletal: Symmetrical with no gross deformities. Skin: Warm and dry. No rash or lesions on visible extremities. Extremities: No edema. Neurological: Alert oriented x 4, no focal deficits.   Psychological:  Alert and cooperative. Normal mood and affect.  ASSESSMENT AND PLAN:  77 year old male presents today to discuss scheduling a screening colonoscopy.  Normal colonoscopy in 2004 in 2014.  No known family history of colon polyps or colorectal cancer. -Colonoscopy benefits and risks discussed including risk with sedation, risk of bleeding, perforation and infection.  Due to the patient's age and multiple comorbidities including carotid artery stenosis, hypertension, DM TII, sleep apnea, CKD, DVT on Eliquis it is unclear if the benefits outweigh the risks of a colonoscopy at this juncture therefore I will consult with Dr. Lavon Paganini to review case and to verify if a screening colonoscopy to be scheduled or deferred. -Our office will contact PCP to verify Eliquis hold instructions if colonoscopy pursued  ADDENDUM: Dr. Lavon Paganini verified given he is average risk, we can hold off proceeding with screening colonoscopy beyond age 8. Agree his benefits do not outweigh risk given his age and comorbid conditions. I called the patient and his wife answered his phone as he was checking in at another doctor's appointment at this time and I informed Mrs. Cedillo regarding Dr. Elana Alm recommendations as above. Patient instructed to contact our office if he develops any GI symptoms or rectal bleeding.   History of prostate cancer  History of RLE DVT on Eliquis   DM type II  Carotid artery stenosis   CKD  Sleep apnea uses Cpap        CC:  Pincus Sanes, MD

## 2023-10-02 ENCOUNTER — Ambulatory Visit: Payer: Medicare Other | Admitting: Nurse Practitioner

## 2023-10-02 ENCOUNTER — Ambulatory Visit: Payer: Medicare Other | Admitting: Podiatry

## 2023-10-02 ENCOUNTER — Encounter: Payer: Self-pay | Admitting: Nurse Practitioner

## 2023-10-02 VITALS — BP 124/78 | HR 82 | Ht 73.0 in | Wt 206.2 lb

## 2023-10-02 DIAGNOSIS — Z1211 Encounter for screening for malignant neoplasm of colon: Secondary | ICD-10-CM | POA: Diagnosis not present

## 2023-10-02 DIAGNOSIS — G4730 Sleep apnea, unspecified: Secondary | ICD-10-CM

## 2023-10-02 DIAGNOSIS — E119 Type 2 diabetes mellitus without complications: Secondary | ICD-10-CM

## 2023-10-02 DIAGNOSIS — M79675 Pain in left toe(s): Secondary | ICD-10-CM | POA: Diagnosis not present

## 2023-10-02 DIAGNOSIS — B351 Tinea unguium: Secondary | ICD-10-CM | POA: Diagnosis not present

## 2023-10-02 DIAGNOSIS — I6529 Occlusion and stenosis of unspecified carotid artery: Secondary | ICD-10-CM

## 2023-10-02 DIAGNOSIS — M79674 Pain in right toe(s): Secondary | ICD-10-CM | POA: Diagnosis not present

## 2023-10-02 DIAGNOSIS — Z8546 Personal history of malignant neoplasm of prostate: Secondary | ICD-10-CM

## 2023-10-02 DIAGNOSIS — D689 Coagulation defect, unspecified: Secondary | ICD-10-CM

## 2023-10-02 DIAGNOSIS — N189 Chronic kidney disease, unspecified: Secondary | ICD-10-CM | POA: Diagnosis not present

## 2023-10-02 NOTE — Progress Notes (Signed)
Subjective:  77 y.o. returns the office today for painful, elongated, thickened toenails which he cannot trim himself. Denies any redness or drainage around the nails.  The nails grow out long and they cause discomfort.  No open lesions.  He is on Eliquis  PCP: Pincus Sanes, MD Last Seen: 08/02/2023 A1c: 6.2 on May 12, 2023  Objective: AAO 3, NAD DP pulse 2/4, PT 1/4 b/l, RT < 3 seconds Nails hypertrophic, dystrophic, elongated, brittle, discolored 10. There is tenderness overlying the nails 1-5 bilaterally. There is no surrounding erythema or drainage along the nail sites. Decreased range of motion of the first MPJ bilaterally right side worse than left.  No significant pain at this area. No pain with calf compression, swelling, warmth, erythema.  Assessment: Patient presents with symptomatic onychomycosis  Plan: -Treatment options including alternatives, risks, complications were discussed -Nails sharply debrided 10 without complication/bleeding. Penlac for the nail fungus.  -Daily foot inspection. -Follow-up in 3 months or sooner if any problems are to arise. In the meantime, encouraged to call the office with any questions, concerns, changes symptoms.  Ovid Curd, DPM

## 2023-10-02 NOTE — Patient Instructions (Addendum)
Our office will contact you regarding Dr. Elana Alm recommendations regarding future colonoscopies.  Thank you for entrusting me with your care and for choosing [facility], [place], CRNP    If your blood pressure at your visit was 140/90 or greater, please contact your primary care physician to follow up on this. ______________________________________________________  If you are age 76 or older, your body mass index should be between 23-30. Your Body mass index is 27.2 kg/m. If this is out of the aforementioned range listed, please consider follow up with your Primary Care Provider.  If you are age 36 or younger, your body mass index should be between 19-25. Your Body mass index is 27.2 kg/m. If this is out of the aformentioned range listed, please consider follow up with your Primary Care Provider.  ________________________________________________________  The [facility] providers would like to encourage you to use [facility] to communicate with providers for non-urgent requests or questions.  Due to long hold times on the telephone, sending your provider a message by [facility] may be a faster and more efficient way to get a response.  Please allow 48 business hours for a response.  Please remember that this is for non-urgent requests.  _______________________________________________________  Due to recent changes in healthcare laws, you may see the results of your imaging and laboratory studies on MyChart before your provider has had a chance to review them.  We understand that in some cases there may be results that are confusing or concerning to you. Not all laboratory results come back in the same time frame and the provider may be waiting for multiple results in order to interpret others.  Please give [place] 48 hours in order for your provider to thoroughly review all the results before contacting the office for clarification of your results.

## 2023-10-06 ENCOUNTER — Ambulatory Visit: Payer: Medicare Other | Admitting: Podiatry

## 2023-10-09 ENCOUNTER — Telehealth: Payer: Self-pay | Admitting: Internal Medicine

## 2023-10-09 NOTE — Telephone Encounter (Signed)
And also needing samples for dapagliflozin propanediol (FARXIGA) 10 MG TABS tablet

## 2023-10-09 NOTE — Telephone Encounter (Signed)
Patients wife called and wanted more samples of Eliquis 2.5

## 2023-10-10 NOTE — Telephone Encounter (Signed)
Patient has picked up medication 

## 2023-10-10 NOTE — Telephone Encounter (Signed)
Samples left up front for pick up.  

## 2023-10-12 ENCOUNTER — Ambulatory Visit: Payer: Medicare Other | Admitting: Internal Medicine

## 2023-10-12 ENCOUNTER — Encounter: Payer: Self-pay | Admitting: Internal Medicine

## 2023-10-12 VITALS — BP 126/80 | HR 82 | Temp 98.6°F | Ht 73.0 in | Wt 209.0 lb

## 2023-10-12 DIAGNOSIS — I10 Essential (primary) hypertension: Secondary | ICD-10-CM

## 2023-10-12 DIAGNOSIS — M5416 Radiculopathy, lumbar region: Secondary | ICD-10-CM | POA: Diagnosis not present

## 2023-10-12 MED ORDER — PREDNISONE 20 MG PO TABS
20.0000 mg | ORAL_TABLET | Freq: Every day | ORAL | 0 refills | Status: AC
Start: 2023-10-12 — End: 2023-10-15

## 2023-10-12 MED ORDER — GABAPENTIN 100 MG PO CAPS: 200.0000 mg | ORAL_CAPSULE | Freq: Every day | ORAL | 5 refills | Status: DC

## 2023-10-12 NOTE — Assessment & Plan Note (Signed)
Acute on chronic Acute flare of left lumbar radiculopathy Likely flare from lifting at work Advised to avoid bending, lifting and twisting No weakness, N/T Start taking gabapentin 200 mg HS ( increase in dose) Start prednisone 20 mg daily x 3 days -- has already improved and should not need long coarse He will let me know if symptoms do not resolve

## 2023-10-12 NOTE — Assessment & Plan Note (Signed)
Chronic Blood pressure controlled Continue amlodipine 10 mg daily, losartan 100 mg daily, metoprolol XL 200 mg daily, spironolactone 12.5 mg daily, hydralazine to 100 mg 3 times daily

## 2023-10-12 NOTE — Progress Notes (Signed)
Subjective:    Patient ID: Alfred Foster, male    DOB: 07-18-1946, 77 y.o.   MRN: [id number]      HPI Alfred Foster is here for  Chief Complaint  Patient presents with   Back Pain    Left sided back pain that runs down into his leg    His pain from left lateral hip to foot -  has been intermittent for a while.  Yesterday after lifting something at work the pain was severe. He has pain from his left lateral hip to left lateral ankle.  Occasionally the pain goes to his foot.   May have some intermittent muscle spasms.  No N/T.  No leg weakness.  No changes in bowel/bladder. It is a little better since yesterday.  No pain when laying on left side.    He is taking tylenol.  He currently takes gabapentin 100 mg at night.     Medications and allergies reviewed with patient and updated if appropriate.  Current Outpatient Medications on File Prior to Visit  Medication Sig Dispense Refill   amLODipine (NORVASC) 10 MG tablet Take 1 tablet (10 mg total) by mouth daily. 90 tablet 3   apixaban (ELIQUIS) 2.5 MG TABS tablet Take 1 tablet (2.5 mg total) by mouth 2 (two) times daily. 120 tablet 2   Ascorbic Acid (VITAMIN C PO) Take 1 tablet by mouth daily.     Cholecalciferol (VITAMIN D3) 1000 UNITS CAPS Take 1,000 Units by mouth daily.     dapagliflozin propanediol (FARXIGA) 10 MG TABS tablet Take 1 tablet (10 mg total) by mouth daily before breakfast. 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  Dx: W09.811 2 each 0   folic acid (FOLVITE) 800 MCG tablet Take 800 mcg by mouth daily.     gabapentin (NEURONTIN) 100 MG capsule Take 1 capsule (100 mg total) by mouth 2 (two) times daily. 60 capsule 5   glimepiride (AMARYL) 1 MG tablet TAKE 1 TABLET(1 MG) BY MOUTH DAILY WITH BREAKFAST 30 tablet 5   hydrALAZINE (APRESOLINE) 100 MG tablet Take 1 tablet (100 mg total) by mouth 3 (three) times daily. 270 tablet 3   Ipratropium-Albuterol  (COMBIVENT) 20-100 MCG/ACT AERS respimat Inhale 1 puff into the lungs every 6 (six) hours as needed for wheezing or shortness of breath. 4 g 0   Lancets (ONETOUCH ULTRASOFT) lancets use as directed 100 each 1   losartan (COZAAR) 100 MG tablet TAKE 1 TABLET(100 MG) BY MOUTH DAILY 90 tablet 3   metoprolol (TOPROL XL) 200 MG 24 hr tablet Take 1 tablet (200 mg total) by mouth daily. 90 tablet 3   Omega-3 Fatty Acids (FISH OIL PO) Take 1,000 mg by mouth daily.     ONE TOUCH ULTRA TEST test strip use as directed 100 each 1   rosuvastatin (CRESTOR) 20 MG tablet Take 1 tablet (20 mg total) by mouth daily. 90 tablet 3   spironolactone (ALDACTONE) 25 MG tablet TAKE 1/2 TABLET(12.5 MG) BY MOUTH DAILY 30 tablet 5   apixaban (ELIQUIS) 2.5 MG TABS tablet Take 1 tablet (2.5 mg total) by mouth 2 (two) times daily.     ciclopirox (PENLAC) 8 % solution Apply topically at bedtime. Apply over nail and surrounding skin. Apply daily over previous coat. After seven (7) days, may remove with alcohol and continue cycle. (Patient not taking: Reported on 10/12/2023) 6.6 mL 2   No current facility-administered  medications on file prior to visit.    Review of Systems     Objective:   Vitals:   10/12/23 0939  BP: 126/80  Pulse: 82  Temp: 98.6 F (37 C)  SpO2: 97%   BP Readings from Last 3 Encounters:  10/12/23 126/80  10/02/23 124/78  08/02/23 126/86   Wt Readings from Last 3 Encounters:  10/12/23 209 lb (94.8 kg)  10/02/23 206 lb 3.2 oz (93.5 kg)  08/02/23 209 lb (94.8 kg)   Body mass index is 27.57 kg/m.    Physical Exam Constitutional:      General: He is not in acute distress.    Appearance: Normal appearance. He is not ill-appearing.  HENT:     Head: Normocephalic and atraumatic.  Musculoskeletal:        General: No tenderness (no lumbar spine tenderness, no left lateral hip tenderness).     Right lower leg: Edema (trace -wearing compression socks) present.     Left lower leg: Edema (trace  -wearing compression socks) present.  Neurological:     General: No focal deficit present.     Mental Status: He is alert.     Sensory: No sensory deficit.     Motor: No weakness.     Gait: Gait normal.  Psychiatric:        Mood and Affect: Mood normal.            Assessment & Plan:    See Problem List for Assessment and Plan of chronic medical problems.

## 2023-10-12 NOTE — Patient Instructions (Addendum)
       Medications changes include :   increase gabapentin to 200 mg at night.  Prednisone 20 mg x 3 days   You can continue tylenol.      Return if symptoms worsen or fail to improve.

## 2023-10-18 ENCOUNTER — Other Ambulatory Visit: Payer: Self-pay | Admitting: Internal Medicine

## 2023-10-24 NOTE — Progress Notes (Unsigned)
[facility] Follow-up:    Date:  10/25/2023   ID:  Alfred Foster, DOB December 17, 1945, MRN [id number]  PCP:  Pincus Sanes, MD  Cardiologist:  None  Nephrologist:  Referring MD: Pincus Sanes, MD   CC: Hypertension  History of Present Illness:    Alfred Foster is a 77 y.o. male with a hx of carotid stenosis, DVT, CKD III, hypertension, hyperlipidemia, diabetes mellitus, prostate cancer, OSA on CPAP, here for follow-up. He was initially seen 11/10/2022 in the [facility]. He last saw Dr. Antoine Poche 12/2021 for shortness of breath. He had an ETT that showed frequent PVCs and up to 7 beats of NSVT. His blood pressure was 185/95 at rest, and increased to 213/106 with exertion. It was negative for ischemia. Carotid dopplers at that time revealed mild stenosis bilaterally. Metoprolol was increased. He saw Dr. Lawerance Bach 11/08/2022, and his blood pressure was 150/90. His home blood pressures were mostly elevated but fluctuated. Hydralazine was increased, and he remained on amlodipine, losartan, metoprolol, and spironolactone.  At his initial appointment, he endorsed labile blood pressures despite antihypertensive compliance. He was monitoring at home with a wrist cuff. He complained of sciatic nerve pain mostly radiating distally to his left foot. He had occasional dizziness and shortness of breath when bending over, more frequent with his daily work. However he noted overall improvement in his symptoms since his stress test in 12/2021. His hydralazine had recent been increased, and his blood pressure was well controlled in the office. As he wasn't sure if his machine was accurate given labile readings, he wore a 24 hr blood pressure monitor 12/2022 showing overall average blood pressure 154/80 with overall average HR of 79 bpm. Blood pressures were uncontrolled during all periods. He was instructed to increase his hydralazine to 100 mg TID. Renal artery dopplers 11/2022 showed  1-59% stenosis of the left renal artery, no evidence of stenosis in the right. Renin/aldosterone, and TSH levels were normal. Echo 11/2022 revealed LVEF 65-70%, normal diastolic parameters, no significant valvular abnormalities. He also endorsed snoring and his wife had previously noted disordered breathing. He had a sleep study 12/16/2022 that showed severe OSA and he was recommended for CPAP. In 12/2022 he followed up with Gillian Shields, NP and his blood pressure was 119/62. It was noted that he had obtained a new BP cuff and was not yet using it routinely. He denied recurrent shortness of breath.   At his appointment 04/2023 his wife noted that he had exertional dyspnea. Given that his recent stress and echo were unremarkable, no further testing occurred.  He was encouraged to use his CPAP more consistently.  Mr. Shelor presents for a routine follow-up. He reports a recent episode of severe muscle strain or pull on the job, which required him to take time off work. The incident was managed by his primary care physician with prednisone and an increase in another unspecified medication, leading to significant improvement.  The patient denies any recent issues with memory, attributing occasional forgetfulness to age. He maintains an active lifestyle, engaging in daily exercises including push-ups, arm movements, and toe movements. He denies any discomfort, chest pain, or breathing difficulties during these activities.  The patient also reports no recent issues with swelling in his legs, attributing this to the regular use of compression socks. He has been managing his household chores and taking care of his spouse, indicating a high level of daily activity.  The patient's blood pressure has been stable  and well-controlled, with occasional readings in the 140s. He is currently on a regimen of amlodipine, hydralazine, losartan, metoprolol, and rosuvastatin. He also takes Eliquis 2.5mg  twice daily due to a  history of blood clots.  The patient recently visited his urologist, who was pleased with his progress and did not require any further follow-ups. The patient's cholesterol levels were also reported to be well-controlled, with a total cholesterol of 121 and LDL of 70. He had mild blockage in his neck last year, which has remained stable.       Prior Antihypertensives: HCTZ - shortness of breath, dizziness  Past Medical History:  Diagnosis Date   Diabetes mellitus    Hyperlipidemia    Hypertension    OSA on CPAP 11/10/2022   Prostate cancer (HCC) 12/12/2005   Dr Laverle Patter   Snoring 11/10/2022    Past Surgical History:  Procedure Laterality Date   COLONOSCOPY  2004 & 2014   Negative ;[facility]   EYE SURGERY  09/06/2022   Cataract surgery   INGUINAL HERNIA REPAIR     PROSTATECTOMY  04/11/2006   Dr Laverle Patter   UMBILICAL HERNIA REPAIR      Current Medications: Current Meds  Medication Sig   amLODipine (NORVASC) 10 MG tablet Take 1 tablet (10 mg total) by mouth daily.   Ascorbic Acid (VITAMIN C PO) Take 1 tablet by mouth daily.   Cholecalciferol (VITAMIN D3) 1000 UNITS CAPS Take 1,000 Units by mouth daily.   dapagliflozin propanediol (FARXIGA) 10 MG TABS tablet Take 1 tablet (10 mg total) by mouth daily before breakfast.   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  Dx: W09.811   folic acid (FOLVITE) 800 MCG tablet Take 800 mcg by mouth daily.   gabapentin (NEURONTIN) 100 MG capsule Take 2 capsules (200 mg total) by mouth at bedtime.   glimepiride (AMARYL) 1 MG tablet TAKE 1 TABLET(1 MG) BY MOUTH DAILY WITH BREAKFAST   hydrALAZINE (APRESOLINE) 100 MG tablet Take 1 tablet (100 mg total) by mouth 3 (three) times daily.   Ipratropium-Albuterol (COMBIVENT) 20-100 MCG/ACT AERS respimat Inhale 1 puff into the lungs every 6 (six) hours as needed for wheezing or shortness of breath.   Lancets (ONETOUCH ULTRASOFT) lancets use as directed    losartan (COZAAR) 100 MG tablet TAKE 1 TABLET(100 MG) BY MOUTH DAILY   metoprolol (TOPROL XL) 200 MG 24 hr tablet Take 1 tablet (200 mg total) by mouth daily.   Omega-3 Fatty Acids (FISH OIL PO) Take 1,000 mg by mouth daily.   ONE TOUCH ULTRA TEST test strip use as directed   rosuvastatin (CRESTOR) 20 MG tablet Take 1 tablet (20 mg total) by mouth daily.     Allergies:   Hydrochlorothiazide and Metformin and related   Social History   Socioeconomic History   Marital status: Married    Spouse name: Vertie   Number of children: 1   Years of education: Not on file   Highest education level: Not on file  Occupational History   Occupation: Part Scientist, research (medical)  Tobacco Use   Smoking status: Never   Smokeless tobacco: Never  Vaping Use   Vaping status: Never Used  Substance and Sexual Activity   Alcohol use: No   Drug use: No   Sexual activity: Not on file  Other Topics Concern   Not on file  Social History Narrative   Not on file   Social Determinants of Health   Financial  Resource Strain: Low Risk  (07/14/2023)   Overall Financial Resource Strain (CARDIA)    Difficulty of Paying Living Expenses: Not hard at all  Food Insecurity: No Food Insecurity (07/14/2023)   Hunger Vital Sign    Worried About Running Out of Food in the Last Year: Never true    Ran Out of Food in the Last Year: Never true  Transportation Needs: No Transportation Needs (07/14/2023)   PRAPARE - Administrator, Civil Service (Medical): No    Lack of Transportation (Non-Medical): No  Physical Activity: Sufficiently Active (07/14/2023)   Exercise Vital Sign    Days of Exercise per Week: 5 days    Minutes of Exercise per Session: 30 min  Stress: No Stress Concern Present (07/14/2023)   [employer] of Occupational Health - Occupational Stress Questionnaire    Feeling of Stress : Not at all  Social Connections: Moderately Isolated (07/14/2023)   Social Connection and Isolation Panel [NHANES]     Frequency of Communication with Friends and Family: Once a week    Frequency of Social Gatherings with Friends and Family: Once a week    Attends Religious Services: 1 to 4 times per year    Active Member of [employer] or Organizations: No    Attends Engineer, structural: Never    Marital Status: Married     Family History: The patient's family history includes Alzheimer's disease in his father and mother; Diabetes in his brother and father; Heart attack in his brother and maternal aunt; Heart attack (age of onset: 20) in his sister; Heart attack (age of onset: 40) in his mother; Heart disease in his brother; Hypertension in his father and mother. There is no history of Stroke, Colon cancer, Esophageal cancer, or Stomach cancer.  ROS:   Please see the history of present illness.    (+) Fatigue (+) Shortness of breath (+) Lower back pain All other systems reviewed and are negative.  EKGs/Labs/Other Studies Reviewed:    24 HR Blood Pressure Monitor  01/03/2023: 24 Hour Ambulatory Blood Pressure Report   Overall Average BP: 153/80 Overall Average HR: 79   Awake Average BP: 152/80 Awake Average HR:80  Asleep Average BP: 156/79 Asleep Average HR: 75  White Coat Period: 143/84, Heart rate 69   Impressions: Uncontrolled hypertension during all periods  Echo  12/08/2022:  1. Left ventricular ejection fraction, by estimation, is 65 to 70%. Left  ventricular ejection fraction by PLAX is 67 %. The left ventricle has  normal function. The left ventricle has no regional wall motion  abnormalities. Left ventricular diastolic  parameters were normal.   2. Right ventricular systolic function is normal. The right ventricular  size is normal. Tricuspid regurgitation signal is inadequate for assessing  PA pressure.   3. The mitral valve is abnormal. Trivial mitral valve regurgitation.   4. The aortic valve is tricuspid. Aortic valve regurgitation is not  visualized.   5. The inferior  vena cava is normal in size with greater than 50%  respiratory variability, suggesting right atrial pressure of 3 mmHg.   Comparison(s): No prior Echocardiogram.   Renal Artery Doppler  12/08/2022: Summary:  Renal:    Right: Abnormal right Resistive Index. Normal cortical thickness of         right kidney. Abnormal size for the right kidney. RRV flow         present. No evidence of right renal artery stenosis.  Left:  1-59% stenosis of the left renal  artery. Abnormal left         Resisitve Index. Normal cortical thickness of the left         kidney. Abnormal size for the left kidney. LRV flow present.  Mesenteric:  Areas of limited visceral study include right kidney size, left kidney size and mesenteric arteries.   ETT  12/31/2021:   No ST deviation was noted.   ETT with fair exercise tolerance (9:00); no chest pain; hypertensive blood pressure response; no diagnostic ST changes; frequent PVCs and 7 beats nonsustained ventricular tachycardia with exercise; negative adequate ETT.  Bilateral Carotid Dopplers  12/17/2021: Summary:  Right Carotid: Velocities in the right ICA are consistent with a 1-39%  stenosis.   Left Carotid: Velocities in the left ICA are consistent with a 1-39%  stenosis.               Non-hemodynamically significant plaque <50% noted in the  CCA.   Vertebrals: Bilateral vertebral arteries demonstrate antegrade flow.  Subclavians: Normal flow hemodynamics were seen in bilateral subclavian               arteries.    EKG:  EKG is personally reviewed. 10/25/2023: Sinus rhythm.  Rate 79 bpm.  PACs.  Cannot rule out prior lateral infarct. 05/03/2023:  EKG was not ordered. 11/10/2022: Sinus tachycardia. PACs. Rate 103 bpm.  Recent Labs: 11/17/2022: TSH 0.815 05/12/2023: ALT 19; Hemoglobin 13.9; Platelets 258.0 05/16/2023: BUN 22; Creatinine, Ser 1.69; Potassium 4.6; Sodium 143   Recent Lipid Panel    Component Value Date/Time   CHOL 121 05/12/2023 0850   TRIG  74.0 05/12/2023 0850   HDL 36.80 (L) 05/12/2023 0850   CHOLHDL 3 05/12/2023 0850   VLDL 14.8 05/12/2023 0850   LDLCALC 70 05/12/2023 0850    Physical Exam:    VS:  BP 132/68   Pulse 79   Ht 6\' 1"  (1.854 m)   Wt 210 lb (95.3 kg)   SpO2 100%   BMI 27.71 kg/m  , BMI Body mass index is 27.71 kg/m. GENERAL:  Well appearing HEENT: Pupils equal round and reactive, fundi not visualized, oral mucosa unremarkable NECK:  No jugular venous distention, waveform within normal limits, carotid upstroke brisk and symmetric, no bruits, no thyromegaly LUNGS:  Clear to auscultation bilaterally HEART:  RRR with frequent ectopy.  PMI not displaced or sustained,S1 and S2 within normal limits, no S3, no S4, no clicks, no rubs, no murmurs ABD:  Flat, positive bowel sounds normal in frequency in pitch, no bruits, no rebound, no guarding, no midline pulsatile mass, no hepatomegaly, no splenomegaly EXT:  2 plus pulses throughout, no edema, no cyanosis no clubbing SKIN:  No rashes no nodules NEURO:  Cranial nerves II through XII grossly intact, motor grossly intact throughout PSYCH:  Cognitively intact, oriented to person place and time   ASSESSMENT/PLAN:   # Hypertension Blood pressure well-controlled on current regimen of Amlodipine, Hydralazine, Losartan, and Metoprolol. Recent readings have been satisfactory. -Continue current medications. -Advise patient to monitor blood pressure regularly and report any significant changes.  # Hyperlipidemia Cholesterol levels well-controlled on Rosuvastatin. Recent total cholesterol was 121 and LDL was 70. -Continue Rosuvastatin.  # History of DVT Patient on Eliquis 2.5mg  twice daily for past history of blood clots. -Continue Eliquis 2.5mg  twice daily.  # Carotid Artery Disease Mild blockage noted on previous ultrasound. Stable over the past year. -Repeat carotid doppler ultrasound in one year and follow-up afterwards.  # CKD3:  Stable after stopping  spironolactone.  # General Health Maintenance Patient is physically active with regular exercise and has a good diet. -Encourage continuation of current lifestyle habits.  Follow-up -Return to clinic in one year for repeat carotid doppler ultrasound and follow-up. -If any issues arise before then, patient to contact the clinic.      Screening for Secondary Hypertension:     11/10/2022   11:53 AM  Causes  Drugs/Herbals Screened     - Comments occasional soda, no EtOH, limits salt with cooking but eats out.  No NSAIDs  Renovascular HTN Screened     - Comments check renal Doppler  Sleep Apnea Screened     - Comments check sleep study  Thyroid Disease Screened  Hyperaldosteronism Screened  Pheochromocytoma N/A  Coarctation of the Aorta Screened     - Comments BP symmetric  Compliance Screened     - Comments Unclear about whether he is taking amloidpine    Relevant Labs/Studies:    Latest Ref Rng & Units 05/16/2023    2:42 PM 05/12/2023    8:50 AM 11/08/2022   10:53 AM  Basic Labs  Sodium 135 - 145 mEq/L 143  138  143   Potassium 3.5 - 5.1 mEq/L 4.6  4.9  4.3   Creatinine 0.40 - 1.50 mg/dL 2.13  0.86  5.78        Latest Ref Rng & Units 11/17/2022    9:53 AM 10/09/2020    8:30 AM  Thyroid   TSH 0.450 - 4.500 uIU/mL 0.815  0.83        Latest Ref Rng & Units 11/17/2022    9:53 AM  Renin/Aldosterone   Aldosterone 0.0 - 30.0 ng/dL <4.6   Aldos/Renin Ratio 0.0 - 30.0 <.4              12/08/2022    8:16 AM  Renovascular   Renal Artery [place] Completed Yes      Medication Adjustments/Labs and Tests Ordered: Current medicines are reviewed at length with the patient today.  Concerns regarding medicines are outlined above.   Orders Placed This Encounter  Procedures   EKG 12-Lead   VAS US CAROTID   No orders of the defined types were placed in this encounter.   Signed, Chilton Si, MD  10/25/2023 8:50 AM    [facility]

## 2023-10-25 ENCOUNTER — Encounter (HOSPITAL_BASED_OUTPATIENT_CLINIC_OR_DEPARTMENT_OTHER): Payer: Self-pay | Admitting: Cardiovascular Disease

## 2023-10-25 ENCOUNTER — Ambulatory Visit (HOSPITAL_BASED_OUTPATIENT_CLINIC_OR_DEPARTMENT_OTHER): Payer: Medicare Other | Admitting: Cardiovascular Disease

## 2023-10-25 VITALS — BP 132/68 | HR 79 | Ht 73.0 in | Wt 210.0 lb

## 2023-10-25 DIAGNOSIS — I6523 Occlusion and stenosis of bilateral carotid arteries: Secondary | ICD-10-CM

## 2023-10-25 DIAGNOSIS — G4733 Obstructive sleep apnea (adult) (pediatric): Secondary | ICD-10-CM

## 2023-10-25 DIAGNOSIS — N1832 Chronic kidney disease, stage 3b: Secondary | ICD-10-CM

## 2023-10-25 DIAGNOSIS — E782 Mixed hyperlipidemia: Secondary | ICD-10-CM

## 2023-10-25 DIAGNOSIS — I10 Essential (primary) hypertension: Secondary | ICD-10-CM | POA: Diagnosis not present

## 2023-10-25 NOTE — Patient Instructions (Addendum)
Medication Instructions:  Your physician recommends that you continue on your current medications as directed. Please refer to the Current Medication list given to you today.   Labwork: NONE   Testing/Procedures: Your physician has requested that you have a carotid duplex. This test is an ultrasound of the carotid arteries in your neck. It looks at blood flow through these arteries that supply the brain with blood. Allow one hour for this exam. There are no restrictions or special instructions. TO BE DONE IN 1 YEAR   Follow-Up: 1 YEAR ABOUT A WEEK AFTER CAROTID

## 2023-10-30 DIAGNOSIS — M48062 Spinal stenosis, lumbar region with neurogenic claudication: Secondary | ICD-10-CM | POA: Diagnosis not present

## 2023-11-13 ENCOUNTER — Telehealth: Payer: Self-pay | Admitting: Internal Medicine

## 2023-11-13 NOTE — Telephone Encounter (Signed)
Patient is requesting eliquis 2.5 - please call [phone]

## 2023-11-15 NOTE — Telephone Encounter (Signed)
Spoke with patient spouse.  They will pick up on Friday during appointment.

## 2023-11-16 NOTE — Telephone Encounter (Signed)
Pt wife asking about samples fodapagliflozin propanediol (FARXIGA) 10 MG TABS tablet also.

## 2023-11-16 NOTE — Progress Notes (Signed)
Subjective:    Patient ID: Alfred Foster, male    DOB: 11-26-1946, 77 y.o.   MRN: [id number]     HPI Alfred Foster is here for follow up of his chronic medical problems.  Has seen ortho - lumbar stenosis - symptoms improved so he has not done PT, but would consider if symptoms get bad again  Still active.  No concerns.  Medications and allergies reviewed with patient and updated if appropriate.  Current Outpatient Medications on File Prior to Visit  Medication Sig Dispense Refill   amLODipine (NORVASC) 10 MG tablet Take 1 tablet (10 mg total) by mouth daily. 90 tablet 3   apixaban (ELIQUIS) 2.5 MG TABS tablet Take 1 tablet (2.5 mg total) by mouth 2 (two) times daily. 120 tablet 2   Ascorbic Acid (VITAMIN C PO) Take 1 tablet by mouth daily.     Cholecalciferol (VITAMIN D3) 1000 UNITS CAPS Take 1,000 Units by mouth daily.     dapagliflozin propanediol (FARXIGA) 10 MG TABS tablet Take 1 tablet (10 mg total) by mouth daily before breakfast. 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  Dx: N82.956 2 each 0   folic acid (FOLVITE) 800 MCG tablet Take 800 mcg by mouth daily.     gabapentin (NEURONTIN) 100 MG capsule Take 2 capsules (200 mg total) by mouth at bedtime. 60 capsule 5   glimepiride (AMARYL) 1 MG tablet TAKE 1 TABLET(1 MG) BY MOUTH DAILY WITH BREAKFAST 30 tablet 5   hydrALAZINE (APRESOLINE) 100 MG tablet Take 1 tablet (100 mg total) by mouth 3 (three) times daily. 270 tablet 3   Ipratropium-Albuterol (COMBIVENT) 20-100 MCG/ACT AERS respimat Inhale 1 puff into the lungs every 6 (six) hours as needed for wheezing or shortness of breath. 4 g 0   Lancets (ONETOUCH ULTRASOFT) lancets use as directed 100 each 1   losartan (COZAAR) 100 MG tablet TAKE 1 TABLET(100 MG) BY MOUTH DAILY 90 tablet 3   metoprolol (TOPROL XL) 200 MG 24 hr tablet Take 1 tablet (200 mg total) by mouth daily. 90 tablet 3   Omega-3 Fatty Acids  (FISH OIL PO) Take 1,000 mg by mouth daily.     ONE TOUCH ULTRA TEST test strip use as directed 100 each 1   rosuvastatin (CRESTOR) 20 MG tablet Take 1 tablet (20 mg total) by mouth daily. 90 tablet 3   spironolactone (ALDACTONE) 25 MG tablet TAKE 1/2 TABLET(12.5 MG) BY MOUTH DAILY 30 tablet 5   apixaban (ELIQUIS) 2.5 MG TABS tablet Take 1 tablet (2.5 mg total) by mouth 2 (two) times daily.     No current facility-administered medications on file prior to visit.     Review of Systems  Constitutional:  Negative for fever.  Respiratory:  Negative for cough, shortness of breath and wheezing.   Cardiovascular:  Positive for leg swelling (controlled). Negative for chest pain and palpitations.  Neurological:  Negative for light-headedness and headaches.       Objective:   Vitals:   11/17/23 1042  BP: 138/84  Pulse: 70  Temp: 98.6 F (37 C)  SpO2: 97%   BP Readings from Last 3 Encounters:  11/17/23 138/84  10/25/23 132/68  10/12/23 126/80   Wt Readings from Last 3 Encounters:  11/17/23 212 lb 9.6 oz (96.4 kg)  10/25/23 210 lb (95.3 kg)  10/12/23 209 lb (94.8 kg)   Body mass index is 28.05  kg/m.    Physical Exam Constitutional:      General: He is not in acute distress.    Appearance: Normal appearance. He is not ill-appearing.  HENT:     Head: Normocephalic and atraumatic.  Eyes:     Conjunctiva/sclera: Conjunctivae normal.  Cardiovascular:     Rate and Rhythm: Normal rate and regular rhythm.     Heart sounds: Normal heart sounds.  Pulmonary:     Effort: Pulmonary effort is normal. No respiratory distress.     Breath sounds: Normal breath sounds. No wheezing or rales.  Musculoskeletal:     Right lower leg: No edema.     Left lower leg: No edema.  Skin:    General: Skin is warm and dry.     Findings: No rash.  Neurological:     Mental Status: He is alert. Mental status is at baseline.  Psychiatric:        Mood and Affect: Mood normal.        Lab Results   Component Value Date   WBC 5.9 05/12/2023   HGB 13.9 05/12/2023   HCT 41.8 05/12/2023   PLT 258.0 05/12/2023   GLUCOSE 94 05/16/2023   CHOL 121 05/12/2023   TRIG 74.0 05/12/2023   HDL 36.80 (L) 05/12/2023   LDLCALC 70 05/12/2023   ALT 19 05/12/2023   AST 19 05/12/2023   NA 143 05/16/2023   K 4.6 05/16/2023   CL 113 (H) 05/16/2023   CREATININE 1.69 (H) 05/16/2023   BUN 22 05/16/2023   CO2 23 05/16/2023   TSH 0.815 11/17/2022   PSA 0.00 (L) 05/12/2023   INR 1.01 12/14/2012   HGBA1C 6.2 05/12/2023   MICROALBUR 1.1 05/12/2023     Assessment & Plan:    See Problem List for Assessment and Plan of chronic medical problems.

## 2023-11-16 NOTE — Patient Instructions (Addendum)
      Blood work was ordered.       Medications changes include :   None      Return in about 6 months (around 05/17/2024) for Physical Exam.

## 2023-11-17 ENCOUNTER — Ambulatory Visit (INDEPENDENT_AMBULATORY_CARE_PROVIDER_SITE_OTHER): Payer: Medicare Other | Admitting: Internal Medicine

## 2023-11-17 ENCOUNTER — Ambulatory Visit: Payer: Medicare Other | Admitting: Internal Medicine

## 2023-11-17 ENCOUNTER — Encounter: Payer: Self-pay | Admitting: Internal Medicine

## 2023-11-17 VITALS — BP 138/84 | HR 70 | Temp 98.6°F | Ht 73.0 in | Wt 212.6 lb

## 2023-11-17 DIAGNOSIS — I10 Essential (primary) hypertension: Secondary | ICD-10-CM | POA: Diagnosis not present

## 2023-11-17 DIAGNOSIS — E1142 Type 2 diabetes mellitus with diabetic polyneuropathy: Secondary | ICD-10-CM | POA: Diagnosis not present

## 2023-11-17 DIAGNOSIS — N1832 Chronic kidney disease, stage 3b: Secondary | ICD-10-CM | POA: Diagnosis not present

## 2023-11-17 DIAGNOSIS — Z86718 Personal history of other venous thrombosis and embolism: Secondary | ICD-10-CM

## 2023-11-17 DIAGNOSIS — E782 Mixed hyperlipidemia: Secondary | ICD-10-CM | POA: Diagnosis not present

## 2023-11-17 DIAGNOSIS — E1165 Type 2 diabetes mellitus with hyperglycemia: Secondary | ICD-10-CM

## 2023-11-17 DIAGNOSIS — M48062 Spinal stenosis, lumbar region with neurogenic claudication: Secondary | ICD-10-CM

## 2023-11-17 LAB — CBC WITH DIFFERENTIAL/PLATELET
Basophils Absolute: 0.1 10*3/uL (ref 0.0–0.1)
Basophils Relative: 1.4 % (ref 0.0–3.0)
Eosinophils Absolute: 0.2 10*3/uL (ref 0.0–0.7)
Eosinophils Relative: 3.6 % (ref 0.0–5.0)
HCT: 42.9 % (ref 39.0–52.0)
Hemoglobin: 15.2 g/dL (ref 13.0–17.0)
Lymphocytes Relative: 25 % (ref 12.0–46.0)
Lymphs Abs: 1.6 10*3/uL (ref 0.7–4.0)
MCHC: 35.4 g/dL (ref 30.0–36.0)
MCV: 97.6 fL (ref 78.0–100.0)
Monocytes Absolute: 0.7 10*3/uL (ref 0.1–1.0)
Monocytes Relative: 11.1 % (ref 3.0–12.0)
Neutro Abs: 3.7 10*3/uL (ref 1.4–7.7)
Neutrophils Relative %: 58.9 % (ref 43.0–77.0)
Platelets: 261 10*3/uL (ref 150.0–400.0)
RBC: 4.4 Mil/uL (ref 4.22–5.81)
RDW: 14.1 % (ref 11.5–15.5)
WBC: 6.3 10*3/uL (ref 4.0–10.5)

## 2023-11-17 LAB — COMPREHENSIVE METABOLIC PANEL
ALT: 21 U/L (ref 0–53)
AST: 25 U/L (ref 0–37)
Albumin: 4.4 g/dL (ref 3.5–5.2)
Alkaline Phosphatase: 92 U/L (ref 39–117)
BUN: 25 mg/dL — ABNORMAL HIGH (ref 6–23)
CO2: 24 meq/L (ref 19–32)
Calcium: 9.5 mg/dL (ref 8.4–10.5)
Chloride: 109 meq/L (ref 96–112)
Creatinine, Ser: 1.68 mg/dL — ABNORMAL HIGH (ref 0.40–1.50)
GFR: 39.27 mL/min — ABNORMAL LOW (ref >60.00)
Glucose, Bld: 95 mg/dL (ref 70–99)
Potassium: 4.3 meq/L (ref 3.5–5.1)
Sodium: 140 meq/L (ref 135–145)
Total Bilirubin: 0.6 mg/dL (ref 0.2–1.2)
Total Protein: 7.3 g/dL (ref 6.0–8.3)

## 2023-11-17 LAB — LIPID PANEL
Cholesterol: 129 mg/dL (ref 0–200)
HDL: 39.4 mg/dL (ref >39.00)
LDL Cholesterol: 73 mg/dL (ref 0–99)
NonHDL: 89.65
Total CHOL/HDL Ratio: 3
Triglycerides: 83 mg/dL (ref 0.0–149.0)
VLDL: 16.6 mg/dL (ref 0.0–40.0)

## 2023-11-17 LAB — HEMOGLOBIN A1C: Hgb A1c MFr Bld: 6.2 % (ref 4.6–6.5)

## 2023-11-17 NOTE — Assessment & Plan Note (Signed)
Chronic Stressed good blood pressure, sugar control Encouraged weight loss No NSAIDs Continue increased water intake CBC, CMP

## 2023-11-17 NOTE — Assessment & Plan Note (Addendum)
Saw [facility] Symptoms improved Has pain in left latera pain - occ pain in left leg Continue gabapentin 200 mg nightly as needed

## 2023-11-17 NOTE — Assessment & Plan Note (Signed)
Chronic History of DVT x 2 Lifelong AC Continue Eliquis 2.5 mg twice daily CBC, CMP

## 2023-11-17 NOTE — Assessment & Plan Note (Signed)
Chronic Blood pressure controlled CMP Continue amlodipine 10 mg daily, losartan 100 mg daily, metoprolol XL 200 mg daily, spironolactone 12.5 mg daily, hydralazine to 100 mg 3 times daily

## 2023-11-17 NOTE — Assessment & Plan Note (Signed)
Chronic Regular exercise and healthy diet encouraged Check lipid panel today Continue Crestor 20 mg daily

## 2023-11-17 NOTE — Assessment & Plan Note (Signed)
asymptomatic He is not sure why he is taking the gabapentin-advised it was either for his neuropathy or lumbar radiculopathy-he will try stopping this and seeing if he really needs it

## 2023-11-17 NOTE — Assessment & Plan Note (Signed)
Chronic  Lab Results  Component Value Date   HGBA1C 6.2 05/12/2023   Sugars controlled Testing sugars 1 times a day Check A1c  Continue glimepiride 1 mg daily, Farxiga 10 mg daily Stressed regular exercise, diabetic diet

## 2023-11-20 ENCOUNTER — Other Ambulatory Visit: Payer: Self-pay | Admitting: Internal Medicine

## 2023-12-01 DIAGNOSIS — H25012 Cortical age-related cataract, left eye: Secondary | ICD-10-CM | POA: Diagnosis not present

## 2023-12-01 DIAGNOSIS — E119 Type 2 diabetes mellitus without complications: Secondary | ICD-10-CM | POA: Diagnosis not present

## 2023-12-01 DIAGNOSIS — H2512 Age-related nuclear cataract, left eye: Secondary | ICD-10-CM | POA: Diagnosis not present

## 2023-12-01 DIAGNOSIS — Z961 Presence of intraocular lens: Secondary | ICD-10-CM | POA: Diagnosis not present

## 2023-12-01 DIAGNOSIS — H52203 Unspecified astigmatism, bilateral: Secondary | ICD-10-CM | POA: Diagnosis not present

## 2023-12-01 LAB — HM DIABETES EYE EXAM

## 2023-12-12 ENCOUNTER — Telehealth: Payer: Self-pay | Admitting: Internal Medicine

## 2023-12-12 NOTE — Telephone Encounter (Signed)
Spoke with Vertie and samples left up front for pick up.

## 2023-12-12 NOTE — Telephone Encounter (Signed)
 Copied from CRM [id number]. Topic: Clinical - Medication Question >> Dec 12, 2023  9:08 AM Robinson H wrote: Reason for CRM: Patients wife states they need samples of the dapagliflozin  propanediol (FARXIGA ) 10 MG TABS tablet to hold patient over until new year, wife states medication is too expensive at pharmacy to get right now.   Vertie [phone]

## 2023-12-18 ENCOUNTER — Telehealth: Payer: Self-pay

## 2023-12-18 NOTE — Telephone Encounter (Signed)
 Copied from CRM [id number]. Topic: Clinical - Prescription Issue >> Dec 18, 2023 11:03 AM Evie B wrote:  Reason for CRM: Pt's wife called requesting the provider to write a prescription for the Generic brand of dapagliflozin  propanediol (FARXIGA ) 10 MG TABS tablet, which is  Dapagliflozin . Requesting to change apixaban  (ELIQUIS ) 2.5 MG TABS tablet to Generic brand also Apixaban  2.5 MG.

## 2023-12-18 NOTE — Telephone Encounter (Signed)
Spoke with wife today. ?

## 2024-01-05 ENCOUNTER — Encounter: Payer: Self-pay | Admitting: Podiatry

## 2024-01-05 ENCOUNTER — Ambulatory Visit: Payer: Medicare Other | Admitting: Podiatry

## 2024-01-05 DIAGNOSIS — E119 Type 2 diabetes mellitus without complications: Secondary | ICD-10-CM | POA: Diagnosis not present

## 2024-01-05 DIAGNOSIS — M79674 Pain in right toe(s): Secondary | ICD-10-CM | POA: Diagnosis not present

## 2024-01-05 DIAGNOSIS — D689 Coagulation defect, unspecified: Secondary | ICD-10-CM

## 2024-01-05 DIAGNOSIS — B351 Tinea unguium: Secondary | ICD-10-CM

## 2024-01-05 DIAGNOSIS — M79675 Pain in left toe(s): Secondary | ICD-10-CM

## 2024-01-08 NOTE — Progress Notes (Signed)
Subjective: Chief Complaint  Patient presents with   [facility]    RM#[id number] Patient has no concerns at this time.    78 y.o. returns the office today for painful, elongated, thickened toenails which he cannot trim himself. Denies any redness or drainage around the nails.  The nails grow out long and they cause discomfort.  He states he had a trauma couple of manage but not long.  No open lesions.  He is on Eliquis  PCP: Pincus Sanes, MD Last Seen: 11/17/2023 A1c: 6.2 on 11/17/2023  Objective: AAO 3, NAD DP pulse 2/4, PT 1/4 b/l, RT < 3 seconds Nails hypertrophic, dystrophic, elongated, brittle, discolored 10. There is tenderness overlying the nails 1-5 bilaterally. There is no surrounding erythema or drainage along the nail sites. Decreased range of motion of the first MPJ bilaterally right side worse than left.  No significant pain at this area. No pain with calf compression, swelling, warmth, erythema.  Assessment: Patient presents with symptomatic onychomycosis  Plan: -Treatment options including alternatives, risks, complications were discussed -Nails sharply debrided 10 without complication/bleeding. Penlac for the nail fungus.  -Daily foot inspection. -Follow-up in 3 months or sooner if any problems are to arise. In the meantime, encouraged to call the office with any questions, concerns, changes symptoms.  Return in about 3 months (around 04/04/2024).  Ovid Curd, DPM

## 2024-01-16 ENCOUNTER — Telehealth: Payer: Self-pay | Admitting: Internal Medicine

## 2024-01-16 NOTE — Telephone Encounter (Signed)
Spoke with wife today and samples left up front for pick up.

## 2024-01-16 NOTE — Telephone Encounter (Signed)
 Copied from CRM [id number]. Topic: Clinical - Medication Question >> Jan 16, 2024  8:35 AM Melissa C wrote: Reason for CRM: patient wants to know if there is any sample farxiga  medicine/the doctor's office has any samples of it. Please advise with patient's wife. Thank you

## 2024-02-05 NOTE — Telephone Encounter (Signed)
 Copied from CRM [id number]. Topic: Clinical - Medication Question >> Feb 05, 2024  8:25 AM Martinique E wrote: Reason for CRM: Patient's wife called in regarding her husband's apixaban (ELIQUIS) 2.5 MG TABS tablet. She would like a sample of this medication in order to meet their deductible for insurance.

## 2024-02-06 NOTE — Telephone Encounter (Signed)
 Samples given today.

## 2024-02-09 ENCOUNTER — Ambulatory Visit: Payer: Medicare Other

## 2024-02-09 DIAGNOSIS — M2141 Flat foot [pes planus] (acquired), right foot: Secondary | ICD-10-CM

## 2024-02-09 DIAGNOSIS — M2041 Other hammer toe(s) (acquired), right foot: Secondary | ICD-10-CM

## 2024-02-09 DIAGNOSIS — L84 Corns and callosities: Secondary | ICD-10-CM

## 2024-02-09 DIAGNOSIS — E119 Type 2 diabetes mellitus without complications: Secondary | ICD-10-CM

## 2024-02-09 NOTE — Telephone Encounter (Signed)
 Patient came to pickup Eliquis samples (2.5mg ) and there are none of this strength available at this time. No additional questions at this time.

## 2024-02-09 NOTE — Progress Notes (Signed)
 Patient presents to the office today for diabetic shoe and insole measuring.  Patient was measured with brannock device to determine size and width for 1 pair of extra depth shoes and foam casted for 3 pair of insoles.   Documentation of medical necessity will be sent to patient's treating diabetic doctor to verify and sign.   Patient's diabetic provider: Cheryll Cockayne  Shoes and insoles will be ordered at that time and patient will be notified for an appointment for fitting when they arrive.   Shoe size (per patient): 11 Shoe choice:   Y910M / V551M Shoe size ordered: 11Xw

## 2024-02-27 ENCOUNTER — Telehealth (HOSPITAL_BASED_OUTPATIENT_CLINIC_OR_DEPARTMENT_OTHER): Payer: Self-pay | Admitting: Cardiovascular Disease

## 2024-02-27 NOTE — Telephone Encounter (Signed)
 Pt c/o Shortness Of Breath: STAT if SOB developed within the last 24 hours or pt is noticeably SOB on the phone  1. Are you currently SOB (can you hear that pt is SOB on the phone)?  No  2. How long have you been experiencing SOB?  Started last year  3. Are you SOB when sitting or when up moving around?  Both sitting and when up and moving around  4. Are you currently experiencing any other symptoms?   Unsure  Wife Kassie Mends) stated patient has been having SOB and "breathing problem".  Wife wants call back to discuss next steps.

## 2024-02-27 NOTE — Telephone Encounter (Signed)
 Called and spoke to patient's spouse. DOB verified.  Patient's concerns:  - pt gets tired and SOB, mouth breaths a lot.  - pt is slightly more lethargic lately.  - pt had c/o leg cramps, legs don't seem "right"  - seen by PCP on 3/11 about ear pain; was not SOB at this time.  - unable to give BP readings.  - pt does not have a pulmonologist.  Nurse's Recommendations:  - scheduled pt to see APP on 4/4 @ 10:05 (earliest appt available, spouse stated pt would not go to any other office)  - advise active CP protocol, to call 911.  Patient verbalized understanding.

## 2024-02-28 DIAGNOSIS — I701 Atherosclerosis of renal artery: Secondary | ICD-10-CM | POA: Diagnosis not present

## 2024-02-28 DIAGNOSIS — E785 Hyperlipidemia, unspecified: Secondary | ICD-10-CM | POA: Diagnosis not present

## 2024-02-28 DIAGNOSIS — N1832 Chronic kidney disease, stage 3b: Secondary | ICD-10-CM | POA: Diagnosis not present

## 2024-02-28 DIAGNOSIS — I129 Hypertensive chronic kidney disease with stage 1 through stage 4 chronic kidney disease, or unspecified chronic kidney disease: Secondary | ICD-10-CM | POA: Diagnosis not present

## 2024-02-28 DIAGNOSIS — E1122 Type 2 diabetes mellitus with diabetic chronic kidney disease: Secondary | ICD-10-CM | POA: Diagnosis not present

## 2024-02-28 DIAGNOSIS — Z86718 Personal history of other venous thrombosis and embolism: Secondary | ICD-10-CM | POA: Diagnosis not present

## 2024-02-29 LAB — LAB REPORT - SCANNED
Albumin, Urine POC: 5.3
Albumin/Creatinine Ratio, Urine, POC: 3
Creatinine, POC: 208.2 mg/dL
EGFR: 33

## 2024-03-15 ENCOUNTER — Ambulatory Visit (HOSPITAL_BASED_OUTPATIENT_CLINIC_OR_DEPARTMENT_OTHER): Admitting: Family

## 2024-03-15 ENCOUNTER — Encounter (HOSPITAL_BASED_OUTPATIENT_CLINIC_OR_DEPARTMENT_OTHER): Payer: Self-pay | Admitting: Family

## 2024-03-15 VITALS — BP 110/60 | HR 83 | Ht 73.0 in | Wt 209.0 lb

## 2024-03-15 DIAGNOSIS — R0600 Dyspnea, unspecified: Secondary | ICD-10-CM | POA: Diagnosis not present

## 2024-03-15 DIAGNOSIS — R0609 Other forms of dyspnea: Secondary | ICD-10-CM | POA: Diagnosis not present

## 2024-03-15 DIAGNOSIS — I701 Atherosclerosis of renal artery: Secondary | ICD-10-CM | POA: Diagnosis not present

## 2024-03-15 DIAGNOSIS — G4733 Obstructive sleep apnea (adult) (pediatric): Secondary | ICD-10-CM | POA: Diagnosis not present

## 2024-03-15 DIAGNOSIS — Z86718 Personal history of other venous thrombosis and embolism: Secondary | ICD-10-CM

## 2024-03-15 DIAGNOSIS — I10 Essential (primary) hypertension: Secondary | ICD-10-CM | POA: Diagnosis not present

## 2024-03-15 MED ORDER — APIXABAN 2.5 MG PO TABS: 2.5000 mg | ORAL_TABLET | Freq: Two times a day (BID) | ORAL | Status: DC

## 2024-03-15 NOTE — Progress Notes (Signed)
 Advanced Hypertension Clinic Assessment:    Date:  03/15/2024   ID:  Alfred Foster, DOB [birth year], MRN [id number]  PCP:  Pincus Sanes, MD  Cardiologist:  None  Nephrologist:  Referring MD: Pincus Sanes, MD   CC: Hypertension  History of Present Illness:    Alfred Foster is a 78 y.o. male with a hx of carotid stenosis, DVT, CKDIII, HTN, HLD, renal artery stenosis, DM2, prostate cancer here to follow up in the Advanced Hypertension Clinic.   Saw Dr. Antoine Poche 12/2021 for shortness of breath. ETT frequent PVC and up to 7 beats of NSVT with no ischemia. Carotid duplex mild stenosis bilaterally. Metoprolol increased. Hydralazine later increased by PCP.   Established with Dr. Duke Salvia and ADV HTN clinic 11/10/22. He noted labile blood pressure and sciatic nerve pain. 24h blood pressure monitor was ordered. Renal artery dopplers with left 1-59% stenosis. Renin/aldosterone and TSH were normal. Due to exertional dyspnea, echocardiogram ordered which revealed normal LVEF, no significant valvular abnormalities. Sleep study ordered due to snoring revealed performed but results not yet available.   He presents today with his wife with concerns of dyspnea on exertion. 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 He is still able to do his daily activities,but does have to stop and has noticed his breathing worsens when exerting himself or with bending over. His wife has been worried and noticed his breathing worsening over the last couple of months particularly as his sister and mother have had heart attacks. Reports no chest pain, pressure, or tightness. No edema, orthopnea, PND. Reports no palpitations.    Previous antihypertensives: HCTZ - shortness of breath, dizziness  Past Medical History:  Diagnosis Date   Diabetes mellitus    Hyperlipidemia    Hypertension    OSA on CPAP 11/10/2022   Prostate cancer (HCC) 12/12/2005   Dr Laverle Patter    Snoring 11/10/2022    Past Surgical History:  Procedure Laterality Date   COLONOSCOPY  2004 & 2014   Negative ;[facility]   EYE SURGERY  09/06/2022   Cataract surgery   INGUINAL HERNIA REPAIR     PROSTATECTOMY  04/11/2006   Dr Laverle Patter   UMBILICAL HERNIA REPAIR      Current Medications: Current Meds  Medication Sig   amLODipine (NORVASC) 10 MG tablet Take 1 tablet (10 mg total) by mouth daily.   apixaban (ELIQUIS) 2.5 MG TABS tablet Take 1 tablet (2.5 mg total) by mouth 2 (two) times daily.   apixaban (ELIQUIS) 5 MG TABS tablet Take 5 mg by mouth daily at 6 (six) AM.   Ascorbic Acid (VITAMIN C PO) Take 1 tablet by mouth daily.   Cholecalciferol (VITAMIN D3) 1000 UNITS CAPS Take 1,000 Units by mouth daily.   dapagliflozin propanediol (FARXIGA) 10 MG TABS tablet Take 1 tablet (10 mg total) by mouth daily before breakfast.   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  Dx: V25.366   folic acid (FOLVITE) 800 MCG tablet Take 800 mcg by mouth daily.   gabapentin (NEURONTIN) 100 MG capsule Take 2 capsules (200 mg total) by mouth at bedtime.   glimepiride (AMARYL) 1 MG tablet TAKE 1 TABLET(1 MG) BY MOUTH DAILY WITH BREAKFAST   hydrALAZINE (APRESOLINE) 100 MG tablet Take 1 tablet (100 mg total) by mouth 3 (three) times daily.   Ipratropium-Albuterol (COMBIVENT) 20-100 MCG/ACT AERS respimat  Inhale 1 puff into the lungs every 6 (six) hours as needed for wheezing or shortness of breath.   Lancets (ONETOUCH ULTRASOFT) lancets use as directed   losartan (COZAAR) 100 MG tablet TAKE 1 TABLET(100 MG) BY MOUTH DAILY   metoprolol (TOPROL XL) 200 MG 24 hr tablet Take 1 tablet (200 mg total) by mouth daily.   Omega-3 Fatty Acids (FISH OIL PO) Take 1,000 mg by mouth daily.   ONE TOUCH ULTRA TEST test strip use as directed   rosuvastatin (CRESTOR) 20 MG tablet TAKE 1 TABLET(20 MG) BY MOUTH DAILY   spironolactone (ALDACTONE) 25 MG tablet TAKE 1/2  TABLET(12.5 MG) BY MOUTH DAILY   [DISCONTINUED] apixaban (ELIQUIS) 2.5 MG TABS tablet Take 1 tablet (2.5 mg total) by mouth 2 (two) times daily.     Allergies:   Hydrochlorothiazide and Metformin and related   Social History   Socioeconomic History   Marital status: Married    Spouse name: Vertie   Number of children: 1   Years of education: Not on file   Highest education level: Not on file  Occupational History   Occupation: Part Scientist, research (medical)  Tobacco Use   Smoking status: Never   Smokeless tobacco: Never  Vaping Use   Vaping status: Never Used  Substance and Sexual Activity   Alcohol use: No   Drug use: No   Sexual activity: Not on file  Other Topics Concern   Not on file  Social History Narrative   Not on file   Social Drivers of Health   Financial Resource Strain: Low Risk  (07/14/2023)   Overall Financial Resource Strain (CARDIA)    Difficulty of Paying Living Expenses: Not hard at all  Food Insecurity: No Food Insecurity (07/14/2023)   Hunger Vital Sign    Worried About Running Out of Food in the Last Year: Never true    Ran Out of Food in the Last Year: Never true  Transportation Needs: No Transportation Needs (07/14/2023)   PRAPARE - Administrator, Civil Service (Medical): No    Lack of Transportation (Non-Medical): No  Physical Activity: Sufficiently Active (07/14/2023)   Exercise Vital Sign    Days of Exercise per Week: 5 days    Minutes of Exercise per Session: 30 min  Stress: No Stress Concern Present (07/14/2023)   [employer] of Occupational Health - Occupational Stress Questionnaire    Feeling of Stress : Not at all  Social Connections: Moderately Isolated (07/14/2023)   Social Connection and Isolation Panel [NHANES]    Frequency of Communication with Friends and Family: Once a week    Frequency of Social Gatherings with Friends and Family: Once a week    Attends Religious Services: 1 to 4 times per year    Active Member of [employer] or  Organizations: No    Attends Engineer, structural: Never    Marital Status: Married    Family History: The patient's family history includes Alzheimer's disease in his father and mother; Diabetes in his brother and father; Heart attack in his brother and maternal aunt; Heart attack (age of onset: 44) in his sister; Heart attack (age of onset: 72) in his mother; Heart disease in his brother; Hypertension in his father and mother. There is no history of Stroke, Colon cancer, Esophageal cancer, or Stomach cancer.  ROS:   Please see the history of present illness.     All other systems reviewed and are negative.  EKGs/Labs/Other Studies Reviewed:  Cardiac Studies & Procedures   ______________________________________________________________________________________________   STRESS TESTS  EXERCISE TOLERANCE TEST (ETT) 12/31/2021  Narrative   No ST deviation was noted.  ETT with fair exercise tolerance (9:00); no chest pain; hypertensive blood pressure response; no diagnostic ST changes; frequent PVCs and 7 beats nonsustained ventricular tachycardia with exercise; negative adequate ETT.   ECHOCARDIOGRAM  ECHOCARDIOGRAM COMPLETE 12/08/2022  Narrative ECHOCARDIOGRAM REPORT    Patient Name:   ARIEON CORCORAN Date of Exam: 12/08/2022 Medical Rec #:  [id number]      Height:       73.0 in Accession #:    [id number]     Weight:       216.0 lb Date of Birth:  12/12/1946      BSA:          2.223 m Patient Age:    76 years       BP:           110/78 mmHg Patient Gender: M              HR:           59 bpm. Exam Location:  Outpatient  Procedure: 2D Echo, 3D Echo, Color Doppler, Cardiac Doppler and Strain Analysis  Indications:    R06.02 SOB  History:        Patient has no prior history of Echocardiogram examinations. CAD; Risk Factors:Dyslipidemia, Diabetes, Non-Smoker and Hypertension. History of prostate cancer.  Sonographer:    Jeryl Columbia RDCS Referring Phys:  [id number] TIFFANY Norton  IMPRESSIONS   1. Left ventricular ejection fraction, by estimation, is 65 to 70%. Left ventricular ejection fraction by PLAX is 67 %. The left ventricle has normal function. The left ventricle has no regional wall motion abnormalities. Left ventricular diastolic parameters were normal. 2. Right ventricular systolic function is normal. The right ventricular size is normal. Tricuspid regurgitation signal is inadequate for assessing PA pressure. 3. The mitral valve is abnormal. Trivial mitral valve regurgitation. 4. The aortic valve is tricuspid. Aortic valve regurgitation is not visualized. 5. The inferior vena cava is normal in size with greater than 50% respiratory variability, suggesting right atrial pressure of 3 mmHg.  Comparison(s): No prior Echocardiogram.  FINDINGS Left Ventricle: Left ventricular ejection fraction, by estimation, is 65 to 70%. Left ventricular ejection fraction by PLAX is 67 %. The left ventricle has normal function. The left ventricle has no regional wall motion abnormalities. The left ventricular internal cavity size was normal in size. There is no left ventricular hypertrophy. Left ventricular diastolic parameters were normal.  Right Ventricle: The right ventricular size is normal. No increase in right ventricular wall thickness. Right ventricular systolic function is normal. Tricuspid regurgitation signal is inadequate for assessing PA pressure.  Left Atrium: Left atrial size was normal in size.  Right Atrium: Right atrial size was normal in size.  Pericardium: There is no evidence of pericardial effusion.  Mitral Valve: The mitral valve is abnormal. There is mild thickening of the anterior and posterior mitral valve leaflet(s). Trivial mitral valve regurgitation.  Tricuspid Valve: The tricuspid valve is grossly normal. Tricuspid valve regurgitation is mild.  Aortic Valve: The aortic valve is tricuspid. Aortic valve regurgitation is  not visualized.  Pulmonic Valve: The pulmonic valve was normal in structure. Pulmonic valve regurgitation is not visualized.  Aorta: The aortic root and ascending aorta are structurally normal, with no evidence of dilitation.  Venous: The inferior vena cava is normal in size with greater than 50% respiratory variability,  suggesting right atrial pressure of 3 mmHg.  IAS/Shunts: No atrial level shunt detected by color flow Doppler.   LEFT VENTRICLE PLAX 2D LV EF:         Left            Diastology ventricular     LV e' medial:    10.00 cm/s ejection        LV E/e' medial:  9.0 fraction by     LV e' lateral:   10.80 cm/s PLAX is 67      LV E/e' lateral: 8.3 %. LVIDd:         4.43 cm LVIDs:         2.78 cm LV PW:         0.89 cm LV IVS:        0.81 cm         3D Volume EF: LVOT diam:     2.10 cm         3D EF:        42 % LV SV:         69              LV EDV:       134 ml LV SV Index:   31              LV ESV:       78 ml LVOT Area:     3.46 cm        LV SV:        56 ml   RIGHT VENTRICLE RV Basal diam:  3.74 cm RV Mid diam:    3.03 cm RV S prime:     13.70 cm/s TAPSE (M-mode): 1.8 cm  LEFT ATRIUM             Index        RIGHT ATRIUM           Index LA diam:        3.20 cm 1.44 cm/m   RA Area:     14.00 cm LA Vol (A2C):   72.1 ml 32.43 ml/m  RA Volume:   35.00 ml  15.74 ml/m LA Vol (A4C):   39.2 ml 17.63 ml/m LA Biplane Vol: 58.9 ml 26.49 ml/m AORTIC VALVE LVOT Vmax:   95.90 cm/s LVOT Vmean:  59.200 cm/s LVOT VTI:    0.199 m  AORTA Ao Root diam: 3.40 cm Ao Asc diam:  3.30 cm  MITRAL VALVE MV Area (PHT): 4.15 cm    SHUNTS MV Decel Time: 183 msec    Systemic VTI:  0.20 m MV E velocity: 90.10 cm/s  Systemic Diam: 2.10 cm MV A velocity: 69.20 cm/s MV E/A ratio:  1.30  Zoila Shutter MD Electronically signed by Zoila Shutter MD Signature Date/Time: 12/08/2022/2:35:27 PM    Final           ______________________________________________________________________________________________         Recent Labs: 11/17/2023: ALT 21; BUN 25; Creatinine, Ser 1.68; Hemoglobin 15.2; Platelets 261.0; Potassium 4.3; Sodium 140   Recent Lipid Panel    Component Value Date/Time   CHOL 129 11/17/2023 1138   TRIG 83.0 11/17/2023 1138   HDL 39.40 11/17/2023 1138   CHOLHDL 3 11/17/2023 1138   VLDL 16.6 11/17/2023 1138   LDLCALC 73 11/17/2023 1138    Physical Exam:   VS:  BP 110/60 (BP Location: Left Arm)   Pulse 83   Ht 6\' 1"  (  1.854 m)   Wt 209 lb (94.8 kg)   SpO2 96%   BMI 27.57 kg/m  , BMI Body mass index is 27.57 kg/m. GENERAL:  Well appearing HEENT: Pupils equal round and reactive, fundi not visualized, oral mucosa unremarkable NECK:  No jugular venous distention, waveform within normal limits, carotid upstroke brisk and symmetric, no bruits, no thyromegaly LYMPHATICS:  No cervical adenopathy LUNGS:  Clear to auscultation bilaterally HEART:  RRR.  PMI not displaced or sustained,S1 and S2 within normal limits, no S3, no S4, no clicks, no rubs, no murmurs ABD:  Flat, positive bowel sounds normal in frequency in pitch, no bruits, no rebound, no guarding, no midline pulsatile mass, no hepatomegaly, no splenomegaly EXT:  2 plus pulses throughout, no edema, no cyanosis no clubbing SKIN:  No rashes no nodules NEURO:  Cranial nerves II through XII grossly intact, motor grossly intact throughout PSYCH:  Cognitively intact, oriented to person place and time   ASSESSMENT/PLAN:    HTN -  BP controlled in clinic, continue same medications.   SOB - Echo 12/2022 normal LVEF, no significant valvular abnormalities. ETT 12/31/21 low risk study. Reports worsening DOE when working and taking care of his wife ongoing 2 months. Has no LE swelling and has never been diagnosed with anemia. Last 11/17/23 GFR 39.27 and creatinine 1.68.  Recheck CBC, BNP. and BMET.   If BNP elevated, will update  echocardiogram Depending on BMET results will determine of proceeding with cardiac CT vs. myoview. While he has been taking Eliquis only daily (see below) low suspicion for PE as dyspnea occurs only with exertion and he reports no tachycardia.   Informed Consent   Shared Decision Making/Informed Consent The risks [chest pain, shortness of breath, cardiac arrhythmias, dizziness, blood pressure fluctuations, myocardial infarction, stroke/transient ischemic attack, nausea, vomiting, allergic reaction, radiation exposure, metallic taste sensation and life-threatening complications (estimated to be 1 in 10,000)], benefits (risk stratification, diagnosing coronary artery disease, treatment guidance) and alternatives of a nuclear stress test were discussed in detail with Mr. Stockley and he agrees to proceed.      OSA - Sleep study performed 12/16/22 showing severe OSA. Encourage CPAP compliance.   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 Continue to follow with PCP. Continue Rosuvastatin 20 mg. Not on aspirin with addition of Eliquis.   Renal artery stenosis - 12/2022 right renal artery 1-59% stenosis. Repeat duplex in one year already ordered.   History of DVT of lower extremity- Continue Eliquis 2.5 mg BID. Has been taking Eliquis 5mg  daily, discussed need for 2.5mg  BID dosing. Samples provided and given information for [facility]. Will route to PCP so their team is aware as predominantly managed  by PCP.   Screening for Secondary Hypertension:     11/10/2022   11:53 AM  Causes  Drugs/Herbals Screened     - Comments occasional soda, no EtOH, limits salt with cooking but eats out.  No NSAIDs  Renovascular HTN Screened     - Comments check renal Doppler  Sleep Apnea Screened     - Comments check sleep study  Thyroid Disease Screened  Hyperaldosteronism Screened  Pheochromocytoma N/A  Coarctation of the Aorta Screened     - Comments BP symmetric  Compliance Screened      - Comments Unclear about whether he is taking amloidpine    Relevant Labs/Studies:    Latest Ref Rng & Units 11/17/2023   11:38 AM 05/16/2023    2:42 PM 05/12/2023  8:50 AM  Basic Labs  Sodium 135 - 145 mEq/L 140  143  138   Potassium 3.5 - 5.1 mEq/L 4.3  4.6  4.9   Creatinine 0.40 - 1.50 mg/dL 1.61  0.96  0.45        Latest Ref Rng & Units 11/17/2022    9:53 AM 10/09/2020    8:30 AM  Thyroid   TSH 0.450 - 4.500 uIU/mL 0.815  0.83        Latest Ref Rng & Units 11/17/2022    9:53 AM  Renin/Aldosterone   Aldosterone 0.0 - 30.0 ng/dL <4.0   Aldos/Renin Ratio 0.0 - 30.0 <.4              12/08/2022    8:16 AM  Renovascular   Renal Artery [place] Completed Yes    Disposition:    FU with MD/PharmD in 2 months    Medication Adjustments/Labs and Tests Ordered: Current medicines are reviewed at length with the patient today.  Concerns regarding medicines are outlined above.  Orders Placed This Encounter  Procedures   Basic metabolic panel with GFR   Brain natriuretic peptide   CBC   Meds ordered this encounter  Medications   apixaban (ELIQUIS) 2.5 MG TABS tablet    Sig: Take 1 tablet (2.5 mg total) by mouth 2 (two) times daily.    Lot Number?:   [id number]    Expiration Date?:   11/11/2024     Signed, Alfred Sorrow, NP  03/15/2024 11:45 AM    [facility]

## 2024-03-15 NOTE — Patient Instructions (Signed)
 Medication Instructions:  Your physician recommends that you continue on your current medications as directed. Please refer to the Current Medication list given to you today.  *If you need a refill on your cardiac medications before your next appointment, please call your pharmacy*  Lab Work: Your physician recommends that you return for lab work today- BMP, BNP, & CBC   Follow-Up: At [facility], you and your health needs are our priority.  As part of our continuing mission to provide you with exceptional heart care, our providers are all part of one team.  This team includes your primary Cardiologist (physician) and Advanced Practice Providers or APPs (Physician Assistants and Nurse Practitioners) who all work together to provide you with the care you need, when you need it.  Your next appointment:   2 months with Dr. Duke Salvia, Gillian Shields, NP or Eligha Bridegroom, NP   Other Instructions We have given you two weeks of Eliquis samples today   We will decide on testing based on lab results

## 2024-03-16 LAB — CBC
Hematocrit: 40 % (ref 37.5–51.0)
Hemoglobin: 13.2 g/dL (ref 13.0–17.7)
MCH: 31.8 pg (ref 26.6–33.0)
MCHC: 33 g/dL (ref 31.5–35.7)
MCV: 96 fL (ref 79–97)
Platelets: 226 10*3/uL (ref 150–450)
RBC: 4.15 x10E6/uL (ref 4.14–5.80)
RDW: 12.8 % (ref 11.6–15.4)
WBC: 6 10*3/uL (ref 3.4–10.8)

## 2024-03-16 LAB — BASIC METABOLIC PANEL WITH GFR
BUN/Creatinine Ratio: 14 (ref 10–24)
BUN: 27 mg/dL (ref 8–27)
CO2: 19 mmol/L — ABNORMAL LOW (ref 20–29)
Calcium: 9.5 mg/dL (ref 8.6–10.2)
Chloride: 107 mmol/L — ABNORMAL HIGH (ref 96–106)
Creatinine, Ser: 1.88 mg/dL — ABNORMAL HIGH (ref 0.76–1.27)
Glucose: 106 mg/dL — ABNORMAL HIGH (ref 70–99)
Potassium: 4.8 mmol/L (ref 3.5–5.2)
Sodium: 140 mmol/L (ref 134–144)
eGFR: 37 mL/min/{1.73_m2} — ABNORMAL LOW (ref >59)

## 2024-03-16 LAB — BRAIN NATRIURETIC PEPTIDE: BNP: 7.7 pg/mL (ref 0.0–100.0)

## 2024-03-19 ENCOUNTER — Telehealth (HOSPITAL_BASED_OUTPATIENT_CLINIC_OR_DEPARTMENT_OTHER): Payer: Self-pay | Admitting: Cardiovascular Disease

## 2024-03-19 NOTE — Telephone Encounter (Signed)
 Result notes provided.   Alver Sorrow, NP

## 2024-03-19 NOTE — Telephone Encounter (Signed)
 Wife Kassie Mends) called to follow-up on patient's lab results.

## 2024-03-19 NOTE — Telephone Encounter (Signed)
 Hey are these results in your basket to be read?

## 2024-03-19 NOTE — Telephone Encounter (Signed)
 Results called to patient who verbalizes understanding!      "CBC with no evidence of anemia nor infection.  Normal BNP - no volume overload. Kidney function slightly decreased from prior. Recommend proceed with lexiscan myoview for evaluation of shortness of breath."

## 2024-03-20 ENCOUNTER — Telehealth: Payer: Self-pay

## 2024-03-20 NOTE — Telephone Encounter (Signed)
 Called pt to let them go on [email or web address] to look at shoes since his on b/o till 4/25

## 2024-04-01 ENCOUNTER — Telehealth (HOSPITAL_BASED_OUTPATIENT_CLINIC_OR_DEPARTMENT_OTHER): Payer: Self-pay | Admitting: Cardiovascular Disease

## 2024-04-01 DIAGNOSIS — R0989 Other specified symptoms and signs involving the circulatory and respiratory systems: Secondary | ICD-10-CM

## 2024-04-01 DIAGNOSIS — I10 Essential (primary) hypertension: Secondary | ICD-10-CM

## 2024-04-01 DIAGNOSIS — N1832 Chronic kidney disease, stage 3b: Secondary | ICD-10-CM

## 2024-04-01 DIAGNOSIS — R0609 Other forms of dyspnea: Secondary | ICD-10-CM

## 2024-04-01 NOTE — Telephone Encounter (Signed)
 Wife (Vertie) called to follow-up on next steps after patient's test results.

## 2024-04-01 NOTE — Telephone Encounter (Signed)
 Consulted with NP, testing still needed to be ordered. Testing ordered, will route as urgent to scheduling team. Returned call to wife, explained the situation and apologized.

## 2024-04-01 NOTE — Telephone Encounter (Signed)
 On labs 03/15/24 recommended lexiscan myoview. We were awaiting labs to determine testing modality. Appears the lexiscan myoview simply  needs to be ordered and scheduled.   Li Fragoso S Vonya Ohalloran, NP

## 2024-04-01 NOTE — Telephone Encounter (Signed)
 Am I crazy... do you see any recent testing or a lexi on this guy?

## 2024-04-04 ENCOUNTER — Other Ambulatory Visit: Payer: Self-pay | Admitting: Internal Medicine

## 2024-04-04 NOTE — Telephone Encounter (Signed)
 Copied from CRM [id number]. Topic: Clinical - Medication Refill >> Apr 04, 2024  1:01 PM [place] wrote: Most Recent Primary Care Visit:  Provider: BURNS, Beckey Bourgeois  Department: [facility]  Visit Type: OFFICE VISIT  Date: 11/17/2023  Medication: dapagliflozin  propanediol (FARXIGA ) 10 MG TABS tablet  Has the patient contacted their pharmacy? Yes (Agent: If no, request that the patient contact the pharmacy for the refill. If patient does not wish to contact the pharmacy document the reason why and proceed with request.) (Agent: If yes, when and what did the pharmacy advise?)  Is this the correct pharmacy for this prescription? Yes If no, delete pharmacy and type the correct one.  This is the patient's preferred pharmacy:  [facility] [address], [place] - [address] AT [address] [place] [address] Phone: [phone] Fax: [phone]   Has the prescription been filled recently? Yes  Is the patient out of the medication? Yes  Has the patient been seen for an appointment in the last year OR does the patient have an upcoming appointment? Yes  Can we respond through MyChart? Yes  Agent: Please be advised that Rx refills may take up to 3 business days. We ask that you follow-up with your pharmacy.

## 2024-04-05 ENCOUNTER — Ambulatory Visit: Payer: Medicare Other | Admitting: Podiatry

## 2024-04-08 ENCOUNTER — Ambulatory Visit: Admitting: Podiatry

## 2024-04-08 DIAGNOSIS — Z7901 Long term (current) use of anticoagulants: Secondary | ICD-10-CM | POA: Diagnosis not present

## 2024-04-08 DIAGNOSIS — M79674 Pain in right toe(s): Secondary | ICD-10-CM

## 2024-04-08 DIAGNOSIS — B351 Tinea unguium: Secondary | ICD-10-CM

## 2024-04-08 DIAGNOSIS — M79675 Pain in left toe(s): Secondary | ICD-10-CM

## 2024-04-08 NOTE — Progress Notes (Signed)
 Subjective: Chief Complaint  Patient presents with   [facility]    [address] [facility]    78 y.o. returns the office today for painful, elongated, thickened toenails which he cannot trim himself.  States the nails grow quite fast causing discomfort.  He is on Eliquis   PCP: Colene Dauphin, MD Last Seen: 11/17/2023 A1c: 6.2 on 11/17/2023  Objective: AAO 3, NAD DP pulse 2/4, PT 1/4 b/l, RT < 3 seconds Nails hypertrophic, dystrophic, elongated, brittle, discolored 10. There is tenderness overlying the nails 1-5 bilaterally. There is no surrounding erythema or drainage along the nail sites. No pain with calf compression, swelling, warmth, erythema.  Assessment: Patient presents with symptomatic onychomycosis  Plan: -Treatment options including alternatives, risks, complications were discussed -Nails sharply debrided 10 without complication/bleeding.   Return in about 3 months (around 07/08/2024).  Bobbie Burows, DPM

## 2024-04-09 NOTE — Telephone Encounter (Signed)
 Spoke to patient and gave him detailed instructions about his STRESS TEST on 04/12/24 at 7:30 and the address to our new location.

## 2024-04-11 ENCOUNTER — Other Ambulatory Visit (HOSPITAL_BASED_OUTPATIENT_CLINIC_OR_DEPARTMENT_OTHER): Payer: Self-pay | Admitting: Family

## 2024-04-11 DIAGNOSIS — R0989 Other specified symptoms and signs involving the circulatory and respiratory systems: Secondary | ICD-10-CM

## 2024-04-11 DIAGNOSIS — I10 Essential (primary) hypertension: Secondary | ICD-10-CM

## 2024-04-11 DIAGNOSIS — R0609 Other forms of dyspnea: Secondary | ICD-10-CM

## 2024-04-11 DIAGNOSIS — N1832 Chronic kidney disease, stage 3b: Secondary | ICD-10-CM

## 2024-04-12 ENCOUNTER — Ambulatory Visit (HOSPITAL_COMMUNITY): Attending: Cardiology

## 2024-04-12 DIAGNOSIS — Z0189 Encounter for other specified special examinations: Secondary | ICD-10-CM | POA: Diagnosis not present

## 2024-04-12 DIAGNOSIS — I10 Essential (primary) hypertension: Secondary | ICD-10-CM | POA: Diagnosis not present

## 2024-04-12 DIAGNOSIS — R0989 Other specified symptoms and signs involving the circulatory and respiratory systems: Secondary | ICD-10-CM | POA: Diagnosis not present

## 2024-04-12 DIAGNOSIS — N1832 Chronic kidney disease, stage 3b: Secondary | ICD-10-CM | POA: Insufficient documentation

## 2024-04-12 DIAGNOSIS — R0609 Other forms of dyspnea: Secondary | ICD-10-CM | POA: Diagnosis not present

## 2024-04-12 MED ORDER — REGADENOSON 0.4 MG/5ML IV SOLN
INTRAVENOUS | Status: AC
  Filled 2024-04-12: qty 5

## 2024-04-12 MED ORDER — TECHNETIUM TC 99M TETROFOSMIN IV KIT
25.1000 | PACK | Freq: Once | INTRAVENOUS | Status: AC | PRN
  Administered 2024-04-12: 25.1 via INTRAVENOUS

## 2024-04-12 MED ORDER — REGADENOSON 0.4 MG/5ML IV SOLN
0.4000 mg | Freq: Once | INTRAVENOUS | Status: AC
  Administered 2024-04-12: 0.4 mg via INTRAVENOUS

## 2024-04-12 MED ORDER — TECHNETIUM TC 99M TETROFOSMIN IV KIT
7.8300 | PACK | Freq: Once | INTRAVENOUS | Status: AC | PRN
Start: 2024-04-12 — End: 2024-04-12
  Administered 2024-04-12: 7.83 via INTRAVENOUS

## 2024-04-14 LAB — MYOCARDIAL PERFUSION IMAGING
LV dias vol: 117 mL (ref 62–150)
LV sys vol: 42 mL
Nuc Stress EF: 64 %
Peak HR: 75 {beats}/min
Rest HR: 60 {beats}/min
Rest Nuclear Isotope Dose: 7.8 mCi
ST Depression (mm): 0 mm
Stress Nuclear Isotope Dose: 25.1 mCi
TID: 1.27

## 2024-04-16 ENCOUNTER — Telehealth: Payer: Self-pay | Admitting: Cardiovascular Disease

## 2024-04-16 ENCOUNTER — Telehealth (HOSPITAL_BASED_OUTPATIENT_CLINIC_OR_DEPARTMENT_OTHER): Payer: Self-pay

## 2024-04-16 MED ORDER — ISOSORBIDE MONONITRATE ER 30 MG PO TB24: 15.0000 mg | ORAL_TABLET | Freq: Every day | ORAL | 3 refills | Status: DC

## 2024-04-16 NOTE — Telephone Encounter (Addendum)
 Results called to patient who verbalizes understanding! Advised pt Alfred Foster will be following up with them!    ----- Message from Clearnce Curia sent at 04/16/2024  1:26 PM EDT ----- Stress test shows normal heart muscle function. There is evidence of mild plaque in right coronary artery. There is a medium defect which is fixed. This means it is the same on stress and rest images. Recommend medical management. Amlodipine  and Metoprolol  both help prevent chest pain, exertional dyspnea related to coronary artery disease.   If still noticing exertional dyspnea, recommend addition Imdur 15mg  (half tablet) every evening for anti-anginal benefit. If not noticing exertional dyspnea, follow up as scheduled.   Provider note only: reviewed with Dr. Maximo Spar as DOD

## 2024-04-16 NOTE — Telephone Encounter (Signed)
 Patient's wife called in requesting to speak with Karmen Pa regarding an encounter with 2nd floor registration team on 5/02. Spouse only wants to speak with [facility] regarding this matter. Spouse states she she will further explain when she speaks with her.

## 2024-04-16 NOTE — Telephone Encounter (Signed)
 Already routed to [facility], removing from triage.

## 2024-04-24 ENCOUNTER — Ambulatory Visit (INDEPENDENT_AMBULATORY_CARE_PROVIDER_SITE_OTHER)

## 2024-04-24 VITALS — Ht 73.0 in | Wt 209.0 lb

## 2024-04-24 DIAGNOSIS — Z0000 Encounter for general adult medical examination without abnormal findings: Secondary | ICD-10-CM | POA: Diagnosis not present

## 2024-04-24 NOTE — Progress Notes (Unsigned)
 Subjective:   Alfred Foster is a 78 y.o. who presents for a Medicare Wellness preventive visit.  As a reminder, Annual Wellness Visits don't include a physical exam, and some assessments may be limited, especially if this visit is performed virtually. We may recommend an in-person visit if needed.  Visit Complete: Virtual I connected with  Vergie Glass on 04/24/24 by a audio enabled telemedicine application and verified that I am speaking with the correct person using two identifiers.  Patient Location: Home  Provider Location: Home Office  I discussed the limitations of evaluation and management by telemedicine. The patient expressed understanding and agreed to proceed.  Vital Signs: Because this visit was a virtual/telehealth visit, some criteria may be missing or patient reported. Any vitals not documented were not able to be obtained and vitals that have been documented are patient reported.  VideoDeclined- This patient declined Librarian, academic. Therefore the visit was completed with audio only.  Persons Participating in Visit: Patient.  AWV Questionnaire: No: Patient Medicare AWV questionnaire was not completed prior to this visit.  Cardiac Risk Factors include: advanced age (>74men, >83 women);hypertension;male gender;diabetes mellitus;dyslipidemia;Other (see comment), Risk factor comments: OSA, CKD     Objective:     Today's Vitals   04/24/24 1548  Weight: 209 lb (94.8 kg)  Height: 6\' 1"  (1.854 m)   Body mass index is 27.57 kg/m.     04/24/2024    3:57 PM 07/14/2023    3:20 PM 07/20/2022    4:08 PM 07/30/2021   10:04 AM 10/16/2020    3:47 PM 12/10/2019    4:55 AM 12/09/2019   10:45 AM  Advanced Directives  Does Patient Have a Medical Advance Directive? Yes Yes Yes No No  No  Type of Estate agent of [place];Living will Healthcare Power of [place];Living will Living will;Healthcare Power of Attorney      Does  patient want to make changes to medical advance directive?   No - Patient declined      Copy of Healthcare Power of Attorney in Chart? No - copy requested No - copy requested No - copy requested      Would patient like information on creating a medical advance directive?    No - Patient declined Yes (MAU/Ambulatory/Procedural Areas - Information given) No - Patient declined     Current Medications (verified) Outpatient Encounter Medications as of 04/24/2024  Medication Sig   amLODipine  (NORVASC ) 10 MG tablet Take 1 tablet (10 mg total) by mouth daily.   apixaban  (ELIQUIS ) 2.5 MG TABS tablet Take 1 tablet (2.5 mg total) by mouth 2 (two) times daily.   apixaban  (ELIQUIS ) 5 MG TABS tablet Take 5 mg by mouth daily at 6 (six) AM.   Ascorbic Acid  (VITAMIN C PO) Take 1 tablet by mouth daily.   Cholecalciferol (VITAMIN D3) 1000 UNITS CAPS Take 1,000 Units by mouth daily.   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   gabapentin  (NEURONTIN ) 100 MG capsule Take 2 capsules (200 mg total) by mouth at bedtime.   glimepiride  (AMARYL ) 1 MG tablet TAKE 1 TABLET(1 MG) BY MOUTH DAILY WITH BREAKFAST   hydrALAZINE  (APRESOLINE ) 100 MG tablet Take 1 tablet (100 mg total) by mouth  3 (three) times daily.   Ipratropium-Albuterol  (COMBIVENT ) 20-100 MCG/ACT AERS respimat Inhale 1 puff into the lungs every 6 (six) hours as needed for wheezing or shortness of breath.   isosorbide  mononitrate (IMDUR ) 30 MG 24 hr tablet Take 0.5 tablets (15 mg total) by mouth daily.   Lancets (ONETOUCH ULTRASOFT) lancets use as directed   losartan  (COZAAR ) 100 MG tablet TAKE 1 TABLET(100 MG) BY MOUTH DAILY   metoprolol  (TOPROL  XL) 200 MG 24 hr tablet Take 1 tablet (200 mg total) by mouth daily.    Omega-3 Fatty Acids (FISH OIL PO) Take 1,000 mg by mouth daily.   ONE TOUCH ULTRA TEST test strip use as directed   rosuvastatin  (CRESTOR ) 20 MG tablet TAKE 1 TABLET(20 MG) BY MOUTH DAILY   spironolactone  (ALDACTONE ) 25 MG tablet TAKE 1/2 TABLET(12.5 MG) BY MOUTH DAILY   No facility-administered encounter medications on file as of 04/24/2024.    Allergies (verified) Hydrochlorothiazide  and Metformin  and related   History: Past Medical History:  Diagnosis Date   Diabetes mellitus    Hyperlipidemia    Hypertension    OSA on CPAP 11/10/2022   Prostate cancer (HCC) 12/12/2005   Dr Rozanne Corners   Snoring 11/10/2022   Past Surgical History:  Procedure Laterality Date   COLONOSCOPY  2004 & 2014   Negative ;[facility]   EYE SURGERY  09/06/2022   Cataract surgery   INGUINAL HERNIA REPAIR     PROSTATECTOMY  04/11/2006   Dr Rozanne Corners   UMBILICAL HERNIA REPAIR     Family History  Problem Relation Age of Onset   Hypertension Mother    Alzheimer's disease Mother    Heart attack Mother 71   Diabetes Father    Hypertension Father    Alzheimer's disease Father    Heart attack Sister 3   Heart attack Brother    Diabetes Brother    Heart disease Brother    Heart attack Maternal Aunt        in 35s   Stroke Neg Hx    Colon cancer Neg Hx    Esophageal cancer Neg Hx    Stomach cancer Neg Hx    Social History   Socioeconomic History   Marital status: Married    Spouse name: Vertie   Number of children: 1   Years of education: Not on file   Highest education level: Not on file  Occupational History   Occupation: Full Scientist, research (medical)  Tobacco Use   Smoking status: Never   Smokeless tobacco: Never  Vaping Use   Vaping status: Never Used  Substance and Sexual Activity   Alcohol use: No   Drug use: No   Sexual activity: Not on file  Other Topics Concern   Not on file  Social History Narrative   Lives with wife/2025   Social Drivers of Health   Financial Resource Strain: Low Risk   (04/24/2024)   Overall Financial Resource Strain (CARDIA)    Difficulty of Paying Living Expenses: Not hard at all  Food Insecurity: No Food Insecurity (04/24/2024)   Hunger Vital Sign    Worried About Running Out of Food in the Last Year: Never true    Ran Out of Food in the Last Year: Never true  Transportation Needs: No Transportation Needs (04/24/2024)   PRAPARE - Administrator, Civil Service (Medical): No    Lack of Transportation (Non-Medical): No  Physical Activity: Sufficiently Active (04/24/2024)   Exercise Vital Sign    Days  of Exercise per Week: 5 days    Minutes of Exercise per Session: 40 min  Stress: No Stress Concern Present (04/24/2024)   [employer] of Occupational Health - Occupational Stress Questionnaire    Feeling of Stress : Not at all  Social Connections: Moderately Isolated (04/24/2024)   Social Connection and Isolation Panel [NHANES]    Frequency of Communication with Friends and Family: Twice a week    Frequency of Social Gatherings with Friends and Family: Never    Attends Religious Services: More than 4 times per year    Active Member of [employer] or Organizations: No    Attends Engineer, structural: Never    Marital Status: Married    Tobacco Counseling Counseling given: Not Answered    Clinical Intake:  Pre-visit preparation completed: Yes  Pain : No/denies pain     BMI - recorded: 27.57 Nutritional Status: BMI 25 -29 Overweight Nutritional Risks: None Diabetes: Yes CBG done?: No Did pt. bring in CBG monitor from home?: No  Lab Results  Component Value Date   HGBA1C 6.2 11/17/2023   HGBA1C 6.2 05/12/2023   HGBA1C 6.7 (H) 11/08/2022     How often do you need to have someone help you when you read instructions, pamphlets, or other written materials from your doctor or pharmacy?: 1 - Never  Interpreter Needed?: No  Information entered by :: Harlow Carrizales, RMA   Activities of Daily Living     04/24/2024    3:48  PM 07/14/2023    3:17 PM  In your present state of health, do you have any difficulty performing the following activities:  Hearing? 0 0  Vision? 0 0  Difficulty concentrating or making decisions? 0 0  Walking or climbing stairs? 0 0  Dressing or bathing? 0 0  Doing errands, shopping? 0 1  Preparing Food and eating ? N N  Using the Toilet? N N  In the past six months, have you accidently leaked urine? N N  Do you have problems with loss of bowel control? N N  Managing your Medications? N N  Managing your Finances? N N  Housekeeping or managing your Housekeeping? N N    Patient Care Team: Colene Dauphin, MD as PCP - General (Internal Medicine) Maudine Sos, MD as PCP - Cardiology (Cardiology) Jonathan Neighbor, [facility] (Inactive) as Pharmacist (Pharmacist) Alvina Axon, MD as Consulting Physician (Ophthalmology)  Indicate any recent Medical Services you may have received from other than [facility] providers in the past year (date may be approximate).     Assessment:    This is a routine wellness examination for Alfred Foster.  Hearing/Vision screen Hearing Screening - Comments:: Denies hearing difficulties   Vision Screening - Comments:: Wears eyeglasses   Goals Addressed             This Visit's Progress    maintain current health status   On track    Continue to eat healthy and exercise, enjoy life and family.       Depression Screen     04/24/2024    3:59 PM 07/14/2023    3:24 PM 11/08/2022   10:03 AM 07/20/2022    4:07 PM 10/09/2020    8:29 AM 10/04/2019    8:06 AM 09/19/2018    8:14 AM  PHQ 2/9 Scores  PHQ - 2 Score 0 0 0 0 0 0 0  PHQ- 9 Score 0 1 0  0  0    Fall Risk  04/24/2024    3:57 PM 07/14/2023    3:22 PM 11/08/2022   10:03 AM 07/20/2022    4:11 PM 11/26/2021    1:05 PM  Fall Risk   Falls in the past year? 0 0 0 0 0  Number falls in past yr: 0 0 0 0 0  Injury with Fall? 0 0 0 0 0  Risk for fall due to :  No Fall Risks No Fall Risks No Fall Risks No  Fall Risks  Follow up Falls prevention discussed;Falls evaluation completed Falls prevention discussed Falls evaluation completed Falls evaluation completed Falls evaluation completed    MEDICARE RISK AT HOME:  Medicare Risk at Home Any stairs in or around the home?: Yes If so, are there any without handrails?: Yes Home free of loose throw rugs in walkways, pet beds, electrical cords, etc?: Yes Adequate lighting in your home to reduce risk of falls?: Yes Life alert?: No Use of a cane, walker or w/c?: No Grab bars in the bathroom?: No Shower chair or bench in shower?: Yes Elevated toilet seat or a handicapped toilet?: Yes  TIMED UP AND GO:  Was the test performed?  No  Cognitive Function: Declined/Normal: No cognitive concerns noted by patient or family. Patient alert, oriented, able to answer questions appropriately and recall recent events. No signs of memory loss or confusion.        07/14/2023    3:47 PM 07/20/2022    4:14 PM  6CIT Screen  What Year? 0 points 0 points  What month? 0 points 0 points  What time? 0 points 0 points  Count back from 20 0 points 0 points  Months in reverse 0 points 0 points  Repeat phrase 0 points 0 points  Total Score 0 points 0 points    Immunizations Immunization History  Administered Date(s) Administered   Fluad Quad(high Dose 65+) 09/06/2019, 09/26/2020, 08/27/2021, 09/16/2022   Fluad Trivalent(High Dose 65+) 09/21/2023   Influenza Split 10/12/2011, 08/24/2012   Influenza Whole 09/25/2007, 09/30/2008, 09/09/2009, 09/07/2010   Influenza, High Dose Seasonal PF 09/03/2014, 08/18/2016, 09/15/2017, 09/19/2018   Influenza,inj,Quad PF,6+ Mos 09/17/2013, 09/07/2015   PFIZER Comirnaty(Gray Top)Covid-19 Tri-Sucrose Vaccine 09/23/2022   PFIZER(Purple Top)SARS-COV-2 Vaccination 03/13/2020, 04/06/2020, 09/22/2020   Pfizer Covid-19 Vaccine Bivalent Booster 19yrs & up 09/17/2021   Pneumococcal Conjugate-13 10/28/2014   Pneumococcal Polysaccharide-23  11/12/2012   Respiratory Syncytial Virus Vaccine,Recomb Aduvanted(Arexvy) 11/15/2023   Td 04/21/2010   Tdap 05/16/2023   Zoster Recombinant(Shingrix) 06/09/2017, 08/15/2017   Zoster, Live 11/23/2012    Screening Tests Health Maintenance  Topic Date Due   OPHTHALMOLOGY EXAM  07/15/2023   HEMOGLOBIN A1C  05/17/2024   INFLUENZA VACCINE  07/12/2024   Medicare Annual Wellness (AWV)  07/13/2024   FOOT EXAM  10/01/2024   Diabetic kidney evaluation - Urine ACR  02/28/2025   Diabetic kidney evaluation - eGFR measurement  03/15/2025   DTaP/Tdap/Td (3 - Td or Tdap) 05/15/2033   Pneumonia Vaccine 75+ Years old  Completed   Hepatitis C Screening  Completed   Zoster Vaccines- Shingrix  Completed   HPV VACCINES  Aged Out   Meningococcal B Vaccine  Aged Out   Colonoscopy  Discontinued   COVID-19 Vaccine  Discontinued    Health Maintenance  Health Maintenance Due  Topic Date Due   OPHTHALMOLOGY EXAM  07/15/2023   Health Maintenance Items Addressed: See Nurse Notes  Additional Screening:  Vision Screening: Recommended annual ophthalmology exams for early detection of glaucoma and other disorders of the  eye.  Dental Screening: Recommended annual dental exams for proper oral hygiene  Community Resource Referral / Chronic Care Management: CRR required this visit?  No   CCM required this visit?  No   Plan:    I have personally reviewed and noted the following in the patient's chart:   Medical and social history Use of alcohol, tobacco or illicit drugs  Current medications and supplements including opioid prescriptions. Patient is not currently taking opioid prescriptions. Functional ability and status Nutritional status Physical activity Advanced directives List of other physicians Hospitalizations, surgeries, and ER visits in previous 12 months Vitals Screenings to include cognitive, depression, and falls Referrals and appointments  In addition, I have reviewed and  discussed with patient certain preventive protocols, quality metrics, and best practice recommendations. A written personalized care plan for preventive services as well as general preventive health recommendations were provided to patient.   Kasey Ewings L Eira Alpert, CMA   04/24/2024   After Visit Summary: (Mail) Due to this being a telephonic visit, the after visit summary with patients personalized plan was offered to patient via mail   Notes: Nothing significant to report at this time.

## 2024-04-24 NOTE — Patient Instructions (Signed)
 Alfred Foster , Thank you for taking time out of your busy schedule to complete your Annual Wellness Visit with me. I enjoyed our conversation and look forward to speaking with you again next year. I, as well as your care team,  appreciate your ongoing commitment to your health goals. Please review the following plan we discussed and let me know if I can assist you in the future. Your Game plan/ To Do List     Follow up Visits: Next Medicare AWV with our clinical staff: 04/25/2025.   Have you seen your provider in the last 6 months (3 months if uncontrolled diabetes)? Yes Next Office Visit with your provider: 05/17/2024.  Clinician Recommendations:  Aim for 30 minutes of exercise or brisk walking, 6-8 glasses of water, and 5 servings of fruits and vegetables each day. Keep up the good work.      This is a list of the screening recommended for you and due dates:  Health Maintenance  Topic Date Due   Eye exam for diabetics  07/15/2023   Hemoglobin A1C  05/17/2024   Flu Shot  07/12/2024   Medicare Annual Wellness Visit  07/13/2024   Complete foot exam   10/01/2024   Yearly kidney health urinalysis for diabetes  02/28/2025   Yearly kidney function blood test for diabetes  03/15/2025   DTaP/Tdap/Td vaccine (3 - Td or Tdap) 05/15/2033   Pneumonia Vaccine  Completed   Hepatitis C Screening  Completed   Zoster (Shingles) Vaccine  Completed   HPV Vaccine  Aged Out   Meningitis B Vaccine  Aged Out   Colon Cancer Screening  Discontinued   COVID-19 Vaccine  Discontinued    Advanced directives: (Copy Requested) Please bring a copy of your health care power of attorney and living will to the office to be added to your chart at your convenience. You can mail to [facility] [address] [place], [place] [address] or email to [email or web address] Advance Care Planning is important because it:  [x]  Makes sure you receive the medical care that is consistent with your values,  goals, and preferences  [x]  It provides guidance to your family and loved ones and reduces their decisional burden about whether or not they are making the right decisions based on your wishes.  Follow the link provided in your after visit summary or read over the paperwork we have mailed to you to help you started getting your Advance Directives in place. If you need assistance in completing these, please reach out to us  so that we can help you!  See attachments for Preventive Care and Fall Prevention Tips.

## 2024-04-26 ENCOUNTER — Telehealth: Payer: Self-pay

## 2024-04-26 ENCOUNTER — Other Ambulatory Visit (HOSPITAL_BASED_OUTPATIENT_CLINIC_OR_DEPARTMENT_OTHER): Payer: Self-pay | Admitting: Cardiovascular Disease

## 2024-04-26 NOTE — Telephone Encounter (Signed)
 Called pt to schedule DM shoe PU. His wife, Vertie, answered. She said they need an appointment at [phone] due to Dwights work schedule and said that you were able to accommodate him last time. She asks that you call him back to schedule. Thank you.

## 2024-04-26 NOTE — Telephone Encounter (Signed)
 Diabetic shoes are here/ paperwork expires 05/17/24

## 2024-05-07 ENCOUNTER — Telehealth: Payer: Self-pay

## 2024-05-07 NOTE — Telephone Encounter (Signed)
 Appt set for Delivery of shoes 5/28 4pm

## 2024-05-08 ENCOUNTER — Other Ambulatory Visit: Payer: Self-pay | Admitting: Internal Medicine

## 2024-05-08 ENCOUNTER — Ambulatory Visit (INDEPENDENT_AMBULATORY_CARE_PROVIDER_SITE_OTHER)

## 2024-05-08 DIAGNOSIS — M2141 Flat foot [pes planus] (acquired), right foot: Secondary | ICD-10-CM

## 2024-05-08 DIAGNOSIS — M2142 Flat foot [pes planus] (acquired), left foot: Secondary | ICD-10-CM | POA: Diagnosis not present

## 2024-05-08 DIAGNOSIS — E119 Type 2 diabetes mellitus without complications: Secondary | ICD-10-CM

## 2024-05-08 DIAGNOSIS — L84 Corns and callosities: Secondary | ICD-10-CM

## 2024-05-08 DIAGNOSIS — M2041 Other hammer toe(s) (acquired), right foot: Secondary | ICD-10-CM

## 2024-05-08 DIAGNOSIS — M2042 Other hammer toe(s) (acquired), left foot: Secondary | ICD-10-CM | POA: Diagnosis not present

## 2024-05-09 NOTE — Progress Notes (Signed)

## 2024-05-16 ENCOUNTER — Encounter: Payer: Self-pay | Admitting: Internal Medicine

## 2024-05-16 NOTE — Progress Notes (Unsigned)
 Subjective:    Patient ID: Alfred Foster, male    DOB: 1946-09-06, 78 y.o.   MRN: [id number]     HPI Alfred Foster is here for follow up of his chronic medical problems.  BP at home has been well controlled.  Not using cpap-he does not feel he needs it.  Medications and allergies reviewed with patient and updated if appropriate.  Current Outpatient Medications on File Prior to Visit  Medication Sig Dispense Refill   apixaban  (ELIQUIS ) 2.5 MG TABS tablet Take 1 tablet (2.5 mg total) by mouth 2 (two) times daily.     Cholecalciferol (VITAMIN D3) 1000 UNITS CAPS Take 1,000 Units by mouth daily.     dapagliflozin  propanediol (FARXIGA ) 10 MG TABS tablet TAKE 1 TABLET(10 MG) BY MOUTH DAILY BEFORE BREAKFAST 90 tablet 1   Elastic Bandages & Supports (MEDICAL COMPRESSION SOCKS) MISC Use daily for leg swelling from DVT.  Dx: X32.440 2 each 0   gabapentin  (NEURONTIN ) 100 MG capsule Take 2 capsules (200 mg total) by mouth at bedtime. 60 capsule 5   glimepiride  (AMARYL ) 1 MG tablet TAKE 1 TABLET(1 MG) BY MOUTH DAILY WITH BREAKFAST 90 tablet 1   hydrALAZINE  (APRESOLINE ) 100 MG tablet TAKE 1 TABLET(100 MG) BY MOUTH THREE TIMES DAILY 270 tablet 1   isosorbide  mononitrate (IMDUR ) 30 MG 24 hr tablet Take 0.5 tablets (15 mg total) by mouth daily. 45 tablet 3   Lancets (ONETOUCH ULTRASOFT) lancets use as directed 100 each 1   losartan  (COZAAR ) 100 MG tablet TAKE 1 TABLET(100 MG) BY MOUTH DAILY 90 tablet 3   Omega-3 Fatty Acids (FISH OIL PO) Take 1,000 mg by mouth daily.     ONE TOUCH ULTRA TEST test strip use as directed 100 each 1   rosuvastatin  (CRESTOR ) 20 MG tablet TAKE 1 TABLET(20 MG) BY MOUTH DAILY 90 tablet 3   amLODipine  (NORVASC ) 10 MG tablet Take 1 tablet (10 mg total) by mouth daily. (Patient not taking: Reported on 05/17/2024) 90 tablet 3   Ipratropium-Albuterol  (COMBIVENT ) 20-100 MCG/ACT AERS respimat Inhale 1 puff into the lungs every 6 (six) hours as needed for wheezing or shortness of  breath. (Patient not taking: Reported on 05/17/2024) 4 g 0   metoprolol  (TOPROL  XL) 200 MG 24 hr tablet Take 1 tablet (200 mg total) by mouth daily. (Patient not taking: Reported on 05/17/2024) 90 tablet 3   spironolactone  (ALDACTONE ) 25 MG tablet TAKE 1/2 TABLET(12.5 MG) BY MOUTH DAILY (Patient not taking: Reported on 05/17/2024) 30 tablet 5   No current facility-administered medications on file prior to visit.     Review of Systems  Constitutional:  Positive for fatigue. Negative for fever.  Respiratory:  Positive for shortness of breath (with bending over). Negative for cough and wheezing.   Cardiovascular:  Negative for chest pain, palpitations and leg swelling.  Gastrointestinal:        No gerd  Neurological:  Negative for light-headedness and headaches.       Objective:   Vitals:   05/17/24 1008  BP: 136/70  Pulse: 80  Temp: 98.3 F (36.8 C)  SpO2: 95%   BP Readings from Last 3 Encounters:  05/17/24 136/70  03/15/24 110/60  11/17/23 138/84   Wt Readings from Last 3 Encounters:  05/17/24 209 lb (94.8 kg)  04/24/24 209 lb (94.8 kg)  04/12/24 209 lb (94.8 kg)   Body mass index is 27.57 kg/m.    Physical Exam Constitutional:      General:  He is not in acute distress.    Appearance: Normal appearance. He is not ill-appearing.  HENT:     Head: Normocephalic and atraumatic.  Eyes:     Conjunctiva/sclera: Conjunctivae normal.  Cardiovascular:     Rate and Rhythm: Normal rate. Rhythm irregular.     Heart sounds: Normal heart sounds.  Pulmonary:     Effort: Pulmonary effort is normal. No respiratory distress.     Breath sounds: Normal breath sounds. No wheezing or rales.  Musculoskeletal:     Right lower leg: No edema.     Left lower leg: No edema.  Skin:    General: Skin is warm and dry.     Findings: No rash.  Neurological:     Mental Status: He is alert. Mental status is at baseline.  Psychiatric:        Mood and Affect: Mood normal.        Lab Results   Component Value Date   WBC 6.0 03/15/2024   HGB 13.2 03/15/2024   HCT 40.0 03/15/2024   PLT 226 03/15/2024   GLUCOSE 106 (H) 03/15/2024   CHOL 129 11/17/2023   TRIG 83.0 11/17/2023   HDL 39.40 11/17/2023   LDLCALC 73 11/17/2023   ALT 21 11/17/2023   AST 25 11/17/2023   NA 140 03/15/2024   K 4.8 03/15/2024   CL 107 (H) 03/15/2024   CREATININE 1.88 (H) 03/15/2024   BUN 27 03/15/2024   CO2 19 (L) 03/15/2024   TSH 0.815 11/17/2022   PSA 0.00 (L) 05/12/2023   INR 1.01 12/14/2012   HGBA1C 6.2 11/17/2023   MICROALBUR 1.1 05/12/2023     Assessment & Plan:    See Problem List for Assessment and Plan of chronic medical problems.

## 2024-05-16 NOTE — Patient Instructions (Addendum)
      Blood work was ordered.       Medications changes include :   None    A referral was ordered and someone will call you to schedule an appointment.     Return in about 6 months (around 11/16/2024) for Physical Exam.

## 2024-05-17 ENCOUNTER — Telehealth: Payer: Self-pay | Admitting: Internal Medicine

## 2024-05-17 ENCOUNTER — Ambulatory Visit: Payer: Medicare Other | Admitting: Internal Medicine

## 2024-05-17 ENCOUNTER — Other Ambulatory Visit (INDEPENDENT_AMBULATORY_CARE_PROVIDER_SITE_OTHER)

## 2024-05-17 VITALS — BP 136/70 | HR 80 | Temp 98.3°F | Ht 73.0 in | Wt 209.0 lb

## 2024-05-17 DIAGNOSIS — Z86718 Personal history of other venous thrombosis and embolism: Secondary | ICD-10-CM

## 2024-05-17 DIAGNOSIS — N1832 Chronic kidney disease, stage 3b: Secondary | ICD-10-CM | POA: Diagnosis not present

## 2024-05-17 DIAGNOSIS — Z7984 Long term (current) use of oral hypoglycemic drugs: Secondary | ICD-10-CM

## 2024-05-17 DIAGNOSIS — M48062 Spinal stenosis, lumbar region with neurogenic claudication: Secondary | ICD-10-CM

## 2024-05-17 DIAGNOSIS — E782 Mixed hyperlipidemia: Secondary | ICD-10-CM

## 2024-05-17 DIAGNOSIS — I10 Essential (primary) hypertension: Secondary | ICD-10-CM

## 2024-05-17 DIAGNOSIS — I4891 Unspecified atrial fibrillation: Secondary | ICD-10-CM | POA: Diagnosis not present

## 2024-05-17 DIAGNOSIS — Z125 Encounter for screening for malignant neoplasm of prostate: Secondary | ICD-10-CM | POA: Diagnosis not present

## 2024-05-17 DIAGNOSIS — G4733 Obstructive sleep apnea (adult) (pediatric): Secondary | ICD-10-CM

## 2024-05-17 DIAGNOSIS — E1142 Type 2 diabetes mellitus with diabetic polyneuropathy: Secondary | ICD-10-CM

## 2024-05-17 DIAGNOSIS — Z23 Encounter for immunization: Secondary | ICD-10-CM | POA: Diagnosis not present

## 2024-05-17 DIAGNOSIS — I499 Cardiac arrhythmia, unspecified: Secondary | ICD-10-CM | POA: Diagnosis not present

## 2024-05-17 DIAGNOSIS — I480 Paroxysmal atrial fibrillation: Secondary | ICD-10-CM | POA: Insufficient documentation

## 2024-05-17 LAB — HEMOGLOBIN A1C: Hgb A1c MFr Bld: 6.3 % (ref 4.6–6.5)

## 2024-05-17 LAB — CBC WITH DIFFERENTIAL/PLATELET
Basophils Absolute: 0 10*3/uL (ref 0.0–0.1)
Basophils Relative: 0.6 % (ref 0.0–3.0)
Eosinophils Absolute: 0.2 10*3/uL (ref 0.0–0.7)
Eosinophils Relative: 3.3 % (ref 0.0–5.0)
HCT: 39.8 % (ref 39.0–52.0)
Hemoglobin: 13.1 g/dL (ref 13.0–17.0)
Lymphocytes Relative: 26.7 % (ref 12.0–46.0)
Lymphs Abs: 1.7 10*3/uL (ref 0.7–4.0)
MCHC: 33.1 g/dL (ref 30.0–36.0)
MCV: 94.8 fl (ref 78.0–100.0)
Monocytes Absolute: 0.7 10*3/uL (ref 0.1–1.0)
Monocytes Relative: 11.1 % (ref 3.0–12.0)
Neutro Abs: 3.7 10*3/uL (ref 1.4–7.7)
Neutrophils Relative %: 58.3 % (ref 43.0–77.0)
Platelets: 244 10*3/uL (ref 150.0–400.0)
RBC: 4.19 Mil/uL — ABNORMAL LOW (ref 4.22–5.81)
RDW: 13.8 % (ref 11.5–15.5)
WBC: 6.4 10*3/uL (ref 4.0–10.5)

## 2024-05-17 LAB — COMPREHENSIVE METABOLIC PANEL WITH GFR
ALT: 20 U/L (ref 0–53)
AST: 23 U/L (ref 0–37)
Albumin: 4.3 g/dL (ref 3.5–5.2)
Alkaline Phosphatase: 81 U/L (ref 39–117)
BUN: 27 mg/dL — ABNORMAL HIGH (ref 6–23)
CO2: 24 meq/L (ref 19–32)
Calcium: 9.3 mg/dL (ref 8.4–10.5)
Chloride: 109 meq/L (ref 96–112)
Creatinine, Ser: 1.83 mg/dL — ABNORMAL HIGH (ref 0.40–1.50)
GFR: 35.32 mL/min — ABNORMAL LOW (ref >60.00)
Glucose, Bld: 85 mg/dL (ref 70–99)
Potassium: 4.2 meq/L (ref 3.5–5.1)
Sodium: 140 meq/L (ref 135–145)
Total Bilirubin: 0.5 mg/dL (ref 0.2–1.2)
Total Protein: 6.7 g/dL (ref 6.0–8.3)

## 2024-05-17 LAB — LIPID PANEL
Cholesterol: 117 mg/dL (ref 0–200)
HDL: 41.7 mg/dL (ref >39.00)
LDL Cholesterol: 60 mg/dL (ref 0–99)
NonHDL: 75.65
Total CHOL/HDL Ratio: 3
Triglycerides: 78 mg/dL (ref 0.0–149.0)
VLDL: 15.6 mg/dL (ref 0.0–40.0)

## 2024-05-17 MED ORDER — METOPROLOL TARTRATE 25 MG PO TABS: 25.0000 mg | ORAL_TABLET | Freq: Two times a day (BID) | ORAL | 3 refills | Status: AC

## 2024-05-17 MED ORDER — APIXABAN 5 MG PO TABS: 5.0000 mg | ORAL_TABLET | Freq: Two times a day (BID) | ORAL | 5 refills | Status: DC

## 2024-05-17 NOTE — Assessment & Plan Note (Signed)
 New Irregular heartbeat on exam EKG today confirmed atrial fibrillation with RVR 124 bpm.  Atrial fibrillation is new compared to previous EKGs. Increase Eliquis  to 5 mg twice daily Start metoprolol  25 mg twice daily Discontinue Imdur  so that blood pressure does not drop too much Monitor BP and HR at home F/u with me in 2 weeks

## 2024-05-17 NOTE — Telephone Encounter (Signed)
 Spoke with patient' spouse today.  She will call back to set up his 2 week follow up appointment.

## 2024-05-17 NOTE — Assessment & Plan Note (Signed)
Chronic Regular exercise and healthy diet encouraged Check lipid panel today Continue Crestor 20 mg daily

## 2024-05-17 NOTE — Assessment & Plan Note (Signed)
 Chronic Not using CPAP Discussed that he was diagnosed with severe sleep apnea and discussed what that means and the possible consequences of untreated sleep apnea He agrees to start using his CPAP machine on a nightly basis

## 2024-05-17 NOTE — Telephone Encounter (Signed)
 Spoke with wife today and info given regarding medication change.  She will call back after she leaves appointment to get the medication he is to no longer take.  If she calls back okay to have her have patient stop the isosorbide  mononitrate.   She has been made aware of other 2 scripts being sent into pharmacy for pick up.

## 2024-05-17 NOTE — Addendum Note (Signed)
 Addended by: Katherene Pals on: 05/17/2024 01:12 PM   Modules accepted: Orders

## 2024-05-17 NOTE — Assessment & Plan Note (Signed)
 Chronic [facility] Symptoms controlled  occ pain in left leg Continue gabapentin  200 mg nightly as needed

## 2024-05-17 NOTE — Assessment & Plan Note (Signed)
Chronic Stressed good blood pressure, sugar control Encouraged weight loss No NSAIDs Continue increased water intake CBC, CMP

## 2024-05-17 NOTE — Assessment & Plan Note (Addendum)
Chronic Continue gabapentin 200 mg nightly

## 2024-05-17 NOTE — Assessment & Plan Note (Signed)
 Chronic  Lab Results  Component Value Date   HGBA1C 6.2 11/17/2023   Sugars controlled Testing sugars 1 times a day Check A1c  Continue glimepiride  1 mg daily, Farxiga  10 mg daily Stressed regular exercise, diabetic diet

## 2024-05-17 NOTE — Addendum Note (Signed)
 Addended by: Colene Dauphin on: 05/17/2024 12:37 PM   Modules accepted: Level of Service

## 2024-05-17 NOTE — Telephone Encounter (Signed)
 EKG does show atrial fibrillation.  We need to make a couple of medication changes  Increase Eliquis  to 5 mg twice daily  Start metoprolol  25 mg twice daily.  Stop isosorbide  mononitrate  Monitor blood pressure and heart rate at least once a day.  Follow-up with me in 2 weeks

## 2024-05-17 NOTE — Telephone Encounter (Signed)
 Copied from CRM [id number]. Topic: General - Other >> May 17, 2024  3:08 PM Howard Macho wrote: Reason for CRM: patient wife called stating she need to confirm the patient medication and how much he should be taking. The patient wife stated the apixaban  (ELIQUIS ) 5 MG TABS tablet has been increased to two times daily when he was taking one a day, metoprolol  tartrate (LOPRESSOR ) 25 MG tablet  he was taking one time a day has been increased to two times a day and it was one she told him not to take and she cannot remember the one  CB [phone]

## 2024-05-17 NOTE — Assessment & Plan Note (Signed)
 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

## 2024-05-17 NOTE — Assessment & Plan Note (Addendum)
 Chronic Blood pressure fairly controlled CMP Reviewing the medication list in detail revealed that he is not currently taking amlodipine , metoprolol  or spironolactone . Currently taking isosorbide  mononitrate 15 mg daily, losartan  100 mg daily, hydralazine  to 100 mg 3 times daily Initially was going to continue these medications since he is tolerating them well and blood pressure is well-controlled, but with new onset atrial fibrillation we will start metoprolol  25 mg twice daily and stop isosorbide  mononitrate the blood pressure does not go too low Monitor BP and heart rate at home Follow-up with me in 2 weeks

## 2024-05-23 ENCOUNTER — Ambulatory Visit: Payer: Self-pay | Admitting: Internal Medicine

## 2024-05-23 LAB — PSA, MEDICARE: PSA: 0 ng/mL — ABNORMAL LOW (ref 0.10–4.00)

## 2024-06-06 ENCOUNTER — Telehealth: Payer: Self-pay | Admitting: Internal Medicine

## 2024-06-06 DIAGNOSIS — Z0184 Encounter for antibody response examination: Secondary | ICD-10-CM

## 2024-06-06 NOTE — Telephone Encounter (Signed)
 We can check for immunity -- blood work ordered

## 2024-06-06 NOTE — Telephone Encounter (Signed)
 Copied from CRM [id number]. Topic: Appointments - Scheduling Inquiry for Clinic >> Jun 06, 2024  9:51 AM Martinique E wrote: Reason for CRM: Patient's wife, Vertie, called in questioning if patient is due for the Measles vaccine for his age. Callback number [phone].

## 2024-06-07 ENCOUNTER — Ambulatory Visit: Admitting: Internal Medicine

## 2024-06-07 NOTE — Telephone Encounter (Signed)
Spoke with patient's spouse today 

## 2024-06-10 ENCOUNTER — Ambulatory Visit (HOSPITAL_BASED_OUTPATIENT_CLINIC_OR_DEPARTMENT_OTHER): Admitting: Nurse Practitioner

## 2024-06-11 ENCOUNTER — Other Ambulatory Visit

## 2024-06-11 DIAGNOSIS — Z0184 Encounter for antibody response examination: Secondary | ICD-10-CM

## 2024-06-12 LAB — MEASLES/MUMPS/RUBELLA IMMUNITY
Mumps IgG: >300 [AU]/ml
Rubella: 25.8 {index}
Rubeola IgG: >300 [AU]/ml

## 2024-06-16 ENCOUNTER — Ambulatory Visit: Payer: Self-pay | Admitting: Internal Medicine

## 2024-06-17 NOTE — Telephone Encounter (Signed)
 Copied from CRM [id number]. Topic: Clinical - Lab/Test Results >> Jun 17, 2024 10:27 AM Donna BRAVO wrote: Reason for CRM:  patient wife Vertie calling asking for lab result drawn on 06/11/24   patient calling asking for lab result drawn on 06/11/24  Read verbatim lab result  Glade JINNY Hope, MD 06/16/2024  2:49 PM EDT Back to Top  He is immune to measles, mumps and rubella  Vertie  would like to know if he needs these injections  Patient [phone]

## 2024-06-22 ENCOUNTER — Other Ambulatory Visit: Payer: Self-pay | Admitting: Internal Medicine

## 2024-06-24 ENCOUNTER — Ambulatory Visit (INDEPENDENT_AMBULATORY_CARE_PROVIDER_SITE_OTHER): Admitting: Family

## 2024-06-24 ENCOUNTER — Encounter (HOSPITAL_BASED_OUTPATIENT_CLINIC_OR_DEPARTMENT_OTHER): Payer: Self-pay | Admitting: Family

## 2024-06-24 VITALS — BP 118/58 | HR 82 | Ht 73.0 in | Wt 215.4 lb

## 2024-06-24 DIAGNOSIS — I701 Atherosclerosis of renal artery: Secondary | ICD-10-CM

## 2024-06-24 DIAGNOSIS — I10 Essential (primary) hypertension: Secondary | ICD-10-CM | POA: Diagnosis not present

## 2024-06-24 DIAGNOSIS — G4733 Obstructive sleep apnea (adult) (pediatric): Secondary | ICD-10-CM

## 2024-06-24 DIAGNOSIS — I6523 Occlusion and stenosis of bilateral carotid arteries: Secondary | ICD-10-CM

## 2024-06-24 DIAGNOSIS — E785 Hyperlipidemia, unspecified: Secondary | ICD-10-CM | POA: Diagnosis not present

## 2024-06-24 DIAGNOSIS — D6859 Other primary thrombophilia: Secondary | ICD-10-CM | POA: Diagnosis not present

## 2024-06-24 DIAGNOSIS — I480 Paroxysmal atrial fibrillation: Secondary | ICD-10-CM

## 2024-06-24 NOTE — Progress Notes (Signed)
 Advanced [facility]pertension Clinic Assessment:    Date:  06/24/2024   ID:  Alfred Foster, DOB [birth year], MRN [id number]  PCP:  Geofm Glade PARAS, MD  Cardiologist:  Annabella Scarce, MD  Nephrologist:  Referring MD: Geofm Glade PARAS, MD   CC: Hypertension  History of Present Illness:    Alfred Foster is a 78 y.o. male with a hx of carotid stenosis, DVT, CKDIII, HTN, HLD, PAF, OSA, renal artery stenosis, DM2, prostate cancer here to follow up in the [facility].   Saw Dr. Lavona 12/2021 for shortness of breath. ETT frequent PVC and up to 7 beats of NSVT with no ischemia. Carotid duplex mild stenosis bilaterally. Metoprolol  increased. Hydralazine  later increased by PCP.   Established with Dr. Scarce and [facility] HTN clinic 11/10/22. He noted labile blood pressure and sciatic nerve pain. 24h blood pressure monitor was ordered. Renal artery dopplers with left 1-59% stenosis. Renin/aldosterone and TSH were normal. Due to exertional dyspnea, echocardiogram ordered which revealed normal LVEF, no significant valvular abnormalities. Sleep study revealed severe OSA and CPAP initiated.  At visit 03/15/2024 he noted worsening DOE for 2 months.  Myoview  04/12/2024 LVEF 64%, mild plaque in RCA with medium fixed defect recommended for medical management.  Seen by PCP 05/17/24 noted to be in new onset atrial fibrillation as well as self-adjusting some of his medications. He was recommended to remain off/discontinue renal acting, amlodipine , Imdur .  He was recommended to start metoprolol  tartrate 25 mg twice daily.His Eliquis  was increased to 5mg  BID.   He presents today with his wife. 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 He notes exertional dyspnea is improved. He does get mildly short of breath when bending over. Reviewed myoview . Reports no chest pain, pressure, or tightness. No edema, orthopnea, PND. Reports no palpitations.  Notes wearing CPAP  intermittently, reviewed importance.  Previous antihypertensives: HCTZ - shortness of breath, dizziness Spironolactone  Amlodipine   Past Medical History:  Diagnosis Date   Diabetes mellitus    Hyperlipidemia    Hypertension    OSA on CPAP 11/10/2022   Prostate cancer (HCC) 12/12/2005   Dr Renda   Snoring 11/10/2022    Past Surgical History:  Procedure Laterality Date   COLONOSCOPY  2004 & 2014   Negative ;[facility]   EYE SURGERY  09/06/2022   Cataract surgery   INGUINAL HERNIA REPAIR     PROSTATECTOMY  04/11/2006   Dr Renda   UMBILICAL HERNIA REPAIR      Current Medications: Current Meds  Medication Sig   apixaban  (ELIQUIS ) 5 MG TABS tablet Take 1 tablet (5 mg total) by mouth 2 (two) times daily.   Cholecalciferol (VITAMIN D3) 1000 UNITS CAPS Take 1,000 Units by mouth daily.   dapagliflozin  propanediol (FARXIGA ) 10 MG TABS tablet TAKE 1 TABLET(10 MG) BY MOUTH DAILY BEFORE BREAKFAST   Elastic Bandages & Supports (MEDICAL COMPRESSION SOCKS) MISC Use daily for leg swelling from DVT.  Dx: P17.538   gabapentin  (NEURONTIN ) 100 MG capsule Take 2 capsules (200 mg total) by mouth at bedtime.   glimepiride  (AMARYL ) 1 MG tablet TAKE 1 TABLET(1 MG) BY MOUTH DAILY WITH BREAKFAST   hydrALAZINE  (APRESOLINE ) 100 MG tablet TAKE 1 TABLET(100 MG) BY MOUTH THREE TIMES DAILY   Lancets (ONETOUCH ULTRASOFT) lancets use as directed   losartan  (COZAAR ) 100 MG tablet TAKE 1 TABLET(100 MG) BY MOUTH DAILY   metoprolol  tartrate (LOPRESSOR ) 25 MG tablet Take 1 tablet (  25 mg total) by mouth 2 (two) times daily.   Omega-3 Fatty Acids (FISH OIL PO) Take 1,000 mg by mouth daily.   ONE TOUCH ULTRA TEST test strip use as directed   rosuvastatin  (CRESTOR ) 20 MG tablet TAKE 1 TABLET(20 MG) BY MOUTH DAILY     Allergies:   Hydrochlorothiazide  and Metformin  and related   Social History   Socioeconomic History   Marital status: Married    Spouse name: Vertie   Number of children: 1   Years of  education: Not on file   Highest education level: Not on file  Occupational History   Occupation: Full time worker  Tobacco Use   Smoking status: Never   Smokeless tobacco: Never  Vaping Use   Vaping status: Never Used  Substance and Sexual Activity   Alcohol use: No   Drug use: No   Sexual activity: Not on file  Other Topics Concern   Not on file  Social History Narrative   Lives with wife/2025   Social Drivers of Health   Financial Resource Strain: Low Risk  (04/24/2024)   Overall Financial Resource Strain (CARDIA)    Difficulty of Paying Living Expenses: Not hard at all  Food Insecurity: No Food Insecurity (04/24/2024)   Hunger Vital Sign    Worried About Running Out of Food in the Last Year: Never true    Ran Out of Food in the Last Year: Never true  Transportation Needs: No Transportation Needs (04/24/2024)   PRAPARE - Administrator, Civil Service (Medical): No    Lack of Transportation (Non-Medical): No  Physical Activity: Sufficiently Active (04/24/2024)   Exercise Vital Sign    Days of Exercise per Week: 5 days    Minutes of Exercise per Session: 40 min  Stress: No Stress Concern Present (04/24/2024)   [employer] of Occupational Health - Occupational Stress Questionnaire    Feeling of Stress : Not at all  Social Connections: Moderately Isolated (04/24/2024)   Social Connection and Isolation Panel    Frequency of Communication with Friends and Family: Twice a week    Frequency of Social Gatherings with Friends and Family: Never    Attends Religious Services: More than 4 times per year    Active Member of [employer] or Organizations: No    Attends Engineer, structural: Never    Marital Status: Married    Family History: The patient's family history includes Alzheimer's disease in his father and mother; Diabetes in his brother and father; Heart attack in his brother and maternal aunt; Heart attack (age of onset: 81) in his sister; Heart attack  (age of onset: 21) in his mother; Heart disease in his brother; Hypertension in his father and mother. There is no history of Stroke, Colon cancer, Esophageal cancer, or Stomach cancer.  ROS:   Please see the history of present illness.     All other systems reviewed and are negative.  EKGs/Labs/Other Studies Reviewed:     CHA2DS2-VASc Score = 5   This indicates a 7.2% annual risk of stroke. The patient's score is based upon: CHF History: 0 HTN History: 1 Diabetes History: 1 Stroke History: 0 Vascular Disease History: 1 Age Score: 2 Gender Score: 0         Cardiac Studies & Procedures   ______________________________________________________________________________________________   STRESS TESTS  MYOCARDIAL PERFUSION IMAGING 04/12/2024  Interpretation Summary   Findings are consistent with infarction. The study is intermediate risk due to presence of TID and  perfusion defect.   No ST deviation was noted.   LV perfusion is abnormal. There is no evidence of ischemia. There is evidence of infarction. Defect 1: There is a medium defect with moderate reduction in uptake present in the apical to basal inferior location(s) that is fixed. There is abnormal wall motion in the defect area. Consistent with infarction and peri-infarct ischemia.   Left ventricular function is normal. Nuclear stress EF: 64%. The left ventricular ejection fraction is normal (55-65%). End diastolic cavity size is normal. End systolic cavity size is normal. Evidence of transient ischemic dilation (TID) noted.   CT images were obtained for attenuation correction and were examined for the presence of coronary calcium  when appropriate.   Coronary calcium  was present on the attenuation correction CT images. Mild coronary calcifications were present. Coronary calcifications were present in the right coronary artery distribution(s).   Prior study not available for comparison.   ECHOCARDIOGRAM  ECHOCARDIOGRAM COMPLETE  12/08/2022  Narrative ECHOCARDIOGRAM REPORT    Patient Name:   Alfred Foster Date of Exam: 12/08/2022 Medical Rec #:  [id number]      Height:       73.0 in Accession #:    [id number]     Weight:       216.0 lb Date of Birth:  02/04/1946      BSA:          2.223 m Patient Age:    76 years       BP:           110/78 mmHg Patient Gender: M              HR:           59 bpm. Exam Location:  Outpatient  Procedure: 2D Echo, 3D Echo, Color Doppler, Cardiac Doppler and Strain Analysis  Indications:    R06.02 SOB  History:        Patient has no prior history of Echocardiogram examinations. CAD; Risk Factors:Dyslipidemia, Diabetes, Non-Smoker and Hypertension. History of prostate cancer.  Sonographer:    Orvil Holmes RDCS Referring Phys: [id number] TIFFANY [place]  IMPRESSIONS   1. Left ventricular ejection fraction, by estimation, is 65 to 70%. Left ventricular ejection fraction by PLAX is 67 %. The left ventricle has normal function. The left ventricle has no regional wall motion abnormalities. Left ventricular diastolic parameters were normal. 2. Right ventricular systolic function is normal. The right ventricular size is normal. Tricuspid regurgitation signal is inadequate for assessing PA pressure. 3. The mitral valve is abnormal. Trivial mitral valve regurgitation. 4. The aortic valve is tricuspid. Aortic valve regurgitation is not visualized. 5. The inferior vena cava is normal in size with greater than 50% respiratory variability, suggesting right atrial pressure of 3 mmHg.  Comparison(s): No prior Echocardiogram.  FINDINGS Left Ventricle: Left ventricular ejection fraction, by estimation, is 65 to 70%. Left ventricular ejection fraction by PLAX is 67 %. The left ventricle has normal function. The left ventricle has no regional wall motion abnormalities. The left ventricular internal cavity size was normal in size. There is no left ventricular hypertrophy. Left ventricular  diastolic parameters were normal.  Right Ventricle: The right ventricular size is normal. No increase in right ventricular wall thickness. Right ventricular systolic function is normal. Tricuspid regurgitation signal is inadequate for assessing PA pressure.  Left Atrium: Left atrial size was normal in size.  Right Atrium: Right atrial size was normal in size.  Pericardium: There is no evidence of pericardial effusion.  Mitral Valve: The mitral valve is abnormal. There is mild thickening of the anterior and posterior mitral valve leaflet(s). Trivial mitral valve regurgitation.  Tricuspid Valve: The tricuspid valve is grossly normal. Tricuspid valve regurgitation is mild.  Aortic Valve: The aortic valve is tricuspid. Aortic valve regurgitation is not visualized.  Pulmonic Valve: The pulmonic valve was normal in structure. Pulmonic valve regurgitation is not visualized.  Aorta: The aortic root and ascending aorta are structurally normal, with no evidence of dilitation.  Venous: The inferior vena cava is normal in size with greater than 50% respiratory variability, suggesting right atrial pressure of 3 mmHg.  IAS/Shunts: No atrial level shunt detected by color flow Doppler.   LEFT VENTRICLE PLAX 2D LV EF:         Left            Diastology ventricular     LV e' medial:    10.00 cm/s ejection        LV E/e' medial:  9.0 fraction by     LV e' lateral:   10.80 cm/s PLAX is 67      LV E/e' lateral: 8.3 %. LVIDd:         4.43 cm LVIDs:         2.78 cm LV PW:         0.89 cm LV IVS:        0.81 cm         3D Volume EF: LVOT diam:     2.10 cm         3D EF:        42 % LV SV:         69              LV EDV:       134 ml LV SV Index:   31              LV ESV:       78 ml LVOT Area:     3.46 cm        LV SV:        56 ml   RIGHT VENTRICLE RV Basal diam:  3.74 cm RV Mid diam:    3.03 cm RV S prime:     13.70 cm/s TAPSE (M-mode): 1.8 cm  LEFT ATRIUM             Index        RIGHT  ATRIUM           Index LA diam:        3.20 cm 1.44 cm/m   RA Area:     14.00 cm LA Vol (A2C):   72.1 ml 32.43 ml/m  RA Volume:   35.00 ml  15.74 ml/m LA Vol (A4C):   39.2 ml 17.63 ml/m LA Biplane Vol: 58.9 ml 26.49 ml/m AORTIC VALVE LVOT Vmax:   95.90 cm/s LVOT Vmean:  59.200 cm/s LVOT VTI:    0.199 m  AORTA Ao Root diam: 3.40 cm Ao Asc diam:  3.30 cm  MITRAL VALVE MV Area (PHT): 4.15 cm    SHUNTS MV Decel Time: 183 msec    Systemic VTI:  0.20 m MV E velocity: 90.10 cm/s  Systemic Diam: 2.10 cm MV A velocity: 69.20 cm/s MV E/A ratio:  1.30  Vinie Maxcy MD Electronically signed by Vinie Maxcy MD Signature Date/Time: 12/08/2022/2:35:27 PM    Final  ______________________________________________________________________________________________         Recent Labs: 03/15/2024: BNP 7.7 05/17/2024: ALT 20; BUN 27; Creatinine, Ser 1.83; Hemoglobin 13.1; Platelets 244.0; Potassium 4.2; Sodium 140   Recent Lipid Panel    Component Value Date/Time   CHOL 117 05/17/2024 1208   TRIG 78.0 05/17/2024 1208   HDL 41.70 05/17/2024 1208   CHOLHDL 3 05/17/2024 1208   VLDL 15.6 05/17/2024 1208   LDLCALC 60 05/17/2024 1208    Physical Exam:   VS:  BP (!) 118/58 (BP Location: Right Arm, Patient Position: Sitting, Cuff Size: Normal)   Pulse 82   Ht 6' 1 (1.854 m)   Wt 215 lb 6.4 oz (97.7 kg)   SpO2 97%   BMI 28.42 kg/m  , BMI Body mass index is 28.42 kg/m. GENERAL:  Well appearing HEENT: Pupils equal round and reactive, fundi not visualized, oral mucosa unremarkable NECK:  No jugular venous distention, waveform within normal limits, carotid upstroke brisk and symmetric, no bruits, no thyromegaly LYMPHATICS:  No cervical adenopathy LUNGS:  Clear to auscultation bilaterally HEART:  RRR.  PMI not displaced or sustained,S1 and S2 within normal limits, no S3, no S4, no clicks, no rubs, no murmurs ABD:  Flat, positive bowel sounds normal in frequency in pitch,  no bruits, no rebound, no guarding, no midline pulsatile mass, no hepatomegaly, no splenomegaly EXT:  2 plus pulses throughout, no edema, no cyanosis no clubbing SKIN:  No rashes no nodules NEURO:  Cranial nerves II through XII grossly intact, motor grossly intact throughout PSYCH:  Cognitively intact, oriented to person place and time   ASSESSMENT/PLAN:    HTN -  BP well controlled. Continue current antihypertensive regimen hydralazine  100 mg 3 times daily, losartan  100 mg daily, metoprolol  tartrate 25 mg twice daily.Discussed to monitor BP at home at least 2 hours after medications and sitting for 5-10 minutes.   SOB - Echo 11/2022 normal LVEF, no significant valvular abnormalities. ETT 12/31/21 low risk study. Myoview  04/12/24 with RCA stenosis with fixed defect recommended for medical therapy. SOB improved and now only present with bending over but no longer with exertion.  No further workup at this time.  OSA - Sleep study performed 12/16/22 showing severe OSA. Encourage CPAP compliance.  Discussed role untreated OSA would play in atrial fibrillation.  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 Continue Rosuvastatin  20 mg. Not on aspirin  with addition of Eliquis .  Has repeat carotid duplex as ordered by Dr. Raford.  Renal artery stenosis - 12/2022 right renal artery 1-59% stenosis. Due for repeat renal duplex, ordered today.  PAF / Hypercoagulable state -new onset noted at PCP visit 05/17/24. EKG today NSR with PAC. CHA2DS2-VASc Score = 5 [CHF History: 0, HTN History: 1, Diabetes History: 1, Stroke History: 0, Vascular Disease History: 1, Age Score: 2, Gender Score: 0].  Therefore, the patient's annual risk of stroke is 7.2 %.  Continue Eliquis  5mg  BID. Continue Metoprolol  tartrate 25mg  BID.  Update echocardiogram to rule out valvular abnormality contributory to atrial fibrillation.  Suspect related to untreated OSA in setting of nonadherence to CPAP  therapy.  History of DVT of lower extremity- Continue Eliquis  5mg  BID, increased dosing due to PAF.  Screening for Secondary Hypertension:     11/10/2022   11:53 AM  Causes  Drugs/Herbals Screened     - Comments occasional soda, no EtOH, limits salt with cooking but eats out.  No NSAIDs  Renovascular HTN Screened     -  Comments check renal Doppler  Sleep Apnea Screened     - Comments check sleep study  Thyroid  Disease Screened  Hyperaldosteronism Screened  Pheochromocytoma N/A  Coarctation of the Aorta Screened     - Comments BP symmetric  Compliance Screened     - Comments Unclear about whether he is taking amloidpine    Relevant Labs/Studies:    Latest Ref Rng & Units 05/17/2024   12:08 PM 03/15/2024   10:56 AM 11/17/2023   11:38 AM  Basic Labs  Sodium 135 - 145 mEq/L 140  140  140   Potassium 3.5 - 5.1 mEq/L 4.2  4.8  4.3   Creatinine 0.40 - 1.50 mg/dL 8.16  8.11  8.31        Latest Ref Rng & Units 11/17/2022    9:53 AM 10/09/2020    8:30 AM  Thyroid    TSH 0.450 - 4.500 uIU/mL 0.815  0.83        Latest Ref Rng & Units 11/17/2022    9:53 AM  Renin/Aldosterone   Aldosterone 0.0 - 30.0 ng/dL <8.9   Aldos/Renin Ratio 0.0 - 30.0 <.4              12/08/2022    8:16 AM  Renovascular   Renal Artery US  Completed Yes    Disposition:    FU with MD/PharmD in 6 months    Medication Adjustments/Labs and Tests Ordered: Current medicines are reviewed at length with the patient today.  Concerns regarding medicines are outlined above.  Orders Placed This Encounter  Procedures   EKG 12-Lead   No orders of the defined types were placed in this encounter.    Signed, Reche GORMAN Finder, NP  06/24/2024 8:12 AM    [facility]

## 2024-06-24 NOTE — Patient Instructions (Addendum)
 It is very important to take your Eliquis  5mg  twice daily to help prevent stroke. ________________________________  We are updating a renal artery duplex (ultrasound of kidney arteries) for monitoring of mild plaque build up noted by prior study January 2024.   We are ordering an echocardiogram (ultrasound of your heart) to look at your heart valves and ensure there are no abnormalities causing atrial fibrillation (abnormal heart rhythm)/ _______________________________  Very important to wear your CPAP on a regular basis. If untreated, sleep apnea can lead to abnormal heart rhythms (like atrial fibrillation) or heart failure. _______________________________  Your stress test showed mild plaque build up in your right coronary (heart) artery but stable blood flow at rest or with stress. We will plan to treat medically.  For coronary artery disease often called heart disease we aim for optimal guideline directed medical therapy. We use the A, B, Cs to help keep us  on track!  A = Antiplatelet or anticoagulant : this is your Eliquis  B = Beta blocker which helps to relax the heart. This is your Metoprolol . C = Cholesterol control. You take Rosuvastatin  to help control your cholesterol.  D = Diet - following a low sodium, heart healthy diet. E = Exercise - aiming for 150 minutes of moderate intensity exercise each week.  Medication Instructions:  Your physician recommends that you continue on your current medications as directed. Please refer to the Current Medication list given to you today.  *If you need a refill on your cardiac medications before your next appointment, please call your pharmacy*  Testing/Procedures: Your physician has requested that you have an echocardiogram. Echocardiography is a painless test that uses sound waves to create images of your heart. It provides your doctor with information about the size and shape of your heart and how well your heart's chambers and valves  are working. This procedure takes approximately one hour. There are no restrictions for this procedure. Please do NOT wear cologne, perfume, aftershave, or lotions (deodorant is allowed). Please arrive 15 minutes prior to your appointment time.  Please note: We ask at that you not bring children with you during ultrasound (echo/ vascular) testing. Due to room size and safety concerns, children are not allowed in the ultrasound rooms during exams. Our front office staff cannot provide observation of children in our lobby area while testing is being conducted. An adult accompanying a patient to their appointment will only be allowed in the ultrasound room at the discretion of the ultrasound technician under special circumstances. We apologize for any inconvenience.  Your physician has requested that you have a renal artery duplex. During this test, an ultrasound is used to evaluate blood flow to the kidneys. Allow one hour for this exam. Do not eat after midnight the day before and avoid carbonated beverages. Take your medications as you usually do.  Follow-Up: At [facility], you and your health needs are our priority.  As part of our continuing mission to provide you with exceptional heart care, our providers are all part of one team.  This team includes your primary Cardiologist (physician) and Advanced Practice Providers or APPs (Physician Assistants and Nurse Practitioners) who all work together to provide you with the care you need, when you need it.  Your next appointment:   6 month(s) (Advanced Hypertension Clinic)  Provider:   Annabella Scarce, MD or Reche Finder, NP   We recommend signing up for the patient portal called MyChart.  Sign up information is provided on this After  Visit Summary.  MyChart is used to connect with patients for Virtual Visits (Telemedicine).  Patients are able to view lab/test results, encounter notes, upcoming appointments, etc.  Non-urgent messages  can be sent to your provider as well.   To learn more about what you can do with MyChart, go to [email or web address].

## 2024-07-02 ENCOUNTER — Encounter: Payer: Self-pay | Admitting: Internal Medicine

## 2024-07-02 NOTE — Progress Notes (Unsigned)
 Subjective:    Patient ID: Alfred Foster, male    DOB: [birth year], 78 y.o.   MRN: [id number]     HPI Alfred Foster is here for follow up of his chronic medical problems.  BP at home variable -- using a wrist monitor and was not using it correctly - rechecked with his monitor 114/64  Medications and allergies reviewed with patient and updated if appropriate.  Current Outpatient Medications on File Prior to Visit  Medication Sig Dispense Refill   apixaban  (ELIQUIS ) 5 MG TABS tablet Take 1 tablet (5 mg total) by mouth 2 (two) times daily. 60 tablet 5   Cholecalciferol (VITAMIN D3) 1000 UNITS CAPS Take 1,000 Units by mouth daily.     dapagliflozin  propanediol (FARXIGA ) 10 MG TABS tablet TAKE 1 TABLET(10 MG) BY MOUTH DAILY BEFORE BREAKFAST 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 180 tablet 3   Omega-3 Fatty Acids (FISH OIL PO) Take 1,000 mg by mouth daily.     ONE TOUCH ULTRA TEST test strip use as directed 100 each 1   rosuvastatin  (CRESTOR ) 20 MG tablet TAKE 1 TABLET(20 MG) BY MOUTH DAILY 90 tablet 3   No current facility-administered medications on file prior to visit.     Review of Systems  Respiratory:  Positive for shortness of breath (when he bends over and picks something up).   Cardiovascular:  Negative for chest pain, palpitations and leg  swelling.  Neurological:  Negative for light-headedness and headaches.       Objective:   Vitals:   07/03/24 1415  BP: 130/68  Pulse: 76  Temp: 98.1 F (36.7 C)  SpO2: 98%   BP Readings from Last 3 Encounters:  07/03/24 130/68  06/24/24 (!) 118/58  05/17/24 136/70   Wt Readings from Last 3 Encounters:  07/03/24 217 lb (98.4 kg)  06/24/24 215 lb 6.4 oz (97.7 kg)  05/17/24 209 lb (94.8 kg)   Body mass index is 28.63 kg/m.    Physical Exam Constitutional:      General: He is not in acute distress.    Appearance: Normal appearance. He is not ill-appearing.  HENT:     Head: Normocephalic and atraumatic.  Cardiovascular:     Rate and Rhythm: Normal rate and regular rhythm.  Pulmonary:     Effort: Pulmonary effort is normal.     Breath sounds: Normal breath sounds.  Musculoskeletal:     Right lower leg: Edema (mild) present.     Left lower leg: Edema (mild) present.  Neurological:     Mental Status: He is alert.        Lab Results  Component Value Date   WBC 6.4 05/17/2024   HGB 13.1 05/17/2024   HCT 39.8 05/17/2024   PLT  244.0 05/17/2024   GLUCOSE 85 05/17/2024   CHOL 117 05/17/2024   TRIG 78.0 05/17/2024   HDL 41.70 05/17/2024   LDLCALC 60 05/17/2024   ALT 20 05/17/2024   AST 23 05/17/2024   NA 140 05/17/2024   K 4.2 05/17/2024   CL 109 05/17/2024   CREATININE 1.83 (H) 05/17/2024   BUN 27 (H) 05/17/2024   CO2 24 05/17/2024   TSH 0.815 11/17/2022   PSA 0.00 (L) 05/17/2024   INR 1.01 12/14/2012   HGBA1C 6.3 05/17/2024   MICROALBUR 0.7 12/15/2009     Assessment & Plan:    See Problem List for Assessment and Plan of chronic medical problems.

## 2024-07-03 ENCOUNTER — Ambulatory Visit: Admitting: Internal Medicine

## 2024-07-03 VITALS — BP 130/68 | HR 76 | Temp 98.1°F | Ht 73.0 in | Wt 217.0 lb

## 2024-07-03 DIAGNOSIS — I10 Essential (primary) hypertension: Secondary | ICD-10-CM

## 2024-07-03 DIAGNOSIS — G4733 Obstructive sleep apnea (adult) (pediatric): Secondary | ICD-10-CM | POA: Diagnosis not present

## 2024-07-03 DIAGNOSIS — N1832 Chronic kidney disease, stage 3b: Secondary | ICD-10-CM

## 2024-07-03 DIAGNOSIS — E1142 Type 2 diabetes mellitus with diabetic polyneuropathy: Secondary | ICD-10-CM

## 2024-07-03 DIAGNOSIS — I4891 Unspecified atrial fibrillation: Secondary | ICD-10-CM

## 2024-07-03 DIAGNOSIS — E782 Mixed hyperlipidemia: Secondary | ICD-10-CM

## 2024-07-03 NOTE — Assessment & Plan Note (Signed)
 Chronic Was not using CPAP and stressed that he needs to be using this on a regular basis which she is not doing

## 2024-07-03 NOTE — Patient Instructions (Addendum)
        Medications changes include :   None     Return for follow up as scheduled in December.

## 2024-07-03 NOTE — Assessment & Plan Note (Addendum)
 Chronic Blood pressure controlled Continue taking metoprolol  25 mg bid, losartan  100 mg daily, hydralazine  to 100 mg 3 times daily Monitor BP and heart rate at home He is using a wrist cuff and BP has been variable - instructed him how to use this properly and BP was very good

## 2024-07-03 NOTE — Assessment & Plan Note (Signed)
Chronic Regular exercise and healthy diet encouraged Continue Crestor 20 mg daily

## 2024-07-03 NOTE — Assessment & Plan Note (Addendum)
 Chronic Stressed good blood pressure, sugar control Encouraged weight loss No NSAIDs Continue increased water intake

## 2024-07-03 NOTE — Assessment & Plan Note (Signed)
 Chronic  Lab Results  Component Value Date   HGBA1C 6.3 05/17/2024   Sugars controlled Testing sugars 1 times a day Continue glimepiride  1 mg daily, Farxiga  10 mg daily Stressed regular exercise, diabetic diet

## 2024-07-03 NOTE — Assessment & Plan Note (Signed)
 New last month Eliquis  was increased 5 mg twice daily-continue Started on metoprolol  25 mg twice daily-BP, heart rate well-controlled-continue Has seen cardiology

## 2024-07-09 ENCOUNTER — Encounter: Payer: Self-pay | Admitting: Podiatry

## 2024-07-09 ENCOUNTER — Ambulatory Visit (INDEPENDENT_AMBULATORY_CARE_PROVIDER_SITE_OTHER): Admitting: Podiatry

## 2024-07-09 DIAGNOSIS — Z7901 Long term (current) use of anticoagulants: Secondary | ICD-10-CM

## 2024-07-09 DIAGNOSIS — B351 Tinea unguium: Secondary | ICD-10-CM

## 2024-07-09 DIAGNOSIS — M79674 Pain in right toe(s): Secondary | ICD-10-CM

## 2024-07-09 DIAGNOSIS — E119 Type 2 diabetes mellitus without complications: Secondary | ICD-10-CM | POA: Diagnosis not present

## 2024-07-09 DIAGNOSIS — M79675 Pain in left toe(s): Secondary | ICD-10-CM

## 2024-07-09 NOTE — Progress Notes (Signed)
 Subjective: Chief Complaint  Patient presents with   Diabetes    [facility] diet control diabetes. A1C 6.3. Toenail trim.     78 y.o. returns the office today for painful, elongated, thickened toenails which he cannot trim himself.  States the nails grow quite fast causing discomfort and will grow into the skin.   He is on Eliquis   PCP: Geofm Glade PARAS, MD Last Seen: 04/03/2024  Objective: AAO 3, NAD DP pulse 2/4, PT 1/4 b/l, RT < 3 seconds Nails hypertrophic, dystrophic, elongated, brittle, discolored 10. There is tenderness overlying the nails 1-5 bilaterally. There is no surrounding erythema or drainage along the nail sites. No pain with calf compression, swelling, warmth, erythema.  Assessment: Patient presents with symptomatic onychomycosis  Plan: -Treatment options including alternatives, risks, complications were discussed -Nails sharply debrided 10 without complication/bleeding.   Return in about 3 months (around 10/09/2024).   Donnice Fees, DPM

## 2024-07-19 ENCOUNTER — Ambulatory Visit: Payer: Self-pay

## 2024-07-19 NOTE — Telephone Encounter (Signed)
 FYI Only or Action Required?: FYI only for provider.  Patient was last seen in primary care on 07/03/2024 by Geofm Glade PARAS, MD.  Called Nurse Triage reporting Foot Swelling.  Symptoms began several days ago.  Interventions attempted: Nothing.  Symptoms are: gradually worsening.  Triage Disposition: See HCP Within 4 Hours (Or PCP Triage)  Patient/caregiver understands and will follow disposition?: Yes- Wife declined scheduling at this time because patient is working, she will contact patient and let him know he needs to be seen within the next 4 hours. Wife says that if she cannot get in contact with patient, she will have him go to the ED when he gets home from work.   Copied from CRM #[id number]. Topic: Clinical - Red Word Triage >> Jul 19, 2024  8:09 AM Suzen RAMAN wrote: Red Word that prompted transfer to Nurse Triage: right leg and foot is swelling patient takes eliquis  2.5mg .. 2 in the morning and 2 at night to equal 10 mg total for the day Reason for Disposition  [1] Thigh, calf, or ankle swelling AND [2] only 1 side  Answer Assessment - Initial Assessment Questions 1. ONSET: When did the swelling start? (e.g., minutes, hours, days)     Noticed last night that it was really swollen, but it could possibly progressing over the week  2. LOCATION: What part of the leg is swollen?  Are both legs swollen or just one leg?     Right leg and foot; knee to foot  3. SEVERITY: How bad is the swelling? (e.g., localized; mild, moderate, severe)     Noticeable swelling  4. REDNESS: Is there redness or signs of infection?     Not sure if its red  5. PAIN: Is the swelling painful to touch? If Yes, ask: How painful is it?   (Scale 1-10; mild, moderate or severe)     No pain  6. FEVER: Do you have a fever? If Yes, ask: What is it, how was it measured, and when did it start?      Per wife, patient says he does not feel fever  7. CAUSE: What do you think is causing the leg  swelling?     Unsure of cause  8. MEDICAL HISTORY: Do you have a history of blood clots (e.g., DVT), cancer, heart failure, kidney disease, or liver failure?     Has hx of DVT  9. RECURRENT SYMPTOM: Have you had leg swelling before? If Yes, ask: When was the last time? What happened that time?     Yes, has hx of blood clots  10. OTHER SYMPTOMS: Do you have any other symptoms? (e.g., chest pain, difficulty breathing)       Shortness of breath, but that has been ongoing, per wife  Protocols used: Leg Swelling and Edema-A-AH

## 2024-07-20 ENCOUNTER — Encounter (HOSPITAL_COMMUNITY): Payer: Self-pay

## 2024-07-20 ENCOUNTER — Emergency Department (HOSPITAL_COMMUNITY): Admission: EM | Admit: 2024-07-20 | Discharge: 2024-07-20 | Disposition: A | Discharge status: Home / Self Care

## 2024-07-20 ENCOUNTER — Other Ambulatory Visit: Payer: Self-pay

## 2024-07-20 ENCOUNTER — Emergency Department (HOSPITAL_COMMUNITY)

## 2024-07-20 DIAGNOSIS — I82531 Chronic embolism and thrombosis of right popliteal vein: Secondary | ICD-10-CM | POA: Insufficient documentation

## 2024-07-20 DIAGNOSIS — I825Y1 Chronic embolism and thrombosis of unspecified deep veins of right proximal lower extremity: Secondary | ICD-10-CM

## 2024-07-20 DIAGNOSIS — R2241 Localized swelling, mass and lump, right lower limb: Secondary | ICD-10-CM | POA: Diagnosis present

## 2024-07-20 DIAGNOSIS — I82551 Chronic embolism and thrombosis of right peroneal vein: Secondary | ICD-10-CM | POA: Diagnosis not present

## 2024-07-20 DIAGNOSIS — Z7901 Long term (current) use of anticoagulants: Secondary | ICD-10-CM | POA: Insufficient documentation

## 2024-07-20 DIAGNOSIS — I82541 Chronic embolism and thrombosis of right tibial vein: Secondary | ICD-10-CM | POA: Insufficient documentation

## 2024-07-20 DIAGNOSIS — M79661 Pain in right lower leg: Secondary | ICD-10-CM | POA: Diagnosis not present

## 2024-07-20 DIAGNOSIS — I82401 Acute embolism and thrombosis of unspecified deep veins of right lower extremity: Secondary | ICD-10-CM | POA: Diagnosis not present

## 2024-07-20 LAB — CBC WITH DIFFERENTIAL/PLATELET
Abs Immature Granulocytes: 0.07 K/uL (ref 0.00–0.07)
Basophils Absolute: 0 K/uL (ref 0.0–0.1)
Basophils Relative: 1 %
Eosinophils Absolute: 0.2 K/uL (ref 0.0–0.5)
Eosinophils Relative: 4 %
HCT: 40.1 % (ref 39.0–52.0)
Hemoglobin: 12.9 g/dL — ABNORMAL LOW (ref 13.0–17.0)
Immature Granulocytes: 1 %
Lymphocytes Relative: 26 %
Lymphs Abs: 1.4 K/uL (ref 0.7–4.0)
MCH: 31.8 pg (ref 26.0–34.0)
MCHC: 32.2 g/dL (ref 30.0–36.0)
MCV: 98.8 fL (ref 80.0–100.0)
Monocytes Absolute: 0.7 K/uL (ref 0.1–1.0)
Monocytes Relative: 13 %
Neutro Abs: 3.1 K/uL (ref 1.7–7.7)
Neutrophils Relative %: 55 %
Platelets: 196 K/uL (ref 150–400)
RBC: 4.06 MIL/uL — ABNORMAL LOW (ref 4.22–5.81)
RDW: 13.3 % (ref 11.5–15.5)
WBC: 5.5 K/uL (ref 4.0–10.5)
nRBC: 0 % (ref 0.0–0.2)

## 2024-07-20 LAB — COMPREHENSIVE METABOLIC PANEL WITH GFR
ALT: 18 U/L (ref 0–44)
AST: 21 U/L (ref 15–41)
Albumin: 3.4 g/dL — ABNORMAL LOW (ref 3.5–5.0)
Alkaline Phosphatase: 74 U/L (ref 38–126)
Anion gap: 11 (ref 5–15)
BUN: 20 mg/dL (ref 8–23)
CO2: 21 mmol/L — ABNORMAL LOW (ref 22–32)
Calcium: 8.8 mg/dL — ABNORMAL LOW (ref 8.9–10.3)
Chloride: 109 mmol/L (ref 98–111)
Creatinine, Ser: 1.72 mg/dL — ABNORMAL HIGH (ref 0.61–1.24)
GFR, Estimated: 40 mL/min — ABNORMAL LOW (ref >60)
Glucose, Bld: 163 mg/dL — ABNORMAL HIGH (ref 70–99)
Potassium: 4.2 mmol/L (ref 3.5–5.1)
Sodium: 141 mmol/L (ref 135–145)
Total Bilirubin: 0.5 mg/dL (ref 0.0–1.2)
Total Protein: 6.4 g/dL — ABNORMAL LOW (ref 6.5–8.1)

## 2024-07-20 LAB — D-DIMER, QUANTITATIVE: D-Dimer, Quant: 0.42 ug{FEU}/mL (ref 0.00–0.50)

## 2024-07-20 MED ORDER — APIXABAN 5 MG PO TABS: 5.0000 mg | ORAL_TABLET | Freq: Two times a day (BID) | ORAL | 0 refills | Status: DC

## 2024-07-20 NOTE — Progress Notes (Addendum)
 VASCULAR LAB    Right lower extremity venous duplex has been performed.  See CV proc for preliminary results.  Gave verbal report to Dr. Gennaro LIS, [facility], RVT 07/20/2024, 9:39 AM

## 2024-07-20 NOTE — ED Triage Notes (Signed)
 Pt states that he has right lower leg swelling x 2-3 weeks with progressive worsening. Pt denies pain and denies specific injury. He reports that he called his PMD yesterday, who suggested pt come to ED to rule out blood clot. Pt states that he does have a history of blood clots, but can't remember the details.

## 2024-07-20 NOTE — Discharge Instructions (Addendum)
 Continue your Eliquis  5 mg twice daily as prescribed.  A refill was sent.  Is important that you follow-up with your primary care doctor to continue to have this medication refilled.  Please call the office and follow-up with them next week.  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  Return to the ER for any new or worsening symptoms.

## 2024-07-20 NOTE — ED Provider Notes (Signed)
 [facility] AT [facility] Provider Note   CSN: [id number] Arrival date & time: 07/20/24  9460     Patient presents with: Leg Pain   Alfred Foster is a 78 y.o. male.   78 year old male presents for evaluation of right leg swelling.  States has been going for couple days now but he does state that he has a history of blood clots.  States he has been compliant with his Eliquis  but did not take any today.  States he recently decreased his dose to half the usual dose because he started running out of Eliquis .  He denies any shortness of breath or chest pain but he does state that he was told to come to the ER by his primary care doctor for ultrasound to rule out blood clot.  Denies any other symptoms or concerns.   Leg Pain Associated symptoms: no back pain and no fever        Prior to Admission medications   Medication Sig Start Date End Date Taking? Authorizing Provider  apixaban  (ELIQUIS ) 5 MG TABS tablet Take 1 tablet (5 mg total) by mouth 2 (two) times daily for 14 days. 07/20/24 08/03/24  Josph Norfleet L, DO  Cholecalciferol (VITAMIN D3) 1000 UNITS CAPS Take 1,000 Units by mouth daily.    [provider]  dapagliflozin  propanediol (FARXIGA ) 10 MG TABS tablet TAKE 1 TABLET(10 MG) BY MOUTH DAILY BEFORE BREAKFAST 04/04/24   Burns, Glade PARAS, MD  Elastic Bandages & Supports (MEDICAL COMPRESSION SOCKS) MISC Use daily for leg swelling from DVT.  Dx: P17.538 03/23/19   Geofm Glade PARAS, MD  gabapentin  (NEURONTIN ) 100 MG capsule TAKE 2 CAPSULES(200 MG) BY MOUTH AT BEDTIME 06/24/24   Burns, Glade PARAS, MD  glimepiride  (AMARYL ) 1 MG tablet TAKE 1 TABLET(1 MG) BY MOUTH DAILY WITH BREAKFAST 05/09/24   Geofm Glade PARAS, MD  hydrALAZINE  (APRESOLINE ) 100 MG tablet TAKE 1 TABLET(100 MG) BY MOUTH THREE TIMES DAILY 04/26/24   Raford Riggs, MD  Lancets [facility] ULTRASOFT) lancets use as directed 11/09/12   Tish Elsie FALCON, MD  losartan  (COZAAR ) 100 MG tablet TAKE 1  TABLET(100 MG) BY MOUTH DAILY 09/12/23   Geofm Glade PARAS, MD  metoprolol  tartrate (LOPRESSOR ) 25 MG tablet Take 1 tablet (25 mg total) by mouth 2 (two) times daily. 05/17/24   Geofm Glade PARAS, MD  Omega-3 Fatty Acids (FISH OIL PO) Take 1,000 mg by mouth daily.    [provider]  ONE TOUCH ULTRA TEST test strip use as directed 11/09/12   Tish Elsie FALCON, MD  rosuvastatin  (CRESTOR ) 20 MG tablet TAKE 1 TABLET(20 MG) BY MOUTH DAILY 11/21/23   Geofm Glade PARAS, MD    Allergies: Hydrochlorothiazide  and Metformin  and related    Review of Systems  Constitutional:  Negative for chills and fever.  HENT:  Negative for ear pain and sore throat.   Eyes:  Negative for pain and visual disturbance.  Respiratory:  Negative for cough and shortness of breath.   Cardiovascular:  Positive for leg swelling. Negative for chest pain and palpitations.  Gastrointestinal:  Negative for abdominal pain and vomiting.  Genitourinary:  Negative for dysuria and hematuria.  Musculoskeletal:  Negative for arthralgias and back pain.  Skin:  Negative for color change and rash.  Neurological:  Negative for seizures and syncope.  All other systems reviewed and are negative.   Updated Vital Signs BP (!) 140/73   Pulse 64   Temp 97.6 F (36.4 C) (Oral)  Resp 18   SpO2 100%   Physical Exam Vitals and nursing note reviewed.  Constitutional:      General: He is not in acute distress.    Appearance: He is well-developed.  HENT:     Head: Normocephalic and atraumatic.  Eyes:     Conjunctiva/sclera: Conjunctivae normal.  Cardiovascular:     Rate and Rhythm: Normal rate and regular rhythm.     Pulses: Normal pulses.     Heart sounds: Normal heart sounds. No murmur heard. Pulmonary:     Effort: Pulmonary effort is normal. No respiratory distress.     Breath sounds: Normal breath sounds. No stridor. No wheezing or rhonchi.  Abdominal:     Palpations: Abdomen is soft.     Tenderness: There is no abdominal  tenderness.  Musculoskeletal:        General: No swelling.     Cervical back: Neck supple.     Right lower leg: Edema present.     Comments: Right leg is more swollen than left, +2 edema in right lower extremity  Skin:    General: Skin is warm and dry.     Capillary Refill: Capillary refill takes less than 2 seconds.  Neurological:     Mental Status: He is alert.  Psychiatric:        Mood and Affect: Mood normal.     (all labs ordered are listed, but only abnormal results are displayed) Labs Reviewed  CBC WITH DIFFERENTIAL/PLATELET - Abnormal; Notable for the following components:      Result Value   RBC 4.06 (*)    Hemoglobin 12.9 (*)    All other components within normal limits  COMPREHENSIVE METABOLIC PANEL WITH GFR - Abnormal; Notable for the following components:   CO2 21 (*)    Glucose, Bld 163 (*)    Creatinine, Ser 1.72 (*)    Calcium  8.8 (*)    Total Protein 6.4 (*)    Albumin  3.4 (*)    GFR, Estimated 40 (*)    All other components within normal limits  D-DIMER, QUANTITATIVE    EKG: None  Radiology: VAS US  LOWER EXTREMITY VENOUS (DVT) (ONLY [facility] & [facility]) Result Date: 07/20/2024  Lower Venous DVT Study Patient Name:  Alfred Foster  Date of Exam:   07/20/2024 Medical Rec #: [id number]       Accession #:    [id number] Date of Birth: 1946/11/23       Patient Gender: M Patient Age:   61 years Exam Location:  [facility] Procedure:      VAS US  LOWER EXTREMITY VENOUS (DVT) Referring Phys: CYNDEE COUNTRYMAN --------------------------------------------------------------------------------  Indications: Increased swelling of right leg X 1 month.  Risk Factors: PE 2020 DVT 2019 Atrial fibrillation. Prostate cancer 2007 Anticoagulation: Eliquis  (history of self adjusting medictions). Limitations: Poor ultrasound/tissue interface and edema. Comparison Study: Prior LEV 12/11/2019 indicated chronic DVT in the right                   popliteal, posterior tibial, and peroneal veins.  Prior LEV                   09/12/2018 indicated acute DVT in the right femoral, popliteal,                   posterior tibial, peroneal, soleal, and gastroc veins. Performing Technologist: Alberta Lis RVS  Examination Guidelines: A complete evaluation includes B-mode imaging, spectral Doppler, color Doppler, and power Doppler  as needed of all accessible portions of each vessel. Bilateral testing is considered an integral part of a complete examination. Limited examinations for reoccurring indications may be performed as noted. The reflux portion of the exam is performed with the patient in reverse Trendelenburg.  +---------+---------------+---------+-----------+----------+--------------+ RIGHT    CompressibilityPhasicitySpontaneityPropertiesThrombus Aging +---------+---------------+---------+-----------+----------+--------------+ CFV      Full           Yes      No                                  +---------+---------------+---------+-----------+----------+--------------+ SFJ      Full                                                        +---------+---------------+---------+-----------+----------+--------------+ FV Prox  Full           Yes      No                                  +---------+---------------+---------+-----------+----------+--------------+ FV Mid   Full                                                        +---------+---------------+---------+-----------+----------+--------------+ FV DistalFull                                                        +---------+---------------+---------+-----------+----------+--------------+ PFV      Full           Yes      No                                  +---------+---------------+---------+-----------+----------+--------------+ POP      Partial        Yes      No                   Chronic        +---------+---------------+---------+-----------+----------+--------------+ PTV      Partial                                       Chronic        +---------+---------------+---------+-----------+----------+--------------+ PERO     Partial                                      Chronic        +---------+---------------+---------+-----------+----------+--------------+ Soleal   Full                                                        +---------+---------------+---------+-----------+----------+--------------+  Gastroc  Full                                                        +---------+---------------+---------+-----------+----------+--------------+   +----+---------------+---------+-----------+----------+--------------+ LEFTCompressibilityPhasicitySpontaneityPropertiesThrombus Aging +----+---------------+---------+-----------+----------+--------------+ CFV Full           Yes      No                                  +----+---------------+---------+-----------+----------+--------------+ SFJ Full                                                        +----+---------------+---------+-----------+----------+--------------+     Summary: RIGHT: - Findings consistent with chronic deep vein thrombosis involving the right popliteal vein, right posterior tibial veins, and right peroneal veins.  - No cystic structure found in the popliteal fossa. Subcutaneous edema throughout calf  LEFT: - No evidence of common femoral vein obstruction.   *See table(s) above for measurements and observations.    Preliminary      Procedures   Medications Ordered in the ED - No data to display                                  Medical Decision Making 78 year old male here for leg swelling.  Found to have chronic DVT on his ultrasound but nothing acute.  Recently decrease his Eliquis  to 2.5 mg twice a day as he ran out of 5 mg pills.  I will refill the 5 mg twice a day for him.  Advise close follow-up with primary care and otherwise return to the ER for any new or worsening symptoms.  Patient feels  comfortable with the plan to be discharged home.  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    Details: Prior outpatient records reviewed and patient seen at the end of July in the primary care doctor office for onychomycosis Labs: ordered. Decision-making details documented in ED Course.    Details: Ordered and reviewed by me and D-dimer negative, labs otherwise unremarkable Radiology: ordered and independent interpretation performed. Decision-making details documented in ED Course.    Details: Ordered and reviewed by me and shows evidence of chronic DVT but no acute abnormality  Risk OTC drugs. Prescription drug management.     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

## 2024-07-25 ENCOUNTER — Encounter: Payer: Self-pay | Admitting: Internal Medicine

## 2024-07-25 NOTE — Progress Notes (Signed)
 Subjective:    Patient ID: Alfred Foster, male    DOB: 1946-08-26, 78 y.o.   MRN: [id number]     HPI Alfred Foster is here for follow up from the hospital.   ED 8/9 for RLE swelling.  It started a couple of days prior.  He has a hx of DVTs.  He is on eliquis  and has been taking it as prescribed, but did not take it that day.  He did state that he recently decreased his dose to half the usual dose because he started running out of Eliquis .  He denied any shortness of breath or chest pain.  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  No evidence of left common femoral vein obstruction.  No changes in medications were made.  In June - RLE got a little bigger -it seemed to get progressively bigger.  He thinks it has gone down a little.  He wears compression socks. He elevates his leg at night.   He has been taking the correct dose of Eliquis -he had been taking two 2-1/2 mg pills twice daily   Medications and allergies reviewed with patient and updated if appropriate.  Current Outpatient Medications on File Prior to Visit  Medication Sig Dispense Refill   apixaban  (ELIQUIS ) 5 MG TABS tablet Take 1 tablet (5 mg total) by mouth 2 (two) times daily for 14 days. 28 tablet 0   Cholecalciferol (VITAMIN D3) 1000 UNITS CAPS Take 1,000 Units by mouth daily.     dapagliflozin  propanediol (FARXIGA ) 10 MG TABS tablet TAKE 1 TABLET(10 MG) BY MOUTH DAILY BEFORE BREAKFAST 90 tablet 1   Elastic Bandages & Supports (MEDICAL COMPRESSION SOCKS) MISC Use daily for leg swelling from DVT.  Dx: I82.461 2 each 0   gabapentin  (NEURONTIN ) 100 MG capsule TAKE 2 CAPSULES(200 MG) BY MOUTH AT BEDTIME 60 capsule 5   glimepiride  (AMARYL ) 1 MG tablet TAKE 1 TABLET(1 MG) BY MOUTH DAILY WITH BREAKFAST 90 tablet 1   hydrALAZINE  (APRESOLINE ) 100 MG tablet TAKE 1 TABLET(100 MG) BY MOUTH THREE TIMES DAILY 270 tablet 1   Lancets (ONETOUCH ULTRASOFT) lancets  use as directed 100 each 1   losartan  (COZAAR ) 100 MG tablet TAKE 1 TABLET(100 MG) BY MOUTH DAILY 90 tablet 3   metoprolol  tartrate (LOPRESSOR ) 25 MG tablet Take 1 tablet (25 mg total) by mouth 2 (two) times daily. 180 tablet 3   Omega-3 Fatty Acids (FISH OIL PO) Take 1,000 mg by mouth daily.     ONE TOUCH ULTRA TEST test strip use as directed 100 each 1   rosuvastatin  (CRESTOR ) 20 MG tablet TAKE 1 TABLET(20 MG) BY MOUTH DAILY 90 tablet 3   No current facility-administered medications on file prior to visit.     Review of Systems  Constitutional:  Negative for fever.  Respiratory:  Positive for shortness of breath (bending over or lifting things - not new). Negative for cough and wheezing.   Cardiovascular:  Positive for leg swelling. Negative for chest pain and palpitations.       Objective:   Vitals:   07/26/24 1418  BP: 124/76  Pulse: 78  Temp: 98.7 F (37.1 C)  SpO2: 100%   BP Readings from Last 3 Encounters:  07/26/24 124/76  07/20/24 (!) 140/73  07/03/24 130/68   Wt Readings from Last 3 Encounters:  07/26/24 218 lb (98.9 kg)  07/03/24 217 lb (98.4 kg)  06/24/24 215 lb 6.4 oz (  97.7 kg)   Body mass index is 28.76 kg/m.    Physical Exam Constitutional:      General: He is not in acute distress.    Appearance: Normal appearance. He is not ill-appearing.  HENT:     Head: Normocephalic and atraumatic.  Eyes:     Conjunctiva/sclera: Conjunctivae normal.  Cardiovascular:     Rate and Rhythm: Normal rate and regular rhythm.     Heart sounds: Normal heart sounds.  Pulmonary:     Effort: Pulmonary effort is normal. No respiratory distress.     Breath sounds: Normal breath sounds. No wheezing or rales.  Musculoskeletal:     Right lower leg: Edema (Chronically larger than left.  Chronic skin changes-thickening distal lower extremity and 1+ edema) present.     Left lower leg: No edema.  Skin:    General: Skin is warm and dry.     Findings: No rash.  Neurological:      Mental Status: He is alert. Mental status is at baseline.  Psychiatric:        Mood and Affect: Mood normal.        Lab Results  Component Value Date   WBC 5.5 07/20/2024   HGB 12.9 (L) 07/20/2024   HCT 40.1 07/20/2024   PLT 196 07/20/2024   GLUCOSE 163 (H) 07/20/2024   CHOL 117 05/17/2024   TRIG 78.0 05/17/2024   HDL 41.70 05/17/2024   LDLCALC 60 05/17/2024   ALT 18 07/20/2024   AST 21 07/20/2024   NA 141 07/20/2024   K 4.2 07/20/2024   CL 109 07/20/2024   CREATININE 1.72 (H) 07/20/2024   BUN 20 07/20/2024   CO2 21 (L) 07/20/2024   TSH 0.815 11/17/2022   PSA 0.00 (L) 05/17/2024   INR 1.01 12/14/2012   HGBA1C 6.3 05/17/2024   MICROALBUR 0.7 12/15/2009   VAS US  LOWER EXTREMITY VENOUS (DVT) (ONLY [facility] & [facility])  Lower Venous DVT Study  Patient Name:  Alfred Foster  Date of Exam:   07/20/2024 Medical Rec #: [id number]       Accession #:    [id number] Date of Birth: 12/26/1945       Patient Gender: M Patient Age:   29 years Exam Location:  [facility] Procedure:      VAS US  LOWER EXTREMITY VENOUS (DVT) Referring Phys: CYNDEE COUNTRYMAN  --------------------------------------------------------------------------------   Indications: Increased swelling of right leg X 1 month.   Risk Factors: PE 2020 DVT 2019 Atrial fibrillation. Prostate cancer 2007 Anticoagulation: Eliquis  (history of self adjusting medictions). Limitations: Poor ultrasound/tissue interface and edema. Comparison Study: Prior LEV 12/11/2019 indicated chronic DVT in the right                   popliteal, posterior tibial, and peroneal veins. Prior LEV                   09/12/2018 indicated acute DVT in the right femoral, popliteal,                   posterior tibial, peroneal, soleal, and gastroc veins.  Performing Technologist: Alberta Lis RVS    Examination Guidelines: A complete evaluation includes B-mode imaging, spectral Doppler, color Doppler, and power Doppler as needed of all  accessible portions of each vessel. Bilateral testing is considered an integral part of a complete examination. Limited examinations for reoccurring indications may be performed as noted. The reflux portion of the exam is performed with the patient  in reverse Trendelenburg.     +---------+---------------+---------+-----------+----------+--------------+ RIGHT    CompressibilityPhasicitySpontaneityPropertiesThrombus Aging +---------+---------------+---------+-----------+----------+--------------+ CFV      Full           Yes      No                                  +---------+---------------+---------+-----------+----------+--------------+ SFJ      Full                                                        +---------+---------------+---------+-----------+----------+--------------+ FV Prox  Full           Yes      No                                  +---------+---------------+---------+-----------+----------+--------------+ FV Mid   Full                                                        +---------+---------------+---------+-----------+----------+--------------+ FV DistalFull                                                        +---------+---------------+---------+-----------+----------+--------------+ PFV      Full           Yes      No                                  +---------+---------------+---------+-----------+----------+--------------+ POP      Partial        Yes      No                   Chronic        +---------+---------------+---------+-----------+----------+--------------+ PTV      Partial                                      Chronic        +---------+---------------+---------+-----------+----------+--------------+ PERO     Partial                                      Chronic        +---------+---------------+---------+-----------+----------+--------------+ Soleal   Full                                                         +---------+---------------+---------+-----------+----------+--------------+ Gastroc  Full                                                        +---------+---------------+---------+-----------+----------+--------------+        +----+---------------+---------+-----------+----------+--------------+  LEFTCompressibilityPhasicitySpontaneityPropertiesThrombus Aging +----+---------------+---------+-----------+----------+--------------+ CFV Full           Yes      No                                  +----+---------------+---------+-----------+----------+--------------+ SFJ Full                                                        +----+---------------+---------+-----------+----------+--------------+             Summary: RIGHT:  - Findings consistent with chronic deep vein thrombosis involving the right popliteal vein, right posterior tibial veins, and right peroneal veins.   - No cystic structure found in the popliteal fossa. Subcutaneous edema throughout calf   LEFT: - No evidence of common femoral vein obstruction.       *See table(s) above for measurements and observations.  Electronically signed by Norman Serve on 07/21/2024 at 4:08:24 PM.      Final      Assessment & Plan:    See Problem List for Assessment and Plan of chronic medical problems.

## 2024-07-26 ENCOUNTER — Ambulatory Visit (INDEPENDENT_AMBULATORY_CARE_PROVIDER_SITE_OTHER): Admitting: Internal Medicine

## 2024-07-26 VITALS — BP 124/76 | HR 78 | Temp 98.7°F | Ht 73.0 in | Wt 218.0 lb

## 2024-07-26 DIAGNOSIS — I10 Essential (primary) hypertension: Secondary | ICD-10-CM

## 2024-07-26 DIAGNOSIS — I82562 Chronic embolism and thrombosis of left calf muscular vein: Secondary | ICD-10-CM | POA: Diagnosis not present

## 2024-07-26 MED ORDER — APIXABAN 5 MG PO TABS: 5.0000 mg | ORAL_TABLET | Freq: Two times a day (BID) | ORAL | 3 refills | Status: AC

## 2024-07-26 MED ORDER — DAPAGLIFLOZIN PROPANEDIOL 10 MG PO TABS: 10.0000 mg | ORAL_TABLET | Freq: Every day | ORAL | 1 refills | Status: AC

## 2024-07-26 NOTE — Patient Instructions (Addendum)
       Medications changes include :   None      Return for follow up as scheduled.

## 2024-07-26 NOTE — Assessment & Plan Note (Signed)
 Chronic Recent ultrasound in the emergency room showed chronic clot-no acute clot Continue lifelong anticoagulation Continue Eliquis  5 mg twice daily given atrial fibrillation

## 2024-07-26 NOTE — Assessment & Plan Note (Signed)
 Chronic Blood pressure controlled Continue taking metoprolol  25 mg bid, losartan  100 mg daily, hydralazine  to 100 mg 3 times daily Monitor BP and heart rate at home He is using a wrist cuff and BP has been variable - instructed him how to use this properly and BP was very good

## 2024-07-29 ENCOUNTER — Telehealth: Payer: Self-pay | Admitting: Family

## 2024-07-29 NOTE — Telephone Encounter (Signed)
 S/w pt's spouse per [facility]) answered all questions about upcoming details of tests.

## 2024-07-29 NOTE — Telephone Encounter (Signed)
 Pt's wife would like a c/b regarding upcoming test that pt has this week. Please advise

## 2024-08-02 ENCOUNTER — Other Ambulatory Visit (HOSPITAL_BASED_OUTPATIENT_CLINIC_OR_DEPARTMENT_OTHER)

## 2024-08-02 ENCOUNTER — Encounter (HOSPITAL_BASED_OUTPATIENT_CLINIC_OR_DEPARTMENT_OTHER)

## 2024-08-02 ENCOUNTER — Ambulatory Visit (HOSPITAL_BASED_OUTPATIENT_CLINIC_OR_DEPARTMENT_OTHER): Payer: Self-pay | Admitting: Family

## 2024-08-02 DIAGNOSIS — R0609 Other forms of dyspnea: Secondary | ICD-10-CM | POA: Diagnosis not present

## 2024-08-02 DIAGNOSIS — I480 Paroxysmal atrial fibrillation: Secondary | ICD-10-CM

## 2024-08-02 DIAGNOSIS — I701 Atherosclerosis of renal artery: Secondary | ICD-10-CM

## 2024-08-02 DIAGNOSIS — R600 Localized edema: Secondary | ICD-10-CM | POA: Diagnosis not present

## 2024-08-02 DIAGNOSIS — I10 Essential (primary) hypertension: Secondary | ICD-10-CM | POA: Diagnosis not present

## 2024-08-02 LAB — ECHOCARDIOGRAM COMPLETE
AR max vel: 2.94 cm2
AV Area VTI: 3.18 cm2
AV Area mean vel: 2.96 cm2
AV Mean grad: 5 mmHg
AV Peak grad: 9.2 mmHg
Ao pk vel: 1.52 m/s
Area-P 1/2: 3.65 cm2
S' Lateral: 2.89 cm

## 2024-08-14 ENCOUNTER — Telehealth: Payer: Self-pay

## 2024-08-14 NOTE — Telephone Encounter (Signed)
Forms received today.

## 2024-08-14 NOTE — Telephone Encounter (Signed)
 Copied from CRM [id number]. Topic: General - Other >> Aug 14, 2024  9:39 AM Macario HERO wrote: Reason for CRM: Patient spouse called regarding FMLA forms that were dropped off yesterday. She is requesting we call them when it's completed because it's late.

## 2024-08-22 ENCOUNTER — Telehealth: Payer: Self-pay

## 2024-08-22 ENCOUNTER — Other Ambulatory Visit: Payer: Self-pay

## 2024-08-22 ENCOUNTER — Telehealth: Payer: Self-pay | Admitting: Radiology

## 2024-08-22 MED ORDER — COVID-19 MRNA VAC-TRIS(PFIZER) 30 MCG/0.3ML IM SUSY: 0.3000 mL | PREFILLED_SYRINGE | Freq: Once | INTRAMUSCULAR | 0 refills | Status: AC

## 2024-08-22 NOTE — Telephone Encounter (Signed)
 Vaccine ordered.

## 2024-08-22 NOTE — Telephone Encounter (Signed)
 Spoke with spouse today.  Form complete and waiting for signature from Dr. Geofm.  Patient made aware he can pick form up tomorrow.

## 2024-08-22 NOTE — Telephone Encounter (Signed)
 Copied from CRM #[id number]. Topic: Clinical - Medication Question >> Aug 22, 2024  2:38 PM Turkey A wrote: Reason for CRM: Patient's wife called and would like for the Covid Vaccine prescription to be sent to [facility] on [address]-please contact

## 2024-08-22 NOTE — Telephone Encounter (Signed)
 Copied from CRM [id number]. Topic: General - Other >> Aug 14, 2024  9:39 AM Macario HERO wrote: Reason for CRM: Patient spouse called regarding FMLA forms that were dropped off yesterday. She is requesting we call them when it's completed because it's late. >> Aug 22, 2024  8:19 AM Rosina BIRCH wrote: Patient wife called checking on FMLA paperwork and she want to know when it is ready [phone] >> Aug 19, 2024  8:44 AM Henretta I wrote: Patients spouse called back regarding FMLA paperwork, advised 7-10 business days for paperwork they will give a her a call when ready

## 2024-08-23 NOTE — Telephone Encounter (Signed)
Form completed and left up front for pick up

## 2024-08-24 DIAGNOSIS — Z0279 Encounter for issue of other medical certificate: Secondary | ICD-10-CM

## 2024-09-03 ENCOUNTER — Other Ambulatory Visit: Payer: Self-pay | Admitting: Internal Medicine

## 2024-09-04 DIAGNOSIS — E785 Hyperlipidemia, unspecified: Secondary | ICD-10-CM | POA: Diagnosis not present

## 2024-09-04 DIAGNOSIS — N1832 Chronic kidney disease, stage 3b: Secondary | ICD-10-CM | POA: Diagnosis not present

## 2024-09-04 DIAGNOSIS — Z86718 Personal history of other venous thrombosis and embolism: Secondary | ICD-10-CM | POA: Diagnosis not present

## 2024-09-04 DIAGNOSIS — E1122 Type 2 diabetes mellitus with diabetic chronic kidney disease: Secondary | ICD-10-CM | POA: Diagnosis not present

## 2024-09-04 DIAGNOSIS — I129 Hypertensive chronic kidney disease with stage 1 through stage 4 chronic kidney disease, or unspecified chronic kidney disease: Secondary | ICD-10-CM | POA: Diagnosis not present

## 2024-10-03 ENCOUNTER — Telehealth: Payer: Self-pay

## 2024-10-03 NOTE — Telephone Encounter (Signed)
 Copied from CRM [id number]. Topic: Medical Record Request - Other >> Oct 03, 2024  9:38 AM Robinson H wrote: Reason for CRM: Patients wife wants to make sure [facility] sent over documents showing patient had his flu vaccine and covid vaccine for his records.  Vertie [phone]

## 2024-10-10 NOTE — Telephone Encounter (Signed)
 Updated

## 2024-10-11 ENCOUNTER — Ambulatory Visit: Admitting: Podiatry

## 2024-10-11 DIAGNOSIS — M79674 Pain in right toe(s): Secondary | ICD-10-CM

## 2024-10-11 DIAGNOSIS — M79675 Pain in left toe(s): Secondary | ICD-10-CM

## 2024-10-11 DIAGNOSIS — B351 Tinea unguium: Secondary | ICD-10-CM | POA: Diagnosis not present

## 2024-10-11 DIAGNOSIS — Z7901 Long term (current) use of anticoagulants: Secondary | ICD-10-CM

## 2024-10-11 NOTE — Progress Notes (Signed)
 Subjective: Chief Complaint  Patient presents with   Nail Problem    Patient is here for RFC Pain due to onychomycosis of toenails of both feet      78 y.o. returns the office today for painful, elongated, thickened toenails which he cannot trim himself. No open lesions.   He is on Eliquis   PCP: Geofm Glade PARAS, MD Last Seen: 07/26/2024  Objective: AAO 3, NAD DP pulse 2/4, PT 1/4 b/l, RT < 3 seconds Nails hypertrophic, dystrophic, elongated, brittle, discolored 10. There is tenderness overlying the nails 1-5 bilaterally. There is no surrounding erythema or drainage along the nail sites. No pain with calf compression, swelling, warmth, erythema.  Assessment: Patient presents with symptomatic onychomycosis  Plan: -Treatment options including alternatives, risks, complications were discussed -Nails sharply debrided 10 without complication/bleeding.   Return in about 3 months (around 01/11/2025).  Donnice Fees, DPM

## 2024-10-23 ENCOUNTER — Ambulatory Visit (INDEPENDENT_AMBULATORY_CARE_PROVIDER_SITE_OTHER): Payer: Medicare Other

## 2024-10-23 DIAGNOSIS — I6523 Occlusion and stenosis of bilateral carotid arteries: Secondary | ICD-10-CM

## 2024-10-29 ENCOUNTER — Ambulatory Visit (HOSPITAL_BASED_OUTPATIENT_CLINIC_OR_DEPARTMENT_OTHER): Payer: Self-pay | Admitting: Family

## 2024-10-30 ENCOUNTER — Telehealth: Payer: Self-pay | Admitting: Cardiovascular Disease

## 2024-10-30 NOTE — Telephone Encounter (Signed)
 Pt wife requesting operative surgical reports from 8/22 and 11/12 be mailed. Please advise.

## 2024-11-04 ENCOUNTER — Other Ambulatory Visit: Payer: Self-pay | Admitting: Internal Medicine

## 2024-11-04 NOTE — Telephone Encounter (Signed)
 From HIM  Message  Spoke to the patients wife as he was at work. She asked that I will mail an authorization form for patient to complete for medical records.  Thanks.

## 2024-11-11 ENCOUNTER — Ambulatory Visit (HOSPITAL_BASED_OUTPATIENT_CLINIC_OR_DEPARTMENT_OTHER): Admitting: Cardiovascular Disease

## 2024-11-11 ENCOUNTER — Encounter (HOSPITAL_BASED_OUTPATIENT_CLINIC_OR_DEPARTMENT_OTHER): Payer: Self-pay | Admitting: Cardiovascular Disease

## 2024-11-11 VITALS — BP 118/60 | HR 85 | Ht 73.0 in | Wt 221.0 lb

## 2024-11-11 DIAGNOSIS — I6523 Occlusion and stenosis of bilateral carotid arteries: Secondary | ICD-10-CM | POA: Diagnosis not present

## 2024-11-11 DIAGNOSIS — Z5181 Encounter for therapeutic drug level monitoring: Secondary | ICD-10-CM

## 2024-11-11 DIAGNOSIS — I10 Essential (primary) hypertension: Secondary | ICD-10-CM

## 2024-11-11 DIAGNOSIS — I82562 Chronic embolism and thrombosis of left calf muscular vein: Secondary | ICD-10-CM

## 2024-11-11 DIAGNOSIS — E782 Mixed hyperlipidemia: Secondary | ICD-10-CM

## 2024-11-11 DIAGNOSIS — G4733 Obstructive sleep apnea (adult) (pediatric): Secondary | ICD-10-CM

## 2024-11-11 DIAGNOSIS — N1832 Chronic kidney disease, stage 3b: Secondary | ICD-10-CM

## 2024-11-11 MED ORDER — ROSUVASTATIN CALCIUM 40 MG PO TABS: 40.0000 mg | ORAL_TABLET | Freq: Every day | ORAL | 3 refills | Status: AC

## 2024-11-11 NOTE — Patient Instructions (Signed)
 Medication Instructions:  INCREASE ROSUVASTATIN  TO 40 MG DAILY   Labwork: FASTING LIPID/CMET/LPa IN FEBRUARY   Testing/Procedures: Your physician has requested that you have a carotid duplex. This test is an ultrasound of the carotid arteries in your neck. It looks at blood flow through these arteries that supply the brain with blood. Allow one hour for this exam. There are no restrictions or special instructions. TO BE DONE NOVEMBER 2026  Follow-Up: 1 YEAR ABOUT A WEEK AFTER CAROTID

## 2024-11-11 NOTE — Progress Notes (Signed)
 [facility] Follow-up:    Date:  11/11/2024   ID:  Alfred Foster, DOB 1946-01-18, MRN [id number]  PCP:  Geofm Glade PARAS, MD  Cardiologist:  Annabella Scarce, MD   Referring MD: Geofm Glade PARAS, MD   CC: Hypertension  History of Present Illness:    Alfred Foster is a 78 y.o. male with a hx of carotid stenosis, DVT, CKD III, hypertension, hyperlipidemia, diabetes mellitus, prostate cancer, OSA on CPAP, here for follow-up. He was initially seen 11/10/2022 in the [facility]. He last saw Dr. Lavona 12/2021 for shortness of breath. He had an ETT that showed frequent PVCs and up to 7 beats of NSVT. His blood pressure was 185/95 at rest, and increased to 213/106 with exertion. It was negative for ischemia. Carotid dopplers at that time revealed mild stenosis bilaterally. Metoprolol  was increased. He saw Dr. Geofm 11/08/2022, and his blood pressure was 150/90. His home blood pressures were mostly elevated but fluctuated. Hydralazine  was increased, and he remained on amlodipine , losartan , metoprolol , and spironolactone .  At his initial appointment, he endorsed labile blood pressures despite antihypertensive compliance. He was monitoring at home with a wrist cuff. He complained of sciatic nerve pain mostly radiating distally to his left foot. He had occasional dizziness and shortness of breath when bending over, more frequent with his daily work. However he noted overall improvement in his symptoms since his stress test in 12/2021. His hydralazine  had recent been increased, and his blood pressure was well controlled in the office. As he wasn't sure if his machine was accurate given labile readings, he wore a 24 hr blood pressure monitor 12/2022 showing overall average blood pressure 154/80 with overall average HR of 79 bpm. Blood pressures were uncontrolled during all periods. He was instructed to increase his hydralazine  to 100 mg TID. Renal artery dopplers 11/2022 showed  1-59% stenosis of the left renal artery, no evidence of stenosis in the right. Renin/aldosterone, and TSH levels were normal. Echo 11/2022 revealed LVEF 65-70%, normal diastolic parameters, no significant valvular abnormalities. He also endorsed snoring and his wife had previously noted disordered breathing. He had a sleep study 12/16/2022 that showed severe OSA and he was recommended for CPAP. In 12/2022 he followed up with Alfred Finder, NP and his blood pressure was 119/62. It was noted that he had obtained a new BP cuff and was not yet using it routinely. He denied recurrent shortness of breath.   At his appointment 04/2023 his wife noted that he had exertional dyspnea. Given that his recent stress and echo were unremarkable, no further testing occurred.  He was encouraged to use his CPAP more consistently.  At his visit 10/2023 blood pressure was well-controlled.  He saw Alfred Finder, NP 06/2024 and noted some improvement in his exertional dyspnea.  He was using his CPAP intermittently.  He had a nuclear stress 04/2024 which revealed evidence of prior inferior infarct but no ischemia.  LVEF 64%.  Carotid Dopplers 10/2024 revealed 40-59% bilateral stenosis, increased from prior.  Echo 07/2024 revealed LVEF 65-70% with normal diastolic function.  He had aortic valve sclerosis without stenosis.  Renal Dopplers at that time were negative but did reveal bilateral renal cysts.  Discussed the use of AI scribe software for clinical note transcription with the patient, who gave verbal consent to proceed.  History of Present Illness [name] has a history of carotid artery disease with a recent ultrasound showing 40-59% blockage in both carotid arteries. He is on  rosuvastatin  for cholesterol management and Eliquis  twice daily to prevent blood clots. His diet includes both healthy options like fish and vegetables, as well as less healthy choices like hamburgers and hot dogs.  His blood pressure is well controlled  with hydralazine , losartan , and metoprolol . This combination has been effective in maintaining his blood pressure at a good level.  He is on Farxiga  for diabetes management.  He experiences intermittent pain in his left hip during movement and work activities, described as slight and resolving when seated. He has an upcoming appointment with an orthopedic specialist for further evaluation. He has been engaging in physical therapy or home exercises with some benefit, although the pain persists intermittently.  He experiences a sensation in his left leg, described as a 'little flammulation' that builds up but resolves quickly after prolonged standing or activity. No significant pain in the left leg is reported, aside from the hip discomfort.  He denies smoking and drinking alcohol and tries to maintain an active lifestyle, incorporating exercise into his daily routine, including squats and weight lifting.   Prior Antihypertensives: HCTZ - shortness of breath, dizziness  Past Medical History:  Diagnosis Date   Diabetes mellitus    Hyperlipidemia    Hypertension    OSA on CPAP 11/10/2022   Prostate cancer (HCC) 12/12/2005   Dr Renda   Snoring 11/10/2022    Past Surgical History:  Procedure Laterality Date   COLONOSCOPY  2004 & 2014   Negative ;[facility]   EYE SURGERY  09/06/2022   Cataract surgery   INGUINAL HERNIA REPAIR     PROSTATECTOMY  04/11/2006   Dr Renda   UMBILICAL HERNIA REPAIR      Current Medications: Current Meds  Medication Sig   apixaban  (ELIQUIS ) 5 MG TABS tablet Take 1 tablet (5 mg total) by mouth 2 (two) times daily.   Cholecalciferol (VITAMIN D3) 1000 UNITS CAPS Take 1,000 Units by mouth daily.   dapagliflozin  propanediol (FARXIGA ) 10 MG TABS tablet Take 1 tablet (10 mg total) by mouth daily.   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  Dx: P17.538   gabapentin  (NEURONTIN ) 100 MG capsule TAKE 2  CAPSULES(200 MG) BY MOUTH AT BEDTIME   glimepiride  (AMARYL ) 1 MG tablet TAKE 1 TABLET(1 MG) BY MOUTH DAILY WITH BREAKFAST   hydrALAZINE  (APRESOLINE ) 100 MG tablet TAKE 1 TABLET(100 MG) BY MOUTH THREE TIMES DAILY   Lancets (ONETOUCH ULTRASOFT) lancets use as directed   losartan  (COZAAR ) 100 MG tablet TAKE 1 TABLET(100 MG) BY MOUTH DAILY   metoprolol  tartrate (LOPRESSOR ) 25 MG tablet Take 1 tablet (25 mg total) by mouth 2 (two) times daily.   Omega-3 Fatty Acids (FISH OIL PO) Take 1,000 mg by mouth daily.   ONE TOUCH ULTRA TEST test strip use as directed   rosuvastatin  (CRESTOR ) 20 MG tablet TAKE 1 TABLET(20 MG) BY MOUTH DAILY     Allergies:   Hydrochlorothiazide  and Metformin  and related   Social History   Socioeconomic History   Marital status: Married    Spouse name: Vertie   Number of children: 1   Years of education: Not on file   Highest education level: Not on file  Occupational History   Occupation: Full time worker  Tobacco Use   Smoking status: Never   Smokeless tobacco: Never  Vaping Use   Vaping status: Never Used  Substance and Sexual Activity   Alcohol use: No   Drug use: No  Sexual activity: Not on file  Other Topics Concern   Not on file  Social History Narrative   Lives with wife/2025   Social Drivers of Health   Financial Resource Strain: Low Risk  (04/24/2024)   Overall Financial Resource Strain (CARDIA)    Difficulty of Paying Living Expenses: Not hard at all  Food Insecurity: No Food Insecurity (04/24/2024)   Hunger Vital Sign    Worried About Running Out of Food in the Last Year: Never true    Ran Out of Food in the Last Year: Never true  Transportation Needs: No Transportation Needs (04/24/2024)   PRAPARE - Administrator, Civil Service (Medical): No    Lack of Transportation (Non-Medical): No  Physical Activity: Sufficiently Active (04/24/2024)   Exercise Vital Sign    Days of Exercise per Week: 5 days    Minutes of Exercise per  Session: 40 min  Stress: No Stress Concern Present (04/24/2024)   [employer] of Occupational Health - Occupational Stress Questionnaire    Feeling of Stress : Not at all  Social Connections: Moderately Isolated (04/24/2024)   Social Connection and Isolation Panel    Frequency of Communication with Friends and Family: Twice a week    Frequency of Social Gatherings with Friends and Family: Never    Attends Religious Services: More than 4 times per year    Active Member of [employer] or Organizations: No    Attends Engineer, Structural: Never    Marital Status: Married     Family History: The patient's family history includes Alzheimer's disease in his father and mother; Diabetes in his brother and father; Heart attack in his brother and maternal aunt; Heart attack (age of onset: 20) in his sister; Heart attack (age of onset: 73) in his mother; Heart disease in his brother; Hypertension in his father and mother. There is no history of Stroke, Colon cancer, Esophageal cancer, or Stomach cancer.  ROS:   Please see the history of present illness.    (+) Fatigue (+) Shortness of breath (+) Lower back pain All other systems reviewed and are negative.  EKGs/Labs/Other Studies Reviewed:    Lexiscan  Myoview  04/2024:    Findings are consistent with infarction. The study is intermediate risk due to presence of TID and perfusion defect.   No ST deviation was noted.   LV perfusion is abnormal. There is no evidence of ischemia. There is evidence of infarction. Defect 1: There is a medium defect with moderate reduction in uptake present in the apical to basal inferior location(s) that is fixed. There is abnormal wall motion in the defect area. Consistent with infarction and peri-infarct ischemia.   Left ventricular function is normal. Nuclear stress EF: 64%. The left ventricular ejection fraction is normal (55-65%). End diastolic cavity size is normal. End systolic cavity size is normal.  Evidence of transient ischemic dilation (TID) noted.   CT images were obtained for attenuation correction and were examined for the presence of coronary calcium  when appropriate.   Coronary calcium  was present on the attenuation correction CT images. Mild coronary calcifications were present. Coronary calcifications were present in the right coronary artery distribution(s).   Prior study not available for comparison.   24 HR Blood Pressure Monitor  01/03/2023: 24 Hour Ambulatory Blood Pressure Report   Overall Average BP: 153/80 Overall Average HR: 79   Awake Average BP: 152/80 Awake Average HR:80  Asleep Average BP: 156/79 Asleep Average HR: 75  White Coat Period: 143/84,  Heart rate 69   Impressions: Uncontrolled hypertension during all periods  Echo  12/08/2022:  1. Left ventricular ejection fraction, by estimation, is 65 to 70%. Left  ventricular ejection fraction by PLAX is 67 %. The left ventricle has  normal function. The left ventricle has no regional wall motion  abnormalities. Left ventricular diastolic  parameters were normal.   2. Right ventricular systolic function is normal. The right ventricular  size is normal. Tricuspid regurgitation signal is inadequate for assessing  PA pressure.   3. The mitral valve is abnormal. Trivial mitral valve regurgitation.   4. The aortic valve is tricuspid. Aortic valve regurgitation is not  visualized.   5. The inferior vena cava is normal in size with greater than 50%  respiratory variability, suggesting right atrial pressure of 3 mmHg.   Comparison(s): No prior Echocardiogram.   Renal Artery Doppler  12/08/2022: Summary:  Renal:    Right: Abnormal right Resistive Index. Normal cortical thickness of         right kidney. Abnormal size for the right kidney. RRV flow         present. No evidence of right renal artery stenosis.  Left:  1-59% stenosis of the left renal artery. Abnormal left         Resisitve Index. Normal  cortical thickness of the left         kidney. Abnormal size for the left kidney. LRV flow present.  Mesenteric:  Areas of limited visceral study include right kidney size, left kidney size and mesenteric arteries.   ETT  12/31/2021:   No ST deviation was noted.   ETT with fair exercise tolerance (9:00); no chest pain; hypertensive blood pressure response; no diagnostic ST changes; frequent PVCs and 7 beats nonsustained ventricular tachycardia with exercise; negative adequate ETT.  Bilateral Carotid Dopplers  12/17/2021: Summary:  Right Carotid: Velocities in the right ICA are consistent with a 1-39%  stenosis.   Left Carotid: Velocities in the left ICA are consistent with a 1-39%  stenosis.               Non-hemodynamically significant plaque <50% noted in the  CCA.   Vertebrals: Bilateral vertebral arteries demonstrate antegrade flow.  Subclavians: Normal flow hemodynamics were seen in bilateral subclavian               arteries.    EKG:  EKG is personally reviewed. 10/25/2023: Sinus rhythm.  Rate 79 bpm.  PACs.  Cannot rule out prior lateral infarct. 05/03/2023:  EKG was not ordered. 11/10/2022: Sinus tachycardia. PACs. Rate 103 bpm.  Recent Labs: 03/15/2024: BNP 7.7 07/20/2024: ALT 18; BUN 20; Creatinine, Ser 1.72; Hemoglobin 12.9; Platelets 196; Potassium 4.2; Sodium 141   Recent Lipid Panel    Component Value Date/Time   CHOL 117 05/17/2024 1208   TRIG 78.0 05/17/2024 1208   HDL 41.70 05/17/2024 1208   CHOLHDL 3 05/17/2024 1208   VLDL 15.6 05/17/2024 1208   LDLCALC 60 05/17/2024 1208    Physical Exam:    VS:  BP 118/60 (BP Location: Left Arm, Patient Position: Sitting, Cuff Size: Normal)   Pulse 85   Ht 6' 1 (1.854 m)   Wt 221 lb (100.2 kg)   SpO2 97%   BMI 29.16 kg/m  , BMI Body mass index is 29.16 kg/m. GENERAL:  Well appearing HEENT: Pupils equal round and reactive, fundi not visualized, oral mucosa unremarkable NECK:  No jugular venous distention, waveform  within normal limits, carotid upstroke brisk  and symmetric, R carotid bruit, no thyromegaly LUNGS:  Clear to auscultation bilaterally HEART:  RRR with frequent ectopy.  PMI not displaced or sustained,S1 and S2 within normal limits, no S3, no S4, no clicks, no rubs, no murmurs ABD:  Flat, positive bowel sounds normal in frequency in pitch, no bruits, no rebound, no guarding, no midline pulsatile mass, no hepatomegaly, no splenomegaly EXT:  2 plus pulses throughout, no edema, no cyanosis no clubbing SKIN:  No rashes no nodules NEURO:  Cranial nerves II through XII grossly intact, motor grossly intact throughout PSYCH:  Cognitively intact, oriented to person place and time   ASSESSMENT/PLAN:     Assessment & Plan # Bilateral carotid artery stenosis Progression to mild stenosis with 40-59% blockage bilaterally. Asymptomatic due to blockage below 70-80%. Risk factors include cholesterol and A1c levels, well-controlled. Goal to prevent progression and stroke. - Increased rosuvastatin  to 40 mg daily. - Recheck cholesterol levels around Valentine's Day with lipid panel, comprehensive metabolic panel, and LP(a) marker. - Repeat carotid ultrasound in one year.  # Mixed hyperlipidemia Cholesterol levels well-controlled but require further reduction to prevent stenosis progression. - Increased rosuvastatin  to 40 mg daily.  LDL goal <55 given progression of carotid disease with LDL 60. - Recheck cholesterol levels around Valentine's Day with lipid panel, comprehensive metabolic panel, and LP(a) marker.  # Essential hypertension Blood pressure well-controlled with current medication regimen. - Continue current antihypertensive regimen.  Hydralazine , losartan , and metoprolol . - Continue exercise.    F/u 1 year  Screening for Secondary Hypertension:     11/10/2022   11:53 AM  Causes  Drugs/Herbals Screened     - Comments occasional soda, no EtOH, limits salt with cooking but eats out.  No  NSAIDs  Renovascular HTN Screened     - Comments check renal Doppler  Sleep Apnea Screened     - Comments check sleep study  Thyroid  Disease Screened  Hyperaldosteronism Screened  Pheochromocytoma N/A  Coarctation of the Aorta Screened     - Comments BP symmetric  Compliance Screened     - Comments Unclear about whether he is taking amloidpine    Relevant Labs/Studies:    Latest Ref Rng & Units 07/20/2024    6:06 AM 05/17/2024   12:08 PM 03/15/2024   10:56 AM  Basic Labs  Sodium 135 - 145 mmol/L 141  140  140   Potassium 3.5 - 5.1 mmol/L 4.2  4.2  4.8   Creatinine 0.61 - 1.24 mg/dL 8.27  8.16  8.11        Latest Ref Rng & Units 11/17/2022    9:53 AM 10/09/2020    8:30 AM  Thyroid    TSH 0.450 - 4.500 uIU/mL 0.815  0.83        Latest Ref Rng & Units 11/17/2022    9:53 AM  Renin/Aldosterone   Aldosterone 0.0 - 30.0 ng/dL <8.9   Aldos/Renin Ratio 0.0 - 30.0 <.4              08/02/2024    9:35 AM  Renovascular   Renal Artery US  Completed Yes      Medication Adjustments/Labs and Tests Ordered: Current medicines are reviewed at length with the patient today.  Concerns regarding medicines are outlined above.   No orders of the defined types were placed in this encounter.  No orders of the defined types were placed in this encounter.   Signed, Annabella Scarce, MD  11/11/2024 8:33 AM    [facility]

## 2024-11-20 ENCOUNTER — Telehealth: Payer: Self-pay | Admitting: Cardiovascular Disease

## 2024-11-20 NOTE — Telephone Encounter (Signed)
 Spoke w patient's wife.  She has requested an itemized bill for testing done on 08/02/24 and 10/23/24.  Adv I would send billing a message to provide that information.  They do not use mychart so would prefer a phone call.  She also requested, since not accessing my chart, that the reports be mailed to her.  I have printed and placed to home address.

## 2024-11-20 NOTE — Telephone Encounter (Signed)
 Wife is needing a copy of patient ultrasound from aug-dec to be able to send it to the insurance. Please advise

## 2024-11-21 ENCOUNTER — Encounter: Payer: Self-pay | Admitting: Internal Medicine

## 2024-11-21 NOTE — Patient Instructions (Addendum)
 Blood work was ordered.       Medications changes include :   None    A referral was ordered and someone will call you to schedule an appointment.     Return in about 6 months (around 05/23/2025) for follow up.    Health Maintenance, Male Adopting a healthy lifestyle and getting preventive care are important in promoting health and wellness. Ask your health care provider about: The right schedule for you to have regular tests and exams. Things you can do on your own to prevent diseases and keep yourself healthy. What should I know about diet, weight, and exercise? Eat a healthy diet  Eat a diet that includes plenty of vegetables, fruits, low-fat dairy products, and lean protein. Do not eat a lot of foods that are high in solid fats, added sugars, or sodium. Maintain a healthy weight Body mass index (BMI) is a measurement that can be used to identify possible weight problems. It estimates body fat based on height and weight. Your health care provider can help determine your BMI and help you achieve or maintain a healthy weight. Get regular exercise Get regular exercise. This is one of the most important things you can do for your health. Most adults should: Exercise for at least 150 minutes each week. The exercise should increase your heart rate and make you sweat (moderate-intensity exercise). Do strengthening exercises at least twice a week. This is in addition to the moderate-intensity exercise. Spend less time sitting. Even light physical activity can be beneficial. Watch cholesterol and blood lipids Have your blood tested for lipids and cholesterol at 77 years of age, then have this test every 5 years. You may need to have your cholesterol levels checked more often if: Your lipid or cholesterol levels are high. You are older than 78 years of age. You are at high risk for heart disease. What should I know about cancer screening? Many types of cancers can be detected  early and may often be prevented. Depending on your health history and family history, you may need to have cancer screening at various ages. This may include screening for: Colorectal cancer. Prostate cancer. Skin cancer. Lung cancer. What should I know about heart disease, diabetes, and high blood pressure? Blood pressure and heart disease High blood pressure causes heart disease and increases the risk of stroke. This is more likely to develop in people who have high blood pressure readings or are overweight. Talk with your health care provider about your target blood pressure readings. Have your blood pressure checked: Every 3-5 years if you are 13-29 years of age. Every year if you are 53 years old or older. If you are between the ages of 55 and 84 and are a current or former smoker, ask your health care provider if you should have a one-time screening for abdominal aortic aneurysm (AAA). Diabetes Have regular diabetes screenings. This checks your fasting blood sugar level. Have the screening done: Once every three years after age 61 if you are at a normal weight and have a low risk for diabetes. More often and at a younger age if you are overweight or have a high risk for diabetes. What should I know about preventing infection? Hepatitis B If you have a higher risk for hepatitis B, you should be screened for this virus. Talk with your health care provider to find out if you are at risk for hepatitis B infection. Hepatitis C Blood testing is recommended  for: Everyone born from [birth year] through 1964. Anyone with known risk factors for hepatitis C. Sexually transmitted infections (STIs) You should be screened each year for STIs, including gonorrhea and chlamydia, if: You are sexually active and are younger than 77 years of age. You are older than 77 years of age and your health care provider tells you that you are at risk for this type of infection. Your sexual activity has changed since  you were last screened, and you are at increased risk for chlamydia or gonorrhea. Ask your health care provider if you are at risk. Ask your health care provider about whether you are at high risk for HIV. Your health care provider may recommend a prescription medicine to help prevent HIV infection. If you choose to take medicine to prevent HIV, you should first get tested for HIV. You should then be tested every 3 months for as long as you are taking the medicine. Follow these instructions at home: Alcohol use Do not drink alcohol if your health care provider tells you not to drink. If you drink alcohol: Limit how much you have to 0-2 drinks a day. Know how much alcohol is in your drink. In the [place] one drink equals one 12 oz bottle of beer (355 mL), one 5 oz glass of wine (148 mL), or one 1 oz glass of hard liquor (44 mL). Lifestyle Do not use any products that contain nicotine or tobacco. These products include cigarettes, chewing tobacco, and vaping devices, such as e-cigarettes. If you need help quitting, ask your health care provider. Do not use street drugs. Do not share needles. Ask your health care provider for help if you need support or information about quitting drugs. General instructions Schedule regular health, dental, and eye exams. Stay current with your vaccines. Tell your health care provider if: You often feel depressed. You have ever been abused or do not feel safe at home. Summary Adopting a healthy lifestyle and getting preventive care are important in promoting health and wellness. Follow your health care provider's instructions about healthy diet, exercising, and getting tested or screened for diseases. Follow your health care provider's instructions on monitoring your cholesterol and blood pressure. This information is not intended to replace advice given to you by your health care provider. Make sure you discuss any questions you have with your health care  provider. Document Revised: 04/19/2021 Document Reviewed: 04/19/2021 Elsevier Patient Education  2024 [employer].

## 2024-11-21 NOTE — Telephone Encounter (Signed)
 It is documented in transaction notes that the requested itemized billing was provided yesterday, to be mailed to the patient.

## 2024-11-21 NOTE — Progress Notes (Unsigned)
 Subjective:    Patient ID: Alfred Foster, male    DOB: 31-Aug-1946, 78 y.o.   MRN: [id number]     HPI Alfred Foster is here for a physical exam and his chronic medical problems.  Still having intermittent left lower back pain and left leg pain.  It is better now.    Not using CPAP nightly   Medications and allergies reviewed with patient and updated if appropriate.  Medications Ordered Prior to Encounter[1]  Review of Systems  Constitutional:  Negative for fever.  Eyes:  Negative for visual disturbance.  Respiratory:  Positive for cough (sometimes). Negative for shortness of breath and wheezing.   Cardiovascular:  Positive for leg swelling (controlled). Negative for chest pain and palpitations.  Gastrointestinal:  Negative for abdominal pain, blood in stool, constipation and diarrhea.       No gerd  Genitourinary:  Negative for difficulty urinating, dysuria and hematuria.  Musculoskeletal:  Positive for arthralgias (right shoulder, hands) and back pain (left lower back with radiculopaty - intermittent).  Skin:  Negative for rash.  Neurological:  Negative for light-headedness, numbness and headaches.  Psychiatric/Behavioral:  Negative for dysphoric mood. The patient is not nervous/anxious.        Objective:   Vitals:   11/22/24 0954  BP: 118/72  Pulse: 79  Temp: 98.5 F (36.9 C)  SpO2: 99%   Filed Weights   11/22/24 0954  Weight: 221 lb (100.2 kg)   Body mass index is 29.16 kg/m.  BP Readings from Last 3 Encounters:  11/22/24 118/72  11/11/24 118/60  07/26/24 124/76    Wt Readings from Last 3 Encounters:  11/22/24 221 lb (100.2 kg)  11/11/24 221 lb (100.2 kg)  07/26/24 218 lb (98.9 kg)      Physical Exam Constitutional: He appears well-developed and well-nourished. No distress.  HENT:  Head: Normocephalic and atraumatic.  Right Ear: External ear normal.  Normal ear canal and TM Left Ear: External ear normal.  Ear canal obstructed with cerumen, TM not  visualized Mouth/Throat: Oropharynx is clear and moist. Eyes: Conjunctivae and EOM are normal.  Neck: Neck supple. No tracheal deviation present. No thyromegaly present.  No carotid bruit  Cardiovascular: Normal rate, regular rhythm, normal heart sounds and intact distal pulses.   No murmur heard.  Mild bilateral lower extremity edema. Pulmonary/Chest: Effort normal and breath sounds normal. No respiratory distress. He has no wheezes. He has no rales.  Abdominal: Soft. He exhibits no distension. There is no tenderness.  Genitourinary: deferred  Lymphadenopathy:   He has no cervical adenopathy.  Skin: Skin is warm and dry. He is not diaphoretic.  Psychiatric: He has a normal mood and affect. His behavior is normal.         Assessment & Plan:   Physical exam: Screening blood work  ordered Exercise   very active, some weights Weight  is ok Substance abuse   none   Reviewed recommended immunizations.   Health Maintenance  Topic Date Due   OPHTHALMOLOGY EXAM  07/15/2023   HEMOGLOBIN A1C  11/16/2024   Diabetic kidney evaluation - Urine ACR  02/28/2025   Medicare Annual Wellness (AWV)  04/24/2025   FOOT EXAM  07/09/2025   Diabetic kidney evaluation - eGFR measurement  07/20/2025   DTaP/Tdap/Td (3 - Td or Tdap) 05/15/2033   Pneumococcal Vaccine: 50+ Years  Completed   Influenza Vaccine  Completed   Hepatitis C Screening  Completed   Zoster Vaccines- Shingrix  Completed   Meningococcal  B Vaccine  Aged Out   Colonoscopy  Discontinued   COVID-19 Vaccine  Discontinued   Has eye appt scheduled.     See Problem List for Assessment and Plan of chronic medical problems.      [1]  Current Outpatient Medications on File Prior to Visit  Medication Sig Dispense Refill   apixaban  (ELIQUIS ) 5 MG TABS tablet Take 1 tablet (5 mg total) by mouth 2 (two) times daily. 180 tablet 3   Cholecalciferol (VITAMIN D3) 1000 UNITS CAPS Take 1,000 Units by mouth daily.     dapagliflozin   propanediol (FARXIGA ) 10 MG TABS tablet Take 1 tablet (10 mg total) by mouth daily. 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 180 tablet 3   Omega-3 Fatty Acids (FISH OIL PO) Take 1,000 mg by mouth daily.     ONE TOUCH ULTRA TEST test strip use as directed 100 each 1   rosuvastatin  (CRESTOR ) 40 MG tablet Take 1 tablet (40 mg total) by mouth daily. 90 tablet 3   No current facility-administered medications on file prior to visit.

## 2024-11-22 ENCOUNTER — Ambulatory Visit: Admitting: Internal Medicine

## 2024-11-22 VITALS — BP 118/72 | HR 79 | Temp 98.5°F | Ht 73.0 in | Wt 221.0 lb

## 2024-11-22 DIAGNOSIS — E1140 Type 2 diabetes mellitus with diabetic neuropathy, unspecified: Secondary | ICD-10-CM

## 2024-11-22 DIAGNOSIS — Z0000 Encounter for general adult medical examination without abnormal findings: Secondary | ICD-10-CM

## 2024-11-22 DIAGNOSIS — N1832 Chronic kidney disease, stage 3b: Secondary | ICD-10-CM

## 2024-11-22 DIAGNOSIS — E1159 Type 2 diabetes mellitus with other circulatory complications: Secondary | ICD-10-CM

## 2024-11-22 DIAGNOSIS — G4733 Obstructive sleep apnea (adult) (pediatric): Secondary | ICD-10-CM

## 2024-11-22 DIAGNOSIS — I480 Paroxysmal atrial fibrillation: Secondary | ICD-10-CM

## 2024-11-22 DIAGNOSIS — E1169 Type 2 diabetes mellitus with other specified complication: Secondary | ICD-10-CM

## 2024-11-22 DIAGNOSIS — E1142 Type 2 diabetes mellitus with diabetic polyneuropathy: Secondary | ICD-10-CM

## 2024-11-22 DIAGNOSIS — I82562 Chronic embolism and thrombosis of left calf muscular vein: Secondary | ICD-10-CM

## 2024-11-22 LAB — MICROALBUMIN / CREATININE URINE RATIO
Creatinine,U: 146.9 mg/dL
Microalb Creat Ratio: 9.9 mg/g (ref 0.0–30.0)
Microalb, Ur: 1.5 mg/dL (ref 0.0–1.9)

## 2024-11-22 LAB — COMPREHENSIVE METABOLIC PANEL WITH GFR
ALT: 18 U/L (ref 0–53)
AST: 23 U/L (ref 0–37)
Albumin: 4.1 g/dL (ref 3.5–5.2)
Alkaline Phosphatase: 74 U/L (ref 39–117)
BUN: 20 mg/dL (ref 6–23)
CO2: 24 meq/L (ref 19–32)
Calcium: 9.2 mg/dL (ref 8.4–10.5)
Chloride: 110 meq/L (ref 96–112)
Creatinine, Ser: 1.78 mg/dL — ABNORMAL HIGH (ref 0.40–1.50)
GFR: 36.38 mL/min — ABNORMAL LOW (ref >60.00)
Glucose, Bld: 106 mg/dL — ABNORMAL HIGH (ref 70–99)
Potassium: 4.5 meq/L (ref 3.5–5.1)
Sodium: 142 meq/L (ref 135–145)
Total Bilirubin: 0.7 mg/dL (ref 0.2–1.2)
Total Protein: 6.8 g/dL (ref 6.0–8.3)

## 2024-11-22 LAB — TSH: TSH: 1.06 u[IU]/mL (ref 0.35–5.50)

## 2024-11-22 LAB — CBC
HCT: 37.8 % — ABNORMAL LOW (ref 39.0–52.0)
Hemoglobin: 12.9 g/dL — ABNORMAL LOW (ref 13.0–17.0)
MCHC: 34.2 g/dL (ref 30.0–36.0)
MCV: 94.4 fl (ref 78.0–100.0)
Platelets: 235 K/uL (ref 150.0–400.0)
RBC: 4 Mil/uL — ABNORMAL LOW (ref 4.22–5.81)
RDW: 14.1 % (ref 11.5–15.5)
WBC: 5.9 K/uL (ref 4.0–10.5)

## 2024-11-22 LAB — VITAMIN D 25 HYDROXY (VIT D DEFICIENCY, FRACTURES): VITD: 38.55 ng/mL (ref 30.00–100.00)

## 2024-11-22 LAB — LIPID PANEL
Cholesterol: 83 mg/dL (ref 0–200)
HDL: 33.9 mg/dL — ABNORMAL LOW (ref >39.00)
LDL Cholesterol: 41 mg/dL (ref 0–99)
NonHDL: 49.38
Total CHOL/HDL Ratio: 2
Triglycerides: 44 mg/dL (ref 0.0–149.0)
VLDL: 8.8 mg/dL (ref 0.0–40.0)

## 2024-11-22 LAB — HEMOGLOBIN A1C: Hgb A1c MFr Bld: 6.3 % (ref 4.6–6.5)

## 2024-11-22 MED ORDER — GABAPENTIN 100 MG PO CAPS: 200.0000 mg | ORAL_CAPSULE | Freq: Two times a day (BID) | ORAL | 2 refills | Status: AC

## 2024-11-22 MED ORDER — GLIMEPIRIDE 1 MG PO TABS: ORAL_TABLET | ORAL | 1 refills | Status: AC

## 2024-11-22 NOTE — Assessment & Plan Note (Signed)
Chronic Continue gabapentin 200 mg nightly

## 2024-11-22 NOTE — Assessment & Plan Note (Addendum)
 Chronic Not currently CPAP nightly Stressed the importance of using a CPAP nightly-discussed not treating his sleep apnea may worsen his kidney function, cause additional heart problems including heart failure, increases risk of dementia

## 2024-11-22 NOTE — Assessment & Plan Note (Addendum)
 Chronic Chronic clot Continue lifelong anticoagulation Continue compression socks Continue Eliquis  5 mg twice daily given atrial fibrillation CBC, CMP

## 2024-11-22 NOTE — Assessment & Plan Note (Signed)
 Chronic Blood pressure controlled Continue taking metoprolol  25 mg bid, losartan  100 mg daily, hydralazine  to 100 mg 3 times daily Monitor BP and heart rate at home CMP, CBC

## 2024-11-22 NOTE — Assessment & Plan Note (Signed)
 Chronic Regular exercise and healthy diet encouraged Check lipid panel, CMP, TSH Continue Crestor 40 mg daily

## 2024-11-22 NOTE — Assessment & Plan Note (Signed)
 Chronic Associated with diabetic neuropathy, CKD stage 3b, hypertension, hyperlipidemia Lab Results  Component Value Date   HGBA1C 6.3 05/17/2024   Sugars controlled Testing sugars 1 times a day Continue glimepiride  1 mg daily, Farxiga  10 mg daily Stressed regular exercise, diabetic diet

## 2024-11-22 NOTE — Assessment & Plan Note (Signed)
 Chronic Continue Eliquis  5 mg twice daily, metoprolol  25 mg twice daily CBC, CMP, TSH

## 2024-11-22 NOTE — Assessment & Plan Note (Addendum)
 Chronic Stage 3b Stressed good blood pressure, sugar control Stressed that he needs to start using his CPAP nightly Encouraged weight loss No NSAIDs Continue increased water intake Continue Farxiga  10 mg daily CBC, CMP

## 2024-11-23 ENCOUNTER — Ambulatory Visit: Payer: Self-pay | Admitting: Internal Medicine

## 2024-12-02 ENCOUNTER — Other Ambulatory Visit (HOSPITAL_BASED_OUTPATIENT_CLINIC_OR_DEPARTMENT_OTHER): Payer: Self-pay | Admitting: Cardiovascular Disease

## 2024-12-07 ENCOUNTER — Other Ambulatory Visit: Payer: Self-pay | Admitting: Internal Medicine

## 2025-01-02 ENCOUNTER — Telehealth: Payer: Self-pay

## 2025-01-02 NOTE — Telephone Encounter (Signed)
 Copied from CRM #[id number]. Topic: Clinical - Medical Advice >> Jan 02, 2025  3:59 PM Terri G wrote: Reason for CRM: Patient is wanting to know when the last time she had her RSV shot

## 2025-01-02 NOTE — Telephone Encounter (Signed)
Spoke with patient's spouse today 

## 2025-01-10 ENCOUNTER — Ambulatory Visit: Admitting: Podiatry

## 2025-02-14 ENCOUNTER — Ambulatory Visit: Admitting: Podiatry

## 2025-04-25 ENCOUNTER — Ambulatory Visit

## 2025-05-23 ENCOUNTER — Ambulatory Visit: Admitting: Internal Medicine

## 2025-10-13 ENCOUNTER — Encounter (HOSPITAL_BASED_OUTPATIENT_CLINIC_OR_DEPARTMENT_OTHER)
